# Patient Record
Sex: Male | Born: 1945
Health system: Southern US, Community
[De-identification: ages and names within clinical notes are randomized; demographics above are authoritative.]

## PROBLEM LIST (undated history)

## (undated) DIAGNOSIS — H269 Unspecified cataract: Secondary | ICD-10-CM

## (undated) DIAGNOSIS — M720 Palmar fascial fibromatosis [Dupuytren]: Secondary | ICD-10-CM

## (undated) DIAGNOSIS — I10 Essential (primary) hypertension: Secondary | ICD-10-CM

## (undated) DIAGNOSIS — K219 Gastro-esophageal reflux disease without esophagitis: Secondary | ICD-10-CM

## (undated) DIAGNOSIS — K449 Diaphragmatic hernia without obstruction or gangrene: Secondary | ICD-10-CM

## (undated) DIAGNOSIS — T7840XA Allergy, unspecified, initial encounter: Secondary | ICD-10-CM

## (undated) DIAGNOSIS — D751 Secondary polycythemia: Secondary | ICD-10-CM

## (undated) HISTORY — DX: Diaphragmatic hernia without obstruction or gangrene: K44.9

## (undated) HISTORY — PX: COLONOSCOPY: SHX174

## (undated) HISTORY — DX: Palmar fascial fibromatosis (dupuytren): M72.0

## (undated) HISTORY — DX: Allergy, unspecified, initial encounter: T78.40XA

## (undated) HISTORY — DX: Essential (primary) hypertension: I10

## (undated) HISTORY — DX: Gastro-esophageal reflux disease without esophagitis: K21.9

## (undated) HISTORY — PX: POLYPECTOMY: SHX149

## (undated) HISTORY — DX: Unspecified cataract: H26.9

## (undated) HISTORY — DX: Secondary polycythemia: D75.1

## (undated) HISTORY — PX: DUPUYTREN CONTRACTURE RELEASE: SHX1478

## (undated) HISTORY — PX: OTHER SURGICAL HISTORY: SHX169

---

## 1999-05-29 ENCOUNTER — Other Ambulatory Visit: Admission: RE | Admit: 1999-05-29 | Discharge: 1999-05-29 | Payer: Self-pay | Admitting: Gastroenterology

## 1999-05-29 ENCOUNTER — Encounter (INDEPENDENT_AMBULATORY_CARE_PROVIDER_SITE_OTHER): Payer: Self-pay

## 2003-01-31 ENCOUNTER — Ambulatory Visit (HOSPITAL_COMMUNITY): Admission: RE | Admit: 2003-01-31 | Discharge: 2003-01-31 | Payer: Self-pay | Admitting: Hematology & Oncology

## 2003-12-23 ENCOUNTER — Ambulatory Visit: Payer: Self-pay | Admitting: Hematology & Oncology

## 2003-12-28 ENCOUNTER — Ambulatory Visit: Payer: Self-pay | Admitting: Internal Medicine

## 2004-01-05 ENCOUNTER — Ambulatory Visit: Payer: Self-pay | Admitting: Internal Medicine

## 2004-03-08 ENCOUNTER — Ambulatory Visit: Payer: Self-pay | Admitting: Internal Medicine

## 2004-04-20 ENCOUNTER — Ambulatory Visit: Payer: Self-pay | Admitting: Hematology & Oncology

## 2004-05-08 ENCOUNTER — Ambulatory Visit: Payer: Self-pay | Admitting: Internal Medicine

## 2004-06-15 ENCOUNTER — Ambulatory Visit: Payer: Self-pay | Admitting: Hematology & Oncology

## 2004-07-09 ENCOUNTER — Ambulatory Visit: Payer: Self-pay | Admitting: Internal Medicine

## 2004-08-06 ENCOUNTER — Ambulatory Visit: Payer: Self-pay | Admitting: Gastroenterology

## 2004-08-13 ENCOUNTER — Ambulatory Visit: Payer: Self-pay | Admitting: Internal Medicine

## 2004-08-20 ENCOUNTER — Ambulatory Visit: Payer: Self-pay | Admitting: Gastroenterology

## 2004-08-20 ENCOUNTER — Encounter (INDEPENDENT_AMBULATORY_CARE_PROVIDER_SITE_OTHER): Payer: Self-pay | Admitting: Specialist

## 2004-08-20 ENCOUNTER — Encounter: Payer: Self-pay | Admitting: Internal Medicine

## 2005-01-18 ENCOUNTER — Ambulatory Visit: Payer: Self-pay | Admitting: Internal Medicine

## 2005-01-21 ENCOUNTER — Ambulatory Visit: Payer: Self-pay | Admitting: Hematology & Oncology

## 2005-01-25 ENCOUNTER — Ambulatory Visit: Payer: Self-pay | Admitting: Internal Medicine

## 2005-03-21 ENCOUNTER — Ambulatory Visit: Payer: Self-pay | Admitting: Internal Medicine

## 2005-03-29 ENCOUNTER — Ambulatory Visit: Payer: Self-pay | Admitting: Internal Medicine

## 2005-07-12 ENCOUNTER — Ambulatory Visit: Payer: Self-pay | Admitting: Hematology & Oncology

## 2005-07-22 LAB — CBC WITH DIFFERENTIAL/PLATELET
BASO%: 0.4 % (ref 0.0–2.0)
Eosinophils Absolute: 0.2 10*3/uL (ref 0.0–0.5)
MCHC: 34.5 g/dL (ref 32.0–35.9)
MONO#: 0.5 10*3/uL (ref 0.1–0.9)
NEUT#: 4.5 10*3/uL (ref 1.5–6.5)
RBC: 5.12 10*6/uL (ref 4.20–5.71)
RDW: 15.1 % — ABNORMAL HIGH (ref 11.2–14.6)
WBC: 6.3 10*3/uL (ref 4.0–10.0)
lymph#: 1 10*3/uL (ref 0.9–3.3)

## 2005-07-22 LAB — FERRITIN: Ferritin: 40 ng/mL (ref 22–322)

## 2005-08-05 LAB — CBC WITH DIFFERENTIAL/PLATELET
Basophils Absolute: 0 10*3/uL (ref 0.0–0.1)
EOS%: 4.5 % (ref 0.0–7.0)
Eosinophils Absolute: 0.3 10*3/uL (ref 0.0–0.5)
HCT: 44.6 % (ref 38.7–49.9)
HGB: 15.7 g/dL (ref 13.0–17.1)
MCH: 33.3 pg (ref 28.0–33.4)
MONO#: 0.7 10*3/uL (ref 0.1–0.9)
NEUT#: 4.5 10*3/uL (ref 1.5–6.5)
RDW: 14.6 % (ref 11.2–14.6)
WBC: 6.7 10*3/uL (ref 4.0–10.0)
lymph#: 1.1 10*3/uL (ref 0.9–3.3)

## 2005-08-05 LAB — FERRITIN: Ferritin: 35 ng/mL (ref 22–322)

## 2005-11-14 ENCOUNTER — Ambulatory Visit: Payer: Self-pay | Admitting: Hematology & Oncology

## 2005-11-18 LAB — CBC WITH DIFFERENTIAL/PLATELET
Basophils Absolute: 0.1 10*3/uL (ref 0.0–0.1)
Eosinophils Absolute: 0.5 10*3/uL (ref 0.0–0.5)
HCT: 49.3 % (ref 38.7–49.9)
HGB: 17.2 g/dL — ABNORMAL HIGH (ref 13.0–17.1)
MCH: 33.5 pg — ABNORMAL HIGH (ref 28.0–33.4)
MONO#: 0.8 10*3/uL (ref 0.1–0.9)
NEUT#: 4.3 10*3/uL (ref 1.5–6.5)
NEUT%: 61.6 % (ref 40.0–75.0)
RDW: 13.2 % (ref 11.2–14.6)
WBC: 6.9 10*3/uL (ref 4.0–10.0)
lymph#: 1.3 10*3/uL (ref 0.9–3.3)

## 2005-11-20 LAB — CBC WITH DIFFERENTIAL/PLATELET
EOS%: 6.9 % (ref 0.0–7.0)
Eosinophils Absolute: 0.5 10*3/uL (ref 0.0–0.5)
LYMPH%: 20.3 % (ref 14.0–48.0)
MCH: 33 pg (ref 28.0–33.4)
MCV: 91.8 fL (ref 81.6–98.0)
MONO%: 9.9 % (ref 0.0–13.0)
Platelets: 173 10*3/uL (ref 145–400)
RBC: 5.14 10*6/uL (ref 4.20–5.71)
RDW: 12.2 % (ref 11.2–14.6)

## 2005-11-27 LAB — CBC WITH DIFFERENTIAL/PLATELET
BASO%: 0.9 % (ref 0.0–2.0)
Eosinophils Absolute: 0.5 10*3/uL (ref 0.0–0.5)
LYMPH%: 21.3 % (ref 14.0–48.0)
MCHC: 35.2 g/dL (ref 32.0–35.9)
MCV: 94.3 fL (ref 81.6–98.0)
MONO%: 10.1 % (ref 0.0–13.0)
NEUT#: 4.1 10*3/uL (ref 1.5–6.5)
Platelets: 200 10*3/uL (ref 145–400)
RBC: 4.76 10*6/uL (ref 4.20–5.71)
RDW: 12.4 % (ref 11.2–14.6)
WBC: 6.8 10*3/uL (ref 4.0–10.0)

## 2006-01-15 ENCOUNTER — Ambulatory Visit: Payer: Self-pay | Admitting: Hematology & Oncology

## 2006-01-20 ENCOUNTER — Encounter: Payer: Self-pay | Admitting: Internal Medicine

## 2006-01-20 LAB — CBC WITH DIFFERENTIAL/PLATELET
BASO%: 1.1 % (ref 0.0–2.0)
EOS%: 6.3 % (ref 0.0–7.0)
MCH: 32.8 pg (ref 28.0–33.4)
MCHC: 35.9 g/dL (ref 32.0–35.9)
RDW: 11.5 % (ref 11.2–14.6)
lymph#: 1 10*3/uL (ref 0.9–3.3)

## 2006-01-27 LAB — CBC WITH DIFFERENTIAL/PLATELET
Basophils Absolute: 0 10*3/uL (ref 0.0–0.1)
EOS%: 5.3 % (ref 0.0–7.0)
Eosinophils Absolute: 0.3 10*3/uL (ref 0.0–0.5)
HGB: 15 g/dL (ref 13.0–17.1)
NEUT#: 4.3 10*3/uL (ref 1.5–6.5)
RDW: 12.7 % (ref 11.2–14.6)
lymph#: 0.9 10*3/uL (ref 0.9–3.3)

## 2006-02-03 ENCOUNTER — Ambulatory Visit: Payer: Self-pay | Admitting: Internal Medicine

## 2006-02-03 LAB — CONVERTED CEMR LAB
AST: 26 units/L (ref 0–37)
BUN: 5 mg/dL — ABNORMAL LOW (ref 6–23)
CO2: 28 meq/L (ref 19–32)
Calcium: 9.3 mg/dL (ref 8.4–10.5)
Chloride: 105 meq/L (ref 96–112)
Creatinine, Ser: 1 mg/dL (ref 0.4–1.5)
Eosinophils Relative: 5.7 % — ABNORMAL HIGH (ref 0.0–5.0)
HCT: 41.4 % (ref 39.0–52.0)
HDL: 44.5 mg/dL (ref >39.0)
Hemoglobin: 14.4 g/dL (ref 13.0–17.0)
Lymphocytes Relative: 18.6 % (ref 12.0–46.0)
MCHC: 34.7 g/dL (ref 30.0–36.0)
MCV: 94 fL (ref 78.0–100.0)
Monocytes Absolute: 0.7 10*3/uL (ref 0.2–0.7)
Monocytes Relative: 11.7 % — ABNORMAL HIGH (ref 3.0–11.0)
Neutro Abs: 3.7 10*3/uL (ref 1.4–7.7)
Neutrophils Relative %: 63.2 % (ref 43.0–77.0)
PSA: 0.63 ng/mL (ref 0.10–4.00)
RDW: 11.5 % (ref 11.5–14.6)
TSH: 0.87 microintl units/mL (ref 0.35–5.50)
Total CHOL/HDL Ratio: 4
Triglycerides: 95 mg/dL (ref 0–149)

## 2006-02-10 ENCOUNTER — Ambulatory Visit: Payer: Self-pay | Admitting: Internal Medicine

## 2006-03-12 ENCOUNTER — Ambulatory Visit: Payer: Self-pay | Admitting: Hematology & Oncology

## 2006-03-17 LAB — CBC WITH DIFFERENTIAL/PLATELET
Basophils Absolute: 0 10*3/uL (ref 0.0–0.1)
Eosinophils Absolute: 0.3 10*3/uL (ref 0.0–0.5)
HGB: 14.6 g/dL (ref 13.0–17.1)
MCV: 87.8 fL (ref 81.6–98.0)
MONO#: 0.5 10*3/uL (ref 0.1–0.9)
MONO%: 9 % (ref 0.0–13.0)
NEUT#: 3.8 10*3/uL (ref 1.5–6.5)
Platelets: 206 10*3/uL (ref 145–400)
RDW: 13.5 % (ref 11.2–14.6)

## 2006-05-15 ENCOUNTER — Ambulatory Visit: Payer: Self-pay | Admitting: Hematology & Oncology

## 2006-05-19 LAB — CBC WITH DIFFERENTIAL/PLATELET
BASO%: 0.5 % (ref 0.0–2.0)
Basophils Absolute: 0 10*3/uL (ref 0.0–0.1)
EOS%: 3.2 % (ref 0.0–7.0)
HGB: 15.1 g/dL (ref 13.0–17.1)
MCH: 29.8 pg (ref 28.0–33.4)
RBC: 5.06 10*6/uL (ref 4.20–5.71)
RDW: 16.8 % — ABNORMAL HIGH (ref 11.2–14.6)
lymph#: 1 10*3/uL (ref 0.9–3.3)

## 2006-05-19 LAB — FERRITIN: Ferritin: 14 ng/mL — ABNORMAL LOW (ref 22–322)

## 2006-08-20 ENCOUNTER — Ambulatory Visit: Payer: Self-pay | Admitting: Hematology & Oncology

## 2006-08-22 ENCOUNTER — Encounter: Payer: Self-pay | Admitting: Internal Medicine

## 2006-08-22 LAB — CBC WITH DIFFERENTIAL/PLATELET
BASO%: 0.4 % (ref 0.0–2.0)
Eosinophils Absolute: 0.3 10*3/uL (ref 0.0–0.5)
MCHC: 36.1 g/dL — ABNORMAL HIGH (ref 32.0–35.9)
MONO#: 0.8 10*3/uL (ref 0.1–0.9)
NEUT#: 3.9 10*3/uL (ref 1.5–6.5)
Platelets: 177 10*3/uL (ref 145–400)
RBC: 4.79 10*6/uL (ref 4.20–5.71)
RDW: 14.5 % (ref 11.2–14.6)
WBC: 6.1 10*3/uL (ref 4.0–10.0)
lymph#: 1.1 10*3/uL (ref 0.9–3.3)

## 2006-08-29 DIAGNOSIS — I10 Essential (primary) hypertension: Secondary | ICD-10-CM | POA: Insufficient documentation

## 2006-08-29 DIAGNOSIS — E039 Hypothyroidism, unspecified: Secondary | ICD-10-CM | POA: Insufficient documentation

## 2006-08-29 DIAGNOSIS — E785 Hyperlipidemia, unspecified: Secondary | ICD-10-CM | POA: Insufficient documentation

## 2007-01-28 ENCOUNTER — Ambulatory Visit: Payer: Self-pay | Admitting: Hematology & Oncology

## 2007-01-30 ENCOUNTER — Encounter: Payer: Self-pay | Admitting: Internal Medicine

## 2007-01-30 LAB — CBC WITH DIFFERENTIAL/PLATELET
Eosinophils Absolute: 0.3 10*3/uL (ref 0.0–0.5)
HCT: 48.9 % (ref 38.7–49.9)
LYMPH%: 19.9 % (ref 14.0–48.0)
MONO#: 0.7 10*3/uL (ref 0.1–0.9)
NEUT#: 3.9 10*3/uL (ref 1.5–6.5)
NEUT%: 62.9 % (ref 40.0–75.0)
Platelets: 187 10*3/uL (ref 145–400)
WBC: 6.3 10*3/uL (ref 4.0–10.0)
lymph#: 1.2 10*3/uL (ref 0.9–3.3)

## 2007-01-30 LAB — FERRITIN: Ferritin: 89 ng/mL (ref 22–322)

## 2007-02-03 LAB — CBC WITH DIFFERENTIAL/PLATELET
Basophils Absolute: 0.1 10*3/uL (ref 0.0–0.1)
EOS%: 3.2 % (ref 0.0–7.0)
Eosinophils Absolute: 0.2 10*3/uL (ref 0.0–0.5)
HCT: 44.3 % (ref 38.7–49.9)
HGB: 16 g/dL (ref 13.0–17.1)
MCH: 33 pg (ref 28.0–33.4)
MCV: 91.5 fL (ref 81.6–98.0)
NEUT#: 4.2 10*3/uL (ref 1.5–6.5)
NEUT%: 69.4 % (ref 40.0–75.0)
RDW: 10.9 % — ABNORMAL LOW (ref 11.2–14.6)
lymph#: 1.1 10*3/uL (ref 0.9–3.3)

## 2007-02-10 LAB — CBC WITH DIFFERENTIAL/PLATELET
BASO%: 1.2 % (ref 0.0–2.0)
Eosinophils Absolute: 0.3 10*3/uL (ref 0.0–0.5)
HCT: 41.4 % (ref 38.7–49.9)
LYMPH%: 17 % (ref 14.0–48.0)
MCHC: 36.6 g/dL — ABNORMAL HIGH (ref 32.0–35.9)
MONO#: 0.5 10*3/uL (ref 0.1–0.9)
NEUT#: 4.5 10*3/uL (ref 1.5–6.5)
Platelets: 223 10*3/uL (ref 145–400)
RBC: 4.49 10*6/uL (ref 4.20–5.71)
WBC: 6.4 10*3/uL (ref 4.0–10.0)
lymph#: 1.1 10*3/uL (ref 0.9–3.3)

## 2007-04-09 ENCOUNTER — Ambulatory Visit: Payer: Self-pay | Admitting: Internal Medicine

## 2007-04-09 LAB — CONVERTED CEMR LAB
Alkaline Phosphatase: 50 units/L (ref 39–117)
Bilirubin, Direct: 0.2 mg/dL (ref 0.0–0.3)
Blood in Urine, dipstick: NEGATIVE
Eosinophils Absolute: 0.4 10*3/uL (ref 0.0–0.6)
GFR calc Af Amer: 97 mL/min
GFR calc non Af Amer: 80 mL/min
Glucose, Urine, Semiquant: NEGATIVE
HCT: 48.5 % (ref 39.0–52.0)
HDL: 41.8 mg/dL (ref >39.0)
MCV: 94.2 fL (ref 78.0–100.0)
Monocytes Absolute: 0.7 10*3/uL (ref 0.2–0.7)
Nitrite: NEGATIVE
Platelets: 163 10*3/uL (ref 150–400)
Potassium: 4.3 meq/L (ref 3.5–5.1)
RDW: 12.4 % (ref 11.5–14.6)
Sodium: 137 meq/L (ref 135–145)
Specific Gravity, Urine: >=1.03
Total Bilirubin: 1.1 mg/dL (ref 0.3–1.2)
Total CHOL/HDL Ratio: 3.9
Triglycerides: 88 mg/dL (ref 0–149)
VLDL: 18 mg/dL (ref 0–40)
pH: 5.5

## 2007-04-16 ENCOUNTER — Ambulatory Visit: Payer: Self-pay | Admitting: Internal Medicine

## 2007-04-16 DIAGNOSIS — D45 Polycythemia vera: Secondary | ICD-10-CM | POA: Insufficient documentation

## 2007-04-16 DIAGNOSIS — M720 Palmar fascial fibromatosis [Dupuytren]: Secondary | ICD-10-CM | POA: Insufficient documentation

## 2007-04-16 LAB — CONVERTED CEMR LAB
Cholesterol, target level: 200 mg/dL
LDL Goal: 130 mg/dL

## 2007-04-22 ENCOUNTER — Encounter: Payer: Self-pay | Admitting: Internal Medicine

## 2007-04-23 ENCOUNTER — Ambulatory Visit: Payer: Self-pay | Admitting: Hematology & Oncology

## 2007-04-29 ENCOUNTER — Encounter: Payer: Self-pay | Admitting: Internal Medicine

## 2007-04-29 LAB — CBC WITH DIFFERENTIAL/PLATELET
BASO%: 0.2 % (ref 0.0–2.0)
Eosinophils Absolute: 0.3 10*3/uL (ref 0.0–0.5)
MCHC: 35.4 g/dL (ref 32.0–35.9)
MCV: 91.4 fL (ref 81.6–98.0)
MONO#: 0.6 10*3/uL (ref 0.1–0.9)
MONO%: 10.2 % (ref 0.0–13.0)
NEUT#: 3.9 10*3/uL (ref 1.5–6.5)
RBC: 5.04 10*6/uL (ref 4.20–5.71)
RDW: 13.6 % (ref 11.2–14.6)
WBC: 5.8 10*3/uL (ref 4.0–10.0)

## 2007-04-30 LAB — FERRITIN: Ferritin: 30 ng/mL (ref 22–322)

## 2007-05-06 LAB — CBC WITH DIFFERENTIAL/PLATELET
BASO%: 1.4 % (ref 0.0–2.0)
EOS%: 8 % — ABNORMAL HIGH (ref 0.0–7.0)
HCT: 43.2 % (ref 38.7–49.9)
MCH: 32.3 pg (ref 28.0–33.4)
MCHC: 35.6 g/dL (ref 32.0–35.9)
MONO#: 0.5 10*3/uL (ref 0.1–0.9)
NEUT%: 62.5 % (ref 40.0–75.0)
RDW: 14 % (ref 11.2–14.6)
WBC: 5.4 10*3/uL (ref 4.0–10.0)
lymph#: 1 10*3/uL (ref 0.9–3.3)

## 2007-06-29 ENCOUNTER — Ambulatory Visit: Payer: Self-pay | Admitting: Hematology & Oncology

## 2007-07-01 ENCOUNTER — Encounter: Payer: Self-pay | Admitting: Internal Medicine

## 2007-07-01 LAB — CBC WITH DIFFERENTIAL/PLATELET
Basophils Absolute: 0 10*3/uL (ref 0.0–0.1)
Eosinophils Absolute: 0.2 10*3/uL (ref 0.0–0.5)
HCT: 43.9 % (ref 38.7–49.9)
HGB: 15.3 g/dL (ref 13.0–17.1)
LYMPH%: 15.8 % (ref 14.0–48.0)
MCV: 92.1 fL (ref 81.6–98.0)
MONO#: 0.5 10*3/uL (ref 0.1–0.9)
NEUT#: 3.3 10*3/uL (ref 1.5–6.5)
Platelets: 157 10*3/uL (ref 145–400)
RBC: 4.76 10*6/uL (ref 4.20–5.71)
WBC: 4.8 10*3/uL (ref 4.0–10.0)

## 2007-07-23 ENCOUNTER — Encounter (INDEPENDENT_AMBULATORY_CARE_PROVIDER_SITE_OTHER): Payer: Self-pay | Admitting: Orthopedic Surgery

## 2007-07-23 ENCOUNTER — Ambulatory Visit (HOSPITAL_BASED_OUTPATIENT_CLINIC_OR_DEPARTMENT_OTHER): Admission: RE | Admit: 2007-07-23 | Discharge: 2007-07-23 | Payer: Self-pay | Admitting: Orthopedic Surgery

## 2007-09-07 ENCOUNTER — Encounter: Payer: Self-pay | Admitting: Internal Medicine

## 2007-09-23 ENCOUNTER — Encounter: Payer: Self-pay | Admitting: Internal Medicine

## 2007-09-23 ENCOUNTER — Ambulatory Visit: Payer: Self-pay | Admitting: Hematology & Oncology

## 2007-09-24 LAB — CBC WITH DIFFERENTIAL (CANCER CENTER ONLY)
BASO#: 0.1 10*3/uL (ref 0.0–0.2)
BASO%: 1.9 % (ref 0.0–2.0)
Eosinophils Absolute: 0.5 10*3/uL (ref 0.0–0.5)
HCT: 50.9 % — ABNORMAL HIGH (ref 38.7–49.9)
HGB: 17.2 g/dL — ABNORMAL HIGH (ref 13.0–17.1)
LYMPH#: 1.4 10*3/uL (ref 0.9–3.3)
MCV: 94 fL (ref 82–98)
MONO#: 0.7 10*3/uL (ref 0.1–0.9)
NEUT%: 56.9 % (ref 40.0–80.0)
RBC: 5.41 10*6/uL (ref 4.20–5.70)
WBC: 6.2 10*3/uL (ref 4.0–10.0)

## 2007-10-05 ENCOUNTER — Encounter: Payer: Self-pay | Admitting: Internal Medicine

## 2007-10-07 LAB — CBC WITH DIFFERENTIAL (CANCER CENTER ONLY)
BASO%: 1 % (ref 0.0–2.0)
Eosinophils Absolute: 0.1 10*3/uL (ref 0.0–0.5)
LYMPH#: 1 10*3/uL (ref 0.9–3.3)
MONO#: 0.4 10*3/uL (ref 0.1–0.9)
Platelets: 174 10*3/uL (ref 145–400)
RBC: 5.13 10*6/uL (ref 4.20–5.70)
RDW: 11.6 % (ref 10.5–14.6)
WBC: 6.3 10*3/uL (ref 4.0–10.0)

## 2007-10-21 ENCOUNTER — Encounter: Payer: Self-pay | Admitting: Internal Medicine

## 2007-12-02 ENCOUNTER — Ambulatory Visit: Payer: Self-pay | Admitting: Hematology & Oncology

## 2007-12-03 LAB — CBC WITH DIFFERENTIAL (CANCER CENTER ONLY)
BASO%: 1.4 % (ref 0.0–2.0)
Eosinophils Absolute: 0.5 10*3/uL (ref 0.0–0.5)
LYMPH%: 24.1 % (ref 14.0–48.0)
MCH: 31.5 pg (ref 28.0–33.4)
MCV: 94 fL (ref 82–98)
MONO#: 0.5 10*3/uL (ref 0.1–0.9)
MONO%: 8.8 % (ref 0.0–13.0)
NEUT#: 3.1 10*3/uL (ref 1.5–6.5)
Platelets: 145 10*3/uL (ref 145–400)
RBC: 5.09 10*6/uL (ref 4.20–5.70)
RDW: 10.9 % (ref 10.5–14.6)
WBC: 5.4 10*3/uL (ref 4.0–10.0)

## 2007-12-23 ENCOUNTER — Ambulatory Visit: Payer: Self-pay | Admitting: Internal Medicine

## 2007-12-23 LAB — CONVERTED CEMR LAB
ALT: 27 units/L (ref 0–53)
Albumin: 3.8 g/dL (ref 3.5–5.2)
Cholesterol: 144 mg/dL (ref 0–200)
HCT: 45.2 % (ref 39.0–52.0)
Hemoglobin: 15.6 g/dL (ref 13.0–17.0)
MCHC: 34.4 g/dL (ref 30.0–36.0)
Monocytes Absolute: 0.7 10*3/uL (ref 0.1–1.0)
Monocytes Relative: 12.5 % — ABNORMAL HIGH (ref 3.0–12.0)
Neutro Abs: 3.5 10*3/uL (ref 1.4–7.7)
RDW: 12.4 % (ref 11.5–14.6)
Total Bilirubin: 1 mg/dL (ref 0.3–1.2)
Total CHOL/HDL Ratio: 4.2
Triglycerides: 79 mg/dL (ref 0–149)

## 2008-01-05 ENCOUNTER — Ambulatory Visit: Payer: Self-pay | Admitting: Internal Medicine

## 2008-01-27 ENCOUNTER — Ambulatory Visit: Payer: Self-pay | Admitting: Hematology & Oncology

## 2008-01-28 ENCOUNTER — Encounter: Payer: Self-pay | Admitting: Internal Medicine

## 2008-01-28 LAB — CBC WITH DIFFERENTIAL (CANCER CENTER ONLY)
EOS%: 12.7 % — ABNORMAL HIGH (ref 0.0–7.0)
LYMPH#: 0.9 10*3/uL (ref 0.9–3.3)
MCH: 29.8 pg (ref 28.0–33.4)
MCHC: 33.4 g/dL (ref 32.0–35.9)
MONO%: 6.6 % (ref 0.0–13.0)
NEUT#: 3.2 10*3/uL (ref 1.5–6.5)
Platelets: 186 10*3/uL (ref 145–400)
RBC: 5.01 10*6/uL (ref 4.20–5.70)

## 2008-03-16 ENCOUNTER — Ambulatory Visit: Payer: Self-pay | Admitting: Hematology & Oncology

## 2008-03-17 LAB — CBC WITH DIFFERENTIAL (CANCER CENTER ONLY)
BASO#: 0 10*3/uL (ref 0.0–0.2)
BASO%: 0.5 % (ref 0.0–2.0)
EOS%: 9.9 % — ABNORMAL HIGH (ref 0.0–7.0)
HGB: 14.9 g/dL (ref 13.0–17.1)
MCH: 29.2 pg (ref 28.0–33.4)
MCHC: 32.6 g/dL (ref 32.0–35.9)
MONO%: 8.6 % (ref 0.0–13.0)
NEUT#: 2.9 10*3/uL (ref 1.5–6.5)
RDW: 13.2 % (ref 10.5–14.6)

## 2008-04-14 ENCOUNTER — Ambulatory Visit: Payer: Self-pay | Admitting: Internal Medicine

## 2008-04-14 LAB — CONVERTED CEMR LAB
AST: 23 units/L (ref 0–37)
Alkaline Phosphatase: 51 units/L (ref 39–117)
BUN: 7 mg/dL (ref 6–23)
Basophils Relative: 0.7 % (ref 0.0–3.0)
Bilirubin, Direct: 0 mg/dL (ref 0.0–0.3)
CO2: 31 meq/L (ref 19–32)
Chloride: 105 meq/L (ref 96–112)
Cholesterol: 173 mg/dL (ref 0–200)
Eosinophils Absolute: 0.4 10*3/uL (ref 0.0–0.7)
Glucose, Bld: 96 mg/dL (ref 70–99)
Glucose, Urine, Semiquant: NEGATIVE
HCT: 43.2 % (ref 39.0–52.0)
Hemoglobin: 14.8 g/dL (ref 13.0–17.0)
MCV: 88.6 fL (ref 78.0–100.0)
Platelets: 153 10*3/uL (ref 150.0–400.0)
Potassium: 4.6 meq/L (ref 3.5–5.1)
RDW: 14.9 % — ABNORMAL HIGH (ref 11.5–14.6)
Specific Gravity, Urine: 1.02
Total Protein: 7.1 g/dL (ref 6.0–8.3)
pH: 6.5

## 2008-04-22 ENCOUNTER — Ambulatory Visit: Payer: Self-pay | Admitting: Internal Medicine

## 2008-05-18 ENCOUNTER — Ambulatory Visit: Payer: Self-pay | Admitting: Hematology & Oncology

## 2008-05-19 ENCOUNTER — Encounter: Payer: Self-pay | Admitting: Internal Medicine

## 2008-05-19 LAB — CBC WITH DIFFERENTIAL (CANCER CENTER ONLY)
BASO#: 0 10*3/uL (ref 0.0–0.2)
BASO%: 0.6 % (ref 0.0–2.0)
EOS%: 8 % — ABNORMAL HIGH (ref 0.0–7.0)
HCT: 44.6 % (ref 38.7–49.9)
HGB: 15.1 g/dL (ref 13.0–17.1)
LYMPH#: 1 10*3/uL (ref 0.9–3.3)
LYMPH%: 21.9 % (ref 14.0–48.0)
MCH: 29.6 pg (ref 28.0–33.4)
MCHC: 33.9 g/dL (ref 32.0–35.9)
MCV: 87 fL (ref 82–98)
MONO%: 7.3 % (ref 0.0–13.0)
NEUT%: 62.2 % (ref 40.0–80.0)
RDW: 13.5 % (ref 10.5–14.6)

## 2008-09-21 ENCOUNTER — Ambulatory Visit: Payer: Self-pay | Admitting: Hematology & Oncology

## 2008-09-22 ENCOUNTER — Encounter: Payer: Self-pay | Admitting: Internal Medicine

## 2008-09-22 LAB — CBC WITH DIFFERENTIAL (CANCER CENTER ONLY)
BASO#: 0 10*3/uL (ref 0.0–0.2)
Eosinophils Absolute: 0.4 10*3/uL (ref 0.0–0.5)
HCT: 41 % (ref 38.7–49.9)
HGB: 13.9 g/dL (ref 13.0–17.1)
LYMPH#: 1.1 10*3/uL (ref 0.9–3.3)
LYMPH%: 27.1 % (ref 14.0–48.0)
MCV: 87 fL (ref 82–98)
MONO#: 0.4 10*3/uL (ref 0.1–0.9)
NEUT%: 51.6 % (ref 40.0–80.0)
RDW: 14.4 % (ref 10.5–14.6)
WBC: 4.1 10*3/uL (ref 4.0–10.0)

## 2008-09-22 LAB — FERRITIN: Ferritin: 10 ng/mL — ABNORMAL LOW (ref 22–322)

## 2008-10-14 ENCOUNTER — Ambulatory Visit: Payer: Self-pay | Admitting: Internal Medicine

## 2008-10-14 LAB — CONVERTED CEMR LAB
Basophils Relative: 0.8 % (ref 0.0–3.0)
Eosinophils Relative: 7.1 % — ABNORMAL HIGH (ref 0.0–5.0)
HCT: 41.2 % (ref 39.0–52.0)
Lymphs Abs: 1 10*3/uL (ref 0.7–4.0)
MCV: 90 fL (ref 78.0–100.0)
Monocytes Absolute: 0.6 10*3/uL (ref 0.1–1.0)
Platelets: 171 10*3/uL (ref 150.0–400.0)
WBC: 6 10*3/uL (ref 4.5–10.5)

## 2008-10-20 ENCOUNTER — Ambulatory Visit: Payer: Self-pay | Admitting: Internal Medicine

## 2009-01-24 ENCOUNTER — Ambulatory Visit: Payer: Self-pay | Admitting: Hematology & Oncology

## 2009-02-09 LAB — CBC WITH DIFFERENTIAL (CANCER CENTER ONLY)
BASO%: 0.6 % (ref 0.0–2.0)
EOS%: 7.6 % — ABNORMAL HIGH (ref 0.0–7.0)
LYMPH#: 1.4 10*3/uL (ref 0.9–3.3)
MCHC: 33.8 g/dL (ref 32.0–35.9)
MONO#: 0.4 10*3/uL (ref 0.1–0.9)
NEUT#: 3.1 10*3/uL (ref 1.5–6.5)
Platelets: 184 10*3/uL (ref 145–400)
RDW: 12.8 % (ref 10.5–14.6)
WBC: 5.4 10*3/uL (ref 4.0–10.0)

## 2009-02-15 LAB — FERRITIN: Ferritin: 16 ng/mL — ABNORMAL LOW (ref 22–322)

## 2009-05-18 ENCOUNTER — Ambulatory Visit: Payer: Self-pay | Admitting: Internal Medicine

## 2009-05-18 LAB — CONVERTED CEMR LAB
ALT: 25 units/L (ref 0–53)
AST: 24 units/L (ref 0–37)
Creatinine, Ser: 0.8 mg/dL (ref 0.4–1.5)
Eosinophils Relative: 6.6 % — ABNORMAL HIGH (ref 0.0–5.0)
Glucose, Urine, Semiquant: NEGATIVE
HCT: 39.7 % (ref 39.0–52.0)
Hemoglobin: 13.3 g/dL (ref 13.0–17.0)
LDL Cholesterol: 91 mg/dL (ref 0–99)
Lymphs Abs: 0.9 10*3/uL (ref 0.7–4.0)
Monocytes Relative: 12.1 % — ABNORMAL HIGH (ref 3.0–12.0)
Neutro Abs: 3.2 10*3/uL (ref 1.4–7.7)
Platelets: 194 10*3/uL (ref 150.0–400.0)
Potassium: 4.8 meq/L (ref 3.5–5.1)
Protein, U semiquant: NEGATIVE
Sodium: 141 meq/L (ref 135–145)
TSH: 0.83 microintl units/mL (ref 0.35–5.50)
Total Bilirubin: 0.7 mg/dL (ref 0.3–1.2)
Total CHOL/HDL Ratio: 3
Urobilinogen, UA: 0.2
VLDL: 13.6 mg/dL (ref 0.0–40.0)
WBC Urine, dipstick: NEGATIVE
WBC: 5.1 10*3/uL (ref 4.5–10.5)

## 2009-05-24 ENCOUNTER — Ambulatory Visit: Payer: Self-pay | Admitting: Hematology & Oncology

## 2009-05-25 ENCOUNTER — Ambulatory Visit: Payer: Self-pay | Admitting: Internal Medicine

## 2009-07-07 ENCOUNTER — Encounter (INDEPENDENT_AMBULATORY_CARE_PROVIDER_SITE_OTHER): Payer: Self-pay | Admitting: *Deleted

## 2009-08-14 ENCOUNTER — Encounter (INDEPENDENT_AMBULATORY_CARE_PROVIDER_SITE_OTHER): Payer: Self-pay | Admitting: *Deleted

## 2009-09-27 ENCOUNTER — Encounter (INDEPENDENT_AMBULATORY_CARE_PROVIDER_SITE_OTHER): Payer: Self-pay | Admitting: *Deleted

## 2009-09-28 ENCOUNTER — Ambulatory Visit: Payer: Self-pay | Admitting: Gastroenterology

## 2009-10-12 ENCOUNTER — Ambulatory Visit: Payer: Self-pay | Admitting: Gastroenterology

## 2009-10-12 LAB — HM COLONOSCOPY

## 2009-10-17 ENCOUNTER — Encounter: Payer: Self-pay | Admitting: Gastroenterology

## 2009-11-23 ENCOUNTER — Ambulatory Visit: Payer: Self-pay | Admitting: Internal Medicine

## 2009-11-23 LAB — CONVERTED CEMR LAB
BUN: 8 mg/dL (ref 6–23)
Basophils Absolute: 0 10*3/uL (ref 0.0–0.1)
Calcium: 10 mg/dL (ref 8.4–10.5)
Creatinine, Ser: 0.8 mg/dL (ref 0.4–1.5)
Eosinophils Absolute: 0.6 10*3/uL (ref 0.0–0.7)
GFR calc non Af Amer: 97.44 mL/min (ref >60)
Hemoglobin: 15 g/dL (ref 13.0–17.0)
Lymphocytes Relative: 18.5 % (ref 12.0–46.0)
Lymphs Abs: 1.1 10*3/uL (ref 0.7–4.0)
MCHC: 34.4 g/dL (ref 30.0–36.0)
MCV: 89.7 fL (ref 78.0–100.0)
Monocytes Absolute: 0.8 10*3/uL (ref 0.1–1.0)
Neutro Abs: 3.4 10*3/uL (ref 1.4–7.7)
Potassium: 4.8 meq/L (ref 3.5–5.1)
RDW: 15.2 % — ABNORMAL HIGH (ref 11.5–14.6)

## 2009-12-13 ENCOUNTER — Ambulatory Visit: Payer: Self-pay | Admitting: Internal Medicine

## 2009-12-13 DIAGNOSIS — B029 Zoster without complications: Secondary | ICD-10-CM | POA: Insufficient documentation

## 2010-02-13 NOTE — Assessment & Plan Note (Signed)
Summary: 6 month rov/njr   Vital Signs:  Patient profile:   65 year old male Height:      71 inches Weight:      182 pounds BMI:     25.48 Temp:     98.2 degrees F oral Pulse rate:   72 / minute Resp:     14 per minute BP sitting:   130 / 76  (left arm)  Vitals Entered By: Willy Eddy, LPN (November 23, 2009 11:41 AM) CC: 6 month roa, Hypertension Management Is Patient Diabetic? No   Primary Care Provider:  Stacie Glaze MD  CC:  6 month roa and Hypertension Management.  History of Present Illness: weight up retired planned exercize needs dicussed the 1000 step program   Hypertension History:      He denies headache, chest pain, palpitations, dyspnea with exertion, orthopnea, PND, peripheral edema, visual symptoms, neurologic problems, syncope, and side effects from treatment.        Positive major cardiovascular risk factors include male age 64 years old or older, hyperlipidemia, and hypertension.  Negative major cardiovascular risk factors include non-tobacco-user status.     Preventive Screening-Counseling & Management  Alcohol-Tobacco     Smoking Status: quit     Year Quit: 1988     Tobacco Counseling: not indicated; no tobacco use  Problems Prior to Update: 1)  Dupuytren's Contracture, Right  (ICD-728.6) 2)  Dupuytren's Contracture, Left  (ICD-728.6) 3)  Polycythemia Rubra Vera  (ICD-238.4) 4)  Physical Examination  (ICD-V70.0) 5)  Hypertension  (ICD-401.9) 6)  Hypothyroidism  (ICD-244.9) 7)  Hyperlipidemia  (ICD-272.4) 8)  Family History of Colon Ca 1st Degree Relative <60  (ICD-V16.0)  Medications Prior to Update: 1)  Adult Aspirin Low Strength 81 Mg  Tbdp (Aspirin) .... Once Daily 2)  Viagra 100 Mg  Tabs (Sildenafil Citrate) .... As Needed 3)  Verapamil Hcl Cr 180 Mg  Cp24 (Verapamil Hcl) .... Once Daily 4)  Benazepril Hcl 10 Mg  Tabs (Benazepril Hcl) .... Once Daily  Current Medications (verified): 1)  Adult Aspirin Low Strength 81 Mg  Tbdp  (Aspirin) .... Once Daily 2)  Viagra 100 Mg  Tabs (Sildenafil Citrate) .... As Needed 3)  Verapamil Hcl Cr 180 Mg  Cp24 (Verapamil Hcl) .... Once Daily 4)  Benazepril Hcl 10 Mg  Tabs (Benazepril Hcl) .... Once Daily  Allergies (verified): No Known Drug Allergies  Past History:  Family History: Last updated: 08/29/2006 Family History of Colon CA 1st degree relative <60 Family History of Stroke F 1st degree relative <60  Social History: Last updated: 08/29/2006 Occupation: Single Former Smoker  Risk Factors: Smoking Status: quit (11/23/2009)  Past medical, surgical, family and social histories (including risk factors) reviewed, and no changes noted (except as noted below).  Past Medical History: Reviewed history from 04/16/2007 and no changes required. Hiatal Hernia Hypertension polycyemia vera  Past Surgical History: R Knee Arthoscopic Colonoscopy Cataract extraction left eye  Family History: Reviewed history from 08/29/2006 and no changes required. Family History of Colon CA 1st degree relative <60 Family History of Stroke F 1st degree relative <60  Social History: Reviewed history from 08/29/2006 and no changes required. Occupation: Single Former Smoker  Review of Systems  The patient denies anorexia, fever, weight loss, weight gain, vision loss, decreased hearing, hoarseness, chest pain, syncope, dyspnea on exertion, peripheral edema, prolonged cough, headaches, hemoptysis, abdominal pain, melena, hematochezia, severe indigestion/heartburn, hematuria, incontinence, genital sores, muscle weakness, suspicious skin lesions, transient blindness, difficulty walking,  depression, unusual weight change, abnormal bleeding, enlarged lymph nodes, angioedema, and breast masses.         Flu Vaccine Consent Questions     Do you have a history of severe allergic reactions to this vaccine? no    Any prior history of allergic reactions to egg and/or gelatin? no    Do you have  a sensitivity to the preservative Thimersol? no    Do you have a past history of Guillan-Barre Syndrome? no    Do you currently have an acute febrile illness? no    Have you ever had a severe reaction to latex? no    Vaccine information given and explained to patient? yes    Are you currently pregnant? no    Lot Number:[id number]   Exp Date:07/14/2010   Site Given  Left Deltoid IM     Physical Exam  General:  Well-developed,well-nourished,in no acute distress; alert,appropriate and cooperative throughout examination Eyes:  vision grossly intact, pupils equal, and pupils round.   Ears:  R ear normal, L ear normal, and no external deformities.   Nose:  no external deformity and no nasal discharge.   Mouth:  no posterior lymphoid hypertrophy and no postnasal drip.   Neck:  No deformities, masses, or tenderness noted. Lungs:  normal respiratory effort, normal breath sounds, no dullness, and no fremitus.   Heart:  normal rate, regular rhythm, no gallop, and no rub.   Abdomen:  Bowel sounds positive,abdomen soft and non-tender without masses, organomegaly or hernias noted.   Impression & Recommendations:  Problem # 1:  POLYCYTHEMIA RUBRA VERA (ICD-238.4)  Problem # 2:  HYPERTENSION (ICD-401.9)  His updated medication list for this problem includes:    Verapamil Hcl Cr 180 Mg Cp24 (Verapamil hcl) ..... Once daily    Benazepril Hcl 10 Mg Tabs (Benazepril hcl) ..... Once daily  Orders: TLB-BMP (Basic Metabolic Panel-BMET) (80048-METABOL)  BP today: 130/76 Prior BP: 120/70 (05/25/2009)  10 Yr Risk Heart Disease: 9 % Prior 10 Yr Risk Heart Disease: 18 % (04/22/2008)  Labs Reviewed: K+: 4.8 (05/18/2009) Creat: : 0.8 (05/18/2009)   Chol: 157 (05/18/2009)   HDL: 52.20 (05/18/2009)   LDL: 91 (05/18/2009)   TG: 68.0 (05/18/2009)  Problem # 3:  HYPOTHYROIDISM (ICD-244.9)  Orders: TLB-TSH (Thyroid Stimulating Hormone) (84443-TSH)  Problem # 4:  HYPERLIPIDEMIA  (ICD-272.4)  Orders: Venipuncture ([address]) TLB-CBC Platelet - w/Differential (85025-CBCD)  Labs Reviewed: SGOT: 24 (05/18/2009)   SGPT: 25 (05/18/2009)  Lipid Goals: Chol Goal: 200 (04/16/2007)   HDL Goal: 40 (04/16/2007)   LDL Goal: 130 (04/16/2007)   TG Goal: 150 (04/16/2007)  10 Yr Risk Heart Disease: 9 % Prior 10 Yr Risk Heart Disease: 18 % (04/22/2008)   HDL:52.20 (05/18/2009), 44.70 (04/14/2008)  LDL:91 (05/18/2009), 112 (60/45/4098)  Chol:157 (05/18/2009), 173 (04/14/2008)  Trig:68.0 (05/18/2009), 80.0 (04/14/2008)  Complete Medication List: 1)  Adult Aspirin Low Strength 81 Mg Tbdp (Aspirin) .... Once daily 2)  Viagra 100 Mg Tabs (Sildenafil citrate) .... As needed 3)  Verapamil Hcl Cr 180 Mg Cp24 (Verapamil hcl) .... Once daily 4)  Benazepril Hcl 10 Mg Tabs (Benazepril hcl) .... Once daily  Other Orders: Flu Vaccine 24yrs + MEDICARE PATIENTS (J1914) Administration Flu vaccine - MCR (N8295)  Hypertension Assessment/Plan:      The patient's hypertensive risk group is category B: At least one risk factor (excluding diabetes) with no target organ damage.  His calculated 10 year risk of coronary heart disease is 9 %.  Today's blood pressure is  130/76.  His blood pressure goal is < 140/90.  Patient Instructions: 1)  It is important that you exercise regularly at least 20 minutes 5 times a week. If you develop chest pain, have severe difficulty breathing, or feel very tired , stop exercising immediately and seek medical attention. 2)  6  months medicare wellness exam fasting for 30 min   Orders Added: 1)  Flu Vaccine 36yrs + MEDICARE PATIENTS [Q2039] 2)  Administration Flu vaccine - MCR [G0008] 3)  Venipuncture [[address]] 4)  TLB-CBC Platelet - w/Differential [85025-CBCD] 5)  TLB-TSH (Thyroid Stimulating Hormone) [84443-TSH] 6)  TLB-BMP (Basic Metabolic Panel-BMET) [80048-METABOL] 7)  Est. Patient Level IV [60454]  Appended Document: Orders Update     Clinical Lists  Changes  Orders: Added new Service order of Specimen Handling (09811) - Signed

## 2010-02-13 NOTE — Assessment & Plan Note (Signed)
Summary: LEFT EYE SWOLLEN OK PER DOC/NJR   Vital Signs:  Patient profile:   65 year old male Height:      71 inches Weight:      182 pounds BMI:     25.48 Temp:     98.6 degrees F oral Pulse rate:   72 / minute Resp:     14 per minute BP sitting:   130 / 80  (left arm)  Vitals Entered By: Willy Eddy, LPN (December 13, 2009 1:11 PM) CC: c/o shingles looking lesions above left eye with pain in scalp, Hypertension Management Is Patient Diabetic? No   Primary Care Provider:  Stacie Glaze MD  CC:  c/o shingles looking lesions above left eye with pain in scalp and Hypertension Management.  History of Present Illness: rash on face and in eye on left pain in scalp and face rash appears to be shingles tearing in left red and redness with itching  Hypertension History:      He denies headache, chest pain, palpitations, dyspnea with exertion, orthopnea, PND, peripheral edema, visual symptoms, neurologic problems, syncope, and side effects from treatment.        Positive major cardiovascular risk factors include male age 50 years old or older, hyperlipidemia, and hypertension.  Negative major cardiovascular risk factors include non-tobacco-user status.     Preventive Screening-Counseling & Management  Alcohol-Tobacco     Smoking Status: quit     Year Quit: 1988     Tobacco Counseling: not indicated; no tobacco use  Problems Prior to Update: 1)  Dupuytren's Contracture, Right  (ICD-728.6) 2)  Dupuytren's Contracture, Left  (ICD-728.6) 3)  Polycythemia Rubra Vera  (ICD-238.4) 4)  Physical Examination  (ICD-V70.0) 5)  Hypertension  (ICD-401.9) 6)  Hypothyroidism  (ICD-244.9) 7)  Hyperlipidemia  (ICD-272.4) 8)  Family History of Colon Ca 1st Degree Relative <60  (ICD-V16.0)  Current Problems (verified): 1)  Dupuytren's Contracture, Right  (ICD-728.6) 2)  Dupuytren's Contracture, Left  (ICD-728.6) 3)  Polycythemia Rubra Vera  (ICD-238.4) 4)  Physical Examination   (ICD-V70.0) 5)  Hypertension  (ICD-401.9) 6)  Hypothyroidism  (ICD-244.9) 7)  Hyperlipidemia  (ICD-272.4) 8)  Family History of Colon Ca 1st Degree Relative <60  (ICD-V16.0)  Medications Prior to Update: 1)  Adult Aspirin Low Strength 81 Mg  Tbdp (Aspirin) .... Once Daily 2)  Viagra 100 Mg  Tabs (Sildenafil Citrate) .... As Needed 3)  Verapamil Hcl Cr 180 Mg  Cp24 (Verapamil Hcl) .... Once Daily 4)  Benazepril Hcl 10 Mg  Tabs (Benazepril Hcl) .... Once Daily  Current Medications (verified): 1)  Adult Aspirin Low Strength 81 Mg  Tbdp (Aspirin) .... Once Daily 2)  Viagra 100 Mg  Tabs (Sildenafil Citrate) .... As Needed 3)  Verapamil Hcl Cr 180 Mg  Cp24 (Verapamil Hcl) .... Once Daily 4)  Benazepril Hcl 10 Mg  Tabs (Benazepril Hcl) .... Once Daily  Allergies (verified): No Known Drug Allergies  Past History:  Family History: Last updated: 08/29/2006 Family History of Colon CA 1st degree relative <60 Family History of Stroke F 1st degree relative <60  Social History: Last updated: 08/29/2006 Occupation: Single Former Smoker  Risk Factors: Smoking Status: quit (12/13/2009)  Past medical, surgical, family and social histories (including risk factors) reviewed, and no changes noted (except as noted below).  Past Medical History: Reviewed history from 04/16/2007 and no changes required. Hiatal Hernia Hypertension polycyemia vera  Past Surgical History: Reviewed history from 11/23/2009 and no changes required. R Knee  Arthoscopic Colonoscopy Cataract extraction left eye  Family History: Reviewed history from 08/29/2006 and no changes required. Family History of Colon CA 1st degree relative <60 Family History of Stroke F 1st degree relative <60  Social History: Reviewed history from 08/29/2006 and no changes required. Occupation: Single Former Smoker  Review of Systems  The patient denies anorexia, fever, weight loss, weight gain, vision loss, decreased hearing,  hoarseness, chest pain, syncope, dyspnea on exertion, peripheral edema, prolonged cough, headaches, hemoptysis, abdominal pain, melena, hematochezia, severe indigestion/heartburn, hematuria, incontinence, genital sores, muscle weakness, suspicious skin lesions, transient blindness, difficulty walking, depression, unusual weight change, abnormal bleeding, enlarged lymph nodes, angioedema, breast masses, and testicular masses.         no vision changes  Physical Exam  General:  Well-developed,well-nourished,in no acute distress; alert,appropriate and cooperative throughout examination Eyes:  conjunctival injection.   Ears:  R ear normal and L ear normal.   Nose:  no external deformity and no nasal discharge.   Neck:  No deformities, masses, or tenderness noted. Lungs:  normal respiratory effort, normal breath sounds, no dullness, and no fremitus.   Heart:  normal rate, regular rhythm, no gallop, and no rub.   Abdomen:  Bowel sounds positive,abdomen soft and non-tender without masses, organomegaly or hernias noted. Msk:  calcified contractio=ures in right hand, surgical scar on left palm Skin:  vesicular rash.  on face   Impression & Recommendations:  Problem # 1:  HERPES ZOSTER (ICD-053.9) valacyclovire 1 gm three times a day for 10 days due to ocular involvment if visual changes occur whould refer to opthamology educationa material given I have spent greater that 30 min face to face evaluating this patient  Problem # 2:  HYPERTENSION (ICD-401.9)  His updated medication list for this problem includes:    Verapamil Hcl Cr 180 Mg Cp24 (Verapamil hcl) ..... Once daily    Benazepril Hcl 10 Mg Tabs (Benazepril hcl) ..... Once daily  BP today: 130/80 Prior BP: 130/76 (11/23/2009)  Prior 10 Yr Risk Heart Disease: 9 % (11/23/2009)  Labs Reviewed: K+: 4.8 (11/23/2009) Creat: : 0.8 (11/23/2009)   Chol: 157 (05/18/2009)   HDL: 52.20 (05/18/2009)   LDL: 91 (05/18/2009)   TG: 68.0  (05/18/2009)  Complete Medication List: 1)  Adult Aspirin Low Strength 81 Mg Tbdp (Aspirin) .... Once daily 2)  Viagra 100 Mg Tabs (Sildenafil citrate) .... As needed 3)  Verapamil Hcl Cr 180 Mg Cp24 (Verapamil hcl) .... Once daily 4)  Benazepril Hcl 10 Mg Tabs (Benazepril hcl) .... Once daily 5)  Valacyclovir Hcl 1 Gm Tabs (Valacyclovir hcl) .... One by mouth three times a day for 10 days  Hypertension Assessment/Plan:      The patient's hypertensive risk group is category B: At least one risk factor (excluding diabetes) with no target organ damage.  His calculated 10 year risk of coronary heart disease is 9 %.  Today's blood pressure is 130/80.  His blood pressure goal is < 140/90. Prescriptions: VALACYCLOVIR HCL 1 GM TABS (VALACYCLOVIR HCL) one by mouth three times a day for 10 days  #30 x 0   Entered and Authorized by:   Stacie Glaze MD   Signed by:   Stacie Glaze MD on 12/13/2009   Method used:   [facility]ically to        [facility]  [address] [id number]       [address]       [facility]  [place], [place]  [address]       Ph: [phone] or [phone]       Fax: [phone]   RxID:   [id number]    Orders Added: 1)  Est. Patient Level IV [27253]

## 2010-02-13 NOTE — Procedures (Signed)
Summary: Colonoscopy  Patient: Noah King Note: All result statuses are Final unless otherwise noted.  Tests: (1) Colonoscopy (COL)   COL Colonoscopy           DONE     [facility]     [address]     [place], [place]  [address]           COLONOSCOPY PROCEDURE REPORT     PATIENT:  Noah King, Noah King  MR#:  [id number]     BIRTHDATE:  [birth year], 63 yrs. old  GENDER:  male     ENDOSCOPIST:  Judie Petit T. Russella Dar, MD, [facility]           PROCEDURE DATE:  10/12/2009     PROCEDURE:  Colonoscopy with snare polypectomy     ASA CLASS:  Class II     INDICATIONS:  1) surveillance and high-risk screening  2) history     of adenomatous colon polyps, 08/2004 3) family history of colon     cancer: father with colon cancer.     MEDICATIONS:   Fentanyl 50 mcg IV, Versed 8 mg IV     DESCRIPTION OF PROCEDURE:   After the risks benefits and     alternatives of the procedure were thoroughly explained, informed     consent was obtained.  Digital rectal exam was performed and     revealed no abnormalities.   The LB [id number] endoscope     was introduced through the anus and advanced to the cecum, which     was identified by both the appendix and ileocecal valve, without     limitations.  The quality of the prep was excellent, using     MoviPrep.  The instrument was then slowly withdrawn as the colon     was fully examined.     <<PROCEDUREIMAGES>>     FINDINGS:  A sessile polyp was found in the ascending colon. It     was 5 mm in size. Polyp was snared without cautery. Retrieval was     successful.  A sessile polyp was found in the descending colon. It     was 5 mm in size. Polyp was snared without cautery. Retrieval was     successful.  A sessile polyp was found in the sigmoid colon. It     was 5 mm in size. Polyp was snared without cautery. Retrieval was     successful. Mild diverticulosis was found in the sigmoid colon.     This was otherwise a normal examination of the colon.  Retroflexed     views in the rectum revealed no abnormalities. The time to cecum =     6.5  minutes. The scope was then withdrawn (time =  10.33  min)     from the patient and the procedure completed.     COMPLICATIONS:  None           ENDOSCOPIC IMPRESSION:     1) 5 mm sessile polyp in the ascending colon     2) 5 mm sessile polyp in the descending colon     3) 5 mm sessile polyp in the sigmoid colon     4) Mild diverticulosis in the sigmoid colon     RECOMMENDATIONS:     1) Await pathology results     2) High fiber diet with liberal fluid intake.     3) Repeat Colonoscopy in 5 years pending pathology review.  Venita Lick. Russella Dar, MD, [facility]           CC: Stacie Glaze, MD           n.     Rosalie DoctorVenita Lick. Stark at 10/12/2009 11:08 AM           Garnet Koyanagi, [id number]  Note: An exclamation mark (!) indicates a result that was not dispersed into the flowsheet. Document Creation Date: 10/12/2009 3:33 PM _______________________________________________________________________  (1) Order result status: Final Collection or observation date-time: 10/12/2009 11:02 Requested date-time:  Receipt date-time:  Reported date-time:  Referring Physician:   Ordering Physician: Claudette Head [phone]) Specimen Source:  Source: Launa Grill Order Number: [id number] Lab site:   Appended Document: Colonoscopy     Procedures Next Due Date:    Colonoscopy: 09/2014

## 2010-02-13 NOTE — Miscellaneous (Signed)
Summary: recall col....em  Clinical Lists Changes  Medications: Added new medication of MOVIPREP 100 GM  SOLR (PEG-KCL-NACL-NASULF-NA ASC-C) As directed - Signed Rx of MOVIPREP 100 GM  SOLR (PEG-KCL-NACL-NASULF-NA ASC-C) As directed;  #1 x 0;  Signed;  Entered by: Clide Cliff RN;  Authorized by: Meryl Dare MD [facility];  Method used: [facility]  [address] [id number] [address], [place], [place]  [address], Ph: [phone] or [phone], Fax: [phone] Observations: Added new observation of ALLERGY REV: Done (09/28/2009 10:35)    Prescriptions: MOVIPREP 100 GM  SOLR (PEG-KCL-NACL-NASULF-NA ASC-C) As directed  #1 x 0   Entered by:   Clide Cliff RN   Authorized by:   Meryl Dare MD [facility]   Signed by:   Clide Cliff RN on 09/28/2009   Method used:   [facility]  [address] [id number]       [address]       [place], [place]  [address]       Ph: [phone] or [phone]       Fax: [phone]   RxID:   [id number]

## 2010-02-13 NOTE — Assessment & Plan Note (Signed)
Summary: CPX/MM/rsh bmp//lh   Vital Signs:  Patient profile:   65 year old male Height:      71 inches Weight:      178 pounds BMI:     24.92 Temp:     98.2 degrees F oral Pulse rate:   72 / minute Resp:     14 per minute BP sitting:   120 / 70  (left arm)  Vitals Entered By: Willy Eddy, LPN (May 25, 2009 11:01 AM) CC: cpx   CC:  cpx.  Preventive Screening-Counseling & Management  Alcohol-Tobacco     Smoking Status: quit  Current Problems (verified): 1)  Dupuytren's Contracture, Right  (ICD-728.6) 2)  Dupuytren's Contracture, Left  (ICD-728.6) 3)  Polycythemia Rubra Vera  (ICD-238.4) 4)  Physical Examination  (ICD-V70.0) 5)  Hypertension  (ICD-401.9) 6)  Hypothyroidism  (ICD-244.9) 7)  Hyperlipidemia  (ICD-272.4) 8)  Family History of Colon Ca 1st Degree Relative <60  (ICD-V16.0)  Current Medications (verified): 1)  Adult Aspirin Low Strength 81 Mg  Tbdp (Aspirin) .... Once Daily 2)  Viagra 100 Mg  Tabs (Sildenafil Citrate) .... As Needed 3)  Verapamil Hcl Cr 180 Mg  Cp24 (Verapamil Hcl) .... Once Daily 4)  Benazepril Hcl 10 Mg  Tabs (Benazepril Hcl) .... Once Daily  Allergies (verified): No Known Drug Allergies  Past History:  Past Medical History: Last updated: 04/16/2007 Hiatal Hernia Hypertension polycyemia vera  Past Surgical History: Last updated: 08/29/2006 R Knee Arthoscopic Colonoscopy  Family History: Last updated: 08/29/2006 Family History of Colon CA 1st degree relative <60 Family History of Stroke F 1st degree relative <60  Social History: Last updated: 08/29/2006 Occupation: Single Former Smoker  Risk Factors: Smoking Status: quit (05/25/2009)     Family History: Reviewed history from 08/29/2006 and no changes required. Family History of Colon CA 1st degree relative <60 Family History of Stroke F 1st degree relative <60  Social History: Reviewed history from 08/29/2006 and no changes  required. Occupation: Single Former Smoker  Review of Systems  The patient denies anorexia, fever, weight loss, weight gain, vision loss, decreased hearing, hoarseness, chest pain, syncope, dyspnea on exertion, peripheral edema, prolonged cough, headaches, hemoptysis, abdominal pain, melena, hematochezia, severe indigestion/heartburn, hematuria, incontinence, genital sores, muscle weakness, suspicious skin lesions, transient blindness, difficulty walking, depression, unusual weight change, abnormal bleeding, enlarged lymph nodes, angioedema, and breast masses.    Physical Exam  General:  Well-developed,well-nourished,in no acute distress; alert,appropriate and cooperative throughout examination Eyes:  vision grossly intact, pupils equal, and pupils round.   Ears:  R ear normal, L ear normal, and no external deformities.   Nose:  no external deformity and no nasal discharge.   Mouth:  no posterior lymphoid hypertrophy and no postnasal drip.   Neck:  No deformities, masses, or tenderness noted. Lungs:  normal respiratory effort, normal breath sounds, no dullness, and no fremitus.   Heart:  normal rate, regular rhythm, no gallop, and no rub.   Abdomen:  Bowel sounds positive,abdomen soft and non-tender without masses, organomegaly or hernias noted. Pulses:  R and L carotid,radial,femoral,dorsalis pedis and posterior tibial pulses are full and equal bilaterally Extremities:  No clubbing, cyanosis, edema, or deformity noted with normal full range of motion of all joints.   Neurologic:  No cranial nerve deficits noted. Station and gait are normal. Plantar reflexes are down-going bilaterally. DTRs are symmetrical throughout. Sensory, motor and coordinative functions appear intact. Psych:  Oriented X3 and good eye contact.     Impression &  Recommendations:  Problem # 1:  PHYSICAL EXAMINATION (ICD-V70.0) Assessment Unchanged The pt was asked about all immunizations, health maint. services that  are appropriate to their age and was given guidance on diet exercize  and weight management  Orders: EKG w/ Interpretation (93000)  Colonoscopy: repeat in 2011 (08/20/2004) Td Booster: Historical (07/14/2000)   Flu Vax: Fluvax 3+ (10/20/2008)   Chol: 157 (05/18/2009)   HDL: 52.20 (05/18/2009)   LDL: 91 (05/18/2009)   TG: 68.0 (05/18/2009) TSH: 0.83 (05/18/2009)   PSA: 0.82 (05/18/2009)  Discussed using sunscreen, use of alcohol, drug use, self testicular exam, routine dental care, routine eye care, routine physical exam, seat belts, multiple vitamins, osteoporosis prevention, adequate calcium intake in diet, and recommendations for immunizations.  Discussed exercise and checking cholesterol.  Discussed gun safety, safe sex, and contraception. Also recommend checking PSA.  Complete Medication List: 1)  Adult Aspirin Low Strength 81 Mg Tbdp (Aspirin) .... Once daily 2)  Viagra 100 Mg Tabs (Sildenafil citrate) .... As needed 3)  Verapamil Hcl Cr 180 Mg Cp24 (Verapamil hcl) .... Once daily 4)  Benazepril Hcl 10 Mg Tabs (Benazepril hcl) .... Once daily  Patient Instructions: 1)  Please schedule a follow-up appointment in 6 months. Prescriptions: BENAZEPRIL HCL 10 MG  TABS (BENAZEPRIL HCL) once daily  #90 x 3   Entered and Authorized by:   Stacie Glaze MD   Signed by:   Stacie Glaze MD on 05/25/2009   Method used:   Faxed to ...       [facility] (mail-order)       [address] [place], [place]  [address]       Ph: [phone]       Fax: [phone]   RxID:   [id number] VERAPAMIL HCL CR 180 MG  CP24 (VERAPAMIL HCL) once daily  #90 x 3   Entered and Authorized by:   Stacie Glaze MD   Signed by:   Stacie Glaze MD on 05/25/2009   Method used:   Faxed to ...       [facility] (mail-order)       [address] [place], [place]  [address]       Ph: [phone]       Fax: [phone]   RxID:   [id number] BENAZEPRIL HCL 10 MG  TABS (BENAZEPRIL HCL) once daily  #90 x  3   Entered by:   Willy Eddy, LPN   Authorized by:   Stacie Glaze MD   Signed by:   Willy Eddy, LPN on 01/22/3233   Method used:   Print then Give to Patient   RxID:   [id number] VERAPAMIL HCL CR 180 MG  CP24 (VERAPAMIL HCL) once daily  #90 x 3   Entered by:   Willy Eddy, LPN   Authorized by:   Stacie Glaze MD   Signed by:   Willy Eddy, LPN on 62/83/1517   Method used:   Print then Give to Patient   RxID:   [id number] VIAGRA 100 MG  TABS (SILDENAFIL CITRATE) as needed  #18 x 3   Entered by:   Willy Eddy, LPN   Authorized by:   Stacie Glaze MD   Signed by:   Willy Eddy, LPN on 46/27/0350   Method used:   Print then Give to Patient   RxID:   [id number]

## 2010-02-13 NOTE — Letter (Signed)
Summary: Previsit letter  [facility]  [address] [place], [place] [address]   Phone: [phone]  Fax: [phone]       08/14/2009 MRN: [id number]  [facility] [address] [place], [place]  [address]  Dear Noah King,  Welcome to the Gastroenterology Division at [facility].    You are scheduled to see a nurse for your pre-procedure visit on 09-28-2009 at 11:00am on the 3rd floor at [facility], [address]  We ask that you try to arrive at our office 15 minutes prior to your appointment time to allow for check-in.  Your nurse visit will consist of discussing your medical and surgical history, your immediate family medical history, and your medications.    Please bring a complete list of all your medications or, if you prefer, bring the medication bottles and we will list them.  We will need to be aware of both prescribed and over the counter drugs.  We will need to know exact dosage information as well.  If you are on blood thinners (Coumadin, Plavix, Aggrenox, Ticlid, etc.) please call our office today/prior to your appointment, as we need to consult with your physician about holding your medication.   Please be prepared to read and sign documents such as consent forms, a financial agreement, and acknowledgement forms.  If necessary, and with your consent, a friend or relative is welcome to sit-in on the nurse visit with you.  Please bring your insurance card so that we may make a copy of it.  If your insurance requires a referral to see a specialist, please bring your referral form from your primary care physician.  No co-pay is required for this nurse visit.     If you cannot keep your appointment, please call [phone] to cancel or reschedule prior to your appointment date.  This allows [place] the opportunity to schedule an appointment for another patient in need of care.    Thank you for choosing [facility] for your  medical needs.  We appreciate the opportunity to care for you.  Please visit [place] at our website  to learn more about our practice.                     Sincerely.                                                                                                                   The Gastroenterology Division

## 2010-02-13 NOTE — Letter (Signed)
Summary: [facility] Instructions  [facility]  [address] [place], [place] [address]   Phone: [phone]  Fax: [phone]       Noah King    [birth year]    MRN: [id number]        Procedure Day Dorna Bloom:  Lenor Coffin  10/12/09     Arrival Time:  9:30AM      Procedure Time:  10:30AM     Location of Procedure:                    _X _  [facility] (4th Floor)                        PREPARATION FOR COLONOSCOPY WITH MOVIPREP   Starting 5 days prior to your procedure 10/07/09 do not eat nuts, seeds, popcorn, corn, beans, peas,  salads, or any raw vegetables.  Do not take any fiber supplements (e.g. Metamucil, Citrucel, and Benefiber).  THE DAY BEFORE YOUR PROCEDURE         DATE: 10/11/09   DAY: WEDNESDAY  1.  Drink clear liquids the entire day-NO SOLID FOOD  2.  Do not drink anything colored red or purple.  Avoid juices with pulp.  No orange juice.  3.  Drink at least 64 oz. (8 glasses) of fluid/clear liquids during the day to prevent dehydration and help the prep work efficiently.  CLEAR LIQUIDS INCLUDE: Water Jello Ice Popsicles Tea (sugar ok, no milk/cream) Powdered fruit flavored drinks Coffee (sugar ok, no milk/cream) Gatorade Juice: apple, white grape, white cranberry  Lemonade Clear bullion, consomm, broth Carbonated beverages (any kind) Strained chicken noodle soup Hard Candy                             4.  In the morning, mix first dose of MoviPrep solution:    Empty 1 Pouch A and 1 Pouch B into the disposable container    Add lukewarm drinking water to the top line of the container. Mix to dissolve    Refrigerate (mixed solution should be used within 24 hrs)  5.  Begin drinking the prep at 5:00 p.m. The MoviPrep container is divided by 4 marks.   Every 15 minutes drink the solution down to the next mark (approximately 8 oz) until the full liter is complete.   6.  Follow completed prep with 16 oz of clear liquid of your  choice (Nothing red or purple).  Continue to drink clear liquids until bedtime.  7.  Before going to bed, mix second dose of MoviPrep solution:    Empty 1 Pouch A and 1 Pouch B into the disposable container    Add lukewarm drinking water to the top line of the container. Mix to dissolve    Refrigerate  THE DAY OF YOUR PROCEDURE      DATE: 10/12/09  DAY: THURSDAY  Beginning at 5:30AM (5 hours before procedure):         1. Every 15 minutes, drink the solution down to the next mark (approx 8 oz) until the full liter is complete.  2. Follow completed prep with 16 oz. of clear liquid of your choice.    3. You may drink clear liquids until 8:30AM (2 HOURS BEFORE PROCEDURE).   MEDICATION INSTRUCTIONS  Unless otherwise instructed, you should take regular prescription medications with a small sip of water   as early as possible  the morning of your procedure.        OTHER INSTRUCTIONS  You will need a responsible adult at least 66 years of age to accompany you and drive you home.   This person must remain in the waiting room during your procedure.  Wear loose fitting clothing that is easily removed.  Leave jewelry and other valuables at home.  However, you may wish to bring a book to read or  an iPod/MP3 player to listen to music as you wait for your procedure to start.  Remove all body piercing jewelry and leave at home.  Total time from sign-in until discharge is approximately 2-3 hours.  You should go home directly after your procedure and rest.  You can resume normal activities the  day after your procedure.  The day of your procedure you should not:   Drive   Make legal decisions   Operate machinery   Drink alcohol   Return to work  You will receive specific instructions about eating, activities and medications before you leave.    The above instructions have been reviewed and explained to me by   Clide Cliff, RN______________________    I fully  understand and can verbalize these instructions _____________________________ Date _________

## 2010-02-13 NOTE — Letter (Signed)
Summary: Patient Notice-Hyperplastic Polyps  [facility]  [address] [place], [place] [address]   Phone: [phone]  Fax: [phone]        October 17, 2009 MRN: [id number]    Noah King [address] [place], [place]  [address]    Dear [name],  I am pleased to inform you that the colon polyp(s) removed during your recent colonoscopy was (were) found to be hyperplastic. These types of polyps are NOT pre-cancerous.  It is my recommendation that you have a repeat colonoscopy examination in 5 years.  Should you develop new or worsening symptoms of abdominal pain, bowel habit changes or bleeding from the rectum or bowels, please schedule an evaluation with either your primary care physician or with me.  Continue treatment plan as outlined the day of your exam.  Please call us if you are having persistent problems or have questions about your condition that have not been fully answered at this time.  Sincerely,  Meryl Dare MD [facility]  This letter has been electronically signed by your physician.  Appended Document: Patient Notice-Hyperplastic Polyps letter mailed

## 2010-02-13 NOTE — Letter (Signed)
Summary: Colonoscopy Letter  [facility]  [address] [place], [place] [address]   Phone: [phone]  Fax: [phone]      July 07, 2009 MRN: [id number]   BARRET ESQUIVEL [address] [place], [place]  [address]   Dear Mr. HOCEVAR,   According to your medical record, it is time for you to schedule a Colonoscopy. The American Cancer Society recommends this procedure as a method to detect early colon cancer. Patients with a family history of colon cancer, or a personal history of colon polyps or inflammatory bowel disease are at increased risk.  This letter has beeen generated based on the recommendations made at the time of your procedure. If you feel that in your particular situation this may no longer apply, please contact our office.  Please call our office at [phone] to schedule this appointment or to update your records at your earliest convenience.  Thank you for cooperating with [place] to provide you with the very best care possible.   Sincerely,  Judie Petit T. Russella Dar, M.D.  [facility]astroenterology Division [phone]

## 2010-05-22 ENCOUNTER — Encounter: Payer: Self-pay | Admitting: Internal Medicine

## 2010-05-25 ENCOUNTER — Ambulatory Visit (INDEPENDENT_AMBULATORY_CARE_PROVIDER_SITE_OTHER): Payer: Medicare Other | Admitting: Internal Medicine

## 2010-05-25 ENCOUNTER — Encounter: Payer: Self-pay | Admitting: Internal Medicine

## 2010-05-25 VITALS — BP 124/80 | HR 72 | Temp 98.2°F | Resp 16 | Ht 71.0 in | Wt 180.0 lb

## 2010-05-25 DIAGNOSIS — E785 Hyperlipidemia, unspecified: Secondary | ICD-10-CM

## 2010-05-25 DIAGNOSIS — Z0000 Encounter for general adult medical examination without abnormal findings: Secondary | ICD-10-CM

## 2010-05-25 DIAGNOSIS — N138 Other obstructive and reflux uropathy: Secondary | ICD-10-CM

## 2010-05-25 DIAGNOSIS — D45 Polycythemia vera: Secondary | ICD-10-CM

## 2010-05-25 DIAGNOSIS — I10 Essential (primary) hypertension: Secondary | ICD-10-CM

## 2010-05-25 DIAGNOSIS — E039 Hypothyroidism, unspecified: Secondary | ICD-10-CM

## 2010-05-25 DIAGNOSIS — N401 Enlarged prostate with lower urinary tract symptoms: Secondary | ICD-10-CM

## 2010-05-25 LAB — CBC WITH DIFFERENTIAL/PLATELET
Basophils Absolute: 0 10*3/uL (ref 0.0–0.1)
Eosinophils Absolute: 0.8 10*3/uL — ABNORMAL HIGH (ref 0.0–0.7)
Eosinophils Relative: 12.8 % — ABNORMAL HIGH (ref 0.0–5.0)
HCT: 40.1 % (ref 39.0–52.0)
Lymphs Abs: 1.1 10*3/uL (ref 0.7–4.0)
MCV: 90 fl (ref 78.0–100.0)
Monocytes Absolute: 0.6 10*3/uL (ref 0.1–1.0)
Neutrophils Relative %: 57.7 % (ref 43.0–77.0)
Platelets: 174 10*3/uL (ref 150.0–400.0)
RDW: 15.4 % — ABNORMAL HIGH (ref 11.5–14.6)
WBC: 5.9 10*3/uL (ref 4.5–10.5)

## 2010-05-25 LAB — LIPID PANEL
HDL: 46.7 mg/dL (ref >39.00)
Triglycerides: 82 mg/dL (ref 0.0–149.0)
VLDL: 16.4 mg/dL (ref 0.0–40.0)

## 2010-05-25 LAB — BASIC METABOLIC PANEL
Calcium: 9.3 mg/dL (ref 8.4–10.5)
Creatinine, Ser: 0.9 mg/dL (ref 0.4–1.5)
GFR: 88.71 mL/min (ref >60.00)
Glucose, Bld: 82 mg/dL (ref 70–99)
Sodium: 141 mEq/L (ref 135–145)

## 2010-05-25 LAB — HEPATIC FUNCTION PANEL
Albumin: 4 g/dL (ref 3.5–5.2)
Alkaline Phosphatase: 44 U/L (ref 39–117)
Bilirubin, Direct: 0.1 mg/dL (ref 0.0–0.3)
Total Protein: 6.8 g/dL (ref 6.0–8.3)

## 2010-05-25 LAB — TSH: TSH: 0.87 u[IU]/mL (ref 0.35–5.50)

## 2010-05-25 LAB — POCT URINALYSIS DIPSTICK
Blood, UA: NEGATIVE
Nitrite, UA: NEGATIVE
Protein, UA: NEGATIVE
Spec Grav, UA: 1.025
Urobilinogen, UA: 1
pH, UA: 6

## 2010-05-25 LAB — PSA: PSA: 1.01 ng/mL (ref 0.10–4.00)

## 2010-05-25 NOTE — Progress Notes (Signed)
Subjective:    Noah King is a 65 y.o. male who presents for Medicare Annual/Subsequent preventive examination.   Preventive Screening-Counseling & Management  Tobacco History  Smoking status  . Former Smoker  . Quit date: 01/14/1985  Smokeless tobacco  . Not on file    Problems Prior to Visit Patient has a history of polycythemia CBC will be monitored today he has no symptoms of this disease at this point including any recent DVTs or blood clots no abnormal shortness of breath.  He is a history of hypothyroidism and a TSH will be drawn today he denies any symptoms of hypothyroidism at this point.  Has a history of hyperlipidemia and has been treated with a statin.  He is tolerating the medication well without any side effects.  He has hypertension his blood pressures been well-controlled he denies any chest pain shortness breath PND orthopnea Current Problems (verified) Patient Active Problem List  Diagnoses  . HERPES ZOSTER  . POLYCYTHEMIA RUBRA VERA  . HYPOTHYROIDISM  . HYPERLIPIDEMIA  . HYPERTENSION  . Contracture of Palmar Fascia    Medications Prior to Visit Current Outpatient Prescriptions on File Prior to Visit  Medication Sig Dispense Refill  . aspirin 81 MG tablet Take 81 mg by mouth daily.        . benazepril (LOTENSIN) 10 MG tablet Take 10 mg by mouth daily.        . sildenafil (VIAGRA) 100 MG tablet Take 100 mg by mouth daily as needed.        . verapamil (VERELAN PM) 180 MG 24 hr capsule Take 180 mg by mouth daily.          Current Medications (verified) Current Outpatient Prescriptions  Medication Sig Dispense Refill  . aspirin 81 MG tablet Take 81 mg by mouth daily.        . benazepril (LOTENSIN) 10 MG tablet Take 10 mg by mouth daily.        . sildenafil (VIAGRA) 100 MG tablet Take 100 mg by mouth daily as needed.        . verapamil (VERELAN PM) 180 MG 24 hr capsule Take 180 mg by mouth daily.           Allergies (verified) Review of  patient's allergies indicates not on file.   PAST HISTORY  Family History Family History  Problem Relation Age of Onset  . Colon cancer    . Stroke      Social History History  Substance Use Topics  . Smoking status: Former Smoker    Quit date: 01/14/1985  . Smokeless tobacco: Not on file  . Alcohol Use: No    Are there smokers in your home (other than you)?  No  Risk Factors Current exercise habits: Home exercise routine includes stretching and walking 1 hrs per days.  Dietary issues discussed: hydration  Cardiac risk factors: advanced age (older than 75 for men, 67 for women), dyslipidemia, hypertension and male gender.  Depression Screen (Note: if answer to either of the following is "Yes", a more complete depression screening is indicated)   Q1: Over the past two weeks, have you felt down, depressed or hopeless? No  Q2: Over the past two weeks, have you felt little interest or pleasure in doing things? No  Have you lost interest or pleasure in daily life? No  Do you often feel hopeless? No  Do you cry easily over simple problems? No  Activities of Daily Living In your present state  of health, do you have any difficulty performing the following activities?:  Driving? No Managing money?  no Feeding yourself? No Getting from bed to chair? No Climbing a flight of stairs? No Preparing food and eating?: No Bathing or showering? No Getting dressed: No Getting to the toilet? No Using the toilet:No Moving around from place to place: No In the past year have you fallen or had a near fall?:No   Are you sexually active?  Yes  Do you have more than one partner?  No  Hearing Difficulties: No Do you often ask people to speak up or repeat themselves? No Do you experience ringing or noises in your ears? No Do you have difficulty understanding soft or whispered voices? No   Do you feel that you have a problem with memory? No  Do you often misplace items? No  Do you feel  safe at home?  No  Cognitive Testing  Alert? Yes  Normal Appearance?Yes  Oriented to person? Yes  Place? Yes   Time? Yes  Recall of three objects?  Yes  Can perform simple calculations? Yes  Displays appropriate judgment?Yes  Can read the correct time from a watch face?Yes   Advanced Directives have been discussed with the patient? Yes   List the Names of Other Physician/Practitioners you currently use: 1.  none  Indicate any recent Medical Services you may have received from other than [facility] providers in the past year (date may be approximate).  Immunization History  Administered Date(s) Administered  . Influenza Whole 10/20/2008, 11/23/2009  . Td 07/14/2000    Screening Tests Health Maintenance  Topic Date Due  . Zostavax  10/20/2004  . Pneumococcal Polysaccharide Vaccine Age 21 And Over  10/20/2009  . Tetanus/tdap  07/15/2010  . Influenza Vaccine  10/15/2010  . Colonoscopy  10/13/2019    All answers were reviewed with the patient and necessary referrals were made:  Carrie Mew   05/25/2010   History reviewed: allergies, current medications, past family history, past medical history, past social history, past surgical history and problem list  Review of Systems A comprehensive review of systems was negative.  Past Medical History  Diagnosis Date  . Hiatal hernia   . Polycythemia   . Hypertension    Past Surgical History  Procedure Date  . Rt knee arthroscopic   . Colonoscopy   . Cataract extraction left eye     reports that he quit smoking about 25 years ago. He does not have any smokeless tobacco history on file. He reports that he does not drink alcohol or use illicit drugs. family history includes Colon cancer in an unspecified family member and Stroke in his mother. No Known Allergies    Objective:     Vision by Snellen chart: right eye:20/20, left eye:20/20 Blood pressure 124/80, pulse 72, temperature 98.2 F (36.8 C), temperature source Oral,  resp. rate 16, height 5\' 11"  (1.803 m), weight 180 lb (81.647 kg). Body mass index is 25.10 kg/(m^2).  BP 124/80  Pulse 72  Temp(Src) 98.2 F (36.8 C) (Oral)  Resp 16  Ht 5\' 11"  (1.803 m)  Wt 180 lb (81.647 kg)  BMI 25.10 kg/m2  General Appearance:    Alert, cooperative, no distress, appears stated age  Head:    Normocephalic, without obvious abnormality, atraumatic  Eyes:    PERRL, conjunctiva/corneas clear, EOM's intact, fundi    benign, both eyes       Ears:    Normal TM's and external ear canals,  both ears  Nose:   Nares normal, septum midline, mucosa normal, no drainage    or sinus tenderness  Throat:   Lips, mucosa, and tongue normal; teeth and gums normal  Neck:   Supple, symmetrical, trachea midline, no adenopathy;       thyroid:  No enlargement/tenderness/nodules; no carotid   bruit or JVD  Back:     Symmetric, no curvature, ROM normal, no CVA tenderness  Lungs:     Clear to auscultation bilaterally, respirations unlabored  Chest wall:    No tenderness or deformity  Heart:    Regular rate and rhythm, S1 and S2 normal, no murmur, rub   or gallop  Abdomen:     Soft, non-tender, bowel sounds active all four quadrants,    no masses, no organomegaly  Genitalia:    Normal male without lesion, discharge or tenderness  Rectal:    Normal tone, normal prostate, no masses or tenderness;   guaiac negative stool  Extremities:   Extremities normal, atraumatic, no cyanosis or edema  Pulses:   2+ and symmetric all extremities  Skin:   Skin color, texture, turgor normal, no rashes or lesions  Lymph nodes:   Cervical, supraclavicular, and axillary nodes normal  Neurologic:   CNII-XII intact. Normal strength, sensation and reflexes      throughout       Assessment:      Patient presents for yearly preventative medicine examination.   all immunizations and health maintenance protocols were reviewed with the patient and they are up to date with these protocols.   screening  laboratory values were reviewed with the patient including screening of hyperlipidemia PSA renal function and hepatic function.   There medications past medical history social history problem list and allergies were reviewed in detail.   Goals were established with regard to weight loss exercise diet in compliance with medications      Plan:     reviewed his hypertensive medications his cholesterol medications discussed shingles last year and rhythm I should receive the shingles vaccination yet During the course of the visit the patient was educated and counseled about appropriate screening and preventive services including:    Prostate cancer screening  Diet review for nutrition referral? Yes ____  Not Indicated ___x_   Patient Instructions (the written plan) was given to the patient.  Medicare Attestation I have personally reviewed: The patient's medical and social history Their use of alcohol, tobacco or illicit drugs Their current medications and supplements The patient's functional ability including ADLs,fall risks, home safety risks, cognitive, and hearing and visual impairment Diet and physical activities Evidence for depression or mood disorders  The patient's weight, height, BMI, and visual acuity have been recorded in the chart.  I have made referrals, counseling, and provided education to the patient based on review of the above and I have provided the patient with a written personalized care plan for preventive services.   The patient's blood pressure is well-controlled we'll check a B-.  We'll monitor her lipid panel for his hyperlipidemia and call him if any change in medication is necessary.  We refilled all his medications.  We'll discuss with him the Zostavax since he had shingles last year is not indicated at this time.  Continues to take his blood pressure medication and cholesterol medication on a regular basis he denies any acute problems  Carrie Mew   05/25/2010

## 2010-05-29 NOTE — Op Note (Signed)
NAME:  Noah King, Noah King            ACCOUNT NO.:  [id number]   MEDICAL RECORD NO.:  [id number]          PATIENT TYPE:  AMB   LOCATION:  [facility]                          FACILITY:  [facility]   PHYSICIAN:  Katy Fitch. Sypher, M.D. DATE OF BIRTH:  03-30-1945   DATE OF PROCEDURE:  07/23/2007  DATE OF DISCHARGE:                               OPERATIVE REPORT   PREOPERATIVE DIAGNOSES:  1. Complex Dupuytren contracture involving left hand with severe      involvement of left index finger causing a 70-degree flexion      contracture of the index proximal interphalangeal joint with both      radial and ulnar, spiral bands, and extensive nodular disease over      the proximal phalangeal segment and first dorsal interosseous      region of the index finger.  2. Nodular palmar fibromatosis of long finger overlying proximal      interphalangeal segment.  3. Extensive Dupuytren contracture and palm involving pre-tendinous      fibers to the long ring and small fingers with clinical contracture      of the metacarpal phalangeal joints of the long ring and small      fingers.   POSTOPERATIVE DIAGNOSES:  1. Complex Dupuytren contracture involving left hand with severe      involvement of left index finger causing a 70-degree flexion      contracture of the index proximal interphalangeal joint with both      radial and ulnar, spiral bands, and extensive nodular disease over      the proximal phalangeal segment and first dorsal interosseous      region of the index finger.  2. Nodular palmar fibromatosis of long finger overlying proximal      interphalangeal segment.  3. Extensive Dupuytren contracture and palm involving pre-tendinous      fibers to the long ring and small fingers with clinical contracture      of the metacarpal phalangeal joints of the long ring and small      fingers.   OPERATION:  1. Complex resection of Dupuytren contracture from left index finger      including dissection of first  dorsal interosseous muscle, radial      ulnar neurovascular bundles with release of flexor sheath, and      proximal interphalangeal flexion contracture, followed by Z-plasty      of longitudinal incision at level of distal interphalangeal and      proximal interphalangeal flexion creases.  2. Resection of Dupuytren contracture from left long finger over      middle and proximal phalangeal segments.  3. Extensive removal of Dupuytren contracture in palm with removal of      pre-tendinous fibers and nodular palmar fibromatosis long ring and      small fingers with relief of flexion contractures of metacarpal      phalangeal joints of long ring and small fingers.   OPERATING SURGEON:  Katy Fitch. Sypher, MD   ASSISTANT:  Marveen Reeks Dasnoit, PA   ANESTHESIA:  General by LMA supplemented by a left axillary block.   SUPERVISING  ANESTHESIOLOGIST:  Bedelia Person, MD   INDICATIONS:  Peed is a 65 year old gentleman referred for  evaluation and management of a complex Dupuytren contracture involving  the left hand.   He has a daughter who is an occupational therapist, who was quite  familiar with the diagnosis and management of Dupuytren palmar  fibromatosis.   He was referred for an upper extremity orthopedic consult and noted to  have severe Dupuytren contracture.  There was a positive family history  of Celtic ancestry and Dupuytren contracture.   His presentation was atypical in that he had very severe involvement of  his index finger with nodules in the region of the first dorsal  interosseous muscle and severe nodule involvement of the radial and  ulnar aspects of the proximal phalangeal segment of the index finger  with a 70-degree flexion contracture of the index finger PIP joint and  severe nodular disease in the palm.   We pointed out to Mr. Slagter, that while we cannot cure Dupuytren  contracture.  We can help the flexion contracture of the fingers and  improved hand  function by resection of the palmar fibromatosis.   During his preoperative, informed consent, we outlined the various  treatment alternatives, so this predicament including observation,  needle aponeurotomy, injection of collagenase, and palmar fasciectomy.  Given his severe involvement and disabling flexion contracture.  He  decided to proceed with palmar fasciectomy.   During our office consult and during our preoperative discussion in the  holding area of the surgical center, we reviewed the possibility could  develop a late onset regional pain syndrome.  After an extensive  dissection of this nature, after informed consent, he was brought to the  operating at this time.   Preoperatively, he was interviewed by Dr. Gypsy Balsam, who placed a left  axillary block for perioperative comfort.  He was advised that he will  need to elevate his hand for several days postoperatively.   As his daughter is an occupational therapist, she should be very helpful  in facilitating his proper aftercare.  He does understand, he will be  splinted for approximately 8-10 weeks following surgery at night, and  will need to participate in the hand therapy program.  We will look  forward to the assistance of his daughter in martialing his hand therapy  program.   PROCEDURE:  BRISTOL SOY was brought to the operating room and  placed supine position on the table.  Following anesthesia consult by  Dr. Gypsy Balsam in the holding area.  A left axillary block was placed without  complication.   He was brought to room 8 of the [facility], placed supine  position on the table and under Dr. Burnett Corrente direct supervision general  anesthesia by LMA technique was induced.  The left arm was prepped with  Betadine soap solution, sterilely draped.  A pneumatic-tourniquet was  brought proximal left brachium, 1 g of Ancef was administered as an IV  prophylactic antibiotic.   Following exsanguination, the left arm  with Esmarch bandage arterial  tourniquet was inflated to 240 mmHg.   The procedure commenced with planning of incisions in the palm.   Given the very complex involvement of both the radial and ulnar aspects  of the index finger, we elected to perform a midline longitudinal  incision with Z-plasty at the flexion creases.   A very arduous dissection was performed along the radial and ulnar  neurovascular bundles removing extensive nodular disease on the first  dorsal interosseous muscle at the level of metacarpal neck and head and  extensive involvement of the lateral fascial sheets of the finger.  Both  radial and ulnar, for the index finger as well as spiral bands distally  and a central cord at the level the PIP joint.  The dissection of the  index finger required 1-1/2 hours of tourniquet time.  Ultimately, we  were able to relieve the flexion contracture of the MP and PIP joints  and were able to preserve the neurovascular structures.  Given the  extensive degree of dissection.  After completion of the resection of  the pathologic fascia, the tourniquet released to assure that we had  adequate capillary refill to the skin flaps and the pulp of the index  finger.   We had refilled to the pulp within 2 seconds and satisfactory capillary  refill in the skin margins despite extensive undermining.   The flexion creases were reestablished at the DIP and PIP joints by 45  degrees angle Z-plasties that were turned and inset with corner sutures  of 5-0 nylon.  The longitudinal aspects of the incision were inset with  mattress sutures of 5-0 nylon.  We did perform a dissection to level of  the proximal metaphysis of the index metacarpal, removing the pre-  tendinous fibers and natatory ligaments at the first web.  We  subsequently performed a Brunner zigzag incision over the long finger,  exposing the middle and proximal phalangeal segments of the long finger  and removed extensive  nodular disease and pre-tendinous fiber disease.  After completion of the first tourniquet, we had a 15-minute wait.  While the wounds were irrigated and repaired without tourniquet with  proper hemostasis.  We then proceeded to perform an open palm and  Brunner zigzag incision dissection of the pre-tendinous fibers of the  long ring and small fingers in the palm, relieving the metacarpal  phalangeal joint flexion contracture of the long ring and small fingers  under separate tourniquet.  The separate tourniquet was just short of 1  hour with complete removal of the pre-tendinous fibers to the small ring  and long fingers as well as nodular disease extending to the proximal  phalangeal segments of the ring and small fingers.  This allowed  complete relief of the of flexion contractures.   Given the extensive dissection of the index finger, I elected not to  remove the nodule that was in the web space between the index and long  fingers.   Given that the dominant artery to the fingers, typically the ulnar  proper digital artery of the index finger.  I was anxious to perform  extensive dissection in the region of the common digital vessel on the  outside chance of vasospasm would occur, we could have some compromise  of the skin flap to the index finger.   After completion of the dissection, the skin flaps were examined with  tourniquet down.  Hemostasis achieved and the wounds were repaired with  corner sutures of 5-0 nylon and mattress sutures of 5-0 nylon.  Excellent capillary fill was noted in all fingers with normal turgor.  A  portion of the transverse incision of the palm was left open for open  palm technique to prevent hematoma formation.  The wounds were dressed  with Silvadene, Adaptic, sterile gauze and a voluminous hand dressing  with a volar plaster splint maintaining the wrist in neutral position  and the fingers in near full extension.  There were no apparent  complications.   Aftercare, Mr.Mohabir was provided appropriate analgesics and  antibiotics in the form of Keflex 500 mg 1 p.o. q.8 h. x4 days as a  prophylactic antibiotic.   During my postoperative conference with his daughter, I encouraged him  to elevate his hand for at least 3 days above the heart and will see him  back for followup in 6 days for dressing change and initiation of a  simple exercise program.   Both Mr. Halley and his daughter anticipate propagations of a  forearm based night extension splint.      Katy Fitch Sypher, M.D.  Electronically Signed     RVS/MEDQ  D:  07/24/2007  T:  07/24/2007  Job:  [id number]

## 2010-06-29 ENCOUNTER — Other Ambulatory Visit: Payer: Self-pay | Admitting: *Deleted

## 2010-06-29 MED ORDER — BENAZEPRIL HCL 10 MG PO TABS: 10.0000 mg | ORAL_TABLET | Freq: Every day | ORAL | Status: DC

## 2010-06-29 MED ORDER — VERAPAMIL HCL ER 180 MG PO CP24: 180.0000 mg | ORAL_CAPSULE | Freq: Every day | ORAL | Status: DC

## 2010-10-11 LAB — HEPATIC FUNCTION PANEL
ALT: 27
Alkaline Phosphatase: 47
Indirect Bilirubin: 1 — ABNORMAL HIGH
Total Bilirubin: 1.3 — ABNORMAL HIGH
Total Protein: 6.9

## 2010-10-11 LAB — BASIC METABOLIC PANEL
BUN: 8
CO2: 27
Chloride: 105
Potassium: 4.2

## 2010-11-27 ENCOUNTER — Encounter: Payer: Self-pay | Admitting: Internal Medicine

## 2010-11-27 ENCOUNTER — Ambulatory Visit (INDEPENDENT_AMBULATORY_CARE_PROVIDER_SITE_OTHER): Payer: Medicare Other | Admitting: Internal Medicine

## 2010-11-27 VITALS — BP 130/80 | HR 72 | Temp 98.2°F | Resp 16 | Ht 71.0 in | Wt 182.0 lb

## 2010-11-27 DIAGNOSIS — D751 Secondary polycythemia: Secondary | ICD-10-CM

## 2010-11-27 DIAGNOSIS — I10 Essential (primary) hypertension: Secondary | ICD-10-CM

## 2010-11-27 DIAGNOSIS — E785 Hyperlipidemia, unspecified: Secondary | ICD-10-CM

## 2010-11-27 DIAGNOSIS — Z23 Encounter for immunization: Secondary | ICD-10-CM

## 2010-11-27 NOTE — Patient Instructions (Signed)
Patient was instructed to continue all medications as prescribed. To stop at the checkout desk and schedule a followup appointment  

## 2010-11-27 NOTE — Progress Notes (Signed)
  Subjective:    Patient ID: Noah King, male    DOB: 05/29/1945, 65 y.o.   MRN: [id number]  HPI Patient presents for six-month followup of hypothyroidism hyperlipidemia and hypertension with a history of polycythemia rubra vera.  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  His weight is stable at 182 pounds he denies any chest pain shortness of breath PND orthopnea.  He also has modified his alcohol intake and   Review of Systems  Constitutional: Negative for fever and fatigue.  HENT: Negative for hearing loss, congestion, neck pain and postnasal drip.   Eyes: Negative for discharge, redness and visual disturbance.  Respiratory: Negative for cough, shortness of breath and wheezing.   Cardiovascular: Negative for leg swelling.  Gastrointestinal: Negative for abdominal pain, constipation and abdominal distention.  Genitourinary: Negative for urgency and frequency.  Musculoskeletal: Negative for joint swelling and arthralgias.  Skin: Negative for color change and rash.  Neurological: Negative for weakness and light-headedness.  Hematological: Negative for adenopathy.  Psychiatric/Behavioral: Negative for behavioral problems.   Past Medical History  Diagnosis Date  . Hiatal hernia   . Polycythemia   . Hypertension    Past Surgical History  Procedure Date  . Rt knee arthroscopic   . Colonoscopy   . Cataract extraction left eye     reports that he quit smoking about 25 years ago. He does not have any smokeless tobacco history on file. He reports that he does not drink alcohol or use illicit drugs. family history includes Colon cancer in an unspecified family member and Stroke in his mother. No Known Allergies     Objective:   Physical Exam  Nursing note and vitals reviewed. Constitutional: He appears well-developed and well-nourished.  HENT:  Head: Normocephalic and atraumatic.  Eyes: Conjunctivae  are normal. Pupils are equal, round, and reactive to light.  Neck: Normal range of motion. Neck supple.  Cardiovascular: Normal rate and regular rhythm.   Pulmonary/Chest: Effort normal and breath sounds normal.  Abdominal: Soft. Bowel sounds are normal.          Assessment & Plan:  Patient is to standpoint of his hypertension his blood pressures well-controlled as compliant with his medications.  Patient states he is also compliant with diet having decreased alcohol and is following a low-cholesterol diet on medications for cholesterol.  His polycythemia appears to be stable for monitoring he'll complete physical in 6 months time

## 2011-03-14 ENCOUNTER — Other Ambulatory Visit: Payer: Self-pay | Admitting: Internal Medicine

## 2011-05-28 ENCOUNTER — Encounter: Payer: Medicare Other | Admitting: Internal Medicine

## 2011-08-26 ENCOUNTER — Ambulatory Visit (INDEPENDENT_AMBULATORY_CARE_PROVIDER_SITE_OTHER): Payer: Medicare Other | Admitting: Internal Medicine

## 2011-08-26 ENCOUNTER — Telehealth: Payer: Self-pay | Admitting: *Deleted

## 2011-08-26 ENCOUNTER — Encounter: Payer: Self-pay | Admitting: Internal Medicine

## 2011-08-26 VITALS — BP 130/80 | HR 72 | Temp 98.2°F | Resp 16 | Ht 71.0 in | Wt 180.0 lb

## 2011-08-26 DIAGNOSIS — I739 Peripheral vascular disease, unspecified: Secondary | ICD-10-CM

## 2011-08-26 DIAGNOSIS — R972 Elevated prostate specific antigen [PSA]: Secondary | ICD-10-CM

## 2011-08-26 DIAGNOSIS — E785 Hyperlipidemia, unspecified: Secondary | ICD-10-CM

## 2011-08-26 DIAGNOSIS — Z23 Encounter for immunization: Secondary | ICD-10-CM

## 2011-08-26 DIAGNOSIS — D45 Polycythemia vera: Secondary | ICD-10-CM

## 2011-08-26 DIAGNOSIS — Z0000 Encounter for general adult medical examination without abnormal findings: Secondary | ICD-10-CM

## 2011-08-26 DIAGNOSIS — T887XXA Unspecified adverse effect of drug or medicament, initial encounter: Secondary | ICD-10-CM

## 2011-08-26 LAB — BASIC METABOLIC PANEL
CO2: 28 mEq/L (ref 19–32)
Calcium: 9.7 mg/dL (ref 8.4–10.5)
Chloride: 102 mEq/L (ref 96–112)
Potassium: 4.8 mEq/L (ref 3.5–5.1)
Sodium: 140 mEq/L (ref 135–145)

## 2011-08-26 LAB — CBC WITH DIFFERENTIAL/PLATELET
Basophils Relative: 0.3 % (ref 0.0–3.0)
Eosinophils Relative: 5.1 % — ABNORMAL HIGH (ref 0.0–5.0)
HCT: 42.6 % (ref 39.0–52.0)
Hemoglobin: 13.9 g/dL (ref 13.0–17.0)
Lymphs Abs: 1 10*3/uL (ref 0.7–4.0)
Monocytes Relative: 11.5 % (ref 3.0–12.0)
Platelets: 179 10*3/uL (ref 150.0–400.0)
RBC: 5.25 Mil/uL (ref 4.22–5.81)
WBC: 5.9 10*3/uL (ref 4.5–10.5)

## 2011-08-26 LAB — HEPATIC FUNCTION PANEL
ALT: 27 U/L (ref 0–53)
Albumin: 4.4 g/dL (ref 3.5–5.2)
Alkaline Phosphatase: 46 U/L (ref 39–117)
Bilirubin, Direct: 0.2 mg/dL (ref 0.0–0.3)
Total Protein: 7.5 g/dL (ref 6.0–8.3)

## 2011-08-26 LAB — LIPID PANEL: Total CHOL/HDL Ratio: 3

## 2011-08-26 LAB — PSA: PSA: 1.43 ng/mL (ref 0.10–4.00)

## 2011-08-26 MED ORDER — SILDENAFIL CITRATE 100 MG PO TABS: 100.0000 mg | ORAL_TABLET | Freq: Every day | ORAL | Status: DC | PRN

## 2011-08-26 MED ORDER — VERAPAMIL HCL ER 180 MG PO CP24: 180.0000 mg | ORAL_CAPSULE | Freq: Every day | ORAL | Status: DC

## 2011-08-26 MED ORDER — BENAZEPRIL HCL 10 MG PO TABS: 10.0000 mg | ORAL_TABLET | Freq: Every day | ORAL | Status: DC

## 2011-08-26 NOTE — Progress Notes (Signed)
Subjective:    Patient ID: Noah King, male    DOB: October 10, 1945, 66 y.o.   MRN: [id number]  HPI Patient is a 16 show male presents for Medicare wellness examination with a chief complaint of a history of polycythemia rubra vera hypothyroidism hyperlipidemia and hypertension.  His blood pressure is well controlled with current medications we will measure a lipid and liver today as well as a TSH we will get a CBC differential he has marked cold lower extremities with discoloration of his feet this may be a part of the polycythemia but may also represent PAD   Review of Systems  Constitutional: Negative for fever and fatigue.  HENT: Negative for hearing loss, congestion, neck pain and postnasal drip.   Eyes: Negative for discharge, redness and visual disturbance.  Respiratory: Negative for cough, shortness of breath and wheezing.   Cardiovascular: Negative for leg swelling.  Gastrointestinal: Negative for abdominal pain, constipation and abdominal distention.  Genitourinary: Negative for urgency and frequency.  Musculoskeletal: Negative for joint swelling and arthralgias.  Skin: Negative for color change and rash.  Neurological: Negative for weakness and light-headedness.  Hematological: Negative for adenopathy.  Psychiatric/Behavioral: Negative for behavioral problems.   Past Medical History  Diagnosis Date  . Hiatal hernia   . Polycythemia   . Hypertension     History   Social History  . Marital Status: Single    Spouse Name: N/A    Number of Children: N/A  . Years of Education: N/A   Occupational History  . Not on file.   Social History Main Topics  . Smoking status: Former Smoker    Quit date: 01/14/1985  . Smokeless tobacco: Not on file  . Alcohol Use: No  . Drug Use: No  . Sexually Active: Yes   Other Topics Concern  . Not on file   Social History Narrative  . No narrative on file    Past Surgical History  Procedure Date  . Rt knee arthroscopic     . Colonoscopy   . Cataract extraction left eye     Family History  Problem Relation Age of Onset  . Colon cancer    . Stroke Mother     No Known Allergies  Current Outpatient Prescriptions on File Prior to Visit  Medication Sig Dispense Refill  . aspirin 81 MG tablet Take 81 mg by mouth daily.        . benazepril (LOTENSIN) 10 MG tablet Take 1 tablet (10 mg total) by mouth daily.  90 tablet  3  . sildenafil (VIAGRA) 100 MG tablet Take 100 mg by mouth daily as needed.        . verapamil (VERELAN PM) 180 MG 24 hr capsule TAKE 1 CAPSULE DAILY  90 capsule  3    BP 130/80  Pulse 72  Temp 98.2 F (36.8 C)  Resp 16  Ht 5\' 11"  (1.803 m)  Wt 180 lb (81.647 kg)  BMI 25.10 kg/m2       Objective:   Physical Exam  Nursing note and vitals reviewed. Constitutional: He is oriented to person, place, and time. He appears well-developed and well-nourished.  HENT:  Head: Normocephalic and atraumatic.  Eyes: Conjunctivae are normal. Pupils are equal, round, and reactive to light.  Neck: Normal range of motion. Neck supple.  Cardiovascular: Normal rate and regular rhythm.   Pulmonary/Chest: Effort normal and breath sounds normal.  Abdominal: Soft. Bowel sounds are normal.  Musculoskeletal: Normal range of motion. He exhibits edema.  Neurological: He is alert and oriented to person, place, and time.  Skin:       Cool discolored lower extremities          Assessment & Plan:  Patient has cool discolored lower extremities that may represent peripheral artery disease a screening arterial Dopplers lower extremities has been ordered.  His blood pressure stable his current medications his history of polycythemia will be measured with a CBC differential and a TSH and a lipid profile will also be drawn today Subjective:    Noah King is a 66 y.o. male who presents for Medicare Annual/Subsequent preventive examination.   Preventive Screening-Counseling &  Management  Tobacco History  Smoking status  . Former Smoker  . Quit date: 01/14/1985  Smokeless tobacco  . Not on file    Problems Prior to Visit 1.   Current Problems (verified) Patient Active Problem List  Diagnosis  . HERPES ZOSTER  . POLYCYTHEMIA RUBRA VERA  . HYPOTHYROIDISM  . HYPERLIPIDEMIA  . HYPERTENSION  . Contracture of Palmar Fascia    Medications Prior to Visit Current Outpatient Prescriptions on File Prior to Visit  Medication Sig Dispense Refill  . aspirin 81 MG tablet Take 81 mg by mouth daily.        . benazepril (LOTENSIN) 10 MG tablet Take 1 tablet (10 mg total) by mouth daily.  90 tablet  3  . sildenafil (VIAGRA) 100 MG tablet Take 100 mg by mouth daily as needed.        . verapamil (VERELAN PM) 180 MG 24 hr capsule TAKE 1 CAPSULE DAILY  90 capsule  3    Current Medications (verified) Current Outpatient Prescriptions  Medication Sig Dispense Refill  . aspirin 81 MG tablet Take 81 mg by mouth daily.        . benazepril (LOTENSIN) 10 MG tablet Take 1 tablet (10 mg total) by mouth daily.  90 tablet  3  . sildenafil (VIAGRA) 100 MG tablet Take 100 mg by mouth daily as needed.        . verapamil (VERELAN PM) 180 MG 24 hr capsule TAKE 1 CAPSULE DAILY  90 capsule  3     Allergies (verified) Review of patient's allergies indicates no known allergies.   PAST HISTORY  Family History Family History  Problem Relation Age of Onset  . Colon cancer    . Stroke Mother     Social History History  Substance Use Topics  . Smoking status: Former Smoker    Quit date: 01/14/1985  . Smokeless tobacco: Not on file  . Alcohol Use: No    Are there smokers in your home (other than you)?  No  Risk Factors Current exercise habits: Gym/ health club routine includes cardio and light weights.  Dietary issues discussed: none   Cardiac risk factors: dyslipidemia, family history of premature cardiovascular disease, hypertension and male gender.  Depression  Screen (Note: if answer to either of the following is "Yes", a more complete depression screening is indicated)   Q1: Over the past two weeks, have you felt down, depressed or hopeless? No  Q2: Over the past two weeks, have you felt little interest or pleasure in doing things? No  Have you lost interest or pleasure in daily life? No  Do you often feel hopeless? No  Do you cry easily over simple problems? No  Activities of Daily Living In your present state of health, do you have any difficulty performing the following activities?:  Driving?  No Managing money?  No Feeding yourself? No Getting from bed to chair? No Climbing a flight of stairs? No Preparing food and eating?: No Bathing or showering? No Getting dressed: No Getting to the toilet? No Using the toilet:No Moving around from place to place: No In the past year have you fallen or had a near fall?:No   Are you sexually active?  Yes  Do you have more than one partner?  No  Hearing Difficulties: No Do you often ask people to speak up or repeat themselves? No Do you experience ringing or noises in your ears? No Do you have difficulty understanding soft or whispered voices? No   Do you feel that you have a problem with memory? No  Do you often misplace items? No  Do you feel safe at home?  No  Cognitive Testing  Alert? Yes  Normal Appearance?Yes  Oriented to person? Yes  Place? Yes   Time? Yes  Recall of three objects?  Yes  Can perform simple calculations? Yes  Displays appropriate judgment?Yes  Can read the correct time from a watch face?Yes   Advanced Directives have been discussed with the patient? Yes   List the Names of Other Physician/Practitioners you currently use: 1.    Indicate any recent Medical Services you may have received from other than [facility] providers in the past year (date may be approximate).  Immunization History  Administered Date(s) Administered  . Influenza Split 11/27/2010  . Influenza  Whole 10/20/2008, 11/23/2009  . Td 07/14/2000  . Tdap 08/26/2011    Screening Tests Health Maintenance  Topic Date Due  . Zostavax  10/20/2004  . Pneumococcal Polysaccharide Vaccine Age 60 And Over  10/20/2009  . Tetanus/tdap  07/15/2010  . Influenza Vaccine  10/15/2011  . Colonoscopy  10/13/2019    All answers were reviewed with the patient and necessary referrals were made:  Carrie Mew, MD   08/26/2011   History reviewed: allergies, current medications, past family history, past medical history, past social history, past surgical history and problem list  Review of Systems A comprehensive review of systems was negative.    Objective:     Vision by Snellen chart: right eye:20/20, left eye:20/20 Blood pressure 130/80, pulse 72, temperature 98.2 F (36.8 C), resp. rate 16, height 5\' 11"  (1.803 m), weight 180 lb (81.647 kg). Body mass index is 25.10 kg/(m^2).  No exam performed today, exam done in problem focused exam.     Assessment:      Patient presents for yearly preventative medicine examination.   all immunizations and health maintenance protocols were reviewed with the patient and they are up to date with these protocols.   screening laboratory values were reviewed with the patient including screening of hyperlipidemia PSA renal function and hepatic function.   There medications past medical history social history problem list and allergies were reviewed in detail.   Goals were established with regard to weight loss exercise diet in compliance with medications      Plan:     During the course of the visit the patient was educated and counseled about appropriate screening and preventive services including:    Influenza vaccine  Td vaccine  Prostate cancer screening  Smoking cessation counseling  Diet review for nutrition referral? Yes ____  Not Indicated x____   Patient Instructions (the written plan) was given to the patient.  Medicare  Attestation I have personally reviewed: The patient's medical and social history Their use of alcohol, tobacco or  illicit drugs Their current medications and supplements The patient's functional ability including ADLs,fall risks, home safety risks, cognitive, and hearing and visual impairment Diet and physical activities Evidence for depression or mood disorders  The patient's weight, height, BMI, and visual acuity have been recorded in the chart.  I have made referrals, counseling, and provided education to the patient based on review of the above and I have provided the patient with a written personalized care plan for preventive services.     Carrie Mew, MD   08/26/2011

## 2011-08-26 NOTE — Patient Instructions (Addendum)
The patient is instructed to continue all medications as prescribed. Schedule followup with check out clerk upon leaving the clinic  

## 2011-08-26 NOTE — Telephone Encounter (Signed)
Opened in error

## 2011-09-02 ENCOUNTER — Encounter (INDEPENDENT_AMBULATORY_CARE_PROVIDER_SITE_OTHER): Payer: Medicare Other

## 2011-09-02 DIAGNOSIS — R0989 Other specified symptoms and signs involving the circulatory and respiratory systems: Secondary | ICD-10-CM

## 2011-09-02 DIAGNOSIS — I739 Peripheral vascular disease, unspecified: Secondary | ICD-10-CM

## 2012-02-26 ENCOUNTER — Encounter: Payer: Self-pay | Admitting: Internal Medicine

## 2012-02-26 ENCOUNTER — Ambulatory Visit (INDEPENDENT_AMBULATORY_CARE_PROVIDER_SITE_OTHER): Payer: Medicare Other | Admitting: Internal Medicine

## 2012-02-26 VITALS — BP 130/77 | HR 72 | Temp 98.3°F | Resp 16 | Ht 71.0 in | Wt 182.0 lb

## 2012-02-26 DIAGNOSIS — R972 Elevated prostate specific antigen [PSA]: Secondary | ICD-10-CM

## 2012-02-26 DIAGNOSIS — I10 Essential (primary) hypertension: Secondary | ICD-10-CM

## 2012-02-26 LAB — BASIC METABOLIC PANEL
CO2: 27 mEq/L (ref 19–32)
Calcium: 9.7 mg/dL (ref 8.4–10.5)
Creatinine, Ser: 0.9 mg/dL (ref 0.4–1.5)
GFR: 92.93 mL/min (ref >60.00)
Glucose, Bld: 84 mg/dL (ref 70–99)
Sodium: 137 mEq/L (ref 135–145)

## 2012-02-26 NOTE — Progress Notes (Signed)
Subjective:    Patient ID: Noah King, male    DOB: October 06, 1945, 67 y.o.   MRN: [id number]  HPI  Follow up for HTN and elevated PSA monitor   Review of Systems  Constitutional: Negative for fever and fatigue.  HENT: Negative for hearing loss, congestion, neck pain and postnasal drip.   Eyes: Negative for discharge, redness and visual disturbance.  Respiratory: Negative for cough, shortness of breath and wheezing.   Cardiovascular: Negative for leg swelling.  Gastrointestinal: Negative for abdominal pain, constipation and abdominal distention.  Genitourinary: Negative for urgency and frequency.  Musculoskeletal: Negative for joint swelling and arthralgias.  Skin: Negative for color change and rash.  Neurological: Negative for weakness and light-headedness.  Hematological: Negative for adenopathy.  Psychiatric/Behavioral: Negative for behavioral problems.   Past Medical History  Diagnosis Date  . Hiatal hernia   . Polycythemia   . Hypertension     History   Social History  . Marital Status: Single    Spouse Name: N/A    Number of Children: N/A  . Years of Education: N/A   Occupational History  . Not on file.   Social History Main Topics  . Smoking status: Former Smoker    Quit date: 01/14/1985  . Smokeless tobacco: Not on file  . Alcohol Use: No  . Drug Use: No  . Sexually Active: Yes   Other Topics Concern  . Not on file   Social History Narrative  . No narrative on file    Past Surgical History  Procedure Laterality Date  . Rt knee arthroscopic    . Colonoscopy    . Cataract extraction left eye      Family History  Problem Relation Age of Onset  . Colon cancer    . Stroke Mother     No Known Allergies  Current Outpatient Prescriptions on File Prior to Visit  Medication Sig Dispense Refill  . aspirin 81 MG tablet Take 81 mg by mouth daily.        . benazepril (LOTENSIN) 10 MG tablet Take 1 tablet (10 mg total) by mouth daily.  90 tablet   3  . sildenafil (VIAGRA) 100 MG tablet Take 1 tablet (100 mg total) by mouth daily as needed.  10 tablet  3  . verapamil (VERELAN PM) 180 MG 24 hr capsule Take 1 capsule (180 mg total) by mouth daily.  90 capsule  3   No current facility-administered medications on file prior to visit.    BP 130/77  Pulse 72  Temp(Src) 98.3 F (36.8 C)  Resp 16  Ht 5\' 11"  (1.803 m)  Wt 182 lb (82.555 kg)  BMI 25.4 kg/m2       Objective:   Physical Exam  Constitutional: He appears well-developed and well-nourished.  HENT:  Head: Normocephalic and atraumatic.  Eyes: Conjunctivae are normal. Pupils are equal, round, and reactive to light.  Neck: Normal range of motion. Neck supple.  Cardiovascular: Normal rate and regular rhythm.   Pulmonary/Chest: Effort normal and breath sounds normal.  Abdominal: Soft. Bowel sounds are normal.          Assessment & Plan:  Patient presents for six-month monitoring of blood pressure second blood pressure reading was 130/77 his pulse is 72 he is tolerating the medications well he is on 2 different drugs for blood pressure medication including a calcium channel blocker and 8 ACE inhibitor he has no side effects from his medications.  We will monitor renal function today  with a creatinine BUN and potassium level on ACE drug.   We will check psa for velocity change as the last reading was up over 30%

## 2012-07-20 ENCOUNTER — Other Ambulatory Visit: Payer: Self-pay | Admitting: Internal Medicine

## 2012-08-19 ENCOUNTER — Other Ambulatory Visit: Payer: Self-pay

## 2012-08-26 ENCOUNTER — Encounter: Payer: Medicare Other | Admitting: Internal Medicine

## 2012-09-01 NOTE — Progress Notes (Signed)
Subjective:    Patient ID: Noah King, male    DOB: 07-17-1945, 67 y.o.   MRN: [id number]  HPI Patient is a 67 year old male who presents for his annual Medicare exam and also followup for chronic problems including hyperlipidemia hypertension and hypothyroidism.  He is generally doing well with an acute complaint of chronic clearing of the throat and a chronic hacking cough that may be related to either allergies which have flared recently with the use of the medication known as an ACE inhibitor   Review of Systems  Constitutional: Negative for fever and fatigue.  HENT: Negative for hearing loss, congestion, neck pain and postnasal drip.   Eyes: Negative for discharge, redness and visual disturbance.  Respiratory: Positive for cough. Negative for shortness of breath and wheezing.        Possible ace reaction  Cardiovascular: Negative for leg swelling.  Gastrointestinal: Negative for abdominal pain, constipation and abdominal distention.  Genitourinary: Negative for urgency and frequency.  Musculoskeletal: Negative for joint swelling and arthralgias.  Skin: Negative for color change and rash.  Neurological: Negative for weakness and light-headedness.  Hematological: Negative for adenopathy.  Psychiatric/Behavioral: Negative for behavioral problems.       Past Medical History  Diagnosis Date  . Hiatal hernia   . Polycythemia   . Hypertension     History   Social History  . Marital Status: Single    Spouse Name: N/A    Number of Children: N/A  . Years of Education: N/A   Occupational History  . Not on file.   Social History Main Topics  . Smoking status: Former Smoker    Quit date: 01/14/1985  . Smokeless tobacco: Not on file  . Alcohol Use: No  . Drug Use: No  . Sexual Activity: Yes   Other Topics Concern  . Not on file   Social History Narrative  . No narrative on file    Past Surgical History  Procedure Laterality Date  . Rt knee arthroscopic     . Colonoscopy    . Cataract extraction left eye      Family History  Problem Relation Age of Onset  . Colon cancer    . Stroke Mother     No Known Allergies  Current Outpatient Prescriptions on File Prior to Visit  Medication Sig Dispense Refill  . aspirin 81 MG tablet Take 81 mg by mouth daily.        . benazepril (LOTENSIN) 10 MG tablet TAKE 1 TABLET DAILY  90 tablet  3  . sildenafil (VIAGRA) 100 MG tablet Take 1 tablet (100 mg total) by mouth daily as needed.  10 tablet  3  . verapamil (VERELAN PM) 180 MG 24 hr capsule Take 1 capsule (180 mg total) by mouth daily.  90 capsule  3   No current facility-administered medications on file prior to visit.    BP 122/78  Pulse 72  Temp(Src) 98.6 F (37 C)  Resp 16  Ht 5\' 11"  (1.803 m)  Wt 182 lb (82.555 kg)  BMI 25.4 kg/m2    Objective:   Physical Exam  Nursing note and vitals reviewed. Constitutional: He is oriented to person, place, and time. He appears well-developed and well-nourished.  HENT:  Head: Normocephalic and atraumatic.  Purple turbinates  Eyes: Conjunctivae are normal. Pupils are equal, round, and reactive to light.  Neck: Normal range of motion. Neck supple.  Cardiovascular: Normal rate and regular rhythm.   Pulmonary/Chest: Effort normal and breath  sounds normal.  Abdominal: Soft. Bowel sounds are normal.  Genitourinary: Rectum normal and prostate normal.  Musculoskeletal: Normal range of motion.  Neurological: He is alert and oriented to person, place, and time.  Skin: Skin is warm and dry.  Psychiatric: He has a normal mood and affect.          Assessment & Plan:  Patient will have a lipid panel for monitoring of his hyperlipidemia and a liver panel to make sure that there is no drug side effect.  He will have a the neck to look for renal function and potassium on an ACE inhibitor he will have a TSH for his hypothyroidism he will have a liver panel and a CBC for potential drug side effects and  a urinalysis to make sure the kidney function is appropriate. Nocturia x one each night monitor psa    Subjective:    Noah King is a 67 y.o. male who presents for Medicare Annual/Subsequent preventive examination.   Preventive Screening-Counseling & Management  Tobacco History  Smoking status  . Former Smoker  . Quit date: 01/14/1985  Smokeless tobacco  . Not on file    Problems Prior to Visit 1.   Current Problems (verified) Patient Active Problem List   Diagnosis Date Noted  . HERPES ZOSTER 12/13/2009  . POLYCYTHEMIA RUBRA VERA 04/16/2007  . Contracture of Palmar Fascia 04/16/2007  . HYPOTHYROIDISM 08/29/2006  . HYPERLIPIDEMIA 08/29/2006  . HYPERTENSION 08/29/2006    Medications Prior to Visit Current Outpatient Prescriptions on File Prior to Visit  Medication Sig Dispense Refill  . aspirin 81 MG tablet Take 81 mg by mouth daily.        . benazepril (LOTENSIN) 10 MG tablet TAKE 1 TABLET DAILY  90 tablet  3  . sildenafil (VIAGRA) 100 MG tablet Take 1 tablet (100 mg total) by mouth daily as needed.  10 tablet  3  . verapamil (VERELAN PM) 180 MG 24 hr capsule Take 1 capsule (180 mg total) by mouth daily.  90 capsule  3   No current facility-administered medications on file prior to visit.    Current Medications (verified) Current Outpatient Prescriptions  Medication Sig Dispense Refill  . aspirin 81 MG tablet Take 81 mg by mouth daily.        . benazepril (LOTENSIN) 10 MG tablet TAKE 1 TABLET DAILY  90 tablet  3  . sildenafil (VIAGRA) 100 MG tablet Take 1 tablet (100 mg total) by mouth daily as needed.  10 tablet  3  . verapamil (VERELAN PM) 180 MG 24 hr capsule Take 1 capsule (180 mg total) by mouth daily.  90 capsule  3   No current facility-administered medications for this visit.     Allergies (verified) Review of patient's allergies indicates no known allergies.   PAST HISTORY  Family History Family History  Problem Relation Age of Onset  .  Colon cancer    . Stroke Mother     Social History History  Substance Use Topics  . Smoking status: Former Smoker    Quit date: 01/14/1985  . Smokeless tobacco: Not on file  . Alcohol Use: No    Are there smokers in your home (other than you)?  No  Risk Factors Current exercise habits: Home exercise routine includes treadmill.  Dietary issues discussed: none   Cardiac risk factors: advanced age (older than 46 for men, 87 for women) and dyslipidemia.  Depression Screen (Note: if answer to either of the following is "  Yes", a more complete depression screening is indicated)   Q1: Over the past two weeks, have you felt down, depressed or hopeless? No  Q2: Over the past two weeks, have you felt little interest or pleasure in doing things? No  Have you lost interest or pleasure in daily life? No  Do you often feel hopeless? No  Do you cry easily over simple problems? No  Activities of Daily Living In your present state of health, do you have any difficulty performing the following activities?:  Driving? No Managing money?  No Feeding yourself? No Getting from bed to chair? No Climbing a flight of stairs? No Preparing food and eating?: No Bathing or showering? No Getting dressed: No Getting to the toilet? No Using the toilet:No Moving around from place to place: No In the past year have you fallen or had a near fall?:No   Are you sexually active?  Yes  Do you have more than one partner?  No  Hearing Difficulties: No Do you often ask people to speak up or repeat themselves? No Do you experience ringing or noises in your ears? No Do you have difficulty understanding soft or whispered voices? No   Do you feel that you have a problem with memory? Yes  Do you often misplace items? No  Do you feel safe at home?  Yes  Cognitive Testing  Alert? Yes  Normal Appearance?Yes  Oriented to person? Yes  Place? Yes   Time? Yes  Recall of three objects?  Yes  Can perform simple  calculations? Yes  Displays appropriate judgment?Yes  Can read the correct time from a watch face?Yes   Advanced Directives have been discussed with the patient? Yes   List the Names of Other Physician/Practitioners you currently use: 1.    Indicate any recent Medical Services you may have received from other than [facility] providers in the past year (date may be approximate).  Immunization History  Administered Date(s) Administered  . Influenza Split 11/27/2010, 12/19/2011  . Influenza Whole 10/20/2008, 11/23/2009  . Td 07/14/2000  . Tdap 08/26/2011    Screening Tests Health Maintenance  Topic Date Due  . Zostavax  10/20/2004  . Pneumococcal Polysaccharide Vaccine Age 101 And Over  10/20/2009  . Influenza Vaccine  09/14/2012  . Colonoscopy  10/13/2019  . Tetanus/tdap  08/25/2021    All answers were reviewed with the patient and necessary referrals were made:  Carrie Mew, MD   09/02/2012   History reviewed: allergies, current medications, past family history, past medical history, past social history, past surgical history and problem list  Review of Systems Pertinent items are noted in HPI.    Objective:     Vision by Snellen chart: right eye:20/20, left eye:20/20 Blood pressure 122/78, pulse 72, temperature 98.6 F (37 C), resp. rate 16, height 5\' 11"  (1.803 m), weight 182 lb (82.555 kg). Body mass index is 25.4 kg/(m^2). Exam per problem focused note     Assessment:      Patient presents for yearly preventative medicine examination.   all immunizations and health maintenance protocols were reviewed with the patient and they are up to date with these protocols.   screening laboratory values were reviewed with the patient including screening of hyperlipidemia PSA renal function and hepatic function.   There medications past medical history social history problem list and allergies were reviewed in detail.   Goals were established with regard to weight  loss exercise diet in compliance with medications  Plan:     During the course of the visit the patient was educated and counseled about appropriate screening and preventive services including:    Influenza vaccine  Prostate cancer screening  Diet review for nutrition referral? Yes ____  Not Indicated __x__   Patient Instructions (the written plan) was given to the patient.  Medicare Attestation I have personally reviewed: The patient's medical and social history Their use of alcohol, tobacco or illicit drugs Their current medications and supplements The patient's functional ability including ADLs,fall risks, home safety risks, cognitive, and hearing and visual impairment Diet and physical activities Evidence for depression or mood disorders  The patient's weight, height, BMI, and visual acuity have been recorded in the chart.  I have made referrals, counseling, and provided education to the patient based on review of the above and I have provided the patient with a written personalized care plan for preventive services.     Carrie Mew, MD   09/02/2012

## 2012-09-01 NOTE — Assessment & Plan Note (Signed)
Stable HTN on two drugs with history of stable renal function bmet drawn today to confirm

## 2012-09-02 ENCOUNTER — Ambulatory Visit (INDEPENDENT_AMBULATORY_CARE_PROVIDER_SITE_OTHER): Payer: Medicare Other | Admitting: Internal Medicine

## 2012-09-02 ENCOUNTER — Encounter: Payer: Self-pay | Admitting: Internal Medicine

## 2012-09-02 VITALS — BP 122/78 | HR 72 | Temp 98.6°F | Resp 16 | Ht 71.0 in | Wt 182.0 lb

## 2012-09-02 DIAGNOSIS — Z0000 Encounter for general adult medical examination without abnormal findings: Secondary | ICD-10-CM

## 2012-09-02 DIAGNOSIS — I10 Essential (primary) hypertension: Secondary | ICD-10-CM

## 2012-09-02 DIAGNOSIS — E039 Hypothyroidism, unspecified: Secondary | ICD-10-CM

## 2012-09-02 DIAGNOSIS — T887XXA Unspecified adverse effect of drug or medicament, initial encounter: Secondary | ICD-10-CM

## 2012-09-02 DIAGNOSIS — N401 Enlarged prostate with lower urinary tract symptoms: Secondary | ICD-10-CM

## 2012-09-02 DIAGNOSIS — N138 Other obstructive and reflux uropathy: Secondary | ICD-10-CM

## 2012-09-02 DIAGNOSIS — E785 Hyperlipidemia, unspecified: Secondary | ICD-10-CM

## 2012-09-02 LAB — BASIC METABOLIC PANEL
BUN: 8 mg/dL (ref 6–23)
Calcium: 9.3 mg/dL (ref 8.4–10.5)
GFR: 91.57 mL/min (ref >60.00)
Glucose, Bld: 90 mg/dL (ref 70–99)
Potassium: 4.2 mEq/L (ref 3.5–5.1)
Sodium: 137 mEq/L (ref 135–145)

## 2012-09-02 LAB — CBC WITH DIFFERENTIAL/PLATELET
Basophils Absolute: 0.1 10*3/uL (ref 0.0–0.1)
Basophils Relative: 0.9 % (ref 0.0–3.0)
Eosinophils Absolute: 0.4 10*3/uL (ref 0.0–0.7)
Lymphocytes Relative: 19.4 % (ref 12.0–46.0)
MCHC: 33.2 g/dL (ref 30.0–36.0)
MCV: 83.3 fl (ref 78.0–100.0)
Monocytes Absolute: 0.7 10*3/uL (ref 0.1–1.0)
Neutrophils Relative %: 60.4 % (ref 43.0–77.0)
Platelets: 194 10*3/uL (ref 150.0–400.0)
RDW: 17.5 % — ABNORMAL HIGH (ref 11.5–14.6)

## 2012-09-02 LAB — POCT URINALYSIS DIPSTICK
Glucose, UA: NEGATIVE
Leukocytes, UA: NEGATIVE
Protein, UA: NEGATIVE

## 2012-09-02 LAB — HEPATIC FUNCTION PANEL
AST: 20 U/L (ref 0–37)
Albumin: 4 g/dL (ref 3.5–5.2)
Total Bilirubin: 0.8 mg/dL (ref 0.3–1.2)

## 2012-09-02 LAB — PSA: PSA: 1.8 ng/mL (ref 0.10–4.00)

## 2012-09-02 LAB — TSH: TSH: 1.6 u[IU]/mL (ref 0.35–5.50)

## 2012-09-02 LAB — LIPID PANEL
Cholesterol: 159 mg/dL (ref 0–200)
HDL: 46.3 mg/dL (ref >39.00)
LDL Cholesterol: 99 mg/dL (ref 0–99)
Triglycerides: 70 mg/dL (ref 0.0–149.0)
VLDL: 14 mg/dL (ref 0.0–40.0)

## 2012-09-02 NOTE — Patient Instructions (Signed)
Patient will take either Claritin or Zyrtec 1 pill at bedtime for the next 2 weeks to see if this both extinguishes to throat clearing and of the cough if the cough persists we will need to change his ACE inhibitor to another drug.  Monitor symptoms for the next 2 weeks on the anti-histamine if the cough does not resolve notify our office via my chart and we will change her blood pressure medication

## 2012-10-09 ENCOUNTER — Other Ambulatory Visit: Payer: Self-pay | Admitting: Internal Medicine

## 2013-03-05 ENCOUNTER — Ambulatory Visit (INDEPENDENT_AMBULATORY_CARE_PROVIDER_SITE_OTHER): Payer: Medicare HMO | Admitting: Internal Medicine

## 2013-03-05 ENCOUNTER — Encounter: Payer: Self-pay | Admitting: Internal Medicine

## 2013-03-05 VITALS — BP 122/76 | HR 68 | Temp 98.4°F | Resp 17 | Ht 72.0 in | Wt 180.0 lb

## 2013-03-05 DIAGNOSIS — Z23 Encounter for immunization: Secondary | ICD-10-CM

## 2013-03-05 DIAGNOSIS — I10 Essential (primary) hypertension: Secondary | ICD-10-CM

## 2013-03-05 DIAGNOSIS — D229 Melanocytic nevi, unspecified: Secondary | ICD-10-CM

## 2013-03-05 DIAGNOSIS — D239 Other benign neoplasm of skin, unspecified: Secondary | ICD-10-CM

## 2013-03-05 DIAGNOSIS — T887XXA Unspecified adverse effect of drug or medicament, initial encounter: Secondary | ICD-10-CM

## 2013-03-05 LAB — BASIC METABOLIC PANEL
BUN: 11 mg/dL (ref 6–23)
CHLORIDE: 104 meq/L (ref 96–112)
CO2: 27 mEq/L (ref 19–32)
CREATININE: 0.8 mg/dL (ref 0.4–1.5)
Calcium: 9.7 mg/dL (ref 8.4–10.5)
GFR: 97.82 mL/min (ref >60.00)
GLUCOSE: 93 mg/dL (ref 70–99)
Potassium: 4.8 mEq/L (ref 3.5–5.1)
Sodium: 138 mEq/L (ref 135–145)

## 2013-03-05 MED ORDER — SILDENAFIL CITRATE 20 MG PO TABS: 20.0000 mg | ORAL_TABLET | Freq: Every day | ORAL | Status: DC | PRN

## 2013-03-05 MED ORDER — VERAPAMIL HCL ER 180 MG PO CP24: 180.0000 mg | ORAL_CAPSULE | Freq: Every day | ORAL | Status: DC

## 2013-03-05 MED ORDER — BENAZEPRIL HCL 10 MG PO TABS: 10.0000 mg | ORAL_TABLET | Freq: Every day | ORAL | Status: DC

## 2013-03-05 NOTE — Addendum Note (Signed)
Addended by: Allyne Gee on: 03/05/2013 01:14 PM   Modules accepted: Orders

## 2013-03-05 NOTE — Patient Instructions (Signed)
The patient is instructed to continue all medications as prescribed. Schedule followup with check out clerk upon leaving the clinic  

## 2013-03-05 NOTE — Progress Notes (Signed)
Subjective:    Patient ID: Noah King, male    DOB: 12-20-1945, 68 y.o.   MRN: [id number]  HPI Patient is a 68 year old male who presents for his six-month followup of hypertension.  His blood pressure is stable his current medications his weight is stable he is up-to-date with all health maintenance issues He has had shingles but he is due the pneumonia shot  Review of Systems  Constitutional: Negative for fever and fatigue.  HENT: Negative for congestion, hearing loss and postnasal drip.   Eyes: Negative for discharge, redness and visual disturbance.  Respiratory: Negative for cough, shortness of breath and wheezing.   Cardiovascular: Negative for leg swelling.  Gastrointestinal: Negative for abdominal pain, constipation and abdominal distention.  Genitourinary: Negative for urgency and frequency.  Musculoskeletal: Negative for arthralgias, joint swelling and neck pain.  Skin: Negative for color change and rash.  Neurological: Negative for weakness and light-headedness.  Hematological: Negative for adenopathy.  Psychiatric/Behavioral: Negative for behavioral problems.   Past Medical History  Diagnosis Date  . Hiatal hernia   . Polycythemia   . Hypertension     History   Social History  . Marital Status: Single    Spouse Name: N/A    Number of Children: N/A  . Years of Education: N/A   Occupational History  . Not on file.   Social History Main Topics  . Smoking status: Former Smoker    Quit date: 01/14/1985  . Smokeless tobacco: Not on file  . Alcohol Use: No  . Drug Use: No  . Sexual Activity: Yes   Other Topics Concern  . Not on file   Social History Narrative  . No narrative on file    Past Surgical History  Procedure Laterality Date  . Rt knee arthroscopic    . Colonoscopy    . Cataract extraction left eye      Family History  Problem Relation Age of Onset  . Colon cancer    . Stroke Mother     No Known Allergies  Current  Outpatient Prescriptions on File Prior to Visit  Medication Sig Dispense Refill  . aspirin 81 MG tablet Take 81 mg by mouth daily.        . benazepril (LOTENSIN) 10 MG tablet TAKE 1 TABLET DAILY  90 tablet  3  . sildenafil (VIAGRA) 100 MG tablet Take 1 tablet (100 mg total) by mouth daily as needed.  10 tablet  3  . verapamil (VERELAN PM) 180 MG 24 hr capsule TAKE 1 CAPSULE DAILY  90 capsule  3   No current facility-administered medications on file prior to visit.           Objective:   Physical Exam  Constitutional: He is oriented to person, place, and time. He appears well-nourished.  HENT:  Head: Normocephalic and atraumatic.  Eyes: Conjunctivae are normal.  Cardiovascular: Normal rate and regular rhythm.   Murmur heard. Pulmonary/Chest: Effort normal and breath sounds normal.  Abdominal: Soft. Bowel sounds are normal.  Neurological: He is alert and oriented to person, place, and time.  Skin: Skin is warm and dry.          Assessment & Plan:  Blood pressure stable on current medications medications are sent into new mail order pharmacy 90 day supplies were given.  Due to cost we will change his Viagra to generic revatio 2 tablets as needed when necessary.  A basic metabolic panel will be obtained today and a pneumonia shot  will be given to you return in 6 months for his wellness examination

## 2013-03-08 ENCOUNTER — Telehealth: Payer: Self-pay | Admitting: Internal Medicine

## 2013-03-08 NOTE — Telephone Encounter (Signed)
Relevant patient education assigned to patient using Emmi. ° °

## 2013-03-10 ENCOUNTER — Other Ambulatory Visit: Payer: Self-pay | Admitting: *Deleted

## 2013-10-01 ENCOUNTER — Encounter: Payer: Medicare HMO | Admitting: Internal Medicine

## 2013-10-01 ENCOUNTER — Ambulatory Visit: Payer: Medicare HMO | Admitting: Family

## 2013-10-26 ENCOUNTER — Ambulatory Visit (INDEPENDENT_AMBULATORY_CARE_PROVIDER_SITE_OTHER): Payer: Medicare HMO | Admitting: Family

## 2013-10-26 ENCOUNTER — Encounter: Payer: Self-pay | Admitting: Family

## 2013-10-26 VITALS — BP 112/60 | HR 59 | Ht 70.25 in | Wt 181.8 lb

## 2013-10-26 DIAGNOSIS — Z0000 Encounter for general adult medical examination without abnormal findings: Secondary | ICD-10-CM

## 2013-10-26 DIAGNOSIS — I10 Essential (primary) hypertension: Secondary | ICD-10-CM

## 2013-10-26 DIAGNOSIS — Z125 Encounter for screening for malignant neoplasm of prostate: Secondary | ICD-10-CM

## 2013-10-26 DIAGNOSIS — R2681 Unsteadiness on feet: Secondary | ICD-10-CM

## 2013-10-26 LAB — CBC WITH DIFFERENTIAL/PLATELET
BASOS ABS: 0 10*3/uL (ref 0.0–0.1)
Basophils Relative: 0.7 % (ref 0.0–3.0)
EOS ABS: 0.4 10*3/uL (ref 0.0–0.7)
EOS PCT: 6.1 % — AB (ref 0.0–5.0)
HCT: 37.9 % — ABNORMAL LOW (ref 39.0–52.0)
Hemoglobin: 12.1 g/dL — ABNORMAL LOW (ref 13.0–17.0)
LYMPHS ABS: 1 10*3/uL (ref 0.7–4.0)
Lymphocytes Relative: 17.8 % (ref 12.0–46.0)
MCHC: 32 g/dL (ref 30.0–36.0)
MCV: 81.3 fl (ref 78.0–100.0)
MONO ABS: 0.7 10*3/uL (ref 0.1–1.0)
Monocytes Relative: 12.8 % — ABNORMAL HIGH (ref 3.0–12.0)
NEUTROS PCT: 62.6 % (ref 43.0–77.0)
Neutro Abs: 3.6 10*3/uL (ref 1.4–7.7)
PLATELETS: 206 10*3/uL (ref 150.0–400.0)
RBC: 4.66 Mil/uL (ref 4.22–5.81)
RDW: 19.4 % — AB (ref 11.5–15.5)
WBC: 5.8 10*3/uL (ref 4.0–10.5)

## 2013-10-26 LAB — HEPATIC FUNCTION PANEL
ALBUMIN: 3.8 g/dL (ref 3.5–5.2)
ALT: 26 U/L (ref 0–53)
AST: 21 U/L (ref 0–37)
Alkaline Phosphatase: 45 U/L (ref 39–117)
Bilirubin, Direct: 0.2 mg/dL (ref 0.0–0.3)
TOTAL PROTEIN: 7.4 g/dL (ref 6.0–8.3)
Total Bilirubin: 1 mg/dL (ref 0.2–1.2)

## 2013-10-26 LAB — BASIC METABOLIC PANEL
BUN: 10 mg/dL (ref 6–23)
CO2: 26 mEq/L (ref 19–32)
CREATININE: 0.9 mg/dL (ref 0.4–1.5)
Calcium: 9.5 mg/dL (ref 8.4–10.5)
Chloride: 105 mEq/L (ref 96–112)
GFR: 91.26 mL/min (ref >60.00)
Glucose, Bld: 90 mg/dL (ref 70–99)
Potassium: 4.6 mEq/L (ref 3.5–5.1)
Sodium: 138 mEq/L (ref 135–145)

## 2013-10-26 LAB — PSA, MEDICARE: PSA: 1.36 ng/mL (ref 0.10–4.00)

## 2013-10-26 LAB — LIPID PANEL
CHOL/HDL RATIO: 4
CHOLESTEROL: 172 mg/dL (ref 0–200)
HDL: 43.4 mg/dL (ref >39.00)
LDL Cholesterol: 113 mg/dL — ABNORMAL HIGH (ref 0–99)
NonHDL: 128.6
TRIGLYCERIDES: 77 mg/dL (ref 0.0–149.0)
VLDL: 15.4 mg/dL (ref 0.0–40.0)

## 2013-10-26 NOTE — Progress Notes (Signed)
Pre visit review using our clinic review tool, if applicable. No additional management support is needed unless otherwise documented below in the visit note. 

## 2013-10-26 NOTE — Progress Notes (Signed)
Subjective:    Patient ID: Noah King, male    DOB: Sep 14, 1945, 68 y.o.   MRN: [id number]  HPI  68 year old Patient presents for yearly preventative medicine examination. Medicare questionnaire was completed  All immunizations and health maintenance protocols were reviewed with the patient and needed orders were placed.  Appropriate screening laboratory values were ordered for the patient including screening of hyperlipidemia, renal function and hepatic function. If indicated by BPH, a PSA was ordered.  Medication reconciliation,  past medical history, social history, problem list and allergies were reviewed in detail with the patient  Goals were established with regard to weight loss, exercise, and  diet in compliance with medications  End of life planning was discussed.   Has concerns of gait instability although at 4-5 months. Reports is unable to lift 1 foot without feeling off balance. This has gotten progressively worse. Instability typically happen 3-4 times a day  Review of Systems  Constitutional: Negative.   Eyes: Negative.   Respiratory: Negative.   Cardiovascular: Negative.   Gastrointestinal: Negative.   Endocrine: Negative.   Genitourinary: Negative.   Musculoskeletal: Negative.   Skin: Negative.   Allergic/Immunologic: Negative.   Neurological: Negative.  Negative for dizziness, facial asymmetry, speech difficulty and headaches.       Gait unstable x 5 months.   Hematological: Negative.   Psychiatric/Behavioral: Negative.    Past Medical History  Diagnosis Date  . Hiatal hernia   . Polycythemia   . Hypertension     History   Social History  . Marital Status: Single    Spouse Name: N/A    Number of Children: N/A  . Years of Education: N/A   Occupational History  . Not on file.   Social History Main Topics  . Smoking status: Former Smoker    Quit date: 01/14/1985  . Smokeless tobacco: Not on file  . Alcohol Use: No  . Drug Use: No    . Sexual Activity: Yes   Other Topics Concern  . Not on file   Social History Narrative  . No narrative on file    Past Surgical History  Procedure Laterality Date  . Rt knee arthroscopic    . Colonoscopy    . Cataract extraction left eye      Family History  Problem Relation Age of Onset  . Colon cancer    . Stroke Mother     No Known Allergies  Current Outpatient Prescriptions on File Prior to Visit  Medication Sig Dispense Refill  . aspirin 81 MG tablet Take 81 mg by mouth daily.        . benazepril (LOTENSIN) 10 MG tablet Take 1 tablet (10 mg total) by mouth daily.  90 tablet  3  . sildenafil (REVATIO) 20 MG tablet Take 1 tablet (20 mg total) by mouth daily as needed.  30 tablet  4  . verapamil (VERELAN PM) 180 MG 24 hr capsule Take 1 capsule (180 mg total) by mouth at bedtime.  90 capsule  3   No current facility-administered medications on file prior to visit.    BP 112/60  Pulse 59  Ht 5' 10.25" (1.784 m)  Wt 181 lb 12.8 oz (82.464 kg)  BMI 25.91 kg/m2chart    Objective:   Physical Exam  Constitutional: He is oriented to person, place, and time. He appears well-developed and well-nourished.  HENT:  Head: Normocephalic and atraumatic.  Right Ear: External ear normal.  Left Ear: External ear  normal.  Nose: Nose normal.  Mouth/Throat: Oropharynx is clear and moist.  Eyes: Conjunctivae and EOM are normal. Pupils are equal, round, and reactive to light.  Neck: Normal range of motion. Neck supple. No thyromegaly present.  Cardiovascular: Normal rate, regular rhythm, normal heart sounds and intact distal pulses.   Pulmonary/Chest: Effort normal and breath sounds normal.  Abdominal: Soft. Bowel sounds are normal.  Genitourinary: Prostate normal and penis normal. Guaiac negative stool. No penile tenderness.  Musculoskeletal: Normal range of motion. He exhibits no edema and no tenderness.  Neurological: He is alert and oriented to person, place, and time. He has  normal reflexes. He displays normal reflexes. No cranial nerve deficit. Coordination normal.  Skin: Skin is warm and dry.  Psychiatric: He has a normal mood and affect.          Assessment & Plan:  T[place]on was seen today for establish care.  Diagnoses and associated orders for this visit:  Medicare annual wellness visit, subsequent - Ambulatory referral to Neurology - CBC with Differential - PSA, Medicare - Hepatic Function Panel - Basic Metabolic Panel - Lipid Panel - EKG 12-Lead  Essential hypertension - Ambulatory referral to Neurology - CBC with Differential - PSA, Medicare - Hepatic Function Panel - Basic Metabolic Panel - Lipid Panel - EKG 12-Lead  Unstable gait - Ambulatory referral to Neurology - CBC with Differential - PSA, Medicare - Hepatic Function Panel - Basic Metabolic Panel - Lipid Panel - EKG 12-Lead   Call the office with any questions or concerns. Recheck as scheduled and in 6 months. Encouraged a healthy diet and exercise.

## 2013-10-26 NOTE — Patient Instructions (Addendum)

## 2013-12-14 ENCOUNTER — Encounter: Payer: Self-pay | Admitting: Neurology

## 2013-12-14 ENCOUNTER — Ambulatory Visit (INDEPENDENT_AMBULATORY_CARE_PROVIDER_SITE_OTHER): Payer: Medicare HMO | Admitting: Neurology

## 2013-12-14 VITALS — BP 138/60 | HR 80 | Temp 97.5°F | Resp 16 | Ht 71.0 in | Wt 186.6 lb

## 2013-12-14 DIAGNOSIS — R2681 Unsteadiness on feet: Secondary | ICD-10-CM

## 2013-12-14 NOTE — Progress Notes (Signed)
NEUROLOGY CONSULTATION NOTE  Noah King MRN: [id number] DOB: 1945/11/07  Referring provider: Roxy Cedar, FNP Primary care provider: Roxy Cedar, FNP  Reason for consult:  Unstable gait.  HISTORY OF PRESENT ILLNESS: Noah King is a 68 year old right-handed man with hypertension and polycythemia who presents for unstable gait.  Records and labs reviewed.  Symptoms have gradually progressed off and on over the past 2 years.  Sometimes, he feels that he staggers while walking.  He has not had any falls, however.  He feels that he is unbalanced.  For example, he needs to hold onto the wall when washing his hair in the shower.  He feels unsteady now when he is fishing in [employer].  He is now unable to put on his shoes while standing on one foot at a time.  He does have episodes of positional vertigo, but this is completely unrelated.  He denies neck, back or leg pain.  He denies numbness in the feet.  10/26/13 LABS:  WBC 5.8, HGB 12.1, HCT 37.9, PLT 206, Na 138, K 4.6, Cl 105, CO2 26, glucose 90, BUN 10, Cr 0.9, TB 1, ALP 45, AST 21, ALT 26, TP 7.4, Chol 172, TG 77, HDL 43.40, LDL 113.  PAST MEDICAL HISTORY: Past Medical History  Diagnosis Date  . Hiatal hernia   . Polycythemia   . Hypertension     PAST SURGICAL HISTORY: Past Surgical History  Procedure Laterality Date  . Rt knee arthroscopic    . Colonoscopy    . Cataract extraction left eye      MEDICATIONS: Current Outpatient Prescriptions on File Prior to Visit  Medication Sig Dispense Refill  . aspirin 81 MG tablet Take 81 mg by mouth daily.      . benazepril (LOTENSIN) 10 MG tablet Take 1 tablet (10 mg total) by mouth daily. 90 tablet 3  . verapamil (VERELAN PM) 180 MG 24 hr capsule Take 1 capsule (180 mg total) by mouth at bedtime. 90 capsule 3  . sildenafil (REVATIO) 20 MG tablet Take 1 tablet (20 mg total) by mouth daily as needed. (Patient not taking: Reported on 12/14/2013) 30 tablet 4   No  current facility-administered medications on file prior to visit.    ALLERGIES: No Known Allergies  FAMILY HISTORY: Family History  Problem Relation Age of Onset  . Colon cancer Father   . Stroke Mother   . Cancer Sister     breast     SOCIAL HISTORY: History   Social History  . Marital Status: Single    Spouse Name: N/A    Number of Children: N/A  . Years of Education: N/A   Occupational History  . Not on file.   Social History Main Topics  . Smoking status: Former Smoker    Quit date: 01/14/1985  . Smokeless tobacco: Not on file  . Alcohol Use: Yes     Comment: beer daily   . Drug Use: No  . Sexual Activity:    Partners: Female   Other Topics Concern  . Not on file   Social History Narrative    REVIEW OF SYSTEMS: Constitutional: No fevers, chills, or sweats, no generalized fatigue, change in appetite Eyes: No visual changes, double vision, eye pain Ear, nose and throat: No hearing loss, ear pain, nasal congestion, sore throat Cardiovascular: No chest pain, palpitations Respiratory:  No shortness of breath at rest or with exertion, wheezes GastrointestinaI: No nausea, vomiting, diarrhea, abdominal pain, fecal incontinence Genitourinary:  No dysuria, urinary retention or frequency Musculoskeletal:  No neck pain, back pain Integumentary: No rash, pruritus, skin lesions Neurological: as above Psychiatric: No depression, insomnia, anxiety Endocrine: No palpitations, fatigue, diaphoresis, mood swings, change in appetite, change in weight, increased thirst Hematologic/Lymphatic:  No anemia, purpura, petechiae. Allergic/Immunologic: no itchy/runny eyes, nasal congestion, recent allergic reactions, rashes  PHYSICAL EXAM: Filed Vitals:   12/14/13 0912  BP: 138/60  Pulse: 80  Temp: 97.5 F (36.4 C)  Resp: 16   General: No acute distress Head:  Normocephalic/atraumatic Eyes:  fundi unremarkable, without vessel changes, exudates, hemorrhages or  papilledema. Neck: supple, no paraspinal tenderness, full range of motion Back: No paraspinal tenderness Heart: regular rate and rhythm Lungs: Clear to auscultation bilaterally. Vascular: No carotid bruits. Neurological Exam: Mental status: alert and oriented to person, place, and time, recent and remote memory intact, fund of knowledge intact, attention and concentration intact, speech fluent and not dysarthric, language intact. Cranial nerves: CN I: not tested CN II: pupils equal, round and reactive to light, visual fields intact, fundi unremarkable, without vessel changes, exudates, hemorrhages or papilledema. CN III, IV, VI:  full range of motion, no nystagmus, no ptosis CN V: facial sensation intact CN VII: upper and lower face symmetric CN VIII: decreased hearing on the right. CN IX, X: gag intact, uvula midline CN XI: sternocleidomastoid and trapezius muscles intact CN XII: tongue midline Bulk & Tone: normal, no fasciculations. Motor:  5/5 throughout Sensation:  Reduced pinprick sensation in the feet up to the ankles.  Reduced vibration sensation up to below the knees. Deep Tendon Reflexes:  3+ in the patellars, otherwise 1+ in the upper extremities and absent in the ankles.  Toes equivocal Finger to nose testing:  Postural and kinetic tremor bilaterally but no dysmetria Heel to shin:  No dysmetria Gait:  Cautious wide-based gait.  Cannot tandem walk. Romberg positive.  IMPRESSION: Gait instability.  I suspect an underlying peripheral neuropathy.  He does not exhibit any clear upper motor neuron signs, however since he has brisk reflexes in the patellars, a myelopathy cannot be completely excluded.  PLAN: 1.  We will start with MRI of the cervical spine without contrast to look for any evidence of cervical stenosis. 2.  If MRI unremarkable, then will proceed with neuropathy workup (blood work like B12, ceruloplasmin, copper, TSH, SPEP/IFE, as well as NCV-EMG)  Thank you for  allowing me to take part in the care of this patient.  Metta Clines, DO  CC:  Burgess Amor, FNP

## 2013-12-14 NOTE — Patient Instructions (Addendum)
I suspect the balance problems are related to the nerves in the feet.  However, since you have brisk reflexes in the knees, I want to check MRI of the cervical spine first to rule out pinching of the spinal cord. [facility] 12/30/13 2:45 PM  If that is unremarkable, then we will check for neuropathy, and will then get a nerve conduction study test and blood work done.

## 2013-12-30 ENCOUNTER — Ambulatory Visit (HOSPITAL_COMMUNITY)
Admission: RE | Admit: 2013-12-30 | Discharge: 2013-12-30 | Disposition: A | Discharge status: Home / Self Care | Payer: Medicare HMO | Source: Ambulatory Visit | Attending: Neurology | Admitting: Neurology

## 2013-12-30 DIAGNOSIS — M2578 Osteophyte, vertebrae: Secondary | ICD-10-CM | POA: Diagnosis not present

## 2013-12-30 DIAGNOSIS — R269 Unspecified abnormalities of gait and mobility: Secondary | ICD-10-CM | POA: Insufficient documentation

## 2013-12-30 DIAGNOSIS — Z8673 Personal history of transient ischemic attack (TIA), and cerebral infarction without residual deficits: Secondary | ICD-10-CM | POA: Insufficient documentation

## 2013-12-30 DIAGNOSIS — R2681 Unsteadiness on feet: Secondary | ICD-10-CM

## 2013-12-30 DIAGNOSIS — M4802 Spinal stenosis, cervical region: Secondary | ICD-10-CM | POA: Diagnosis not present

## 2014-01-10 ENCOUNTER — Other Ambulatory Visit: Payer: Self-pay | Admitting: *Deleted

## 2014-01-10 ENCOUNTER — Telehealth: Payer: Self-pay | Admitting: *Deleted

## 2014-01-10 DIAGNOSIS — G629 Polyneuropathy, unspecified: Secondary | ICD-10-CM

## 2014-01-10 NOTE — Telephone Encounter (Signed)
-----   Message from Dudley Major, DO sent at 12/31/2013  6:38 AM EST ----- MRI of cervical spine reveals no specific cause of gait instability.  Therefore, I would like to proceed with neuropathy workup (blood work like B12, ceruloplasmin, copper, TSH, SPEP/IFE, as well as NCV-EMG) ----- Message -----    From: Rad Results In Interface    Sent: 12/30/2013   6:22 PM      To: Dudley Major, DO

## 2014-01-10 NOTE — Telephone Encounter (Signed)
Patient is aware left  message to come pick up lab slips and schedule EMG

## 2014-01-14 LAB — VITAMIN B12: Vitamin B-12: 414 pg/mL (ref 211–911)

## 2014-01-14 LAB — TSH: TSH: 0.827 u[IU]/mL (ref 0.350–4.500)

## 2014-01-16 LAB — COPPER, SERUM: Copper: 115 ug/dL (ref 70–175)

## 2014-01-17 LAB — CERULOPLASMIN: Ceruloplasmin: 31 mg/dL (ref 18–36)

## 2014-01-18 ENCOUNTER — Telehealth: Payer: Self-pay | Admitting: *Deleted

## 2014-01-18 LAB — SPEP & IFE WITH QIG
ALPHA-2-GLOBULIN: 9.3 % (ref 7.1–11.8)
Albumin ELP: 57.7 % (ref 55.8–66.1)
Alpha-1-Globulin: 5 % — ABNORMAL HIGH (ref 2.9–4.9)
Beta 2: 4.8 % (ref 3.2–6.5)
Beta Globulin: 7.7 % — ABNORMAL HIGH (ref 4.7–7.2)
GAMMA GLOBULIN: 15.5 % (ref 11.1–18.8)
IGA: 330 mg/dL (ref 68–379)
IGG (IMMUNOGLOBIN G), SERUM: 1100 mg/dL (ref 650–1600)
IgM, Serum: 68 mg/dL (ref 41–251)
Total Protein, Serum Electrophoresis: 7.2 g/dL (ref 6.0–8.3)

## 2014-01-18 NOTE — Telephone Encounter (Signed)
All labs are normal. 

## 2014-01-18 NOTE — Telephone Encounter (Signed)
-----   Message from Dudley Major, DO sent at 01/17/2014  6:43 AM EST ----- Copper level, thyroid and vitamin B12 levels look okay ----- Message -----    From: Lab in [facility] Interface    Sent: 01/14/2014   1:25 AM      To: Dudley Major, DO

## 2014-01-19 ENCOUNTER — Telehealth: Payer: Self-pay | Admitting: *Deleted

## 2014-01-19 NOTE — Telephone Encounter (Signed)
aware of normal labs

## 2014-04-27 ENCOUNTER — Ambulatory Visit: Payer: Medicare HMO | Admitting: Family

## 2014-05-02 ENCOUNTER — Telehealth: Payer: Self-pay | Admitting: Family

## 2014-05-04 ENCOUNTER — Telehealth: Payer: Self-pay | Admitting: Family

## 2014-05-04 ENCOUNTER — Encounter: Payer: Self-pay | Admitting: Adult Health

## 2014-05-04 ENCOUNTER — Ambulatory Visit (INDEPENDENT_AMBULATORY_CARE_PROVIDER_SITE_OTHER): Payer: Medicare HMO | Admitting: Adult Health

## 2014-05-04 VITALS — BP 132/84 | Temp 98.2°F | Wt 183.5 lb

## 2014-05-04 DIAGNOSIS — I10 Essential (primary) hypertension: Secondary | ICD-10-CM | POA: Diagnosis not present

## 2014-05-04 DIAGNOSIS — R269 Unspecified abnormalities of gait and mobility: Secondary | ICD-10-CM | POA: Diagnosis not present

## 2014-05-04 DIAGNOSIS — Z23 Encounter for immunization: Secondary | ICD-10-CM

## 2014-05-04 MED ORDER — BENAZEPRIL HCL 10 MG PO TABS: 10.0000 mg | ORAL_TABLET | Freq: Every day | ORAL | Status: DC

## 2014-05-04 MED ORDER — VERAPAMIL HCL ER 180 MG PO CP24: 180.0000 mg | ORAL_CAPSULE | Freq: Every day | ORAL | Status: DC

## 2014-05-04 MED ORDER — SILDENAFIL CITRATE 20 MG PO TABS: 20.0000 mg | ORAL_TABLET | Freq: Every day | ORAL | Status: DC | PRN

## 2014-05-04 NOTE — Progress Notes (Addendum)
Subjective:    Patient ID: Noah King, male    DOB: 1945-07-23, 69 y.o.   MRN: [id number]  HPI  Patient presents to the office today for six month follow up. He currently has no complaints. He does need his medications refilled. He informed me that he saw a neurologist and had blood work and an MRI done for his unsteady gait. All of this exams were negative. He continues to have unsteady gate and that he sometimes gets dizzy in the morning when he gets out of bed. Denies any other symptoms, he has not fallen.   His unsteady gait has been going on for two years.    Review of Systems  Constitutional: Negative.   HENT: Negative.   Musculoskeletal: Positive for gait problem. Negative for myalgias, back pain, joint swelling, arthralgias, neck pain and neck stiffness.  All other systems reviewed and are negative.  Past Medical History  Diagnosis Date  . Hiatal hernia   . Polycythemia   . Hypertension     History   Social History  . Marital Status: Single    Spouse Name: N/A  . Number of Children: N/A  . Years of Education: N/A   Occupational History  . Not on file.   Social History Main Topics  . Smoking status: Former Smoker    Quit date: 01/14/1985  . Smokeless tobacco: Not on file  . Alcohol Use: Yes     Comment: beer daily   . Drug Use: No  . Sexual Activity:    Partners: Female   Other Topics Concern  . Not on file   Social History Narrative    Past Surgical History  Procedure Laterality Date  . Rt knee arthroscopic    . Colonoscopy    . Cataract extraction left eye      Family History  Problem Relation Age of Onset  . Colon cancer Father   . Stroke Mother   . Cancer Sister     breast     No Known Allergies  Current Outpatient Prescriptions on File Prior to Visit  Medication Sig Dispense Refill  . aspirin 81 MG tablet Take 81 mg by mouth daily.       No current facility-administered medications on file prior to visit.    BP 132/84  mmHg  Temp(Src) 98.2 F (36.8 C) (Oral)  Wt 183 lb 8 oz (83.235 kg)       Objective:   Physical Exam  Constitutional: He is oriented to person, place, and time. He appears well-developed and well-nourished. No distress.  HENT:  Head: Normocephalic and atraumatic.  Mouth/Throat: No oropharyngeal exudate.  Eyes: Right eye exhibits no discharge. Left eye exhibits no discharge.  Neck: Normal range of motion.  Cardiovascular: Normal rate, regular rhythm, normal heart sounds and intact distal pulses.   Pulmonary/Chest: Effort normal and breath sounds normal. No respiratory distress. He has no wheezes. He has no rales. He exhibits no tenderness.  Neurological: He is alert and oriented to person, place, and time. No cranial nerve deficit. Coordination normal.  Walks with limp, is able to climb from chair to exam table without difficulty  Skin: Skin is warm and dry.  Psychiatric: He has a normal mood and affect. His behavior is normal. Judgment and thought content normal.  Nursing note and vitals reviewed.      Assessment & Plan:  1. Need for Streptococcus pneumoniae vaccination - Pneumococcal conjugate vaccine 13-valent IM  2. Essential hypertension  - benazepril (  LOTENSIN) 10 MG tablet; Take 1 tablet (10 mg total) by mouth daily.  Dispense: 90 tablet; Refill: 3 - verapamil (VERELAN PM) 180 MG 24 hr capsule; Take 1 capsule (180 mg total) by mouth at bedtime.  Dispense: 90 capsule; Refill: 3 - Advised to take blood pressure first thing in the morning before getting out of bed. Write the results in a log and inform me if his blood pressure is lower than 350 systolic.  - Follow up in three months to establish care, sooner if needed.   3. Unsteady gait - Referral to PT - Follow up if gate becomes more unsteady or he falls.

## 2014-05-04 NOTE — Telephone Encounter (Signed)
Rx sent for verapamil (VERELAN PM) 180 MG 24 hr capsule  Pharm states the verapamil (VERELAN PM) 24 hr capsule  Only comes in 100, 200 and 300 mg.  What do you suggest to switch to?

## 2014-05-04 NOTE — Patient Instructions (Signed)
All your refills have been sent to the pharmacy. I also placed a referral to PT, I would advise to go and see what they can do for you. Follow up with me in three months to establish care.

## 2014-05-04 NOTE — Telephone Encounter (Signed)
Pls advise.  

## 2014-05-04 NOTE — Progress Notes (Signed)
Pre visit review using our clinic review tool, if applicable. No additional management support is needed unless otherwise documented below in the visit note. 

## 2014-05-05 ENCOUNTER — Other Ambulatory Visit: Payer: Self-pay | Admitting: Adult Health

## 2014-05-05 DIAGNOSIS — I10 Essential (primary) hypertension: Secondary | ICD-10-CM

## 2014-05-05 MED ORDER — VERAPAMIL HCL ER 180 MG PO TBCR: 180.0000 mg | EXTENDED_RELEASE_TABLET | Freq: Every day | ORAL | Status: DC

## 2014-05-05 NOTE — Telephone Encounter (Signed)
Sent in new script for the same medication. It looks like the name changed. He should be able to get it now.

## 2014-05-06 NOTE — Telephone Encounter (Signed)
error 

## 2014-05-06 NOTE — Telephone Encounter (Signed)
Noted. Called and left a message for pt to call office if further assistance is needed.

## 2014-05-24 ENCOUNTER — Ambulatory Visit: Payer: Medicare HMO | Attending: Adult Health

## 2014-05-24 DIAGNOSIS — R262 Difficulty in walking, not elsewhere classified: Secondary | ICD-10-CM | POA: Diagnosis present

## 2014-05-24 DIAGNOSIS — R269 Unspecified abnormalities of gait and mobility: Secondary | ICD-10-CM

## 2014-05-24 DIAGNOSIS — R29898 Other symptoms and signs involving the musculoskeletal system: Secondary | ICD-10-CM

## 2014-05-24 NOTE — Therapy (Signed)
[facility] [address], [place], [place], [address] Phone: [phone]   Fax:  [phone]  Physical Therapy Evaluation  Patient Details  Name: Noah King MRN: [id number] Date of Birth: 1945-10-10 Referring Provider:  [name], NP  Encounter Date: 05/24/2014      PT End of Session - 05/24/14 1108    Visit Number 1   Number of Visits 10  Medicare   Date for PT Re-Evaluation 07/19/14   PT Start Time 1021   PT Stop Time 1100   PT Time Calculation (min) 39 min   Activity Tolerance Patient tolerated treatment well   Behavior During Therapy [facility] for tasks assessed/performed      Past Medical History  Diagnosis Date  . Hiatal hernia   . Polycythemia   . Hypertension     Past Surgical History  Procedure Laterality Date  . Rt knee arthroscopic    . Colonoscopy    . Cataract extraction left eye      There were no vitals filed for this visit.  Visit Diagnosis:  Abnormality of gait - Plan: PT plan of care cert/re-cert  Weakness of left leg - Plan: PT plan of care cert/re-cert  Difficulty walking - Plan: PT plan of care cert/re-cert      Subjective Assessment - 05/24/14 1024    Subjective Pt presents to PT with gait abnormality and balance deficits.  Pt reports that balance has been worsening over time.     Limitations Walking   How long can you walk comfortably? limited walking due to balance    Diagnostic tests MRI and labs at neuro MD: negative.     Patient Stated Goals improve balance and gait to reduce falls.     Currently in Pain? No/denies            [facility] PT Assessment - 05/24/14 0001    Assessment   Medical Diagnosis Abnormality of gait  (R26.9)   Onset Date 05/23/12   Next MD Visit 3 months   Prior Therapy none   Precautions   Precautions Fall   Restrictions   Weight Bearing Restrictions No   Balance Screen   Has the patient fallen in the past 6 months No   Has the  patient had a decrease in activity level because of a fear of falling?  Yes   Is the patient reluctant to leave their home because of a fear of falling?  No   Home Environment   Living Enviornment Private residence   [place] to enter   Entrance Stairs-Number of Steps 1   [place] One level   Prior Function   Level of Independence Independent with basic ADLs   Vocation Retired   Leisure gardening, walking (fearful due to balance)   Cognition   Overall Cognitive Status Within Functional Limits for tasks assessed   Observation/Other Assessments   Focus on Therapeutic Outcomes (FOTO)  44% limitation   Posture/Postural Control   Posture/Postural Control Postural limitations   Postural Limitations Forward head   ROM / Strength   AROM / PROM / Strength Strength   Strength   Overall Strength Deficits   Overall Strength Comments Rt LE strength 4+/5 to 5/5 throughout, Lt LE: hip flexion 4/5, and DF 4/5.  All other Lt LE 4+/5 throughout.     Ambulation/Gait   Ambulation/Gait Yes   Ambulation/Gait Assistance 6: Modified independent (Device/Increase time)   Ambulation Distance (  Feet) 100 Feet   Gait Pattern Step-through pattern;Wide base of support;Decreased step length - right;Decreased step length - left;Decreased dorsiflexion - right;Decreased dorsiflexion - left   Ambulation Surface Level   Balance   Balance Assessed Yes   Standardized Balance Assessment   Standardized Balance Assessment Berg Balance Test   Berg Balance Test   Sit to Stand Able to stand without using hands and stabilize independently   Standing Unsupported Able to stand safely 2 minutes   Sitting with Back Unsupported but Feet Supported on Floor or Stool Able to sit safely and securely 2 minutes   Stand to Sit Controls descent by using hands   Transfers Able to transfer safely, definite need of hands   Standing Unsupported with Eyes Closed Able to stand 10 seconds with  supervision   Standing Ubsupported with Feet Together Able to place feet together independently and stand for 1 minute with supervision   From Standing, Reach Forward with Outstretched Arm Can reach forward >12 cm safely (5")   From Standing Position, Pick up Object from [place] to pick up shoe safely and easily   From Standing Position, Turn to Look Behind Over each Shoulder Looks behind from both sides and weight shifts well   Turn 360 Degrees Able to turn 360 degrees safely but slowly   Standing Unsupported, Alternately Place Feet on Step/Stool Able to complete 4 steps without aid or supervision   Standing Unsupported, One Foot in [employer] balance while stepping or standing   Standing on One Leg Tries to lift leg/unable to hold 3 seconds but remains standing independently   Total Score 40                           PT Education - 05/24/14 1059    Education provided Yes   Education Details HEP: standing hip marching, hip abduction, extension   Person(s) Educated Patient   Methods Explanation;Demonstration;Handout   Comprehension Verbalized understanding;Returned demonstration          PT Short Term Goals - 05/24/14 1112    PT SHORT TERM GOAL #1   Title be independent in initial HEP   Time 8   Period Weeks   Status New   PT SHORT TERM GOAL #2   Title improve Berg balance score to > or = to 45/56 to reduce falls risk   Time 4   Period Weeks   Status New           PT Long Term Goals - 05/24/14 1113    PT LONG TERM GOAL #1   Title be independent in advanced HEP   Time 8   Period Weeks   Status New   PT LONG TERM GOAL #2   Title reduce FOTO to < or = to 35% limitation   Time 8   Period Weeks   Status New   PT LONG TERM GOAL #3   Title improve Berg balance score to > or = to 49/56 to reduce falls risk   Time 8   Period Weeks   Status New   PT LONG TERM GOAL #4   Title return to regular walking for exercise without limitation   Time 8    Period Weeks   Status New   PT LONG TERM GOAL #5   Title report a 50% improved confidence with gait in the community   Time 8   Period Weeks   Status New  Plan - May 31, 2014 1109    Clinical Impression Statement Pt is a 69 y.o. male who presents to PT with gait abnormality and balance deficits that began ~2 years ago without cause.  Pt demonstrates wide base of support and unsteady gait with change of direction on level surface. Pt will benefit from PT for balance training, gait and strength progression.     Pt will benefit from skilled therapeutic intervention in order to improve on the following deficits Abnormal gait;Difficulty walking;Decreased activity tolerance;Decreased strength   Rehab Potential Good   PT Frequency 2x / week   PT Duration 8 weeks   PT Treatment/Interventions ADLs/Self Care Home Management;Therapeutic activities;Patient/family education;Therapeutic exercise;Passive range of motion;Gait training;Balance training;Manual techniques;Stair training;Neuromuscular re-education   PT Next Visit Plan Balance training, gait on level surface, LE strength   Consulted and Agree with Plan of Care Patient          G-Codes - May 31, 2014 1019    Functional Assessment Tool Used FOTO: 44% limitation   Functional Limitation Mobility: Walking and moving around   Mobility: Walking and Moving Around Current Status ([id number]) At least 40 percent but less than 60 percent impaired, limited or restricted   Mobility: Walking and Moving Around Goal Status [phone]) At least 20 percent but less than 40 percent impaired, limited or restricted       Problem List Patient Active Problem List   Diagnosis Date Noted  . HERPES ZOSTER 12/13/2009  . POLYCYTHEMIA RUBRA VERA 04/16/2007  . Contracture of palmar fascia 04/16/2007  . HYPOTHYROIDISM 08/29/2006  . HYPERLIPIDEMIA 08/29/2006  . HYPERTENSION 08/29/2006    Azia Toutant, PT 31-May-2014, 11:21 AM  [facility] [address], [place], [place], [address] Phone: [phone]   Fax:  [phone]

## 2014-05-24 NOTE — Patient Instructions (Signed)
Knee High   Holding stable object, raise knee to hip level, then lower knee. Repeat with other knee. Complete _2x_10_ repetitions. Do __2__ sessions per day.  ABDUCTION: Standing (Active)   Stand, feet flat. Lift right leg out to side. Use _0__ lbs. Complete _2x_10_ repetitions. Perform __2_ sessions per day.   EXTENSION: Standing (Active)  Stand, both feet flat. Draw right leg behind body as far as possible. Use 0___ lbs. Complete 2x10 repetitions. Perform __2_ sessions per day.  Copyright  VHI. All rights reserved.

## 2014-05-30 ENCOUNTER — Encounter: Payer: Self-pay | Admitting: Physical Therapy

## 2014-05-30 ENCOUNTER — Ambulatory Visit: Payer: Medicare HMO | Admitting: Physical Therapy

## 2014-05-30 DIAGNOSIS — R29898 Other symptoms and signs involving the musculoskeletal system: Secondary | ICD-10-CM

## 2014-05-30 DIAGNOSIS — R269 Unspecified abnormalities of gait and mobility: Secondary | ICD-10-CM | POA: Diagnosis not present

## 2014-05-30 DIAGNOSIS — R262 Difficulty in walking, not elsewhere classified: Secondary | ICD-10-CM

## 2014-05-30 NOTE — Therapy (Signed)
[facility] [address], [place], [place], [address] Phone: [phone]   Fax:  [phone]  Physical Therapy Treatment  Patient Details  Name: Noah King MRN: [id number] Date of Birth: 17-Apr-1945 Referring Provider:  Kennyth Arnold, FNP  Encounter Date: 05/30/2014      PT End of Session - 05/30/14 1435    Visit Number 2   Number of Visits 10   Date for PT Re-Evaluation 07/19/14   PT Start Time 1400   PT Stop Time 1440   PT Time Calculation (min) 40 min   Activity Tolerance Patient tolerated treatment well   Behavior During Therapy [facility] for tasks assessed/performed      Past Medical History  Diagnosis Date  . Hiatal hernia   . Polycythemia   . Hypertension     Past Surgical History  Procedure Laterality Date  . Rt knee arthroscopic    . Colonoscopy    . Cataract extraction left eye      There were no vitals filed for this visit.  Visit Diagnosis:  Weakness of left leg  Abnormality of gait  Difficulty walking      Subjective Assessment - 05/30/14 1404    Subjective No complaints today.   Currently in Pain? No/denies                         [facility] Adult PT Treatment/Exercise - 05/30/14 0001    High Level Balance   High Level Balance Activities Side stepping;Tandem walking   Knee/Hip Exercises: Aerobic   Stationary Bike L1 x 6 min   Knee/Hip Exercises: Standing   Heel Raises 10 reps   Forward Step Up Both;1 set;10 reps;Hand Hold: 1   Rebounder 3 way weight shifting 1 min each   Other Standing Knee Exercises Standing 3 way kicks 10 x each                PT Education - 05/30/14 1417    Education provided No   Education Details HEP: heel raises, tandem walking and stance, gastroc stretch at wall   Person(s) Educated Patient   Methods Explanation;Demonstration;Tactile cues;Verbal cues;Handout   Comprehension Verbalized understanding;Returned demonstration           PT Short Term Goals - 05/30/14 1431    PT SHORT TERM GOAL #1   Title be independent in initial HEP   Time 8   Period Weeks   Status Achieved   PT SHORT TERM GOAL #2   Title improve Berg balance score to > or = to 45/56 to reduce falls risk   Time 4   Period Weeks   Status On-going           PT Long Term Goals - 05/24/14 1113    PT LONG TERM GOAL #1   Title be independent in advanced HEP   Time 8   Period Weeks   Status New   PT LONG TERM GOAL #2   Title reduce FOTO to < or = to 35% limitation   Time 8   Period Weeks   Status New   PT LONG TERM GOAL #3   Title improve Berg balance score to > or = to 49/56 to reduce falls risk   Time 8   Period Weeks   Status New   PT LONG TERM GOAL #4   Title return to regular walking for exercise without limitation   Time 8  Period Weeks   Status New   PT LONG TERM GOAL #5   Title report a 50% improved confidence with gait in the community   Time 8   Period Weeks   Status New               Plan - 05/30/14 1435    Clinical Impression Statement Doing well with HEP, added to HEP this week.    Pt will benefit from skilled therapeutic intervention in order to improve on the following deficits Abnormal gait;Difficulty walking;Decreased activity tolerance;Decreased strength   Rehab Potential Good   PT Frequency 2x / week   PT Treatment/Interventions ADLs/Self Care Home Management;Therapeutic activities;Patient/family education;Therapeutic exercise;Passive range of motion;Gait training;Balance training;Manual techniques;Stair training;Neuromuscular re-education   PT Next Visit Plan Balance training, gait on level surface, LE strength   Consulted and Agree with Plan of Care Patient        Problem List Patient Active Problem List   Diagnosis Date Noted  . HERPES ZOSTER 12/13/2009  . POLYCYTHEMIA RUBRA VERA 04/16/2007  . Contracture of palmar fascia 04/16/2007  . HYPOTHYROIDISM 08/29/2006  . HYPERLIPIDEMIA  08/29/2006  . HYPERTENSION 08/29/2006    Benita Boonstra 05/30/2014, 2:40 PM  [facility] [address], [place], [place], [address] Phone: [phone]   Fax:  [phone]

## 2014-05-30 NOTE — Patient Instructions (Signed)
Tandem Stance   Right foot in front of left, heel touching toe both feet "straight ahead". Stand on Foot Triangle of Support with both feet. Balance in this position _15-30__ seconds. Do with left foot in front of right. Do 3x each side, holding onto counter for assistance. Then walk in this position up and down the counter top like you are walking on a balance  Beam. Heel Raise: Bilateral (Standing)   Rise on balls of feet. Repeat _10-15___ times per set. Do __2__ sets per session. Do __1-2__ sessions per day. Holding onto counter as needed. Achilles / Gastroc, Standing   Stand, right foot behind, heel on floor and turned slightly out, leg straight, forward leg bent. Move hips forward. Hold _15__ seconds. Repeat _3__ times per session. Do _1-3__ sessions per day. Make sure you do left side as main stretch.   Copyright  VHI. All rights reserved.    http://orth.exer.us/38   Copyright  VHI. All rights reserved.    Copyright  VHI. All rights reserved.

## 2014-06-02 ENCOUNTER — Ambulatory Visit: Payer: Medicare HMO

## 2014-06-02 DIAGNOSIS — R262 Difficulty in walking, not elsewhere classified: Secondary | ICD-10-CM

## 2014-06-02 DIAGNOSIS — R29898 Other symptoms and signs involving the musculoskeletal system: Secondary | ICD-10-CM

## 2014-06-02 DIAGNOSIS — R269 Unspecified abnormalities of gait and mobility: Secondary | ICD-10-CM

## 2014-06-02 NOTE — Therapy (Deleted)
[facility] [address] [place], [place], [address] Phone: [phone]   Fax:  [phone]  Physical Therapy Treatment  Patient Details  Name: Noah King MRN: [id number] Date of Birth: [birth year] Referring Provider:  Kennyth Arnold, FNP  Encounter Date: 06/02/2014      PT End of Session - 06/02/14 1147    Visit Number 3   Number of Visits 10   Date for PT Re-Evaluation 07/19/14   PT Start Time 1059   PT Stop Time 1144   PT Time Calculation (min) 45 min   Activity Tolerance Patient tolerated treatment well   Behavior During Therapy [facility] for tasks assessed/performed      Past Medical History  Diagnosis Date  . Hiatal hernia   . Polycythemia   . Hypertension     Past Surgical History  Procedure Laterality Date  . Rt knee arthroscopic    . Colonoscopy    . Cataract extraction left eye      There were no vitals filed for this visit.  Visit Diagnosis:  Abnormality of gait  Weakness of left leg  Difficulty walking      Subjective Assessment - 06/02/14 1105    Subjective Pt reports no changes since last time. He has been doing exercises at home; states that calf stretch and tandem walking hvae been difficult.    Patient Stated Goals improve balance and gait to reduce falls.     Currently in Pain? No/denies                         [facility] Adult PT Treatment/Exercise - 06/02/14 0001    High Level Balance   High Level Balance Activities Side stepping  yellow theraband, 3x6 at countertop; soreness after last set   Knee/Hip Exercises: Stretches   Active Hamstring Stretch 3 reps;20 seconds   Soleus Stretch 2 reps;30 seconds   Knee/Hip Exercises: Aerobic   Stationary Bike L1 x 6 min   Knee/Hip Exercises: Standing   Heel Raises 10 reps   Forward Step Up Both;10 reps;Step Height: 6";2 sets;Hand Hold: 2   Rebounder 3 way weight shifting 1 min each   Other Standing Knee Exercises  SLS at trampoline; 2x20 seconds each leg                  PT Short Term Goals - 05/30/14 1431    PT SHORT TERM GOAL #1   Title be independent in initial HEP   Time 8   Period Weeks   Status Achieved   PT SHORT TERM GOAL #2   Title improve Berg balance score to > or = to 45/56 to reduce falls risk   Time 4   Period Weeks   Status On-going           PT Long Term Goals - 05/24/14 1113    PT LONG TERM GOAL #1   Title be independent in advanced HEP   Time 8   Period Weeks   Status New   PT LONG TERM GOAL #2   Title reduce FOTO to < or = to 35% limitation   Time 8   Period Weeks   Status New   PT LONG TERM GOAL #3   Title improve Berg balance score to > or = to 49/56 to reduce falls risk   Time 8   Period Weeks   Status New   PT LONG TERM GOAL #4  Title return to regular walking for exercise without limitation   Time 8   Period Weeks   Status New   PT LONG TERM GOAL #5   Title report a 50% improved confidence with gait in the community   Time 8   Period Weeks   Status New               Plan - 06/02/14 1148    Clinical Impression Statement Pt has been performing new HEP at home; reported some difficulty with tandem stance and calf stretch. Reviewed proper positioning for calf stretch. Increased number of LE strengthening exercises; pt able to complete exercises but requird modification/rest due to hip soreness. Pt would benefit from     Pt will benefit from skilled therapeutic intervention in order to improve on the following deficits Abnormal gait;Difficulty walking;Decreased activity tolerance;Decreased strength   Rehab Potential Good   PT Frequency 2x / week   PT Duration 8 weeks   PT Treatment/Interventions ADLs/Self Care Home Management;Therapeutic activities;Patient/family education;Therapeutic exercise;Passive range of motion;Gait training;Balance training;Manual techniques;Stair training;Neuromuscular re-education   PT Next Visit Plan  Balance training, gait on level surface, LE strength   Consulted and Agree with Plan of Care Patient        Problem List Patient Active Problem List   Diagnosis Date Noted  . HERPES ZOSTER 12/13/2009  . POLYCYTHEMIA RUBRA VERA 04/16/2007  . Contracture of palmar fascia 04/16/2007  . HYPOTHYROIDISM 08/29/2006  . HYPERLIPIDEMIA 08/29/2006  . HYPERTENSION 08/29/2006    Reginal Lutes, SPT 06/02/2014 11:54 AM  [facility] [address], [place], [place], [address] Phone: [phone]   Fax:  [phone]

## 2014-06-02 NOTE — Therapy (Signed)
[facility] [address], [place], [place], [address] Phone: [phone]   Fax:  [phone]  Physical Therapy Treatment  Patient Details  Name: Noah King MRN: [id number] Date of Birth: Sep 21, 1945 Referring Provider:  Kennyth Arnold, FNP  Encounter Date: 06/02/2014      PT End of Session - 06/02/14 1147    Visit Number 3   Number of Visits 10   Date for PT Re-Evaluation 07/19/14   PT Start Time 1059   PT Stop Time 1144   PT Time Calculation (min) 45 min   Activity Tolerance Patient tolerated treatment well   Behavior During Therapy [facility] for tasks assessed/performed      Past Medical History  Diagnosis Date  . Hiatal hernia   . Polycythemia   . Hypertension     Past Surgical History  Procedure Laterality Date  . Rt knee arthroscopic    . Colonoscopy    . Cataract extraction left eye      There were no vitals filed for this visit.  Visit Diagnosis:  Abnormality of gait  Weakness of left leg  Difficulty walking      Subjective Assessment - 06/02/14 1105    Subjective Pt reports no changes since last time. He has been doing exercises at home; states that calf stretch and tandem walking hvae been difficult.    Patient Stated Goals improve balance and gait to reduce falls.     Currently in Pain? No/denies                         [facility] Adult PT Treatment/Exercise - 06/02/14 0001    High Level Balance   High Level Balance Activities Side stepping  yellow theraband, 3x6 at countertop; left hip sore after   Knee/Hip Exercises: Stretches   Active Hamstring Stretch 3 reps;20 seconds   Soleus Stretch 2 reps;30 seconds   Knee/Hip Exercises: Aerobic   Stationary Bike L1 x 6 min   Knee/Hip Exercises: Standing   Heel Raises 10 reps   Forward Step Up Both;10 reps;Step Height: 6";2 sets;Hand Hold: 2   Rebounder 3 way weight shifting 1 min each   Other Standing Knee Exercises SLS  at trampoline; 2x20 seconds each leg                  PT Short Term Goals - 05/30/14 1431    PT SHORT TERM GOAL #1   Title be independent in initial HEP   Time 8   Period Weeks   Status Achieved   PT SHORT TERM GOAL #2   Title improve Berg balance score to > or = to 45/56 to reduce falls risk   Time 4   Period Weeks   Status On-going           PT Long Term Goals - 05/24/14 1113    PT LONG TERM GOAL #1   Title be independent in advanced HEP   Time 8   Period Weeks   Status New   PT LONG TERM GOAL #2   Title reduce FOTO to < or = to 35% limitation   Time 8   Period Weeks   Status New   PT LONG TERM GOAL #3   Title improve Berg balance score to > or = to 49/56 to reduce falls risk   Time 8   Period Weeks   Status New   PT LONG TERM GOAL #4  Title return to regular walking for exercise without limitation   Time 8   Period Weeks   Status New   PT LONG TERM GOAL #5   Title report a 50% improved confidence with gait in the community   Time 8   Period Weeks   Status New               Plan - 06/02/14 1148    Clinical Impression Statement Pt has continued with advanced HEP at home. Reviewed proper positioning for calf stretch for HEP. Increased number of LE strengthening exercises; pt able to complete exercises but requird modification/rest due to hip soreness. Pt would benefit from skilled PT for balance, strength and flexiblity training.     Pt will benefit from skilled therapeutic intervention in order to improve on the following deficits Abnormal gait;Difficulty walking;Decreased activity tolerance;Decreased strength   Rehab Potential Good   PT Frequency 2x / week   PT Duration 8 weeks   PT Treatment/Interventions ADLs/Self Care Home Management;Therapeutic activities;Patient/family education;Therapeutic exercise;Passive range of motion;Gait training;Balance training;Manual techniques;Stair training;Neuromuscular re-education   PT Next Visit Plan  Balance training, gait on level surface, LE strength   Consulted and Agree with Plan of Care Patient        Problem List Patient Active Problem List   Diagnosis Date Noted  . HERPES ZOSTER 12/13/2009  . POLYCYTHEMIA RUBRA VERA 04/16/2007  . Contracture of palmar fascia 04/16/2007  . HYPOTHYROIDISM 08/29/2006  . HYPERLIPIDEMIA 08/29/2006  . HYPERTENSION 08/29/2006    Reginal Lutes, SPT 06/02/2014 11:58 AM  [facility] [address], [place], [place], [address] Phone: [phone]   Fax:  [phone]

## 2014-06-06 ENCOUNTER — Ambulatory Visit: Payer: Medicare HMO | Admitting: Physical Therapy

## 2014-06-06 ENCOUNTER — Encounter: Payer: Self-pay | Admitting: Physical Therapy

## 2014-06-06 DIAGNOSIS — R269 Unspecified abnormalities of gait and mobility: Secondary | ICD-10-CM

## 2014-06-06 DIAGNOSIS — R29898 Other symptoms and signs involving the musculoskeletal system: Secondary | ICD-10-CM

## 2014-06-06 DIAGNOSIS — R262 Difficulty in walking, not elsewhere classified: Secondary | ICD-10-CM

## 2014-06-06 NOTE — Therapy (Signed)
[facility] [address], [place], [place], [address] Phone: [phone]   Fax:  [phone]  Physical Therapy Treatment  Patient Details  Name: Noah King MRN: [id number] Date of Birth: Nov 24, 1945 Referring Provider:  Kennyth Arnold, FNP  Encounter Date: 06/06/2014      PT End of Session - 06/06/14 1425    Visit Number 4   Number of Visits 10   Date for PT Re-Evaluation 07/19/14   PT Start Time 1400   PT Stop Time 1445   PT Time Calculation (min) 45 min   Activity Tolerance Patient tolerated treatment well   Behavior During Therapy [facility] for tasks assessed/performed      Past Medical History  Diagnosis Date  . Hiatal hernia   . Polycythemia   . Hypertension     Past Surgical History  Procedure Laterality Date  . Rt knee arthroscopic    . Colonoscopy    . Cataract extraction left eye      There were no vitals filed for this visit.  Visit Diagnosis:  Abnormality of gait  Weakness of left leg  Difficulty walking      Subjective Assessment - 06/06/14 1405    Subjective Calves are sore from doing a lot of walking this weekend, otherwise really trying to think about my walking.     Currently in Pain? No/denies   Multiple Pain Sites No                         [facility] Adult PT Treatment/Exercise - 06/06/14 0001    High Level Balance   High Level Balance Activities Side stepping  yellow band 4 lengths, then monsterwalk with band 4x    Knee/Hip Exercises: Stretches   Active Hamstring Stretch 3 reps;20 seconds   Knee/Hip Exercises: Aerobic   Stationary Bike L1 x 7  min   Knee/Hip Exercises: Standing   Heel Raises 2 sets;10 reps   Forward Step Up Both;2 sets;20 reps;Hand Hold: 1;Step Height: 6"   Rebounder 3 way weight shifting 1 min each   Other Standing Knee Exercises SLS at trampoline; 2x20 seconds each leg                  PT Short Term Goals - 06/06/14 1407     PT SHORT TERM GOAL #1   Title be independent in initial HEP   Time 8   Period Weeks   Status Achieved   PT SHORT TERM GOAL #2   Title improve Berg balance score to > or = to 45/56 to reduce falls risk   Time 4   Period Weeks   Status On-going  Plan to do next visit.            PT Long Term Goals - 05/24/14 1113    PT LONG TERM GOAL #1   Title be independent in advanced HEP   Time 8   Period Weeks   Status New   PT LONG TERM GOAL #2   Title reduce FOTO to < or = to 35% limitation   Time 8   Period Weeks   Status New   PT LONG TERM GOAL #3   Title improve Berg balance score to > or = to 49/56 to reduce falls risk   Time 8   Period Weeks   Status New   PT LONG TERM GOAL #4   Title return to regular walking for exercise  without limitation   Time 8   Period Weeks   Status New   PT LONG TERM GOAL #5   Title report a 50% improved confidence with gait in the community   Time 8   Period [place] - 06/06/14 1425    Clinical Impression Statement Pt reports his confidence is already better regarding his balance/unsteadiness. He is compliant with HEP and performed a higher volume of work today  in clinic.    Pt will benefit from skilled therapeutic intervention in order to improve on the following deficits Abnormal gait;Difficulty walking;Decreased activity tolerance;Decreased strength   Rehab Potential Good   PT Frequency 2x / week   PT Duration 8 weeks   PT Treatment/Interventions ADLs/Self Care Home Management;Therapeutic activities;Patient/family education;Therapeutic exercise;Passive range of motion;Gait training;Balance training;Manual techniques;Stair training;Neuromuscular re-education   PT Next Visit Plan BERG, continue with balance training   Consulted and Agree with Plan of Care Patient        Problem List Patient Active Problem List   Diagnosis Date Noted  . HERPES ZOSTER 12/13/2009  . POLYCYTHEMIA RUBRA VERA  04/16/2007  . Contracture of palmar fascia 04/16/2007  . HYPOTHYROIDISM 08/29/2006  . HYPERLIPIDEMIA 08/29/2006  . HYPERTENSION 08/29/2006    Judeen Geralds, PTA 06/06/2014, 2:35 PM  [facility] [address] [place], [place], [address] Phone: [phone]   Fax:  [phone]

## 2014-06-09 ENCOUNTER — Encounter: Payer: Medicare HMO | Admitting: Physical Therapy

## 2014-06-16 ENCOUNTER — Ambulatory Visit: Payer: Medicare HMO | Attending: Adult Health | Admitting: Physical Therapy

## 2014-06-16 DIAGNOSIS — R269 Unspecified abnormalities of gait and mobility: Secondary | ICD-10-CM | POA: Diagnosis not present

## 2014-06-16 DIAGNOSIS — R262 Difficulty in walking, not elsewhere classified: Secondary | ICD-10-CM | POA: Diagnosis present

## 2014-06-16 DIAGNOSIS — R29898 Other symptoms and signs involving the musculoskeletal system: Secondary | ICD-10-CM | POA: Insufficient documentation

## 2014-06-16 NOTE — Therapy (Signed)
[facility] [address] [place], [place], [address] Phone: [phone]   Fax:  [phone]  Physical Therapy Treatment  Patient Details  Name: Noah King MRN: [id number] Date of Birth: 04-21-1945 Referring Provider:  Kennyth Arnold, FNP  Encounter Date: 06/16/2014      PT End of Session - 06/16/14 1403    Visit Number 5   Number of Visits 10   Date for PT Re-Evaluation 07/19/14   PT Start Time 5102   PT Stop Time 1443   PT Time Calculation (min) 41 min   Activity Tolerance Patient tolerated treatment well      Past Medical History  Diagnosis Date  . Hiatal hernia   . Polycythemia   . Hypertension     Past Surgical History  Procedure Laterality Date  . Rt knee arthroscopic    . Colonoscopy    . Cataract extraction left eye      There were no vitals filed for this visit.  Visit Diagnosis:  Abnormality of gait  Weakness of left leg  Difficulty walking      Subjective Assessment - 06/16/14 1403    Subjective Walking about one mile per day. Uses a walking stick. Increased confidence in walking by 30-35%.    How long can you walk comfortably? one mile   Currently in Pain? No/denies            [facility] PT Assessment - 06/16/14 0001    Berg Balance Test   Sit to Stand Able to stand without using hands and stabilize independently   Standing Unsupported Able to stand safely 2 minutes   Sitting with Back Unsupported but Feet Supported on Floor or Stool Able to sit safely and securely 2 minutes   Stand to Sit Sits safely with minimal use of hands   Transfers Able to transfer safely, minor use of hands   Standing Unsupported with Eyes Closed Able to stand 3 seconds   Standing Ubsupported with Feet Together Able to place feet together independently and stand 1 minute safely   From Standing, Reach Forward with Outstretched Arm Can reach confidently >25 cm (10")   From Standing Position, Pick up  Object from Floor Able to pick up shoe safely and easily   From Standing Position, Turn to Look Behind Over each Shoulder Looks behind from both sides and weight shifts well   Turn 360 Degrees Able to turn 360 degrees safely in 4 seconds or less   Standing Unsupported, Alternately Place Feet on Step/Stool Able to stand independently and safely and complete 8 steps in 20 seconds   Standing Unsupported, One Foot in Front Able to take small step independently and hold 30 seconds   Standing on One Leg Tries to lift leg/unable to hold 3 seconds but remains standing independently   Total Score 49                     [facility] Adult PT Treatment/Exercise - 06/16/14 0001    High Level Balance   High Level Balance Activities --  eyes closed unsupported 23 seconds longest   High Level Balance Comments semi-tandem stance no UE support longest 15 sec left in back ; 25 Rt in back.   Knee/Hip Exercises: Stretches   Sports administrator 2 reps;60 seconds  with strap   Knee/Hip Exercises: Standing   Functional Squat 2 sets;10 reps   SLS Bil 3 x 30 sec one hand suport  PT Education - 06/16/14 1615    Education provided Yes   Education Details HEP: SLS, eyes closed bil stance, semi tandem stance, quad stretch   Person(s) Educated Patient   Methods Explanation;Demonstration   Comprehension Verbalized understanding;Returned demonstration          PT Short Term Goals - 06/16/14 1619    PT SHORT TERM GOAL #1   Title be independent in initial HEP   Time 8   Period Weeks   Status Achieved   PT SHORT TERM GOAL #2   Title improve Berg balance score to > or = to 45/56 to reduce falls risk  49/56   Time 4   Period Weeks   Status Achieved           PT Long Term Goals - 06/16/14 1620    PT LONG TERM GOAL #3   Title improve Berg balance score to > or = to 52/56 to reduce falls risk  49/56   Time 8   Period Weeks   Status Revised   PT LONG TERM GOAL #4   Title  return to regular walking for exercise without limitation   Time 8   Period Weeks   Status On-going   PT LONG TERM GOAL #5   Title report a 50% improved confidence with gait in the community  30-35%    Time 8   Period Weeks   Status On-going               Plan - 06/16/14 1616    Clinical Impression Statement Patiient is progressing well with balance. His BERG has improved to 49 and he is able to tolerate walking one mile per day. He continues to have significant balance deficits making him a fall risk.    PT Next Visit Plan continue higher level balance; add core exercises        Problem List Patient Active Problem List   Diagnosis Date Noted  . HERPES ZOSTER 12/13/2009  . POLYCYTHEMIA RUBRA VERA 04/16/2007  . Contracture of palmar fascia 04/16/2007  . HYPOTHYROIDISM 08/29/2006  . HYPERLIPIDEMIA 08/29/2006  . HYPERTENSION 08/29/2006    Madelyn Flavors PT  06/16/2014, 4:23 PM  [facility] [address] [place], [place], [address] Phone: [phone]   Fax:  [phone]

## 2014-06-16 NOTE — Patient Instructions (Signed)
Balance: One-Leg   Stand on left leg on Foot Triangle of Support. Use hand supports as needed. Let go with left hand, then right hand. Support as little as possible but as much as needed. Stand _30__ seconds. Repeat, standing on right leg. Repeat multiple times.  Copyright  VHI. All rights reserved.   Semi Tandem Standing   Heel of one foot against arch of other: Just stand as long as you can.   Later:  1.Look right ___ times. 2. Look left ___ times. 3. Look up ___ times. 4. With right arm, reach right, left, forward and back.  Repeat _2-3___ times each side. Do _1-2___ sessions per day.  [email or web address] Stand with your back to a corner. Close eyes with feet hip width apart. Do multiple repetitions. Then move feet closer together to make it harder.  Quadriceps (Prone)   On stomach with sheet around ankles, knees together, hips down, pull heels toward bottom. Keep hips flat. Hold __60__ seconds. Repeat __2__ times. Do ___1-2_ sessions per day. CAUTION: Stretch should be gentle, steady and slow.  Copyright  VHI. All rights reserved.   Madelyn Flavors, PT 06/16/2014 2:42 PM [facility] [address], [place], [place] [address] Phone # [phone] Fax [phone]

## 2014-06-22 ENCOUNTER — Ambulatory Visit: Payer: Medicare HMO | Admitting: Physical Therapy

## 2014-06-30 ENCOUNTER — Ambulatory Visit: Payer: Medicare HMO | Admitting: Physical Therapy

## 2014-06-30 ENCOUNTER — Encounter: Payer: Self-pay | Admitting: Physical Therapy

## 2014-06-30 DIAGNOSIS — R269 Unspecified abnormalities of gait and mobility: Secondary | ICD-10-CM

## 2014-06-30 DIAGNOSIS — R29898 Other symptoms and signs involving the musculoskeletal system: Secondary | ICD-10-CM

## 2014-06-30 DIAGNOSIS — R262 Difficulty in walking, not elsewhere classified: Secondary | ICD-10-CM

## 2014-06-30 NOTE — Therapy (Signed)
[facility] [address] [place], [place], [address] Phone: [phone]   Fax:  [phone]  Physical Therapy Treatment  Patient Details  Name: Noah King MRN: [id number] Date of Birth: [birth year] Referring Provider:  Kennyth Arnold, FNP  Encounter Date: 06/30/2014      PT End of Session - 06/30/14 1502    Visit Number 6   Number of Visits 10   Date for PT Re-Evaluation 07/19/14   PT Start Time 9211   PT Stop Time 1529   PT Time Calculation (min) 44 min   Activity Tolerance Patient tolerated treatment well   Behavior During Therapy [facility] for tasks assessed/performed      Past Medical History  Diagnosis Date  . Hiatal hernia   . Polycythemia   . Hypertension     Past Surgical History  Procedure Laterality Date  . Rt knee arthroscopic    . Colonoscopy    . Cataract extraction left eye      There were no vitals filed for this visit.  Visit Diagnosis:  Abnormality of gait  Weakness of left leg  Difficulty walking      Subjective Assessment - 06/30/14 1454    Subjective Pt reports noticed weakness in bil legs, what also makes walking challenging   Limitations Walking   Currently in Pain? No/denies   Multiple Pain Sites No                         [facility] Adult PT Treatment/Exercise - 06/30/14 0001    High Level Balance   High Level Balance Activities --  Tandem stand on blue pads 2 x 20 sec needs finger support    High Level Balance Comments tandem stance 2x 3 with 20 sec hold with 2 finger support   Knee/Hip Exercises: Aerobic   Stationary Bike L1 x 6  min   Elliptical incline/resistance L 5 x 82min   Knee/Hip Exercises: Standing   Forward Step Up Both;2 sets;Hand Hold: 1;Step Height: 6"  challenged due to baance   SLS Bil 3 x 30 sec one hand suport   Rebounder 3 way weight shifting 1 min each  with 1 Ue support for balance                  PT Short Term  Goals - 06/16/14 1619    PT SHORT TERM GOAL #1   Title be independent in initial HEP   Time 8   Period Weeks   Status Achieved   PT SHORT TERM GOAL #2   Title improve Berg balance score to > or = to 45/56 to reduce falls risk  49/56   Time 4   Period Weeks   Status Achieved           PT Long Term Goals - 06/30/14 1507    PT LONG TERM GOAL #1   Title be independent in advanced HEP   Time 8   Period Weeks   Status On-going   PT LONG TERM GOAL #2   Title reduce FOTO to < or = to 35% limitation   Time 8   Period Weeks   Status On-going   PT LONG TERM GOAL #3   Title improve Berg balance score to > or = to 52/56 to reduce falls risk   Time 8   Period Weeks   Status On-going   PT LONG TERM GOAL #4  Title return to regular walking for exercise without limitation   Time 8   Period Weeks   Status On-going   PT LONG TERM GOAL #5   Title report a 50% improved confidence with gait in the community   Time 8   Period Weeks   Status On-going               Plan - 06/30/14 1503    Clinical Impression Statement Pt still presents with gait abnormality and limited balance.   Pt will benefit from skilled therapeutic intervention in order to improve on the following deficits Abnormal gait;Difficulty walking;Decreased activity tolerance;Decreased strength   Rehab Potential Good   PT Frequency 2x / week   PT Duration 8 weeks   PT Treatment/Interventions ADLs/Self Care Home Management;Therapeutic activities;Patient/family education;Therapeutic exercise;Passive range of motion;Gait training;Balance training;Manual techniques;Stair training;Neuromuscular re-education   PT Next Visit Plan continue higher level balance; add core exercises   Consulted and Agree with Plan of Care Patient        Problem List Patient Active Problem List   Diagnosis Date Noted  . HERPES ZOSTER 12/13/2009  . POLYCYTHEMIA RUBRA VERA 04/16/2007  . Contracture of palmar fascia 04/16/2007  .  HYPOTHYROIDISM 08/29/2006  . HYPERLIPIDEMIA 08/29/2006  . HYPERTENSION 08/29/2006    NAUMANN-HOUEGNIFIO,Jakara Blatter PTA 06/30/2014, 3:23 PM  [facility] [address], [place], [place], [address] Phone: [phone]   Fax:  [phone]

## 2014-07-06 ENCOUNTER — Encounter: Payer: Self-pay | Admitting: Physical Therapy

## 2014-07-06 ENCOUNTER — Ambulatory Visit: Payer: Medicare HMO | Admitting: Physical Therapy

## 2014-07-06 DIAGNOSIS — R269 Unspecified abnormalities of gait and mobility: Secondary | ICD-10-CM

## 2014-07-06 DIAGNOSIS — R29898 Other symptoms and signs involving the musculoskeletal system: Secondary | ICD-10-CM

## 2014-07-06 DIAGNOSIS — R262 Difficulty in walking, not elsewhere classified: Secondary | ICD-10-CM

## 2014-07-06 NOTE — Therapy (Signed)
[facility] [address] [place], [place], [address] Phone: [phone]   Fax:  [phone]  Physical Therapy Treatment  Patient Details  Name: Noah King MRN: [id number] Date of Birth: 08/03/1945 Referring Provider:  Kennyth Arnold, FNP  Encounter Date: 07/06/2014      PT End of Session - 07/06/14 1254    Visit Number 7   Number of Visits 10   Date for PT Re-Evaluation 07/19/14   PT Start Time 1229   PT Stop Time 1307   PT Time Calculation (min) 38 min   Activity Tolerance Patient tolerated treatment well   Behavior During Therapy [facility] for tasks assessed/performed      Past Medical History  Diagnosis Date  . Hiatal hernia   . Polycythemia   . Hypertension     Past Surgical History  Procedure Laterality Date  . Rt knee arthroscopic    . Colonoscopy    . Cataract extraction left eye      There were no vitals filed for this visit.  Visit Diagnosis:  Weakness of left leg  Abnormality of gait  Difficulty walking      Subjective Assessment - 07/06/14 1231    Subjective I joined a gym yesterday and will be doing Silver Sneakers.    Currently in Pain? No/denies   Multiple Pain Sites No                         [facility] Adult PT Treatment/Exercise - 07/06/14 0001    High Level Balance   High Level Balance Comments Tandem stance on blue ovals 2x 30 bil    Knee/Hip Exercises: Aerobic   Stationary Bike L2 x 10 min   Elliptical L6 R5 x 7 min   Knee/Hip Exercises: Standing   Forward Step Up Both;2 sets;Hand Hold: 1;Step Height: 6"  challenged due to baance   SLS On mini tramp with some handasst. 2x 30 sec bil   Rebounder 3 way weight shifting 1 min each  with 1 Ue support for balance                  PT Short Term Goals - 06/16/14 1619    PT SHORT TERM GOAL #1   Title be independent in initial HEP   Time 8   Period Weeks   Status Achieved   PT SHORT TERM GOAL  #2   Title improve Berg balance score to > or = to 45/56 to reduce falls risk  49/56   Time 4   Period Weeks   Status Achieved           PT Long Term Goals - 07/06/14 1233    PT LONG TERM GOAL #1   Title be independent in advanced HEP   Time 8   Period Weeks   Status On-going  Joined gym yesterday   PT LONG TERM GOAL #2   Title reduce FOTO to < or = to 35% limitation   Time 8   Period Weeks   Status On-going  Will do on the 10th visit   PT LONG TERM GOAL #3   Title improve Berg balance score to > or = to 52/56 to reduce falls risk   Time 8   Period Weeks   Status On-going  Will do on the 10th visit   PT LONG TERM GOAL #4   Title return to regular walking for exercise without limitation  Time 8   Period Weeks   Status Achieved               Plan - 07/06/14 1254    Clinical Impression Statement Pt very unsteady while performing his step ups. Pt's upper extremity even shaking during that exercise. PLan to do BERG with FOTO on visit 10. He joined a gym and plans to begin doing the aerobic machines that he does here in PT.   Pt will benefit from skilled therapeutic intervention in order to improve on the following deficits Abnormal gait;Difficulty walking;Decreased activity tolerance;Decreased strength   Rehab Potential Good   PT Frequency 2x / week   PT Duration 8 weeks   PT Treatment/Interventions ADLs/Self Care Home Management;Therapeutic activities;Patient/family education;Therapeutic exercise;Passive range of motion;Gait training;Balance training;Manual techniques;Stair training;Neuromuscular re-education   PT Next Visit Plan Balance exercises and leg strength   Consulted and Agree with Plan of Care Patient        Problem List Patient Active Problem List   Diagnosis Date Noted  . HERPES ZOSTER 12/13/2009  . POLYCYTHEMIA RUBRA VERA 04/16/2007  . Contracture of palmar fascia 04/16/2007  . HYPOTHYROIDISM 08/29/2006  . HYPERLIPIDEMIA 08/29/2006  .  HYPERTENSION 08/29/2006    Kiley Torrence, PTA 07/06/2014, 1:06 PM  [facility] [address], [place], [place], [address] Phone: [phone]   Fax:  [phone]   Pt may need ERO if he is scheduled beyond 7/5 due to little to no availability.

## 2014-07-13 ENCOUNTER — Ambulatory Visit: Payer: Medicare HMO | Admitting: Physical Therapy

## 2014-07-13 ENCOUNTER — Encounter: Payer: Self-pay | Admitting: Physical Therapy

## 2014-07-13 DIAGNOSIS — R269 Unspecified abnormalities of gait and mobility: Secondary | ICD-10-CM

## 2014-07-13 DIAGNOSIS — R29898 Other symptoms and signs involving the musculoskeletal system: Secondary | ICD-10-CM

## 2014-07-13 DIAGNOSIS — R262 Difficulty in walking, not elsewhere classified: Secondary | ICD-10-CM

## 2014-07-13 NOTE — Therapy (Signed)
[facility] [address], [place], [place], [address] Phone: [phone]   Fax:  [phone]  Physical Therapy Treatment  Patient Details  Name: Noah King MRN: [id number] Date of Birth: Feb 28, 1945 Referring Provider:  Kennyth Arnold, FNP  Encounter Date: 07/13/2014      PT End of Session - 07/13/14 0854    Visit Number 8   Number of Visits 10   Date for PT Re-Evaluation 07/19/14   PT Start Time 0844   PT Stop Time 0930   PT Time Calculation (min) 46 min   Activity Tolerance Patient tolerated treatment well   Behavior During Therapy [facility] for tasks assessed/performed      Past Medical History  Diagnosis Date  . Hiatal hernia   . Polycythemia   . Hypertension     Past Surgical History  Procedure Laterality Date  . Rt knee arthroscopic    . Colonoscopy    . Cataract extraction left eye      There were no vitals filed for this visit.  Visit Diagnosis:  Weakness of left leg  Abnormality of gait  Difficulty walking      Subjective Assessment - 07/13/14 0850    Subjective Pt has been twice to the silver sneakers   Limitations Walking   Multiple Pain Sites No                         [facility] Adult PT Treatment/Exercise - 07/13/14 0001    High Level Balance   High Level Balance Activities Side stepping  and monsterwalk with yellow t-band 2 length each   High Level Balance Comments Tandem stance on blue ovals 2x 30 bil   with 1UE fingertip support for balance   Knee/Hip Exercises: Aerobic   Stationary Bike L2 x 10 min   Elliptical L5 R5 x 13min   Knee/Hip Exercises: Standing   Forward Step Up Both;Hand Hold: 1;Step Height: 6";3 sets;10 reps   SLS On mini tramp with some handasst. 2x 30 sec bil                  PT Short Term Goals - 06/16/14 1619    PT SHORT TERM GOAL #1   Title be independent in initial HEP   Time 8   Period Weeks   Status Achieved   PT  SHORT TERM GOAL #2   Title improve Berg balance score to > or = to 45/56 to reduce falls risk  49/56   Time 4   Period Weeks   Status Achieved           PT Long Term Goals - 07/13/14 0901    PT LONG TERM GOAL #1   Title be independent in advanced HEP   Time 8   Period Weeks   Status On-going   PT LONG TERM GOAL #2   Title reduce FOTO to < or = to 35% limitation   Time 8   Period Weeks   Status On-going   PT LONG TERM GOAL #3   Title improve Berg balance score to > or = to 52/56 to reduce falls risk   Time 8   Period Weeks   Status On-going   PT LONG TERM GOAL #4   Title return to regular walking for exercise without limitation   Time 8   Period Weeks   Status Achieved   PT LONG TERM GOAL #5   Title  report a 50% improved confidence with gait in the community  reports 60% improvement, noticed with his walking int the neighbourghood   Time 8   Period Weeks   Status Achieved               Plan - 07/13/14 0459    Clinical Impression Statement Pt unsteady with ambulation and with performing step ups. Perform BERG and Foto at 10th visit. Contin ue to improve LE strength and proprioception   Pt will benefit from skilled therapeutic intervention in order to improve on the following deficits Abnormal gait;Difficulty walking;Decreased activity tolerance;Decreased strength   Rehab Potential Good   PT Frequency 2x / week   PT Duration 8 weeks   PT Treatment/Interventions ADLs/Self Care Home Management;Therapeutic activities;Patient/family education;Therapeutic exercise;Passive range of motion;Gait training;Balance training;Manual techniques;Stair training;Neuromuscular re-education   PT Next Visit Plan continue balance exercises and leg strength   Consulted and Agree with Plan of Care Patient        Problem List Patient Active Problem List   Diagnosis Date Noted  . HERPES ZOSTER 12/13/2009  . POLYCYTHEMIA RUBRA VERA 04/16/2007  . Contracture of palmar fascia  04/16/2007  . HYPOTHYROIDISM 08/29/2006  . HYPERLIPIDEMIA 08/29/2006  . HYPERTENSION 08/29/2006    NAUMANN-HOUEGNIFIO,Aiven Kampe PTA 07/13/2014, 9:28 AM  [facility] [address], [place], [place], [address] Phone: [phone]   Fax:  [phone]

## 2014-07-20 ENCOUNTER — Ambulatory Visit: Payer: Medicare HMO | Attending: Adult Health

## 2014-07-20 DIAGNOSIS — R29898 Other symptoms and signs involving the musculoskeletal system: Secondary | ICD-10-CM | POA: Diagnosis not present

## 2014-07-20 DIAGNOSIS — R269 Unspecified abnormalities of gait and mobility: Secondary | ICD-10-CM | POA: Diagnosis present

## 2014-07-20 NOTE — Therapy (Signed)
[facility] [address], [place], [place], [address] Phone: [phone]   Fax:  [phone]  Physical Therapy Treatment  Patient Details  Name: Noah King MRN: [id number] Date of Birth: January 08, 1946 Referring Provider:  [name], NP  Encounter Date: 07/20/2014      PT End of Session - 07/20/14 1523    Visit Number 9   PT Start Time 6962   PT Stop Time 1528   PT Time Calculation (min) 42 min   Activity Tolerance Patient tolerated treatment well   Behavior During Therapy [facility] for tasks assessed/performed      Past Medical History  Diagnosis Date  . Hiatal hernia   . Polycythemia   . Hypertension     Past Surgical History  Procedure Laterality Date  . Rt knee arthroscopic    . Colonoscopy    . Cataract extraction left eye      There were no vitals filed for this visit.  Visit Diagnosis:  Weakness of left leg  Abnormality of gait      Subjective Assessment - 07/20/14 1452    Subjective Ready for D/C.  Pt has good HEP in place and is going to [employer] now.     Currently in Pain? No/denies            [facility] PT Assessment - 07/20/14 0001    Assessment   Medical Diagnosis Abnormality of gait  (R26.9)   Onset Date/Surgical Date 05/23/12   Prior Function   Level of Independence Independent   Observation/Other Assessments   Focus on Therapeutic Outcomes (FOTO)  40% limitation   Berg Balance Test   Sit to Stand Able to stand without using hands and stabilize independently   Standing Unsupported Able to stand safely 2 minutes   Sitting with Back Unsupported but Feet Supported on Floor or Stool Able to sit safely and securely 2 minutes   Stand to Sit Sits safely with minimal use of hands   Transfers Able to transfer safely, minor use of hands   Standing Unsupported with Eyes Closed Able to stand 10 seconds with supervision   Standing Ubsupported with Feet Together Able to place feet  together independently and stand 1 minute safely   From Standing, Reach Forward with Outstretched Arm Can reach forward >12 cm safely (5")   From Standing Position, Pick up Object from Floor Able to pick up shoe safely and easily   From Standing Position, Turn to Look Behind Over each Shoulder Looks behind from both sides and weight shifts well   Turn 360 Degrees Able to turn 360 degrees safely but slowly   Standing Unsupported, Alternately Place Feet on Step/Stool Able to stand independently and safely and complete 8 steps in 20 seconds   Standing Unsupported, One Foot in Front Able to take small step independently and hold 30 seconds   Standing on One Leg Able to lift leg independently and hold equal to or more than 3 seconds   Total Score 48                     [facility] Adult PT Treatment/Exercise - 07/20/14 0001    Standardized Balance Assessment   Standardized Balance Assessment Berg Balance Test   Knee/Hip Exercises: Aerobic   Stationary Bike L2 x 10 min   Elliptical L5 R5 x 98mn   Knee/Hip Exercises: Machines for Strengthening   Total Gym Leg Press Seat 7, 70# bil 2x15,  Knee/Hip Exercises: Standing   Rebounder 3 way weight shifting 1 min each  with 1 UE support as needed for balance                  PT Short Term Goals - 06/16/14 1619    PT SHORT TERM GOAL #1   Title be independent in initial HEP   Time 8   Period Weeks   Status Achieved   PT SHORT TERM GOAL #2   Title improve Berg balance score to > or = to 45/56 to reduce falls risk  49/56   Time 4   Period Weeks   Status Achieved           PT Long Term Goals - 07/26/14 1453    PT LONG TERM GOAL #1   Title be independent in advanced HEP   Status Achieved  independent in gym and home exercises   PT LONG TERM GOAL #2   Title reduce FOTO to < or = to 35% limitation   Status Partially Met  40% limitation   PT LONG TERM GOAL #3   Title improve Berg balance score to > or = to 52/56 to  reduce falls risk   Status Partially Met  48/56 today   PT LONG TERM GOAL #4   Title return to regular walking for exercise without limitation   Status Achieved   PT LONG TERM GOAL #5   Title report a 50% improved confidence with gait in the community   Status Achieved  70% overall improvement reported               Plan - 2014/07/26 1514    Clinical Impression Statement Pt with 70% improved confidence with gait.  Pt with 48/56 on Berg Balance test today indicating moderate falls risk.  Pt with continued instability but this is much improved from evaluation.  Pt ambulates with normal base of support.  Pt will continue with HEP and gym exercises for continued gains.   Pt will benefit from skilled therapeutic intervention in order to improve on the following deficits Abnormal gait;Difficulty walking;Decreased activity tolerance;Decreased strength   PT Next Visit Plan D/C PT   Consulted and Agree with Plan of Care Patient          G-Codes - 2014/07/26 1451    Functional Assessment Tool Used FOTO: 40% limitation   Functional Limitation Mobility: Walking and moving around   Mobility: Walking and Moving Around Goal Status [phone]) At least 20 percent but less than 40 percent impaired, limited or restricted   Mobility: Walking and Moving Around Discharge Status [phone]) At least 20 percent but less than 40 percent impaired, limited or restricted      Problem List Patient Active Problem List   Diagnosis Date Noted  . HERPES ZOSTER 12/13/2009  . POLYCYTHEMIA RUBRA VERA 04/16/2007  . Contracture of palmar fascia 04/16/2007  . HYPOTHYROIDISM 08/29/2006  . HYPERLIPIDEMIA 08/29/2006  . HYPERTENSION 08/29/2006   PHYSICAL THERAPY DISCHARGE SUMMARY  Visits from Start of Care: 9  Current functional level related to goals / functional outcomes: See above for current goal status.     Remaining deficits: See above clinical impression statement. Pt with HEP in place to address remaining  balance deficits.     Education / Equipment: HEP, gym exercises, fall prevention  Plan: Patient agrees to discharge.  Patient goals were partially met. Patient is being discharged due to being pleased with the current functional level.  ?????  TAKACS,KELLY, PT 07/20/2014, 3:31 PM  [facility] [address] [place], [place], [address] Phone: [phone]   Fax:  [phone]

## 2014-08-01 ENCOUNTER — Encounter: Payer: Self-pay | Admitting: Adult Health

## 2014-08-01 ENCOUNTER — Ambulatory Visit (INDEPENDENT_AMBULATORY_CARE_PROVIDER_SITE_OTHER): Payer: Medicare HMO | Admitting: Adult Health

## 2014-08-01 VITALS — BP 132/68 | Temp 98.1°F | Ht 71.0 in | Wt 188.4 lb

## 2014-08-01 DIAGNOSIS — Z7189 Other specified counseling: Secondary | ICD-10-CM

## 2014-08-01 DIAGNOSIS — Z7689 Persons encountering health services in other specified circumstances: Secondary | ICD-10-CM

## 2014-08-01 DIAGNOSIS — I10 Essential (primary) hypertension: Secondary | ICD-10-CM

## 2014-08-01 NOTE — Progress Notes (Signed)
Pre visit review using our clinic review tool, if applicable. No additional management support is needed unless otherwise documented below in the visit note. 

## 2014-08-01 NOTE — Patient Instructions (Signed)
It was great seeing you again!   Try and get to the gym to start doing some elliptical work and continue to eat healthy.   Follow up with me in October for your next physical.   If you need anything in the meantime, please let me know.

## 2014-08-01 NOTE — Progress Notes (Deleted)
   Subjective:    Patient ID: Noah King, male    DOB: 03-May-1945, 69 y.o.   MRN: [id number]  HPI    Review of Systems     Objective:   Physical Exam        Assessment & Plan:

## 2014-08-01 NOTE — Progress Notes (Signed)
HPI:  Noah King is here to establish care. He is a very pleaseant former smoker with  has a past medical history of Hiatal hernia; Polycythemia; Hypertension; and Dupuytren's contracture.  Last PCP and physical:10/2013 with NP Justin Mend  Has the following chronic problems that require follow up and concerns today:  Unsteady Gait - He was seen by PT and feels as though they were very helpful. He no longer feels unsteady and is starting to exercise more. He had an MRI done and was seen by Neurology for this, which had a negative exam.   HTN - Has kept a log of blood pressures over the last month. His blood pressures range from 161 - 096 systolic and 04-54 diastolic in the morning. Denies any dizziness, lightheadedness, headaches or blurred vision.   ROS negative for unless reported above: fevers, chills,feeling poorly, unintentional weight loss, hearing or vision loss, chest pain, palpitations, leg claudication, struggling to breath,Not feeling congested in the chest, no orthopenia, no cough,no wheezing, normal appetite, no soft tissue swelling, no hemoptysis, melena, hematochezia, hematuria, falls, loc, si, or thoughts of self harm.  Immunizations:UTD Diet: Eats fairly healthy Exercise: He walks each day.  Colonoscopy: Will have one this year- every 5 due to polyps   Past Medical History  Diagnosis Date  . Hiatal hernia   . Polycythemia   . Hypertension   . Dupuytren's contracture     Past Surgical History  Procedure Laterality Date  . Rt knee arthroscopic    . Colonoscopy    . Cataract extraction left eye      Family History  Problem Relation Age of Onset  . Colon cancer Father   . Stroke Mother   . Cancer Sister     breast     History   Social History  . Marital Status: Single    Spouse Name: N/A  . Number of Children: N/A  . Years of Education: N/A   Social History Main Topics  . Smoking status: Former Smoker    Quit date: 01/14/1985  . Smokeless  tobacco: Not on file  . Alcohol Use: Yes     Comment: beer daily   . Drug Use: No  . Sexual Activity:    Partners: Female   Other Topics Concern  . None   Social History Narrative   Retired- He worked at [employer]   Divorced   One daughter who lives in [place] [address]     Current outpatient prescriptions:  .  aspirin 81 MG tablet, Take 81 mg by mouth daily.  , Disp: , Rfl:  .  benazepril (LOTENSIN) 10 MG tablet, Take 1 tablet (10 mg total) by mouth daily., Disp: 90 tablet, Rfl: 3 .  Multiple Vitamin (MULTIVITAMIN) tablet, Take 1 tablet by mouth daily., Disp: , Rfl:  .  sildenafil (REVATIO) 20 MG tablet, Take 1 tablet (20 mg total) by mouth daily as needed., Disp: 30 tablet, Rfl: 4 .  verapamil (CALAN-SR) 180 MG CR tablet, Take 1 tablet (180 mg total) by mouth at bedtime., Disp: 90 tablet, Rfl: 3  EXAM:  Filed Vitals:   08/01/14 1356  BP: 132/68  Temp: 98.1 F (36.7 C)    Body mass index is 26.29 kg/(m^2).  GENERAL: vitals reviewed and listed above, alert, oriented, appears well hydrated and in no acute distress  HEENT: atraumatic, conjunttiva clear, no obvious abnormalities on inspection of external nose and ears. TM's visualized. No cerumen impaction  NECK: Neck is soft and supple without masses,  no adenopathy or thyromegaly, trachea midline, no JVD. Normal range of motion.   LUNGS: clear to auscultation bilaterally, no wheezes, rales or rhonchi, good air movement  CV: Regular rate and rhythm, normal S1/S2, no audible murmurs, gallops, or rubs. No carotid bruit and no peripheral edema.   MS: moves all extremities without noticeable abnormality. No edema noted.  Abd: soft/nontender/nondistended/normal bowel sounds   Skin: warm and dry, no rash   Extremities: No clubbing, cyanosis, or edema. Capillary refill is WNL. Pulses intact bilaterally in upper and lower extremities.   Neuro: CN II-XII intact, sensation and reflexes normal throughout, 5/5 muscle strength in bilateral  upper and lower extremities. Normal finger to nose. Normal rapid alternating movements.   PSYCH: pleasant and cooperative, no obvious depression or anxiety  ASSESSMENT AND PLAN:  1. Encounter to establish care - Follow up in October for CPE - Follow up sooner if needed - Continue to eat healthy - Start exercising, this will help with gait and losing weight which will help with keeping your blood pressure low.   2. Essential hypertension - Controlled on current medication regimen- no change - Continue to monitor blood pressure at home.     Discussed the following assessment and plan:  No diagnosis found. -We reviewed the PMH, PSH, FH, SH, Meds and Allergies. -We provided refills for any medications we will prescribe as needed. -We addressed current concerns per orders and patient instructions. -We have asked for records for pertinent exams, studies, vaccines and notes from previous providers. -We have advised patient to follow up per instructions below.   -Patient advised to return or notify a provider immediately if symptoms worsen or persist or new concerns arise.  There are no Patient Instructions on file for this visit.   [employer]

## 2014-08-19 ENCOUNTER — Encounter: Payer: Self-pay | Admitting: Gastroenterology

## 2014-11-09 ENCOUNTER — Ambulatory Visit (INDEPENDENT_AMBULATORY_CARE_PROVIDER_SITE_OTHER): Payer: Medicare HMO | Admitting: Adult Health

## 2014-11-09 ENCOUNTER — Encounter: Payer: Self-pay | Admitting: Adult Health

## 2014-11-09 VITALS — BP 128/70 | Temp 97.7°F | Ht 70.0 in | Wt 183.5 lb

## 2014-11-09 DIAGNOSIS — Z23 Encounter for immunization: Secondary | ICD-10-CM

## 2014-11-09 DIAGNOSIS — I10 Essential (primary) hypertension: Secondary | ICD-10-CM

## 2014-11-09 DIAGNOSIS — Z125 Encounter for screening for malignant neoplasm of prostate: Secondary | ICD-10-CM | POA: Diagnosis not present

## 2014-11-09 DIAGNOSIS — Z0000 Encounter for general adult medical examination without abnormal findings: Secondary | ICD-10-CM

## 2014-11-09 LAB — POCT URINALYSIS DIPSTICK
BILIRUBIN UA: NEGATIVE
Blood, UA: NEGATIVE
Glucose, UA: NEGATIVE
KETONES UA: NEGATIVE
LEUKOCYTES UA: NEGATIVE
Nitrite, UA: NEGATIVE
PH UA: 5.5
SPEC GRAV UA: 1.025
Urobilinogen, UA: 0.2

## 2014-11-09 LAB — BASIC METABOLIC PANEL
BUN: 12 mg/dL (ref 6–23)
CO2: 29 mEq/L (ref 19–32)
Calcium: 9.8 mg/dL (ref 8.4–10.5)
Chloride: 104 mEq/L (ref 96–112)
Creatinine, Ser: 0.84 mg/dL (ref 0.40–1.50)
GFR: 96 mL/min (ref >60.00)
Glucose, Bld: 97 mg/dL (ref 70–99)
POTASSIUM: 5.1 meq/L (ref 3.5–5.1)
Sodium: 141 mEq/L (ref 135–145)

## 2014-11-09 LAB — CBC WITH DIFFERENTIAL/PLATELET
Basophils Absolute: 0 10*3/uL (ref 0.0–0.1)
Basophils Relative: 0.7 % (ref 0.0–3.0)
EOS ABS: 0.4 10*3/uL (ref 0.0–0.7)
Eosinophils Relative: 7.2 % — ABNORMAL HIGH (ref 0.0–5.0)
HEMATOCRIT: 42.2 % (ref 39.0–52.0)
HEMOGLOBIN: 13.6 g/dL (ref 13.0–17.0)
LYMPHS PCT: 17.5 % (ref 12.0–46.0)
Lymphs Abs: 0.9 10*3/uL (ref 0.7–4.0)
MCHC: 32.3 g/dL (ref 30.0–36.0)
MCV: 78.8 fl (ref 78.0–100.0)
Monocytes Absolute: 0.7 10*3/uL (ref 0.1–1.0)
Monocytes Relative: 13.2 % — ABNORMAL HIGH (ref 3.0–12.0)
Neutro Abs: 3.2 10*3/uL (ref 1.4–7.7)
Neutrophils Relative %: 61.4 % (ref 43.0–77.0)
Platelets: 176 10*3/uL (ref 150.0–400.0)
RBC: 5.35 Mil/uL (ref 4.22–5.81)
RDW: 18.8 % — ABNORMAL HIGH (ref 11.5–15.5)
WBC: 5.3 10*3/uL (ref 4.0–10.5)

## 2014-11-09 LAB — TSH: TSH: 0.82 u[IU]/mL (ref 0.35–4.50)

## 2014-11-09 LAB — HEPATIC FUNCTION PANEL
ALK PHOS: 45 U/L (ref 39–117)
ALT: 38 U/L (ref 0–53)
AST: 31 U/L (ref 0–37)
Albumin: 4.2 g/dL (ref 3.5–5.2)
BILIRUBIN DIRECT: 0.2 mg/dL (ref 0.0–0.3)
BILIRUBIN TOTAL: 0.8 mg/dL (ref 0.2–1.2)
Total Protein: 7.1 g/dL (ref 6.0–8.3)

## 2014-11-09 LAB — LIPID PANEL
Cholesterol: 176 mg/dL (ref 0–200)
HDL: 57.1 mg/dL (ref >39.00)
LDL CALC: 105 mg/dL — AB (ref 0–99)
NONHDL: 118.57
Total CHOL/HDL Ratio: 3
Triglycerides: 69 mg/dL (ref 0.0–149.0)
VLDL: 13.8 mg/dL (ref 0.0–40.0)

## 2014-11-09 LAB — PSA: PSA: 1.49 ng/mL (ref 0.10–4.00)

## 2014-11-09 NOTE — Progress Notes (Signed)
Subjective:  Patient presents for yearly preventative medicine examination. Medicare questionnaire was completed  All immunizations and health maintenance protocols were reviewed with the patient and needed orders were placed.  Appropriate screening laboratory values were ordered for the patient including screening of hyperlipidemia, renal function and hepatic function. If indicated by BPH, a PSA was ordered.  Medication reconciliation,  past medical history, social history, problem list and allergies were reviewed in detail with the patient  Goals were established with regard to weight loss, exercise, and  diet in compliance with medications  End of life planning was discussed and he was given information on living wills and health care power of attorney.   He denies any interval history.     Preventive Screening-Counseling & Management  Smoking Status: Former Smoker [employer] Smoking status: No smokers in home  Risk Factors Regular exercise: Goes to the gym 4 days a week and works on leg exercises.  Diet: Eats heathy Fall Risk: None   Cardiac risk factors:  advanced age (older than 32 for men, 82 for women)  UT:[id number] No diabetes.  Family History: Stroke- Mother  Depression Screen None. PHQ2 0   Activities of Daily Living Independent ADLs and IADLs   Hearing Difficulties: patient declines  Cognitive Testing No reported trouble.   Normal 3 word recall  List the Names of Other Physician/Practitioners you currently use: 1. Optho Dr. Idolina Primer  Immunization History  Administered Date(s) Administered  . Influenza Split 11/27/2010, 12/19/2011  . Influenza Whole 10/20/2008, 11/23/2009  . Influenza, High Dose Seasonal PF 11/09/2014  . Influenza-Unspecified 10/30/2012, 10/26/2013  . Pneumococcal Conjugate-13 05/04/2014  . Pneumococcal Polysaccharide-23 03/05/2013  . Td 07/14/2000  . Tdap 08/26/2011   Required Immunizations needed today  :Flu  Screening tests- up to date Health Maintenance Due  Topic Date Due  . ZOSTAVAX  10/20/2004  . INFLUENZA VACCINE  08/15/2014    ROS- No pertinent positives discovered in course of AWV  The following were reviewed and entered/updated in epic: Past Medical History  Diagnosis Date  . Hiatal hernia   . Polycythemia   . Hypertension   . Dupuytren's contracture    Patient Active Problem List   Diagnosis Date Noted  . HERPES ZOSTER 12/13/2009  . POLYCYTHEMIA RUBRA VERA 04/16/2007  . Contracture of palmar fascia 04/16/2007  . HYPOTHYROIDISM 08/29/2006  . HYPERLIPIDEMIA 08/29/2006  . Essential hypertension 08/29/2006   Past Surgical History  Procedure Laterality Date  . Rt knee arthroscopic    . Colonoscopy    . Cataract extraction left eye      Family History  Problem Relation Age of Onset  . Colon cancer Father   . Stroke Mother   . Cancer Sister     breast     Medications- reviewed and updated Current Outpatient Prescriptions  Medication Sig Dispense Refill  . aspirin 81 MG tablet Take 81 mg by mouth daily.      . benazepril (LOTENSIN) 10 MG tablet Take 1 tablet (10 mg total) by mouth daily. 90 tablet 3  . Multiple Vitamin (MULTIVITAMIN) tablet Take 1 tablet by mouth daily.    . sildenafil (REVATIO) 20 MG tablet Take 1 tablet (20 mg total) by mouth daily as needed. 30 tablet 4  . verapamil (CALAN-SR) 180 MG CR tablet Take 1 tablet (180 mg total) by mouth at bedtime. 90 tablet 3   No current facility-administered medications for this visit.    Allergies-reviewed and updated No Known Allergies  Social History  Social History  . Marital Status: Single    Spouse Name: N/A  . Number of Children: N/A  . Years of Education: N/A   Social History Main Topics  . Smoking status: Former Smoker    Quit date: 01/14/1985  . Smokeless tobacco: None  . Alcohol Use: Yes     Comment: beer daily   . Drug Use: No  . Sexual Activity:    Partners: Female   Other  Topics Concern  . None   Social History Narrative   Retired- He worked at [employer]   Divorced   One daughter who lives in [place]    Objective: BP 128/70 mmHg  Temp(Src) 97.7 F (36.5 C) (Oral)  Ht 5\' 10"  (1.778 m)  Wt 183 lb 8 oz (83.235 kg)  BMI 26.33 kg/m2   GENERAL: vitals reviewed and listed above, alert, oriented, appears well hydrated and in no acute distress  HEENT: atraumatic, conjunttiva clear, no obvious abnormalities on inspection of external nose and ears. TM's visualized. No cerumen impaction  NECK: Neck is soft and supple without masses, no adenopathy or thyromegaly, trachea midline, no JVD. Normal range of motion.   LUNGS: clear to auscultation bilaterally, no wheezes, rales or rhonchi, good air movement  CV: Regular rate and rhythm, normal S1/S2, no audible murmurs, gallops, or rubs. No carotid bruit and no peripheral edema.   MS: moves all extremities without noticeable abnormality. No edema noted.  Abd: soft/nontender/nondistended/normal bowel sounds   Skin: warm and dry, no rash. Scattered cherry angiomas  Extremities: No clubbing, cyanosis, or edema. Capillary refill is WNL. Pulses intact bilaterally in upper and lower extremities.   Neuro: CN II-XII intact, sensation and reflexes normal throughout, 5/5 muscle strength in bilateral upper and lower extremities. Normal finger to nose. Normal rapid alternating movements.   PSYCH: pleasant and cooperative, no obvious depression or anxiety   Assessment/Plan:  1. Routine general medical examination at a health care facility - Benign exam  - Basic metabolic panel - CBC with Differential/Platelet - Hepatic function panel - Lipid panel - POCT urinalysis dipstick - TSH - PSA - Follow up in one year for next MWE - Follow up sooner if needed - Continue to work on lower body exercises to help with strength  2. Essential hypertension - Basic metabolic panel - CBC with Differential/Platelet - EKG 12-Lead-  Sinus Bradycardia, Rate 56 - Hepatic function panel - Lipid panel - POCT urinalysis dipstick - No change in medications  - High dose flu given    Return precautions advised.    These are the goals we discussed: Goals    . Decrease the likelihood of falling    . Exercise 150 minutes per week (moderate activity)       This is a list of the screening recommended for you and due dates:  Health Maintenance  Topic Date Due  . Shingles Vaccine  10/20/2004  . Flu Shot  08/15/2014  . Colon Cancer Screening  10/13/2019  . Tetanus Vaccine  08/25/2021  .  Hepatitis C: One time screening is recommended by Center for Disease Control  (CDC) for  adults born from [birth year] through 1966.   Completed  . Pneumonia vaccines  Completed

## 2014-11-09 NOTE — Addendum Note (Signed)
Addended by: Apolinar Junes on: 11/09/2014 11:06 AM   Modules accepted: Level of Service

## 2014-11-09 NOTE — Patient Instructions (Addendum)
  Noah King , Thank you for taking time to come for your Medicare Wellness Visit. I appreciate your ongoing commitment to your health goals. Please review the following plan we discussed and let me know if I can assist you in the future.   These are the goals we discussed: Goals    . Decrease the likelihood of falling    . Exercise 150 minutes per week (moderate activity)       This is a list of the screening recommended for you and due dates:  Health Maintenance  Topic Date Due  . Shingles Vaccine  10/20/2004  . Flu Shot  08/15/2014  . Colon Cancer Screening  10/13/2019  . Tetanus Vaccine  08/25/2021  .  Hepatitis C: One time screening is recommended by Center for Disease Control  (CDC) for  adults born from [birth year] through 1966.   Completed  . Pneumonia vaccines  Completed

## 2014-11-09 NOTE — Progress Notes (Signed)
Pre visit review using our clinic review tool, if applicable. No additional management support is needed unless otherwise documented below in the visit note. 

## 2014-11-10 LAB — HEPATITIS C ANTIBODY: HCV AB: NEGATIVE

## 2014-11-22 ENCOUNTER — Encounter: Payer: Self-pay | Admitting: Gastroenterology

## 2015-01-05 ENCOUNTER — Ambulatory Visit (AMBULATORY_SURGERY_CENTER): Payer: Self-pay | Admitting: *Deleted

## 2015-01-05 VITALS — Ht 71.0 in | Wt 183.0 lb

## 2015-01-05 DIAGNOSIS — Z860100 Personal history of colon polyps, unspecified: Secondary | ICD-10-CM

## 2015-01-05 MED ORDER — NA SULFATE-K SULFATE-MG SULF 17.5-3.13-1.6 GM/177ML PO SOLN: 1.0000 | Freq: Once | ORAL | Status: DC

## 2015-01-05 NOTE — Progress Notes (Signed)
No egg or soy allergy. No anesthesia problems.  No home O2.  No diet meds.  No emmi, pt refused.

## 2015-01-19 ENCOUNTER — Encounter: Payer: Self-pay | Admitting: Gastroenterology

## 2015-01-19 ENCOUNTER — Ambulatory Visit (AMBULATORY_SURGERY_CENTER): Payer: Medicare HMO | Admitting: Gastroenterology

## 2015-01-19 VITALS — BP 132/71 | HR 63 | Temp 97.2°F | Resp 13 | Ht 71.0 in | Wt 183.0 lb

## 2015-01-19 DIAGNOSIS — Z800 Family history of malignant neoplasm of digestive organs: Secondary | ICD-10-CM | POA: Diagnosis not present

## 2015-01-19 DIAGNOSIS — D122 Benign neoplasm of ascending colon: Secondary | ICD-10-CM

## 2015-01-19 DIAGNOSIS — Z1211 Encounter for screening for malignant neoplasm of colon: Secondary | ICD-10-CM | POA: Diagnosis not present

## 2015-01-19 MED ORDER — SODIUM CHLORIDE 0.9 % IV SOLN: 500.0000 mL | INTRAVENOUS | Status: DC

## 2015-01-19 NOTE — Op Note (Signed)
[facility]  [address] [place], [address]   COLONOSCOPY PROCEDURE REPORT  PATIENT: Noah King, Noah King  MR#: [id number] BIRTHDATE: 1945/02/12 , 69  yrs. old GENDER: male ENDOSCOPIST: Ladene Artist, MD, [facility] PROCEDURE DATE:  01/19/2015 PROCEDURE:   Colonoscopy, screening and Colonoscopy with snare polypectomy First Screening Colonoscopy - Avg.  risk and is 50 yrs.  old or older - No.  Prior Negative Screening - Now for repeat screening. Less than 10 yrs Prior Negative Screening - Now for repeat screening.  Above average risk  History of Adenoma - Now for follow-up colonoscopy & has been > or = to 3 yrs.  N/A  Polyps removed today? Yes ASA CLASS:   Class II INDICATIONS:Screening for colonic neoplasia and FH Colon or Rectal Adenocarcinoma. MEDICATIONS: Monitored anesthesia care and Propofol 250 mg IV DESCRIPTION OF PROCEDURE:   After the risks benefits and alternatives of the procedure were thoroughly explained, informed consent was obtained.  The digital rectal exam revealed no abnormalities of the rectum.   The LB [id number]  endoscope was introduced through the anus and advanced to the cecum, which was identified by both the appendix and ileocecal valve. No adverse events experienced.   The quality of the prep was excellent. (Suprep was used)  The instrument was then slowly withdrawn as the colon was fully examined. Estimated blood loss is zero unless otherwise noted in this procedure report.    COLON FINDINGS: Three sessile polyps measuring 6-7 mm in size were found in the ascending colon.  Polypectomies were performed with a cold snare.  The resection was complete, the polyp tissue was completely retrieved and sent to histology.   There was mild diverticulosis noted in the sigmoid colon and transverse colon. The examination was otherwise normal.  Retroflexed views revealed internal Grade I hemorrhoids. The time to cecum = 3.2 Withdrawal time =  10.4  The scope was withdrawn and the procedure completed. COMPLICATIONS: There were no immediate complications.  ENDOSCOPIC IMPRESSION: 1.   Three sessile polyps in the ascending colon; polypectomies performed with a cold snare 2.   Mild diverticulosis in the sigmoid colon and transverse colon 3.   Grade l internal  RECOMMENDATIONS: 1.  Await pathology results 2.  High fiber diet with liberal fluid intake. 3.  Repeat Colonoscopy in 5 years.  eSigned:  Ladene Artist, MD, [facility] 01/19/2015 10:23 AM  [C

## 2015-01-19 NOTE — Patient Instructions (Signed)
HANDOUTS GIVEN INCLUDE;POLYPS, DIVERTICULOSIS, HIGH FIBER AND HEMORRHOIDS.   YOU HAD AN ENDOSCOPIC PROCEDURE TODAY AT [facility]:   Refer to the procedure report that was given to you for any specific questions about what was found during the examination.  If the procedure report does not answer your questions, please call your gastroenterologist to clarify.  If you requested that your care partner not be given the details of your procedure findings, then the procedure report has been included in a sealed envelope for you to review at your convenience later.  YOU SHOULD EXPECT: Some feelings of bloating in the abdomen. Passage of more gas than usual.  Walking can help get rid of the air that was put into your GI tract during the procedure and reduce the bloating. If you had a lower endoscopy (such as a colonoscopy or flexible sigmoidoscopy) you may notice spotting of blood in your stool or on the toilet paper. If you underwent a bowel prep for your procedure, you may not have a normal bowel movement for a few days.  Please Note:  You might notice some irritation and congestion in your nose or some drainage.  This is from the oxygen used during your procedure.  There is no need for concern and it should clear up in a day or so.  SYMPTOMS TO REPORT IMMEDIATELY:   Following lower endoscopy (colonoscopy or flexible sigmoidoscopy):  Excessive amounts of blood in the stool  Significant tenderness or worsening of abdominal pains  Swelling of the abdomen that is new, acute  Fever of 100F or higher   For urgent or emergent issues, a gastroenterologist can be reached at any hour by calling [phone].   DIET: Your first meal following the procedure should be a small meal and then it is ok to progress to your normal diet. Heavy or fried foods are harder to digest and may make you feel nauseous or bloated.  Likewise, meals heavy in dairy and vegetables can increase bloating.  Drink  plenty of fluids but you should avoid alcoholic beverages for 24 hours.  ACTIVITY:  You should plan to take it easy for the rest of today and you should NOT DRIVE or use heavy machinery until tomorrow (because of the sedation medicines used during the test).    FOLLOW UP: Our staff will call the number listed on your records the next business day following your procedure to check on you and address any questions or concerns that you may have regarding the information given to you following your procedure. If we do not reach you, we will leave a message.  However, if you are feeling well and you are not experiencing any problems, there is no need to return our call.  We will assume that you have returned to your regular daily activities without incident.  If any biopsies were taken you will be contacted by phone or by letter within the next 1-3 weeks.  Please call us at [phone] if you have not heard about the biopsies in 3 weeks.    SIGNATURES/CONFIDENTIALITY: You and/or your care partner have signed paperwork which will be entered into your electronic medical record.  These signatures attest to the fact that that the information above on your After Visit Summary has been reviewed and is understood.  Full responsibility of the confidentiality of this discharge information lies with you and/or your care-partner.

## 2015-01-19 NOTE — Progress Notes (Signed)
A/ox3 pleased with MAC, report to Karen RN 

## 2015-01-19 NOTE — Progress Notes (Signed)
Called to room to assist during endoscopic procedure.  Patient ID and intended procedure confirmed with present staff. Received instructions for my participation in the procedure from the performing physician.  

## 2015-01-20 ENCOUNTER — Telehealth: Payer: Self-pay

## 2015-01-20 NOTE — Telephone Encounter (Signed)
  Follow up Call-  Call back number 01/19/2015  Post procedure Call Back phone  # [phone]  Permission to leave phone message Yes     Patient questions:  Do you have a fever, pain , or abdominal swelling? No. Pain Score  0 *  Have you tolerated food without any problems? Yes.    Have you been able to return to your normal activities? Yes.    Do you have any questions about your discharge instructions: Diet   No. Medications  No. Follow up visit  No.  Do you have questions or concerns about your Care? No.  Actions: * If pain score is 4 or above: No action needed, pain <4.

## 2015-01-24 ENCOUNTER — Encounter: Payer: Self-pay | Admitting: Gastroenterology

## 2015-02-20 DIAGNOSIS — R69 Illness, unspecified: Secondary | ICD-10-CM | POA: Diagnosis not present

## 2015-04-03 DIAGNOSIS — H353131 Nonexudative age-related macular degeneration, bilateral, early dry stage: Secondary | ICD-10-CM | POA: Diagnosis not present

## 2015-04-18 ENCOUNTER — Other Ambulatory Visit: Payer: Self-pay | Admitting: Adult Health

## 2015-05-18 DIAGNOSIS — Z0000 Encounter for general adult medical examination without abnormal findings: Secondary | ICD-10-CM | POA: Diagnosis not present

## 2015-05-18 DIAGNOSIS — I10 Essential (primary) hypertension: Secondary | ICD-10-CM | POA: Diagnosis not present

## 2015-07-21 ENCOUNTER — Other Ambulatory Visit: Payer: Self-pay | Admitting: Adult Health

## 2015-07-21 NOTE — Telephone Encounter (Signed)
Ok to refill 

## 2015-08-22 DIAGNOSIS — R69 Illness, unspecified: Secondary | ICD-10-CM | POA: Diagnosis not present

## 2015-09-20 DIAGNOSIS — R69 Illness, unspecified: Secondary | ICD-10-CM | POA: Diagnosis not present

## 2015-11-09 ENCOUNTER — Encounter: Payer: Self-pay | Admitting: Adult Health

## 2015-11-09 ENCOUNTER — Ambulatory Visit (INDEPENDENT_AMBULATORY_CARE_PROVIDER_SITE_OTHER): Payer: Medicare HMO | Admitting: Adult Health

## 2015-11-09 VITALS — BP 112/74 | Temp 98.6°F | Ht 71.0 in | Wt 181.4 lb

## 2015-11-09 DIAGNOSIS — I10 Essential (primary) hypertension: Secondary | ICD-10-CM | POA: Diagnosis not present

## 2015-11-09 DIAGNOSIS — R351 Nocturia: Secondary | ICD-10-CM

## 2015-11-09 DIAGNOSIS — E785 Hyperlipidemia, unspecified: Secondary | ICD-10-CM

## 2015-11-09 DIAGNOSIS — Z0000 Encounter for general adult medical examination without abnormal findings: Secondary | ICD-10-CM

## 2015-11-09 DIAGNOSIS — Z23 Encounter for immunization: Secondary | ICD-10-CM | POA: Diagnosis not present

## 2015-11-09 DIAGNOSIS — E039 Hypothyroidism, unspecified: Secondary | ICD-10-CM | POA: Diagnosis not present

## 2015-11-09 LAB — POC URINALSYSI DIPSTICK (AUTOMATED)
Blood, UA: NEGATIVE
Glucose, UA: NEGATIVE
Ketones, UA: NEGATIVE
Leukocytes, UA: NEGATIVE
Nitrite, UA: NEGATIVE
Spec Grav, UA: 1.025
Urobilinogen, UA: 1
pH, UA: 5.5

## 2015-11-09 LAB — CBC WITH DIFFERENTIAL/PLATELET
Basophils Absolute: 0 10*3/uL (ref 0.0–0.1)
Basophils Relative: 0.7 % (ref 0.0–3.0)
EOS PCT: 7.3 % — AB (ref 0.0–5.0)
Eosinophils Absolute: 0.5 10*3/uL (ref 0.0–0.7)
HEMATOCRIT: 40.5 % (ref 39.0–52.0)
HEMOGLOBIN: 13.1 g/dL (ref 13.0–17.0)
LYMPHS ABS: 1.1 10*3/uL (ref 0.7–4.0)
LYMPHS PCT: 16.6 % (ref 12.0–46.0)
MCHC: 32.5 g/dL (ref 30.0–36.0)
MCV: 77.8 fl — AB (ref 78.0–100.0)
MONOS PCT: 13 % — AB (ref 3.0–12.0)
Monocytes Absolute: 0.8 10*3/uL (ref 0.1–1.0)
NEUTROS PCT: 62.4 % (ref 43.0–77.0)
Neutro Abs: 4 10*3/uL (ref 1.4–7.7)
Platelets: 198 10*3/uL (ref 150.0–400.0)
RBC: 5.21 Mil/uL (ref 4.22–5.81)
RDW: 17.1 % — ABNORMAL HIGH (ref 11.5–15.5)
WBC: 6.4 10*3/uL (ref 4.0–10.5)

## 2015-11-09 LAB — HEPATIC FUNCTION PANEL
ALBUMIN: 4.3 g/dL (ref 3.5–5.2)
ALK PHOS: 49 U/L (ref 39–117)
ALT: 30 U/L (ref 0–53)
AST: 25 U/L (ref 0–37)
Bilirubin, Direct: 0.2 mg/dL (ref 0.0–0.3)
TOTAL PROTEIN: 7.1 g/dL (ref 6.0–8.3)
Total Bilirubin: 1.1 mg/dL (ref 0.2–1.2)

## 2015-11-09 LAB — LIPID PANEL
CHOL/HDL RATIO: 4
Cholesterol: 165 mg/dL (ref 0–200)
HDL: 44.1 mg/dL (ref >39.00)
LDL Cholesterol: 100 mg/dL — ABNORMAL HIGH (ref 0–99)
NonHDL: 120.54
TRIGLYCERIDES: 103 mg/dL (ref 0.0–149.0)
VLDL: 20.6 mg/dL (ref 0.0–40.0)

## 2015-11-09 LAB — BASIC METABOLIC PANEL
BUN: 9 mg/dL (ref 6–23)
CO2: 30 mEq/L (ref 19–32)
Calcium: 9.8 mg/dL (ref 8.4–10.5)
Chloride: 104 mEq/L (ref 96–112)
Creatinine, Ser: 0.93 mg/dL (ref 0.40–1.50)
GFR: 85.12 mL/min (ref >60.00)
GLUCOSE: 106 mg/dL — AB (ref 70–99)
POTASSIUM: 4.5 meq/L (ref 3.5–5.1)
SODIUM: 141 meq/L (ref 135–145)

## 2015-11-09 LAB — PSA: PSA: 2.13 ng/mL (ref 0.10–4.00)

## 2015-11-09 NOTE — Progress Notes (Signed)
Subjective:    Patient ID: Noah King, male    DOB: [birth year], 70 y.o.   MRN: [id number]  HPI  Patient presents for yearly preventative medicine examination. He is a pleasant 70 year old male who  has a past medical history of Cataract; Dupuytren's contracture; GERD (gastroesophageal reflux disease); Hiatal hernia; Hypertension; and Polycythemia.  All immunizations and health maintenance protocols were reviewed with the patient and needed orders were placed. He has had his flu shot.   Appropriate screening laboratory values were ordered for the patient including screening of hyperlipidemia, renal function and hepatic function. If indicated by BPH, a PSA was ordered.  Medication reconciliation,  past medical history, social history, problem list and allergies were reviewed in detail with the patient  Goals were established with regard to weight loss, exercise, and  diet in compliance with medications. He is walking nightly and is staying active. He eats healthy.   End of life planning was discussed. He has a POA, living will and advanced directive   He denies any interval history.   His last colonoscopy was in 2017. He sees his dentist and eye doctor.   States " I am feeling really good".  He does find that his stream has decreased slightly and that he is having to wake up one time a night to urinate. This is not an issue for him.   Review of Systems  Constitutional: Negative.   HENT: Negative.   Eyes: Negative.   Respiratory: Negative.   Cardiovascular: Negative.   Gastrointestinal: Negative.   Endocrine: Negative.   Genitourinary: Negative.   Musculoskeletal: Negative.   Skin: Negative.   Allergic/Immunologic: Negative.   Neurological: Negative.   Psychiatric/Behavioral: Negative.    Past Medical History:  Diagnosis Date  . Cataract    lft extraction  . Dupuytren's contracture   . GERD (gastroesophageal reflux disease)   . Hiatal hernia   . Hypertension   .  Polycythemia     Social History   Social History  . Marital status: Single    Spouse name: N/A  . Number of children: N/A  . Years of education: N/A   Occupational History  . Not on file.   Social History Main Topics  . Smoking status: Former Smoker    Quit date: 01/14/1985  . Smokeless tobacco: Never Used  . Alcohol use 0.0 oz/week     Comment: beer daily   . Drug use: No  . Sexual activity: Yes    Partners: Female   Other Topics Concern  . Not on file   Social History Narrative   Retired- He worked at [employer]   Divorced   One daughter who lives in [place]    Past Surgical History:  Procedure Laterality Date  . cataract extraction left eye    . COLONOSCOPY    . DUPUYTREN CONTRACTURE RELEASE Left   . rt knee arthroscopic      Family History  Problem Relation Age of Onset  . Colon cancer Father   . Stroke Mother   . Cancer Sister     breast     No Known Allergies  Current Outpatient Prescriptions on File Prior to Visit  Medication Sig Dispense Refill  . aspirin 81 MG tablet Take 81 mg by mouth daily.      . benazepril (LOTENSIN) 10 MG tablet TAKE ONE TABLET (10 MG) BY MOUTH DAILY. 90 tablet 3  . Multiple Vitamin (MULTIVITAMIN) tablet Take 1 tablet by mouth daily.    Marland Kitchen  sildenafil (REVATIO) 20 MG tablet TAKE ONE TABLET (20 MG) BY MOUTH DAILY AS NEEDED. 30 tablet 3  . verapamil (CALAN-SR) 180 MG CR tablet TAKE ONE TABLET (180MG ) BY MOUTH AT BEDTIME. 90 tablet 3   No current facility-administered medications on file prior to visit.     BP 112/74   Temp 98.6 F (37 C) (Oral)   Ht 5\' 11"  (1.803 m)   Wt 181 lb 6.4 oz (82.3 kg)   BMI 25.30 kg/m       Objective:   Physical Exam  Constitutional: He is oriented to person, place, and time. He appears well-developed and well-nourished. No distress.  HENT:  Head: Normocephalic and atraumatic.  Right Ear: External ear normal.  Left Ear: External ear normal.  Nose: Nose normal.  Mouth/Throat: Oropharynx is  clear and moist. No oropharyngeal exudate.  Eyes: Conjunctivae and EOM are normal. Pupils are equal, round, and reactive to light. Right eye exhibits no discharge. Left eye exhibits no discharge. No scleral icterus.  Neck: Normal range of motion. Neck supple. Carotid bruit is not present. No tracheal deviation present. No thyromegaly present.  Cardiovascular: Normal rate, regular rhythm, normal heart sounds and intact distal pulses.  Exam reveals no gallop and no friction rub.   No murmur heard. Pulmonary/Chest: Effort normal and breath sounds normal. No stridor. No respiratory distress. He has no wheezes. He has no rales. He exhibits no tenderness.  Abdominal: Soft. Bowel sounds are normal. He exhibits no distension and no mass. There is no tenderness. There is no rebound and no guarding.  Genitourinary: Rectum normal. Prostate is enlarged. Prostate is not tender.  Musculoskeletal: Normal range of motion. He exhibits no edema, tenderness or deformity.  Lymphadenopathy:    He has no cervical adenopathy.  Neurological: He is alert and oriented to person, place, and time. He displays normal reflexes. No cranial nerve deficit. He exhibits normal muscle tone. Coordination normal.  Skin: Skin is warm and dry. No rash noted. He is not diaphoretic. No erythema. No pallor.  Psychiatric: He has a normal mood and affect. His behavior is normal. Judgment and thought content normal.  Nursing note and vitals reviewed.      Assessment & Plan:  1. Routine general medical examination at a health care facility - Benign exam  - Basic metabolic panel - CBC with Differential/Platelet - Hepatic function panel - Lipid panel - PSA - POCT Urinalysis Dipstick (Automated) - EKG 12-Lead- Sinus Brady, Rate 55. Consistent with previous EKG's - Continue to exercise and eat healthy  - Follow up in one year  2. Essential hypertension - Controlled on current medication - Basic metabolic panel - CBC with  Differential/Platelet - Hepatic function panel - Lipid panel - PSA - POCT Urinalysis Dipstick (Automated) - EKG 12-Lead  3. Hypothyroidism, unspecified type  - Basic metabolic panel - CBC with Differential/Platelet - Hepatic function panel - Lipid panel - PSA - POCT Urinalysis Dipstick (Automated) - EKG 12-Lead  4. Hyperlipidemia, unspecified hyperlipidemia type  - Basic metabolic panel - CBC with Differential/Platelet - Hepatic function panel - Lipid panel - PSA - POCT Urinalysis Dipstick (Automated) - EKG 12-Lead  6. Nocturia - DUE to BPH. He does not want to go on any medication at this time.  - Advised Saw Chidester.  - Follow up as needed  [name], NP

## 2015-11-09 NOTE — Patient Instructions (Signed)
It was great seeing you today!  Your exam was great  I will follow up with you regarding your blood work.   You can try Saw [place]metto for the prostate issues  Follow up with me in one month or sooner if needed  Health Maintenance, Male A healthy lifestyle and preventative care can promote health and wellness.  Maintain regular health, dental, and eye exams.  Eat a healthy diet. Foods like vegetables, fruits, whole grains, low-fat dairy products, and lean protein foods contain the nutrients you need and are low in calories. Decrease your intake of foods high in solid fats, added sugars, and salt. Get information about a proper diet from your health care provider, if necessary.  Regular physical exercise is one of the most important things you can do for your health. Most adults should get at least 150 minutes of moderate-intensity exercise (any activity that increases your heart rate and causes you to sweat) each week. In addition, most adults need muscle-strengthening exercises on 2 or more days a week.   Maintain a healthy weight. The body mass index (BMI) is a screening tool to identify possible weight problems. It provides an estimate of body fat based on height and weight. Your health care provider can find your BMI and can help you achieve or maintain a healthy weight. For males 20 years and older:  A BMI below 18.5 is considered underweight.  A BMI of 18.5 to 24.9 is normal.  A BMI of 25 to 29.9 is considered overweight.  A BMI of 30 and above is considered obese.  Maintain normal blood lipids and cholesterol by exercising and minimizing your intake of saturated fat. Eat a balanced diet with plenty of fruits and vegetables. Blood tests for lipids and cholesterol should begin at age 4 and be repeated every 5 years. If your lipid or cholesterol levels are high, you are over age 28, or you are at high risk for heart disease, you may need your cholesterol levels checked more  frequently.Ongoing high lipid and cholesterol levels should be treated with medicines if diet and exercise are not working.  If you smoke, find out from your health care provider how to quit. If you do not use tobacco, do not start.  Lung cancer screening is recommended for adults aged 14-80 years who are at high risk for developing lung cancer because of a history of smoking. A yearly low-dose CT scan of the lungs is recommended for people who have at least a 30-pack-year history of smoking and are current smokers or have quit within the past 15 years. A pack year of smoking is smoking an average of 1 pack of cigarettes a day for 1 year (for example, a 30-pack-year history of smoking could mean smoking 1 pack a day for 30 years or 2 packs a day for 15 years). Yearly screening should continue until the smoker has stopped smoking for at least 15 years. Yearly screening should be stopped for people who develop a health problem that would prevent them from having lung cancer treatment.  If you choose to drink alcohol, do not have more than 2 drinks per day. One drink is considered to be 12 oz (360 mL) of beer, 5 oz (150 mL) of wine, or 1.5 oz (45 mL) of liquor.  Avoid the use of street drugs. Do not share needles with anyone. Ask for help if you need support or instructions about stopping the use of drugs.  High blood pressure causes heart  disease and increases the risk of stroke. High blood pressure is more likely to develop in:  People who have blood pressure in the end of the normal range (100-139/85-89 mm Hg).  People who are overweight or obese.  People who are African American.  If you are 69-14 years of age, have your blood pressure checked every 3-5 years. If you are 63 years of age or older, have your blood pressure checked every year. You should have your blood pressure measured twice--once when you are at a hospital or clinic, and once when you are not at a hospital or clinic. Record the  average of the two measurements. To check your blood pressure when you are not at a hospital or clinic, you can use:  An automated blood pressure machine at a pharmacy.  A home blood pressure monitor.  If you are 62-68 years old, ask your health care provider if you should take aspirin to prevent heart disease.  Diabetes screening involves taking a blood sample to check your fasting blood sugar level. This should be done once every 3 years after age 51 if you are at a normal weight and without risk factors for diabetes. Testing should be considered at a younger age or be carried out more frequently if you are overweight and have at least 1 risk factor for diabetes.  Colorectal cancer can be detected and often prevented. Most routine colorectal cancer screening begins at the age of 73 and continues through age 11. However, your health care provider may recommend screening at an earlier age if you have risk factors for colon cancer. On a yearly basis, your health care provider may provide home test kits to check for hidden blood in the stool. A small camera at the end of a tube may be used to directly examine the colon (sigmoidoscopy or colonoscopy) to detect the earliest forms of colorectal cancer. Talk to your health care provider about this at age 58 when routine screening begins. A direct exam of the colon should be repeated every 5-10 years through age 77, unless early forms of precancerous polyps or small growths are found.  People who are at an increased risk for hepatitis B should be screened for this virus. You are considered at high risk for hepatitis B if:  You were born in a country where hepatitis B occurs often. Talk with your health care provider about which countries are considered high risk.  Your parents were born in a high-risk country and you have not received a shot to protect against hepatitis B (hepatitis B vaccine).  You have HIV or AIDS.  You use needles to inject street  drugs.  You live with, or have sex with, someone who has hepatitis B.  You are a man who has sex with other men (MSM).  You get hemodialysis treatment.  You take certain medicines for conditions like cancer, organ transplantation, and autoimmune conditions.  Hepatitis C blood testing is recommended for all people born from [birth year] through 1966 and any individual with known risk factors for hepatitis C.  Healthy men should no longer receive prostate-specific antigen (PSA) blood tests as part of routine cancer screening. Talk to your health care provider about prostate cancer screening.  Testicular cancer screening is not recommended for adolescents or adult males who have no symptoms. Screening includes self-exam, a health care provider exam, and other screening tests. Consult with your health care provider about any symptoms you have or any concerns you have about testicular  cancer.  Practice safe sex. Use condoms and avoid high-risk sexual practices to reduce the spread of sexually transmitted infections (STIs).  You should be screened for STIs, including gonorrhea and chlamydia if:  You are sexually active and are younger than 24 years.  You are older than 24 years, and your health care provider tells you that you are at risk for this type of infection.  Your sexual activity has changed since you were last screened, and you are at an increased risk for chlamydia or gonorrhea. Ask your health care provider if you are at risk.  If you are at risk of being infected with HIV, it is recommended that you take a prescription medicine daily to prevent HIV infection. This is called pre-exposure prophylaxis (PrEP). You are considered at risk if:  You are a man who has sex with other men (MSM).  You are a heterosexual man who is sexually active with multiple partners.  You take drugs by injection.  You are sexually active with a partner who has HIV.  Talk with your health care provider about  whether you are at high risk of being infected with HIV. If you choose to begin PrEP, you should first be tested for HIV. You should then be tested every 3 months for as long as you are taking PrEP.  Use sunscreen. Apply sunscreen liberally and repeatedly throughout the day. You should seek shade when your shadow is shorter than you. Protect yourself by wearing long sleeves, pants, a wide-brimmed hat, and sunglasses year round whenever you are outdoors.  Tell your health care provider of new moles or changes in moles, especially if there is a change in shape or color. Also, tell your health care provider if a mole is larger than the size of a pencil eraser.  A one-time screening for abdominal aortic aneurysm (AAA) and surgical repair of large AAAs by ultrasound is recommended for men aged 41-75 years who are current or former smokers.  Stay current with your vaccines (immunizations).   This information is not intended to replace advice given to you by your health care provider. Make sure you discuss any questions you have with your health care provider.   Document Released: 06/29/2007 Document Revised: 01/21/2014 Document Reviewed: 05/28/2010 Elsevier Interactive Patient Education [employer].

## 2015-11-10 ENCOUNTER — Encounter: Payer: Medicare HMO | Admitting: Adult Health

## 2016-02-05 DIAGNOSIS — Z79899 Other long term (current) drug therapy: Secondary | ICD-10-CM | POA: Diagnosis not present

## 2016-02-05 DIAGNOSIS — M159 Polyosteoarthritis, unspecified: Secondary | ICD-10-CM | POA: Diagnosis not present

## 2016-02-05 DIAGNOSIS — G629 Polyneuropathy, unspecified: Secondary | ICD-10-CM | POA: Diagnosis not present

## 2016-02-05 DIAGNOSIS — I10 Essential (primary) hypertension: Secondary | ICD-10-CM | POA: Diagnosis not present

## 2016-02-05 DIAGNOSIS — E663 Overweight: Secondary | ICD-10-CM | POA: Diagnosis not present

## 2016-02-05 DIAGNOSIS — J301 Allergic rhinitis due to pollen: Secondary | ICD-10-CM | POA: Diagnosis not present

## 2016-02-05 DIAGNOSIS — K08409 Partial loss of teeth, unspecified cause, unspecified class: Secondary | ICD-10-CM | POA: Diagnosis not present

## 2016-02-05 DIAGNOSIS — R42 Dizziness and giddiness: Secondary | ICD-10-CM | POA: Diagnosis not present

## 2016-02-05 DIAGNOSIS — Z972 Presence of dental prosthetic device (complete) (partial): Secondary | ICD-10-CM | POA: Diagnosis not present

## 2016-02-05 DIAGNOSIS — L905 Scar conditions and fibrosis of skin: Secondary | ICD-10-CM | POA: Diagnosis not present

## 2016-02-05 DIAGNOSIS — H259 Unspecified age-related cataract: Secondary | ICD-10-CM | POA: Diagnosis not present

## 2016-02-05 DIAGNOSIS — Z0000 Encounter for general adult medical examination without abnormal findings: Secondary | ICD-10-CM | POA: Diagnosis not present

## 2016-02-05 DIAGNOSIS — Z862 Personal history of diseases of the blood and blood-forming organs and certain disorders involving the immune mechanism: Secondary | ICD-10-CM | POA: Diagnosis not present

## 2016-02-05 DIAGNOSIS — Z87891 Personal history of nicotine dependence: Secondary | ICD-10-CM | POA: Diagnosis not present

## 2016-02-27 DIAGNOSIS — R69 Illness, unspecified: Secondary | ICD-10-CM | POA: Diagnosis not present

## 2016-03-28 DIAGNOSIS — R69 Illness, unspecified: Secondary | ICD-10-CM | POA: Diagnosis not present

## 2016-04-04 DIAGNOSIS — H353131 Nonexudative age-related macular degeneration, bilateral, early dry stage: Secondary | ICD-10-CM | POA: Diagnosis not present

## 2016-04-04 DIAGNOSIS — H2511 Age-related nuclear cataract, right eye: Secondary | ICD-10-CM | POA: Diagnosis not present

## 2016-04-04 DIAGNOSIS — H3589 Other specified retinal disorders: Secondary | ICD-10-CM | POA: Diagnosis not present

## 2016-04-04 DIAGNOSIS — H25011 Cortical age-related cataract, right eye: Secondary | ICD-10-CM | POA: Diagnosis not present

## 2016-05-02 ENCOUNTER — Other Ambulatory Visit: Payer: Self-pay | Admitting: Adult Health

## 2016-06-19 IMAGING — MR MR CERVICAL SPINE W/O CM
4 of 5 series · 19 of 48 positions shown · non-contrast
Comparison: None.

CLINICAL DATA: Gait instability.

EXAM:
MRI CERVICAL SPINE WITHOUT CONTRAST
TECHNIQUE: Multiplanar, multisequence MR imaging of the cervical spine was
performed. No intravenous contrast was administered.

[Series 200: T2 · sagittal · 3.0mm · 0.47mm/px · 8 of 13 slices shown (1 of 2)]
[im 1/13]
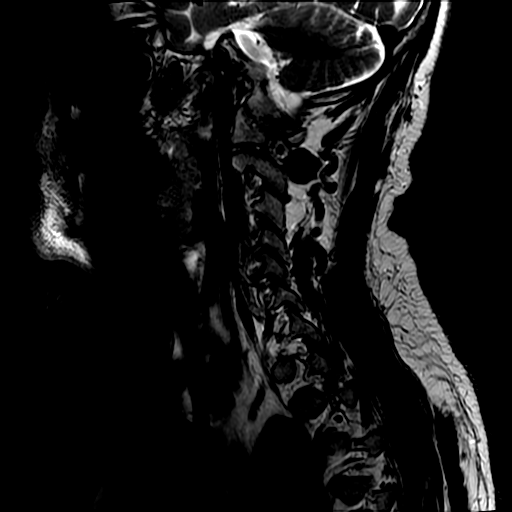
[im 2/13]
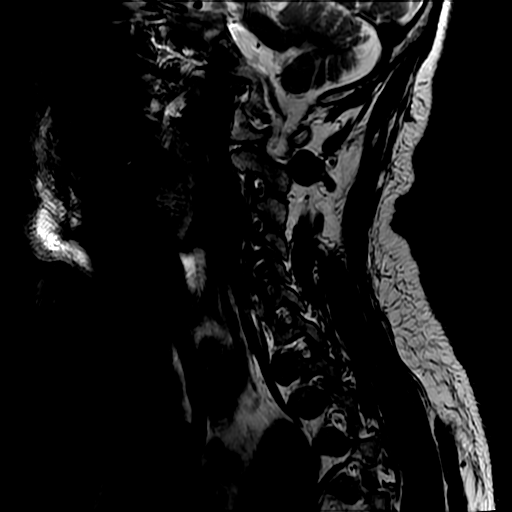
[im 4/13]
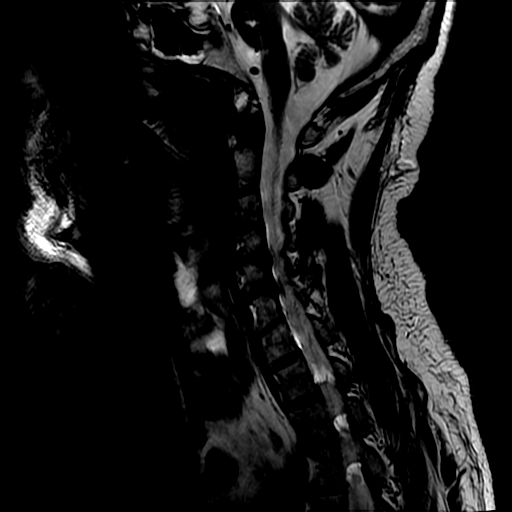
[im 6/13]
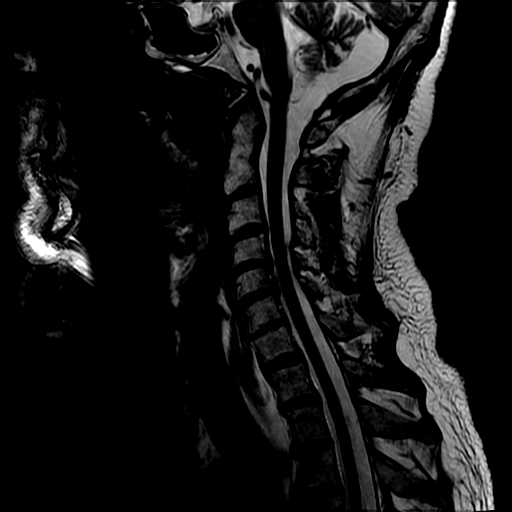
[im 7/13]
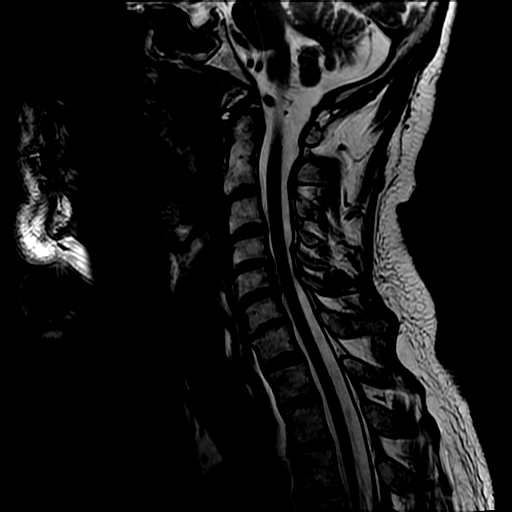
[im 9/13]
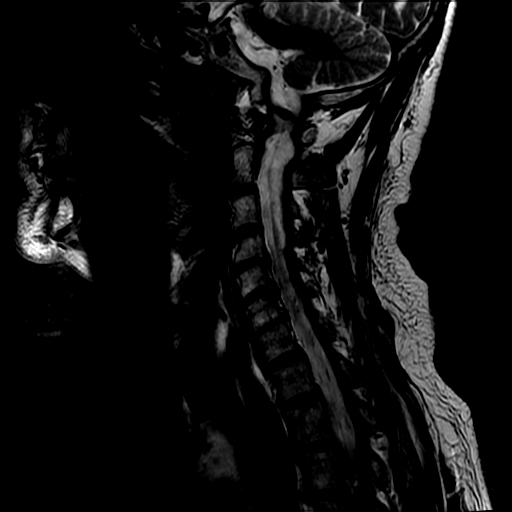
[im 11/13]
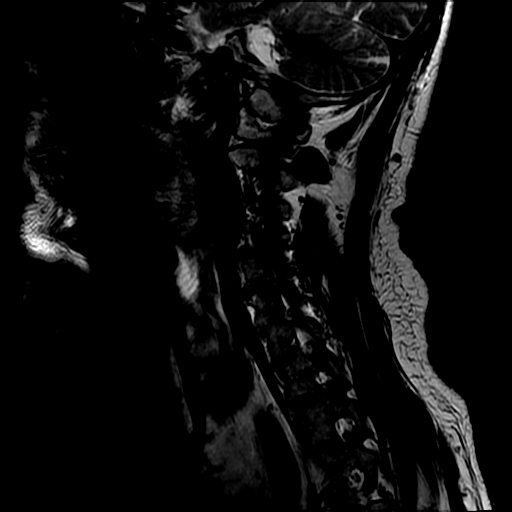
[im 13/13]
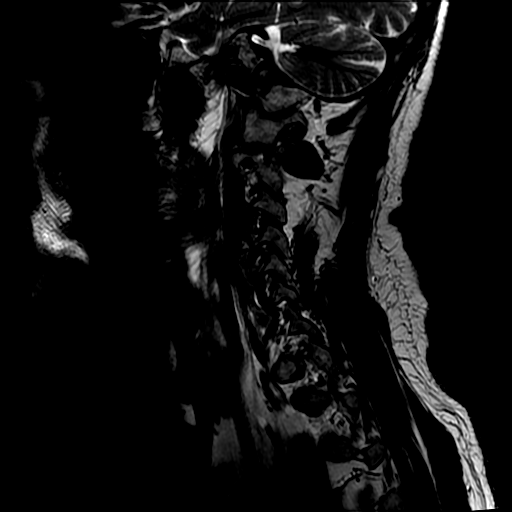

[Series 300: FLAIR · sagittal · 3.0mm · 0.47mm/px · 3 of 13 slices shown]
[im 2/13]
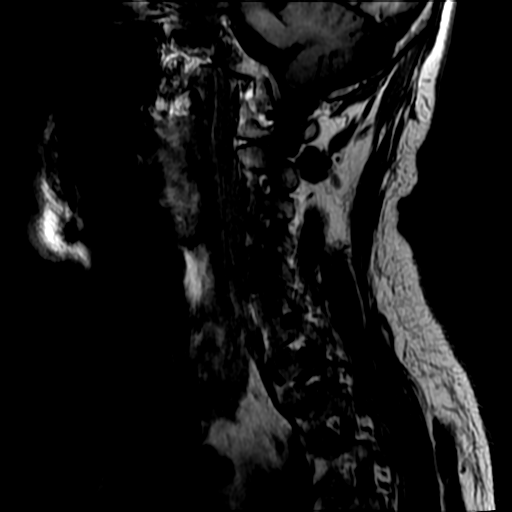
[im 7/13]
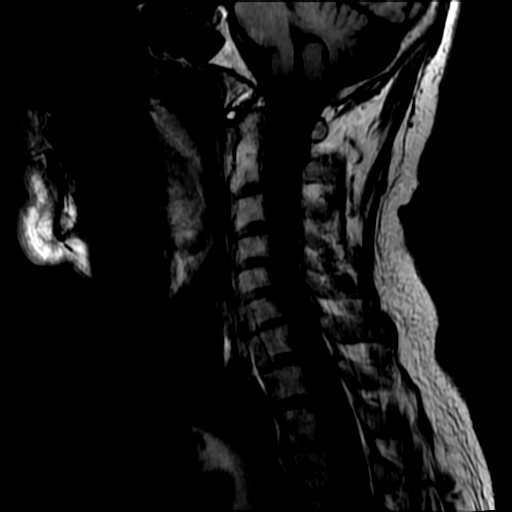
[im 11/13]
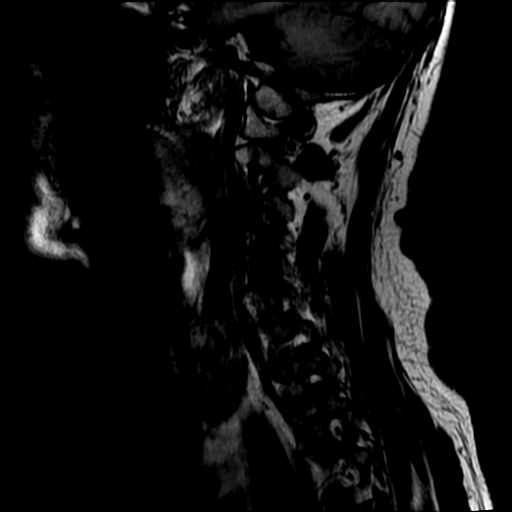

[Series 400: STIR · sagittal · 3.0mm · 0.47mm/px · 3 of 13 slices shown]
[im 3/13]
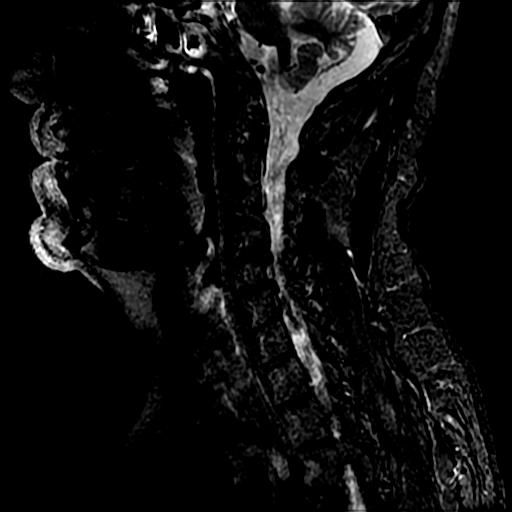
[im 7/13]
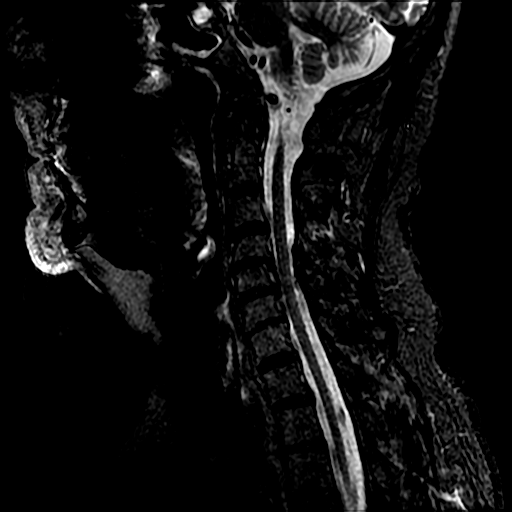
[im 11/13]
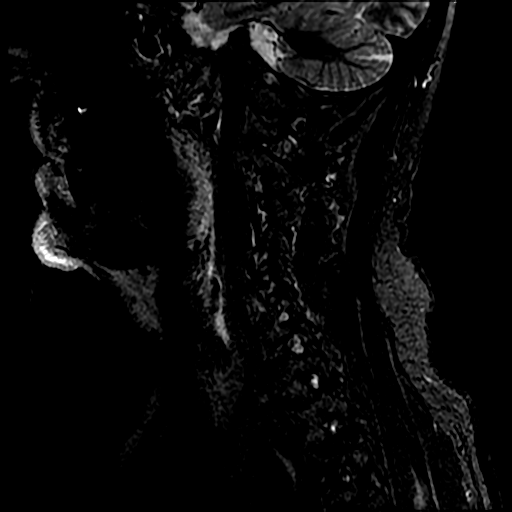

[Series 600: T2 · axial · 3.0mm · 0.35mm/px · z∈[-110,-81]mm · 5 of 13 slices shown (2 of 2)]
[im 1/13]
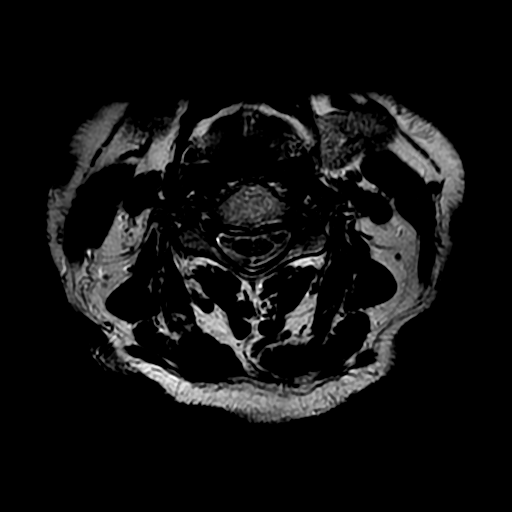
[im 3/13]
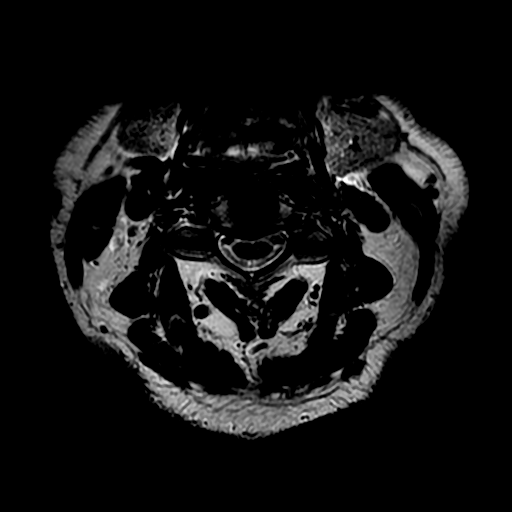
[im 5/13]
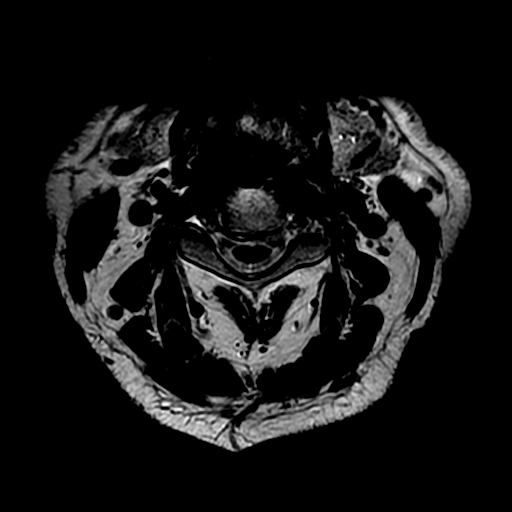
[im 7/13]
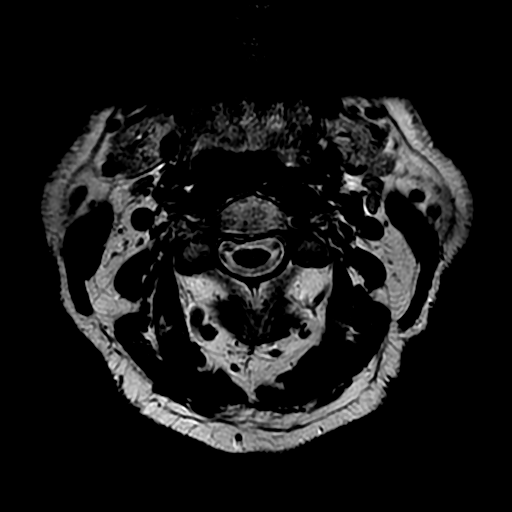
[im 11/13]
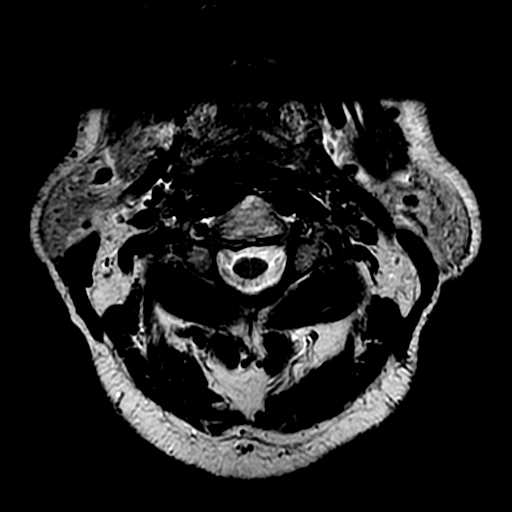

[19 of 48 positions shown; findings below may reference images not displayed]

FINDINGS: Normal signal is present in the cervical and upper thoracic spinal
cord to the lowest imaged level, T3-4. Marrow signal and vertebral
body heights are normal. There is slight retrolisthesis at C4-5.
Alignment is otherwise anatomic. The craniocervical junction is
within normal limits. A remote lacunar infarct is noted within the
central pons. The visualized intracranial contents are otherwise
normal.

C2-3:  Negative.

C3-4: A rightward disc osteophyte complex is present. Uncovertebral
spurring is asymmetric on the right. Mild central and right
foraminal narrowing is evident.

C4-5: A broad-based disc osteophyte complex effaces the ventral CSF
and slightly distorts the ventral surface of the cord. Uncovertebral
spurring results in moderate foraminal narrowing bilaterally.

C5-6: A broad-based disc osteophyte complex is present. There is
effacement of ventral CSF. Moderate foraminal narrowing is evident
bilaterally.

C6-7: A leftward disc osteophyte complex is present. Mild left
central and foraminal narrowing is present.

C7-T1:  Negative.
IMPRESSION: 1. Mild central and right foraminal narrowing at C3-4.
2. Moderate central and bilateral foraminal narrowing at C4-5.
3. Mild to moderate central and moderate foraminal stenosis
bilaterally at C5-6.
4. Mild left central and foraminal narrowing at C6-7.
5. Slight degenerative retrolisthesis at C4-5

## 2016-08-01 ENCOUNTER — Other Ambulatory Visit: Payer: Self-pay | Admitting: Adult Health

## 2016-08-01 NOTE — Telephone Encounter (Signed)
Ok to refill 

## 2016-08-29 DIAGNOSIS — R69 Illness, unspecified: Secondary | ICD-10-CM | POA: Diagnosis not present

## 2016-10-04 ENCOUNTER — Encounter: Payer: Self-pay | Admitting: Adult Health

## 2016-10-30 ENCOUNTER — Other Ambulatory Visit: Payer: Self-pay | Admitting: Adult Health

## 2016-10-31 NOTE — Telephone Encounter (Signed)
Sent to the pharmacy by e-scribe for 90 days. Pt has appt on 11/12/16 for cpx.

## 2016-11-12 ENCOUNTER — Ambulatory Visit (INDEPENDENT_AMBULATORY_CARE_PROVIDER_SITE_OTHER): Payer: Medicare HMO | Admitting: Adult Health

## 2016-11-12 ENCOUNTER — Encounter: Payer: Self-pay | Admitting: Adult Health

## 2016-11-12 VITALS — BP 140/70 | Temp 98.1°F | Ht 70.5 in | Wt 180.0 lb

## 2016-11-12 DIAGNOSIS — Z0000 Encounter for general adult medical examination without abnormal findings: Secondary | ICD-10-CM

## 2016-11-12 DIAGNOSIS — Z23 Encounter for immunization: Secondary | ICD-10-CM

## 2016-11-12 DIAGNOSIS — I10 Essential (primary) hypertension: Secondary | ICD-10-CM

## 2016-11-12 DIAGNOSIS — E785 Hyperlipidemia, unspecified: Secondary | ICD-10-CM

## 2016-11-12 DIAGNOSIS — Z125 Encounter for screening for malignant neoplasm of prostate: Secondary | ICD-10-CM

## 2016-11-12 LAB — BASIC METABOLIC PANEL
BUN: 11 mg/dL (ref 6–23)
CHLORIDE: 103 meq/L (ref 96–112)
CO2: 29 meq/L (ref 19–32)
CREATININE: 0.79 mg/dL (ref 0.40–1.50)
Calcium: 9.5 mg/dL (ref 8.4–10.5)
GFR: 102.46 mL/min (ref >60.00)
Glucose, Bld: 96 mg/dL (ref 70–99)
Potassium: 4.2 mEq/L (ref 3.5–5.1)
Sodium: 141 mEq/L (ref 135–145)

## 2016-11-12 LAB — HEPATIC FUNCTION PANEL
ALT: 26 U/L (ref 0–53)
AST: 22 U/L (ref 0–37)
Albumin: 4 g/dL (ref 3.5–5.2)
Alkaline Phosphatase: 41 U/L (ref 39–117)
BILIRUBIN DIRECT: 0.2 mg/dL (ref 0.0–0.3)
BILIRUBIN TOTAL: 0.8 mg/dL (ref 0.2–1.2)
TOTAL PROTEIN: 6.6 g/dL (ref 6.0–8.3)

## 2016-11-12 LAB — LIPID PANEL
CHOL/HDL RATIO: 4
CHOLESTEROL: 178 mg/dL (ref 0–200)
HDL: 48.4 mg/dL (ref >39.00)
LDL Cholesterol: 114 mg/dL — ABNORMAL HIGH (ref 0–99)
NonHDL: 129.14
TRIGLYCERIDES: 77 mg/dL (ref 0.0–149.0)
VLDL: 15.4 mg/dL (ref 0.0–40.0)

## 2016-11-12 LAB — CBC WITH DIFFERENTIAL/PLATELET
BASOS PCT: 1.1 % (ref 0.0–3.0)
Basophils Absolute: 0.1 10*3/uL (ref 0.0–0.1)
EOS ABS: 0.5 10*3/uL (ref 0.0–0.7)
EOS PCT: 9.8 % — AB (ref 0.0–5.0)
HEMATOCRIT: 43.4 % (ref 39.0–52.0)
HEMOGLOBIN: 13.8 g/dL (ref 13.0–17.0)
Lymphocytes Relative: 17.6 % (ref 12.0–46.0)
Lymphs Abs: 0.9 10*3/uL (ref 0.7–4.0)
MCHC: 31.7 g/dL (ref 30.0–36.0)
MCV: 79.2 fl (ref 78.0–100.0)
MONO ABS: 0.7 10*3/uL (ref 0.1–1.0)
Monocytes Relative: 13.7 % — ABNORMAL HIGH (ref 3.0–12.0)
NEUTROS ABS: 3.1 10*3/uL (ref 1.4–7.7)
Neutrophils Relative %: 57.8 % (ref 43.0–77.0)
PLATELETS: 180 10*3/uL (ref 150.0–400.0)
RBC: 5.49 Mil/uL (ref 4.22–5.81)
RDW: 21.3 % — AB (ref 11.5–15.5)
WBC: 5.3 10*3/uL (ref 4.0–10.5)

## 2016-11-12 LAB — PSA: PSA: 2.01 ng/mL (ref 0.10–4.00)

## 2016-11-12 NOTE — Patient Instructions (Signed)
It was great seeing you today!  Your exam was great! Keep up the good work   I will follow up with you regarding your blood work   Please let me know if you need anything   Health Maintenance, Male A healthy lifestyle and preventative care can promote health and wellness.  Maintain regular health, dental, and eye exams.  Eat a healthy diet. Foods like vegetables, fruits, whole grains, low-fat dairy products, and lean protein foods contain the nutrients you need and are low in calories. Decrease your intake of foods high in solid fats, added sugars, and salt. Get information about a proper diet from your health care provider, if necessary.  Regular physical exercise is one of the most important things you can do for your health. Most adults should get at least 150 minutes of moderate-intensity exercise (any activity that increases your heart rate and causes you to sweat) each week. In addition, most adults need muscle-strengthening exercises on 2 or more days a week.   Maintain a healthy weight. The body mass index (BMI) is a screening tool to identify possible weight problems. It provides an estimate of body fat based on height and weight. Your health care provider can find your BMI and can help you achieve or maintain a healthy weight. For males 20 years and older:  A BMI below 18.5 is considered underweight.  A BMI of 18.5 to 24.9 is normal.  A BMI of 25 to 29.9 is considered overweight.  A BMI of 30 and above is considered obese.  Maintain normal blood lipids and cholesterol by exercising and minimizing your intake of saturated fat. Eat a balanced diet with plenty of fruits and vegetables. Blood tests for lipids and cholesterol should begin at age 75 and be repeated every 5 years. If your lipid or cholesterol levels are high, you are over age 67, or you are at high risk for heart disease, you may need your cholesterol levels checked more frequently.Ongoing high lipid and cholesterol  levels should be treated with medicines if diet and exercise are not working.  If you smoke, find out from your health care provider how to quit. If you do not use tobacco, do not start.  Lung cancer screening is recommended for adults aged 38-80 years who are at high risk for developing lung cancer because of a history of smoking. A yearly low-dose CT scan of the lungs is recommended for people who have at least a 30-pack-year history of smoking and are current smokers or have quit within the past 15 years. A pack year of smoking is smoking an average of 1 pack of cigarettes a day for 1 year (for example, a 30-pack-year history of smoking could mean smoking 1 pack a day for 30 years or 2 packs a day for 15 years). Yearly screening should continue until the smoker has stopped smoking for at least 15 years. Yearly screening should be stopped for people who develop a health problem that would prevent them from having lung cancer treatment.  If you choose to drink alcohol, do not have more than 2 drinks per day. One drink is considered to be 12 oz (360 mL) of beer, 5 oz (150 mL) of wine, or 1.5 oz (45 mL) of liquor.  Avoid the use of street drugs. Do not share needles with anyone. Ask for help if you need support or instructions about stopping the use of drugs.  High blood pressure causes heart disease and increases the risk of  stroke. High blood pressure is more likely to develop in:  People who have blood pressure in the end of the normal range (100-139/85-89 mm Hg).  People who are overweight or obese.  People who are African American.  If you are 52-34 years of age, have your blood pressure checked every 3-5 years. If you are 46 years of age or older, have your blood pressure checked every year. You should have your blood pressure measured twice--once when you are at a hospital or clinic, and once when you are not at a hospital or clinic. Record the average of the two measurements. To check your  blood pressure when you are not at a hospital or clinic, you can use:  An automated blood pressure machine at a pharmacy.  A home blood pressure monitor.  If you are 28-47 years old, ask your health care provider if you should take aspirin to prevent heart disease.  Diabetes screening involves taking a blood sample to check your fasting blood sugar level. This should be done once every 3 years after age 54 if you are at a normal weight and without risk factors for diabetes. Testing should be considered at a younger age or be carried out more frequently if you are overweight and have at least 1 risk factor for diabetes.  Colorectal cancer can be detected and often prevented. Most routine colorectal cancer screening begins at the age of 50 and continues through age 85. However, your health care provider may recommend screening at an earlier age if you have risk factors for colon cancer. On a yearly basis, your health care provider may provide home test kits to check for hidden blood in the stool. A small camera at the end of a tube may be used to directly examine the colon (sigmoidoscopy or colonoscopy) to detect the earliest forms of colorectal cancer. Talk to your health care provider about this at age 9 when routine screening begins. A direct exam of the colon should be repeated every 5-10 years through age 38, unless early forms of precancerous polyps or small growths are found.  People who are at an increased risk for hepatitis B should be screened for this virus. You are considered at high risk for hepatitis B if:  You were born in a country where hepatitis B occurs often. Talk with your health care provider about which countries are considered high risk.  Your parents were born in a high-risk country and you have not received a shot to protect against hepatitis B (hepatitis B vaccine).  You have HIV or AIDS.  You use needles to inject street drugs.  You live with, or have sex with,  someone who has hepatitis B.  You are a man who has sex with other men (MSM).  You get hemodialysis treatment.  You take certain medicines for conditions like cancer, organ transplantation, and autoimmune conditions.  Hepatitis C blood testing is recommended for all people born from [birth year] through 1966 and any individual with known risk factors for hepatitis C.  Healthy men should no longer receive prostate-specific antigen (PSA) blood tests as part of routine cancer screening. Talk to your health care provider about prostate cancer screening.  Testicular cancer screening is not recommended for adolescents or adult males who have no symptoms. Screening includes self-exam, a health care provider exam, and other screening tests. Consult with your health care provider about any symptoms you have or any concerns you have about testicular cancer.  Practice safe sex. Use  condoms and avoid high-risk sexual practices to reduce the spread of sexually transmitted infections (STIs).  You should be screened for STIs, including gonorrhea and chlamydia if:  You are sexually active and are younger than 24 years.  You are older than 24 years, and your health care provider tells you that you are at risk for this type of infection.  Your sexual activity has changed since you were last screened, and you are at an increased risk for chlamydia or gonorrhea. Ask your health care provider if you are at risk.  If you are at risk of being infected with HIV, it is recommended that you take a prescription medicine daily to prevent HIV infection. This is called pre-exposure prophylaxis (PrEP). You are considered at risk if:  You are a man who has sex with other men (MSM).  You are a heterosexual man who is sexually active with multiple partners.  You take drugs by injection.  You are sexually active with a partner who has HIV.  Talk with your health care provider about whether you are at high risk of being  infected with HIV. If you choose to begin PrEP, you should first be tested for HIV. You should then be tested every 3 months for as long as you are taking PrEP.  Use sunscreen. Apply sunscreen liberally and repeatedly throughout the day. You should seek shade when your shadow is shorter than you. Protect yourself by wearing long sleeves, pants, a wide-brimmed hat, and sunglasses year round whenever you are outdoors.  Tell your health care provider of new moles or changes in moles, especially if there is a change in shape or color. Also, tell your health care provider if a mole is larger than the size of a pencil eraser.  A one-time screening for abdominal aortic aneurysm (AAA) and surgical repair of large AAAs by ultrasound is recommended for men aged 73-75 years who are current or former smokers.  Stay current with your vaccines (immunizations).   This information is not intended to replace advice given to you by your health care provider. Make sure you discuss any questions you have with your health care provider.   Document Released: 06/29/2007 Document Revised: 01/21/2014 Document Reviewed: 05/28/2010 Elsevier Interactive Patient Education [employer].

## 2016-11-12 NOTE — Progress Notes (Signed)
Subjective:    Patient ID: Noah King, male    DOB: 13-Aug-1945, 71 y.o.   MRN: [id number]  HPI  Patient presents for yearly preventative medicine examination. He is a pleasant 71 year old male who  has a past medical history of Cataract; Dupuytren's contracture; GERD (gastroesophageal reflux disease); Hiatal hernia; Hypertension; and Polycythemia.  He takes Lotensin 10 mg and Verapamil 180 mg for hypertension  His hyperlipidemia is diet controlled.   All immunizations and health maintenance protocols were reviewed with the patient and needed orders were placed.  Appropriate screening laboratory values were ordered for the patient including screening of hyperlipidemia, renal function and hepatic function. If indicated by BPH, a PSA was ordered.  Medication reconciliation,  past medical history, social history, problem list and allergies were reviewed in detail with the patient  Goals were established with regard to weight loss, exercise, and  diet in compliance with medications. He walks nightly and is eating a heart healthy diet   End of life planning was discussed. He has an advanced directive and living will.   He is up to date on colonoscopy. He does not routinely get dental or vision checks done.   He denies any interval history and does not have any acute complaints today    Review of Systems  Constitutional: Negative.   HENT: Negative.   Eyes: Negative.   Respiratory: Negative.   Cardiovascular: Negative.   Gastrointestinal: Negative.   Endocrine: Negative.   Genitourinary: Negative.   Musculoskeletal: Negative.   Skin: Negative.   Allergic/Immunologic: Negative.   Neurological: Negative.   Hematological: Negative.   Psychiatric/Behavioral: Negative.   All other systems reviewed and are negative.  Past Medical History:  Diagnosis Date  . Cataract    lft extraction  . Dupuytren's contracture   . GERD (gastroesophageal reflux disease)   . Hiatal hernia    . Hypertension   . Polycythemia     Social History   Social History  . Marital status: Single    Spouse name: N/A  . Number of children: N/A  . Years of education: N/A   Occupational History  . Not on file.   Social History Main Topics  . Smoking status: Former Smoker    Quit date: 01/14/1985  . Smokeless tobacco: Never Used  . Alcohol use 0.0 oz/week     Comment: beer daily   . Drug use: No  . Sexual activity: Yes    Partners: Female   Other Topics Concern  . Not on file   Social History Narrative   Retired- He worked at [employer]   Divorced   One daughter who lives in [place]    Past Surgical History:  Procedure Laterality Date  . cataract extraction left eye    . COLONOSCOPY    . DUPUYTREN CONTRACTURE RELEASE Left   . rt knee arthroscopic      Family History  Problem Relation Age of Onset  . Stroke Mother   . Colon cancer Father   . Cancer Sister        breast     No Known Allergies  Current Outpatient Prescriptions on File Prior to Visit  Medication Sig Dispense Refill  . aspirin 81 MG tablet Take 81 mg by mouth daily.      . benazepril (LOTENSIN) 10 MG tablet TAKE 1 TABLET BY MOUTH DAILY 90 tablet 0  . Multiple Vitamin (MULTIVITAMIN) tablet Take 1 tablet by mouth daily.    . sildenafil (REVATIO)  20 MG tablet TAKE ONE TABLET (20 MG) BY MOUTH DAILY AS NEEDED. 30 tablet 2  . verapamil (CALAN-SR) 180 MG CR tablet TAKE 1 TABLET BY MOUTH AT BEDTIME 90 tablet 0   No current facility-administered medications on file prior to visit.     BP 140/70   Temp 98.1 F (36.7 C) (Oral)   Ht 5' 10.5" (1.791 m)   Wt 180 lb (81.6 kg)   BMI 25.46 kg/m       Objective:   Physical Exam  Constitutional: He is oriented to person, place, and time. He appears well-developed and well-nourished. No distress.  HENT:  Head: Normocephalic and atraumatic.  Right Ear: External ear normal.  Left Ear: External ear normal.  Nose: Nose normal.  Mouth/Throat: Oropharynx is  clear and moist. No oropharyngeal exudate.  Eyes: Pupils are equal, round, and reactive to light. Conjunctivae and EOM are normal. Right eye exhibits no discharge. Left eye exhibits no discharge. No scleral icterus.  Neck: Neck supple. No JVD present. No tracheal deviation present. No thyromegaly present.  Cardiovascular: Normal rate, regular rhythm, normal heart sounds and intact distal pulses.  Exam reveals no gallop and no friction rub.   No murmur heard. Pulmonary/Chest: Effort normal and breath sounds normal. No stridor. No respiratory distress. He has no wheezes. He has no rales. He exhibits no tenderness.  Abdominal: Soft. Bowel sounds are normal. He exhibits no distension and no mass. There is no tenderness. There is no rebound and no guarding.  Genitourinary: Prostate is enlarged. Prostate is not tender.  Musculoskeletal: Normal range of motion. He exhibits no edema, tenderness or deformity.  Lymphadenopathy:    He has no cervical adenopathy.  Neurological: He is alert and oriented to person, place, and time. He has normal reflexes. He displays normal reflexes. No cranial nerve deficit. He exhibits normal muscle tone. Coordination normal.  Skin: Skin is warm and dry. No rash noted. He is not diaphoretic. No erythema. No pallor.  Scattered cherry angiomas on torso   Psychiatric: He has a normal mood and affect. His behavior is normal. Judgment and thought content normal.  Nursing note and vitals reviewed.     Assessment & Plan:  1. Routine general medical examination at a health care facility - Follow up in one year or sooner if needed - Continue to eat healthy and exercise  - Basic metabolic panel - CBC with Differential/Platelet - Hepatic function panel - Lipid panel - PSA  2. Hyperlipidemia, unspecified hyperlipidemia type - consider statin  - Basic metabolic panel - CBC with Differential/Platelet - Hepatic function panel - Lipid panel - PSA  3. Essential  hypertension - Near goal. No change at this time  - Basic metabolic panel - CBC with Differential/Platelet - Hepatic function panel - Lipid panel - PSA  4. Need for influenza vaccination  - Flu vaccine HIGH DOSE PF (Fluzone High dose)  [name], NP

## 2017-01-31 ENCOUNTER — Other Ambulatory Visit: Payer: Self-pay | Admitting: Adult Health

## 2017-02-11 DIAGNOSIS — H269 Unspecified cataract: Secondary | ICD-10-CM | POA: Diagnosis not present

## 2017-02-11 DIAGNOSIS — N529 Male erectile dysfunction, unspecified: Secondary | ICD-10-CM | POA: Diagnosis not present

## 2017-02-11 DIAGNOSIS — Z87891 Personal history of nicotine dependence: Secondary | ICD-10-CM | POA: Diagnosis not present

## 2017-02-11 DIAGNOSIS — Z7982 Long term (current) use of aspirin: Secondary | ICD-10-CM | POA: Diagnosis not present

## 2017-02-11 DIAGNOSIS — Z8249 Family history of ischemic heart disease and other diseases of the circulatory system: Secondary | ICD-10-CM | POA: Diagnosis not present

## 2017-02-11 DIAGNOSIS — I10 Essential (primary) hypertension: Secondary | ICD-10-CM | POA: Diagnosis not present

## 2017-02-11 DIAGNOSIS — Z809 Family history of malignant neoplasm, unspecified: Secondary | ICD-10-CM | POA: Diagnosis not present

## 2017-02-11 DIAGNOSIS — Z803 Family history of malignant neoplasm of breast: Secondary | ICD-10-CM | POA: Diagnosis not present

## 2017-02-12 ENCOUNTER — Ambulatory Visit (INDEPENDENT_AMBULATORY_CARE_PROVIDER_SITE_OTHER): Payer: Medicare HMO | Admitting: Adult Health

## 2017-02-12 ENCOUNTER — Encounter: Payer: Self-pay | Admitting: Adult Health

## 2017-02-12 VITALS — BP 112/68 | HR 58 | Temp 98.1°F | Ht 70.5 in | Wt 182.2 lb

## 2017-02-12 DIAGNOSIS — I10 Essential (primary) hypertension: Secondary | ICD-10-CM

## 2017-02-12 NOTE — Progress Notes (Signed)
Subjective:    Patient ID: Noah King, male    DOB: 05-31-1945, 72 y.o.   MRN: [id number]  HPI   72 year old male who  has a past medical history of Cataract, Dupuytren's contracture, GERD (gastroesophageal reflux disease), Hiatal hernia, Hypertension, and Polycythemia.  He presents to the office today for blood pressure check. He reports that [employer] sent a home health MD out to his house for a Medicare Wellness visit and the physician got BP readings of 180/94 on multiple tries.   He does not monitor his BP at home but plans to start   Denies any symptoms of hypertension   Review of Systems See HPI   Past Medical History:  Diagnosis Date  . Cataract    lft extraction  . Dupuytren's contracture   . GERD (gastroesophageal reflux disease)   . Hiatal hernia   . Hypertension   . Polycythemia     Social History   Socioeconomic History  . Marital status: Single    Spouse name: Not on file  . Number of children: Not on file  . Years of education: Not on file  . Highest education level: Not on file  Social Needs  . Financial resource strain: Not on file  . Food insecurity - worry: Not on file  . Food insecurity - inability: Not on file  . Transportation needs - medical: Not on file  . Transportation needs - non-medical: Not on file  Occupational History  . Not on file  Tobacco Use  . Smoking status: Former Smoker    Last attempt to quit: 01/14/1985    Years since quitting: 32.1  . Smokeless tobacco: Never Used  Substance and Sexual Activity  . Alcohol use: Yes    Alcohol/week: 0.0 oz    Comment: beer daily   . Drug use: No  . Sexual activity: Yes    Partners: Female  Other Topics Concern  . Not on file  Social History Narrative   Retired- He worked at [employer]   Divorced   One daughter who lives in [place]    Past Surgical History:  Procedure Laterality Date  . cataract extraction left eye    . COLONOSCOPY    . DUPUYTREN CONTRACTURE RELEASE Left   . rt  knee arthroscopic      Family History  Problem Relation Age of Onset  . Stroke Mother   . Colon cancer Father   . Cancer Sister        breast     No Known Allergies  Current Outpatient Medications on File Prior to Visit  Medication Sig Dispense Refill  . aspirin 81 MG tablet Take 81 mg by mouth daily.      . benazepril (LOTENSIN) 10 MG tablet TAKE 1 TABLET BY MOUTH DAILY 90 tablet 1  . Multiple Vitamin (MULTIVITAMIN) tablet Take 1 tablet by mouth daily.    . sildenafil (REVATIO) 20 MG tablet TAKE ONE TABLET (20 MG) BY MOUTH DAILY AS NEEDED. 30 tablet 2  . verapamil (CALAN-SR) 180 MG CR tablet TAKE 1 TABLET BY MOUTH AT BEDTIME 90 tablet 1   No current facility-administered medications on file prior to visit.     BP 112/68 (BP Location: Right Arm, Patient Position: Sitting, Cuff Size: Large)   Pulse (!) 58   Temp 98.1 F (36.7 C) (Oral)   Ht 5' 10.5" (1.791 m)   Wt 182 lb 3.2 oz (82.6 kg)   SpO2 100%   BMI 25.77  kg/m       Objective:   Physical Exam  Constitutional: He is oriented to person, place, and time. He appears well-developed and well-nourished. No distress.  Cardiovascular: Normal rate, regular rhythm, normal heart sounds and intact distal pulses. Exam reveals no gallop and no friction rub.  No murmur heard. Pulmonary/Chest: Effort normal and breath sounds normal. No respiratory distress. He has no wheezes. He has no rales. He exhibits no tenderness.  Neurological: He is alert and oriented to person, place, and time.  Skin: Skin is warm and dry. No rash noted. He is not diaphoretic. No erythema. No pallor.  Psychiatric: He has a normal mood and affect. His behavior is normal. Judgment and thought content normal.  Vitals reviewed.     Assessment & Plan:  1. Essential hypertension - Has been well controlled in the past on his current regimen. He takes his medication as prescribed  - Today in the office BP 112/68 bilaterally.  - Will have him monitor at home  and report back readings   [name], NP

## 2017-03-05 DIAGNOSIS — R69 Illness, unspecified: Secondary | ICD-10-CM | POA: Diagnosis not present

## 2017-05-04 ENCOUNTER — Other Ambulatory Visit: Payer: Self-pay | Admitting: Adult Health

## 2017-05-06 NOTE — Telephone Encounter (Signed)
Sent to the pharmacy by e-scribe. 

## 2017-05-09 ENCOUNTER — Telehealth: Payer: Self-pay | Admitting: *Deleted

## 2017-05-09 NOTE — Telephone Encounter (Signed)
Prior auth for Sildenafil 100mg  sent to [id number].

## 2017-05-12 NOTE — Telephone Encounter (Signed)
Fax received from [facility] stating the request was denied and sent to [facility] asst.

## 2017-05-13 NOTE — Telephone Encounter (Signed)
Left a message for a return call.

## 2017-05-13 NOTE — Telephone Encounter (Signed)
Pt returning call. Please call cell [phone]. Home phone is not working properly.

## 2017-05-13 NOTE — Telephone Encounter (Signed)
Spoke to the pt and informed him that sildenafil has been denied through [facility].  Advised pt to call to see if any medication will be covered.  Pt to call back if needed.  No further action required.

## 2017-07-29 ENCOUNTER — Other Ambulatory Visit: Payer: Self-pay | Admitting: Adult Health

## 2017-07-29 NOTE — Telephone Encounter (Signed)
Sent to the pharmacy by e-scribe.  Pt has cpx scheduled on 11/13/17.

## 2017-07-30 ENCOUNTER — Other Ambulatory Visit: Payer: Self-pay | Admitting: Adult Health

## 2017-07-31 ENCOUNTER — Other Ambulatory Visit: Payer: Self-pay | Admitting: Adult Health

## 2017-07-31 NOTE — Telephone Encounter (Signed)
Sent to the pharmacy by e-scribe.  Pt is scheduled for cpx on 11/13/17.

## 2017-07-31 NOTE — Telephone Encounter (Signed)
Filled earlier this morning.  Duplicate request.

## 2017-09-02 DIAGNOSIS — R69 Illness, unspecified: Secondary | ICD-10-CM | POA: Diagnosis not present

## 2017-10-03 ENCOUNTER — Other Ambulatory Visit: Payer: Self-pay | Admitting: Adult Health

## 2017-11-13 ENCOUNTER — Ambulatory Visit (INDEPENDENT_AMBULATORY_CARE_PROVIDER_SITE_OTHER): Payer: Medicare HMO | Admitting: Adult Health

## 2017-11-13 ENCOUNTER — Encounter: Payer: Self-pay | Admitting: Adult Health

## 2017-11-13 VITALS — BP 120/68 | HR 64 | Temp 98.1°F | Ht 70.5 in | Wt 178.7 lb

## 2017-11-13 DIAGNOSIS — Z1331 Encounter for screening for depression: Secondary | ICD-10-CM

## 2017-11-13 DIAGNOSIS — Z0000 Encounter for general adult medical examination without abnormal findings: Secondary | ICD-10-CM

## 2017-11-13 DIAGNOSIS — Z125 Encounter for screening for malignant neoplasm of prostate: Secondary | ICD-10-CM | POA: Diagnosis not present

## 2017-11-13 DIAGNOSIS — E785 Hyperlipidemia, unspecified: Secondary | ICD-10-CM | POA: Diagnosis not present

## 2017-11-13 DIAGNOSIS — I10 Essential (primary) hypertension: Secondary | ICD-10-CM | POA: Diagnosis not present

## 2017-11-13 DIAGNOSIS — Z23 Encounter for immunization: Secondary | ICD-10-CM | POA: Diagnosis not present

## 2017-11-13 LAB — CBC WITH DIFFERENTIAL/PLATELET
BASOS PCT: 0.9 % (ref 0.0–3.0)
Basophils Absolute: 0.1 10*3/uL (ref 0.0–0.1)
Eosinophils Absolute: 0.5 10*3/uL (ref 0.0–0.7)
Eosinophils Relative: 7.9 % — ABNORMAL HIGH (ref 0.0–5.0)
HEMATOCRIT: 42.9 % (ref 39.0–52.0)
Hemoglobin: 14.1 g/dL (ref 13.0–17.0)
LYMPHS PCT: 17.4 % (ref 12.0–46.0)
Lymphs Abs: 1 10*3/uL (ref 0.7–4.0)
MCHC: 33 g/dL (ref 30.0–36.0)
MCV: 81.1 fl (ref 78.0–100.0)
MONOS PCT: 11.4 % (ref 3.0–12.0)
Monocytes Absolute: 0.7 10*3/uL (ref 0.1–1.0)
NEUTROS ABS: 3.6 10*3/uL (ref 1.4–7.7)
Neutrophils Relative %: 62.4 % (ref 43.0–77.0)
PLATELETS: 188 10*3/uL (ref 150.0–400.0)
RBC: 5.29 Mil/uL (ref 4.22–5.81)
RDW: 16.4 % — AB (ref 11.5–15.5)
WBC: 5.9 10*3/uL (ref 4.0–10.5)

## 2017-11-13 LAB — HEPATIC FUNCTION PANEL
ALBUMIN: 4.3 g/dL (ref 3.5–5.2)
ALT: 23 U/L (ref 0–53)
AST: 19 U/L (ref 0–37)
Alkaline Phosphatase: 42 U/L (ref 39–117)
Bilirubin, Direct: 0.2 mg/dL (ref 0.0–0.3)
TOTAL PROTEIN: 6.9 g/dL (ref 6.0–8.3)
Total Bilirubin: 1.1 mg/dL (ref 0.2–1.2)

## 2017-11-13 LAB — LIPID PANEL
CHOLESTEROL: 156 mg/dL (ref 0–200)
HDL: 39.1 mg/dL (ref >39.00)
LDL Cholesterol: 96 mg/dL (ref 0–99)
NonHDL: 117.36
TRIGLYCERIDES: 106 mg/dL (ref 0.0–149.0)
Total CHOL/HDL Ratio: 4
VLDL: 21.2 mg/dL (ref 0.0–40.0)

## 2017-11-13 LAB — BASIC METABOLIC PANEL
BUN: 8 mg/dL (ref 6–23)
CHLORIDE: 103 meq/L (ref 96–112)
CO2: 31 meq/L (ref 19–32)
CREATININE: 0.85 mg/dL (ref 0.40–1.50)
Calcium: 9.4 mg/dL (ref 8.4–10.5)
GFR: 93.89 mL/min (ref >60.00)
GLUCOSE: 94 mg/dL (ref 70–99)
Potassium: 4.5 mEq/L (ref 3.5–5.1)
SODIUM: 140 meq/L (ref 135–145)

## 2017-11-13 LAB — TSH: TSH: 1.46 u[IU]/mL (ref 0.35–4.50)

## 2017-11-13 LAB — PSA: PSA: 2.04 ng/mL (ref 0.10–4.00)

## 2017-11-13 MED ORDER — VERAPAMIL HCL ER 180 MG PO TBCR: 180.0000 mg | EXTENDED_RELEASE_TABLET | Freq: Every day | ORAL | 3 refills | Status: DC

## 2017-11-13 NOTE — Progress Notes (Signed)
Subjective:    Patient ID: Noah King, male    DOB: December 03, 1945, 72 y.o.   MRN: [id number]  HPI  Patient presents for yearly preventative medicine examination. He is a pleasant 72 year old male who  has a past medical history of Cataract, Dupuytren's contracture, GERD (gastroesophageal reflux disease), Hiatal hernia, Hypertension, and Polycythemia.  Essential Hypertension - controlled with Lotensin and Verapamil. He denies dizziness, lightheadedness, headaches, blurred vision, chest pain, or shortness of breath. He reports a chronic dry cough for " a long time". He has been using Mucinex but this only works for a few hours.  BP Readings from Last 3 Encounters:  11/13/17 120/68  02/12/17 112/68  11/12/16 140/70   Hyperlipidemia - diet controlled in the past.  Lab Results  Component Value Date   CHOL 178 11/12/2016   HDL 48.40 11/12/2016   LDLCALC 114 (H) 11/12/2016   TRIG 77.0 11/12/2016   CHOLHDL 4 11/12/2016   All immunizations and health maintenance protocols were reviewed with the patient and needed orders were placed. Needs seasonal flu   Appropriate screening laboratory values were ordered for the patient including screening of hyperlipidemia, renal function and hepatic function. If indicated by BPH, a PSA was ordered.  Medication reconciliation,  past medical history, social history, problem list and allergies were reviewed in detail with the patient  Goals were established with regard to weight loss, exercise, and  diet in compliance with medications  End of life planning was discussed.  He is up to date on routine colonoscopy, dental and vision exams.   Review of Systems  Constitutional: Negative.   HENT: Negative.   Eyes: Negative.   Respiratory: Negative.   Cardiovascular: Negative.   Gastrointestinal: Negative.   Endocrine: Negative.   Genitourinary: Negative.   Musculoskeletal: Negative.   Skin: Negative.   Allergic/Immunologic: Negative.     Neurological: Negative.   Hematological: Negative.   Psychiatric/Behavioral: Negative.   All other systems reviewed and are negative.  Past Medical History:  Diagnosis Date  . Cataract    lft extraction  . Dupuytren's contracture   . GERD (gastroesophageal reflux disease)   . Hiatal hernia   . Hypertension   . Polycythemia     Social History   Socioeconomic History  . Marital status: Single    Spouse name: Not on file  . Number of children: Not on file  . Years of education: Not on file  . Highest education level: Not on file  Occupational History  . Not on file  Social Needs  . Financial resource strain: Not on file  . Food insecurity:    Worry: Not on file    Inability: Not on file  . Transportation needs:    Medical: Not on file    Non-medical: Not on file  Tobacco Use  . Smoking status: Former Smoker    Last attempt to quit: 01/14/1985    Years since quitting: 32.8  . Smokeless tobacco: Never Used  Substance and Sexual Activity  . Alcohol use: Yes    Alcohol/week: 0.0 standard drinks    Comment: beer daily   . Drug use: No  . Sexual activity: Yes    Partners: Female  Lifestyle  . Physical activity:    Days per week: Not on file    Minutes per session: Not on file  . Stress: Not on file  Relationships  . Social connections:    Talks on phone: Not on file    Gets  together: Not on file    Attends religious service: Not on file    Active member of club or organization: Not on file    Attends meetings of clubs or organizations: Not on file    Relationship status: Not on file  . Intimate partner violence:    Fear of current or ex partner: Not on file    Emotionally abused: Not on file    Physically abused: Not on file    Forced sexual activity: Not on file  Other Topics Concern  . Not on file  Social History Narrative   Retired- He worked at [employer]   Divorced   One daughter who lives in [place]    Past Surgical History:  Procedure Laterality Date   . cataract extraction left eye    . COLONOSCOPY    . DUPUYTREN CONTRACTURE RELEASE Left   . rt knee arthroscopic      Family History  Problem Relation Age of Onset  . Stroke Mother   . Colon cancer Father   . Cancer Sister        breast     No Known Allergies  Current Outpatient Medications on File Prior to Visit  Medication Sig Dispense Refill  . aspirin 81 MG tablet Take 81 mg by mouth daily.      . benazepril (LOTENSIN) 10 MG tablet TAKE 1 TABLET BY MOUTH DAILY 90 tablet 1  . Multiple Vitamin (MULTIVITAMIN) tablet Take 1 tablet by mouth daily.    . sildenafil (REVATIO) 20 MG tablet TAKE ONE TABLET BY MOUTH DAILY AS NEEDED 30 tablet 0  . verapamil (CALAN-SR) 180 MG CR tablet TAKE ONE TABLET BY MOUTH AT BEDTIME 90 tablet 1   No current facility-administered medications on file prior to visit.     BP 120/68 (BP Location: Left Arm, Patient Position: Sitting, Cuff Size: Normal)   Pulse 64   Temp 98.1 F (36.7 C) (Oral)   Ht 5' 10.5" (1.791 m)   Wt 178 lb 11.2 oz (81.1 kg)   BMI 25.28 kg/m       Objective:   Physical Exam  Constitutional: He is oriented to person, place, and time. He appears well-developed and well-nourished. No distress.  HENT:  Head: Normocephalic and atraumatic.  Right Ear: External ear normal.  Left Ear: External ear normal.  Nose: Nose normal.  Mouth/Throat: Oropharynx is clear and moist. No oropharyngeal exudate.  Eyes: Pupils are equal, round, and reactive to light. Conjunctivae and EOM are normal. Right eye exhibits no discharge. Left eye exhibits no discharge. No scleral icterus.  Neck: Normal range of motion. Neck supple. No JVD present. No tracheal deviation present. No thyromegaly present.  Cardiovascular: Normal rate, regular rhythm, normal heart sounds and intact distal pulses. Exam reveals no gallop and no friction rub.  No murmur heard. Pulmonary/Chest: Effort normal and breath sounds normal. No stridor. No respiratory distress. He  has no wheezes. He has no rales. He exhibits no tenderness.  Abdominal: Soft. Bowel sounds are normal. He exhibits no distension and no mass. There is no tenderness. There is no rebound and no guarding. No hernia.  Genitourinary:  Genitourinary Comments: Will do PSA   Musculoskeletal: Normal range of motion. He exhibits no edema, tenderness or deformity.  Lymphadenopathy:    He has no cervical adenopathy.  Neurological: He is alert and oriented to person, place, and time. He displays normal reflexes. No cranial nerve deficit or sensory deficit. He exhibits normal muscle tone. Coordination normal.  Skin: Skin is warm and dry. Capillary refill takes less than 2 seconds. No rash noted. He is not diaphoretic. No erythema. No pallor.  Cherry angiomas on torso. Vitiligo on bilateral hands    Psychiatric: He has a normal mood and affect. His behavior is normal. Judgment and thought content normal.  Nursing note and vitals reviewed.     Assessment & Plan:  1. Routine general medical examination at a health care facility - Benign exam  - Follow up in one year or sooner if needed - Basic metabolic panel - CBC with Differential/Platelet - Hepatic function panel - Lipid panel - TSH  2. Essential hypertension - Will have him stop Lotensin to see if this is causative factor of cough. Monitor BP at home and follow up in 2 weeks or sooner if needed - Return precautions given  - Basic metabolic panel - CBC with Differential/Platelet - Hepatic function panel - Lipid panel - TSH - verapamil (CALAN-SR) 180 MG CR tablet; Take 1 tablet (180 mg total) by mouth at bedtime.  Dispense: 90 tablet; Refill: 3  3. Hyperlipidemia, unspecified hyperlipidemia type - Consider statin  - Basic metabolic panel - CBC with Differential/Platelet - Hepatic function panel - Lipid panel - TSH  4. Prostate cancer screening  - PSA  5. Screening for depression - negative   6. Need for influenza vaccination  -  Flu vaccine HIGH DOSE PF (Fluzone High dose)  [name], NP

## 2017-11-13 NOTE — Patient Instructions (Signed)
It was great seeing you today   I will follow up with you regarding your blood work   I am going to have you stop Lotensin and see if this resolves your dry cough. Please monitor BP at home and follow up with me in 2 weeks

## 2017-11-19 ENCOUNTER — Encounter: Payer: Self-pay | Admitting: Adult Health

## 2017-11-27 ENCOUNTER — Encounter: Payer: Self-pay | Admitting: Adult Health

## 2017-11-27 ENCOUNTER — Ambulatory Visit (INDEPENDENT_AMBULATORY_CARE_PROVIDER_SITE_OTHER): Payer: Medicare HMO | Admitting: Adult Health

## 2017-11-27 VITALS — BP 150/72 | Temp 98.0°F | Wt 181.0 lb

## 2017-11-27 DIAGNOSIS — I10 Essential (primary) hypertension: Secondary | ICD-10-CM | POA: Diagnosis not present

## 2017-11-27 MED ORDER — VERAPAMIL HCL ER 240 MG PO TBCR: 240.0000 mg | EXTENDED_RELEASE_TABLET | Freq: Every day | ORAL | 0 refills | Status: DC

## 2017-11-27 NOTE — Progress Notes (Signed)
Subjective:    Patient ID: PASCAL STIGGERS, male    DOB: 09/08/1945, 72 y.o.   MRN: [id number]  HPI  72 year old male who presents to the office today for 2-week follow-up regarding hypertension.  During his last visit complaining of a chronic dry cough.  It was thought that this could be due to Lotensin and he was advised to stop this medication continued on his current dose of verapamil. He proceeded to send me his blood pressure readings for the week after he stopped taking Lotensin his blood pressures were consistently in the 150s over 70s in the 80s.  He reported that his cough had improved.  I then advised him to increase Verapimil to 180 mg twice daily and follow-up in the office.  Today in the office he reports that since increasing to to BID, his BP has not varied much, he still has BO readings consistently in the 140-150's at home.   He denies headaches, blurred vision, or dizziness.   His dry cough is about 85% better.   BP Readings from Last 3 Encounters:  11/27/17 (!) 150/72  11/13/17 120/68  02/12/17 112/68     Review of Systems See HPI   Past Medical History:  Diagnosis Date  . Cataract    lft extraction  . Dupuytren's contracture   . GERD (gastroesophageal reflux disease)   . Hiatal hernia   . Hypertension   . Polycythemia     Social History   Socioeconomic History  . Marital status: Single    Spouse name: Not on file  . Number of children: Not on file  . Years of education: Not on file  . Highest education level: Not on file  Occupational History  . Not on file  Social Needs  . Financial resource strain: Not on file  . Food insecurity:    Worry: Not on file    Inability: Not on file  . Transportation needs:    Medical: Not on file    Non-medical: Not on file  Tobacco Use  . Smoking status: Former Smoker    Last attempt to quit: 01/14/1985    Years since quitting: 32.8  . Smokeless tobacco: Never Used  Substance and Sexual Activity  .  Alcohol use: Yes    Alcohol/week: 0.0 standard drinks    Comment: beer daily   . Drug use: No  . Sexual activity: Yes    Partners: Female  Lifestyle  . Physical activity:    Days per week: Not on file    Minutes per session: Not on file  . Stress: Not on file  Relationships  . Social connections:    Talks on phone: Not on file    Gets together: Not on file    Attends religious service: Not on file    Active member of club or organization: Not on file    Attends meetings of clubs or organizations: Not on file    Relationship status: Not on file  . Intimate partner violence:    Fear of current or ex partner: Not on file    Emotionally abused: Not on file    Physically abused: Not on file    Forced sexual activity: Not on file  Other Topics Concern  . Not on file  Social History Narrative   Retired- He worked at [employer]   Divorced   One daughter who lives in [place]    Past Surgical History:  Procedure Laterality Date  . cataract extraction left  eye    . COLONOSCOPY    . DUPUYTREN CONTRACTURE RELEASE Left   . rt knee arthroscopic      Family History  Problem Relation Age of Onset  . Stroke Mother   . Colon cancer Father   . Cancer Sister        breast     Allergies  Allergen Reactions  . Lotensin [Benazepril Hcl] Cough    Current Outpatient Medications on File Prior to Visit  Medication Sig Dispense Refill  . aspirin 81 MG tablet Take 81 mg by mouth daily.      . Multiple Vitamin (MULTIVITAMIN) tablet Take 1 tablet by mouth daily.    . sildenafil (REVATIO) 20 MG tablet TAKE ONE TABLET BY MOUTH DAILY AS NEEDED 30 tablet 0  . verapamil (CALAN-SR) 180 MG CR tablet Take 1 tablet (180 mg total) by mouth at bedtime. 90 tablet 3   No current facility-administered medications on file prior to visit.     BP (!) 150/72   Temp 98 F (36.7 C)   Wt 181 lb (82.1 kg)   BMI 25.60 kg/m       Objective:   Physical Exam  Constitutional: He is oriented to person,  place, and time. He appears well-developed and well-nourished. No distress.  Eyes: Right eye exhibits no discharge. Left eye exhibits no discharge. No scleral icterus.  Cardiovascular: Normal rate, regular rhythm, normal heart sounds and intact distal pulses.  Pulmonary/Chest: Effort normal and breath sounds normal.  Neurological: He is alert and oriented to person, place, and time.  Skin: Skin is warm and dry. Capillary refill takes less than 2 seconds. He is not diaphoretic.  Psychiatric: He has a normal mood and affect. His behavior is normal. Judgment and thought content normal.  Nursing note and vitals reviewed.     Assessment & Plan:  1. Essential hypertension - Not at goal and his cough has almost resolved. Will increase Verapamil to 240 mg in the morning and 180 mg in the evening.  - He will send me his BP results via mychart in two weeks  - verapamil (CALAN-SR) 240 MG CR tablet; Take 1 tablet (240 mg total) by mouth daily.  Dispense: 90 tablet; Refill: 0   [name], NP

## 2017-12-06 ENCOUNTER — Encounter: Payer: Self-pay | Admitting: Adult Health

## 2017-12-08 ENCOUNTER — Encounter: Payer: Self-pay | Admitting: Adult Health

## 2018-01-26 ENCOUNTER — Other Ambulatory Visit: Payer: Self-pay | Admitting: Adult Health

## 2018-01-27 ENCOUNTER — Encounter: Payer: Self-pay | Admitting: Adult Health

## 2018-01-27 NOTE — Telephone Encounter (Signed)
Per [name], d/c verapamil 240 mg and gill benazepril.  Sent to the pharmacy by e-scribe.

## 2018-02-26 ENCOUNTER — Telehealth: Payer: Self-pay | Admitting: Adult Health

## 2018-02-26 NOTE — Telephone Encounter (Signed)
Copied from [place] [id number]. Topic: General - Other >> Feb 26, 2018 10:09 AM Lennox Solders wrote: Reason for CRM: kelly pharmacist is calling to clarify whether the pt should be on verapamil 240 mg or 180 mg. Kristopher Oppenheim [phone]

## 2018-02-26 NOTE — Telephone Encounter (Signed)
Noah King, spoke to the pharmacist and informed her that per last OV note, pt should be on 240 and 180.  Claiborne [place]ings asked that I double check with you as both medications are extended release.  Please advise.  Thanks!!

## 2018-02-27 ENCOUNTER — Other Ambulatory Visit: Payer: Self-pay | Admitting: Family Medicine

## 2018-02-27 DIAGNOSIS — I10 Essential (primary) hypertension: Secondary | ICD-10-CM

## 2018-02-27 MED ORDER — VERAPAMIL HCL ER 240 MG PO TBCR: EXTENDED_RELEASE_TABLET | ORAL | 2 refills | Status: DC

## 2018-02-27 MED ORDER — VERAPAMIL HCL ER 180 MG PO TBCR: EXTENDED_RELEASE_TABLET | ORAL | 2 refills | Status: DC

## 2018-02-27 NOTE — Telephone Encounter (Signed)
He can take them both in the morning

## 2018-02-27 NOTE — Telephone Encounter (Signed)
Prescriptions sent to the pharmacy along with new directions.

## 2018-03-05 DIAGNOSIS — R69 Illness, unspecified: Secondary | ICD-10-CM | POA: Diagnosis not present

## 2018-03-25 DIAGNOSIS — H353131 Nonexudative age-related macular degeneration, bilateral, early dry stage: Secondary | ICD-10-CM | POA: Diagnosis not present

## 2018-03-30 DIAGNOSIS — R69 Illness, unspecified: Secondary | ICD-10-CM | POA: Diagnosis not present

## 2018-09-07 DIAGNOSIS — R69 Illness, unspecified: Secondary | ICD-10-CM | POA: Diagnosis not present

## 2018-09-10 DIAGNOSIS — R69 Illness, unspecified: Secondary | ICD-10-CM | POA: Diagnosis not present

## 2018-10-23 ENCOUNTER — Other Ambulatory Visit: Payer: Self-pay | Admitting: Adult Health

## 2018-10-23 NOTE — Telephone Encounter (Signed)
Sent to the pharmacy by e-scribe.  Pt has upcoming cpx. 

## 2018-10-30 DIAGNOSIS — K219 Gastro-esophageal reflux disease without esophagitis: Secondary | ICD-10-CM | POA: Diagnosis not present

## 2018-10-30 DIAGNOSIS — N529 Male erectile dysfunction, unspecified: Secondary | ICD-10-CM | POA: Diagnosis not present

## 2018-10-30 DIAGNOSIS — M1990 Unspecified osteoarthritis, unspecified site: Secondary | ICD-10-CM | POA: Diagnosis not present

## 2018-10-30 DIAGNOSIS — G8929 Other chronic pain: Secondary | ICD-10-CM | POA: Diagnosis not present

## 2018-10-30 DIAGNOSIS — Z7982 Long term (current) use of aspirin: Secondary | ICD-10-CM | POA: Diagnosis not present

## 2018-10-30 DIAGNOSIS — G629 Polyneuropathy, unspecified: Secondary | ICD-10-CM | POA: Diagnosis not present

## 2018-10-30 DIAGNOSIS — Z008 Encounter for other general examination: Secondary | ICD-10-CM | POA: Diagnosis not present

## 2018-10-30 DIAGNOSIS — R269 Unspecified abnormalities of gait and mobility: Secondary | ICD-10-CM | POA: Diagnosis not present

## 2018-10-30 DIAGNOSIS — H269 Unspecified cataract: Secondary | ICD-10-CM | POA: Diagnosis not present

## 2018-10-30 DIAGNOSIS — I10 Essential (primary) hypertension: Secondary | ICD-10-CM | POA: Diagnosis not present

## 2018-10-30 DIAGNOSIS — Z7722 Contact with and (suspected) exposure to environmental tobacco smoke (acute) (chronic): Secondary | ICD-10-CM | POA: Diagnosis not present

## 2018-11-13 ENCOUNTER — Telehealth: Payer: Self-pay | Admitting: Adult Health

## 2018-11-13 ENCOUNTER — Telehealth: Payer: Self-pay

## 2018-11-13 NOTE — Telephone Encounter (Signed)
He can have the 7 am slot on 11/18. That is the only time I can work him in. If he can't make that he will have to wait until they can get him scheduled which is probably after the first of the year

## 2018-11-13 NOTE — Telephone Encounter (Signed)
Copied from [place] [id number]. Topic: Appointment Scheduling - Scheduling Inquiry for Clinic >> Nov 13, 2018 11:48 AM Scherrie Gerlach wrote: Reason for CRM:  pt states he needs cpe for medicare purposes this year. He says medicare gets upset if he does not have 1 a yr. Would like to know if cory will work him in for an early am appt anytime after 11/18 and before 12/24/18.  Pt had cpe on 11/10, but must be resch

## 2018-11-13 NOTE — Telephone Encounter (Signed)
LMVM to reschedule appointment. [name] will be out of the office November 5th, 6th, 10th and 11th.  His next available physical slot is not till February

## 2018-11-13 NOTE — Telephone Encounter (Signed)
Left a message for a return call.

## 2018-11-13 NOTE — Telephone Encounter (Signed)
Pt called in and was scheduled for 11/18, 7am.

## 2018-11-24 ENCOUNTER — Encounter: Payer: Medicare HMO | Admitting: Adult Health

## 2018-12-02 ENCOUNTER — Encounter: Payer: Self-pay | Admitting: Adult Health

## 2018-12-02 ENCOUNTER — Ambulatory Visit (INDEPENDENT_AMBULATORY_CARE_PROVIDER_SITE_OTHER): Payer: Medicare HMO | Admitting: Adult Health

## 2018-12-02 ENCOUNTER — Other Ambulatory Visit: Payer: Self-pay

## 2018-12-02 VITALS — BP 156/82 | Temp 97.2°F | Ht 70.25 in | Wt 179.0 lb

## 2018-12-02 DIAGNOSIS — N529 Male erectile dysfunction, unspecified: Secondary | ICD-10-CM

## 2018-12-02 DIAGNOSIS — Z23 Encounter for immunization: Secondary | ICD-10-CM

## 2018-12-02 DIAGNOSIS — Z125 Encounter for screening for malignant neoplasm of prostate: Secondary | ICD-10-CM

## 2018-12-02 DIAGNOSIS — I10 Essential (primary) hypertension: Secondary | ICD-10-CM

## 2018-12-02 DIAGNOSIS — Z0000 Encounter for general adult medical examination without abnormal findings: Secondary | ICD-10-CM | POA: Diagnosis not present

## 2018-12-02 LAB — CBC WITH DIFFERENTIAL/PLATELET
Basophils Absolute: 0.1 10*3/uL (ref 0.0–0.1)
Basophils Relative: 1 % (ref 0.0–3.0)
Eosinophils Absolute: 0.4 10*3/uL (ref 0.0–0.7)
Eosinophils Relative: 6.6 % — ABNORMAL HIGH (ref 0.0–5.0)
HCT: 41.7 % (ref 39.0–52.0)
Hemoglobin: 13.2 g/dL (ref 13.0–17.0)
Lymphocytes Relative: 18.9 % (ref 12.0–46.0)
Lymphs Abs: 1.2 10*3/uL (ref 0.7–4.0)
MCHC: 31.8 g/dL (ref 30.0–36.0)
MCV: 79.4 fl (ref 78.0–100.0)
Monocytes Absolute: 0.9 10*3/uL (ref 0.1–1.0)
Monocytes Relative: 14.1 % — ABNORMAL HIGH (ref 3.0–12.0)
Neutro Abs: 3.6 10*3/uL (ref 1.4–7.7)
Neutrophils Relative %: 59.4 % (ref 43.0–77.0)
Platelets: 194 10*3/uL (ref 150.0–400.0)
RBC: 5.25 Mil/uL (ref 4.22–5.81)
RDW: 17.2 % — ABNORMAL HIGH (ref 11.5–15.5)
WBC: 6.1 10*3/uL (ref 4.0–10.5)

## 2018-12-02 LAB — LIPID PANEL
Cholesterol: 161 mg/dL (ref 0–200)
HDL: 51.7 mg/dL (ref >39.00)
LDL Cholesterol: 91 mg/dL (ref 0–99)
NonHDL: 108.99
Total CHOL/HDL Ratio: 3
Triglycerides: 88 mg/dL (ref 0.0–149.0)
VLDL: 17.6 mg/dL (ref 0.0–40.0)

## 2018-12-02 LAB — COMPREHENSIVE METABOLIC PANEL
ALT: 35 U/L (ref 0–53)
AST: 31 U/L (ref 0–37)
Albumin: 4.4 g/dL (ref 3.5–5.2)
Alkaline Phosphatase: 45 U/L (ref 39–117)
BUN: 9 mg/dL (ref 6–23)
CO2: 28 mEq/L (ref 19–32)
Calcium: 9.5 mg/dL (ref 8.4–10.5)
Chloride: 103 mEq/L (ref 96–112)
Creatinine, Ser: 0.81 mg/dL (ref 0.40–1.50)
GFR: 93.12 mL/min (ref >60.00)
Glucose, Bld: 112 mg/dL — ABNORMAL HIGH (ref 70–99)
Potassium: 4.1 mEq/L (ref 3.5–5.1)
Sodium: 140 mEq/L (ref 135–145)
Total Bilirubin: 0.8 mg/dL (ref 0.2–1.2)
Total Protein: 7.1 g/dL (ref 6.0–8.3)

## 2018-12-02 LAB — TSH: TSH: 1.75 u[IU]/mL (ref 0.35–4.50)

## 2018-12-02 LAB — PSA: PSA: 1.95 ng/mL (ref 0.10–4.00)

## 2018-12-02 MED ORDER — VERAPAMIL HCL ER 180 MG PO TBCR: EXTENDED_RELEASE_TABLET | ORAL | 3 refills | Status: DC

## 2018-12-02 MED ORDER — SILDENAFIL CITRATE 20 MG PO TABS: 20.0000 mg | ORAL_TABLET | Freq: Every day | ORAL | 3 refills | Status: DC | PRN

## 2018-12-02 NOTE — Patient Instructions (Signed)
It was great seeing you this morning   We will follow up with you regarding your blood work   Continue to stay active and do the exercises that PT gave you.   Please let me know if you need anything

## 2018-12-02 NOTE — Progress Notes (Signed)
Subjective:    Patient ID: Noah King, male    DOB: [birth year], 73 y.o.   MRN: [id number]  HPI Patient presents for yearly preventative medicine examination. He is a pleasant 73 year old male who  has a past medical history of Cataract, Dupuytren's contracture, GERD (gastroesophageal reflux disease), Hiatal hernia, Hypertension, and Polycythemia.  Essential Hypertension -currently prescribed Lotensin 10 mg daily and verapamil 180  mg daily.  He denieslightheadedness, chest pain, shortness of breath or syncopal episodes.  He does endorse some intermittent episodic dizziness at times when he changes positions too quickly.   He checks his BP once a week at home and reports readings of 125-130/80.  BP Readings from Last 3 Encounters:  12/02/18 (!) 156/82  11/27/17 (!) 150/72  11/13/17 120/68   ED - uses viagra as needed  H/o hyperlipidemia - now diet controlled.  Lab Results  Component Value Date   CHOL 156 11/13/2017   HDL 39.10 11/13/2017   LDLCALC 96 11/13/2017   TRIG 106.0 11/13/2017   CHOLHDL 4 11/13/2017    All immunizations and health maintenance protocols were reviewed with the patient and needed orders were placed. He is due for yearly flu vaccinations   Appropriate screening laboratory values were ordered for the patient including screening of hyperlipidemia, renal function and hepatic function. If indicated by BPH, a PSA was ordered.  Medication reconciliation,  past medical history, social history, problem list and allergies were reviewed in detail with the patient  Goals were established with regard to weight loss, exercise, and  diet in compliance with medications Wt Readings from Last 3 Encounters:  12/02/18 179 lb (81.2 kg)  11/27/17 181 lb (82.1 kg)  11/13/17 178 lb 11.2 oz (81.1 kg)    End of life planning was discussed.  He is up-to-date on routine screening colonoscopy as well as dental and vision exams.  Review of Systems  Constitutional:  Negative.   HENT: Negative.   Eyes: Negative.   Respiratory: Negative.   Cardiovascular: Negative.   Gastrointestinal: Negative.   Endocrine: Negative.   Genitourinary: Negative.   Musculoskeletal: Negative.   Skin: Negative.   Allergic/Immunologic: Negative.   Neurological: Positive for dizziness.  Hematological: Negative.   Psychiatric/Behavioral: Negative.   All other systems reviewed and are negative.  Past Medical History:  Diagnosis Date   Cataract    lft extraction   Dupuytren's contracture    GERD (gastroesophageal reflux disease)    Hiatal hernia    Hypertension    Polycythemia     Social History   Socioeconomic History   Marital status: Single    Spouse name: Not on file   Number of children: Not on file   Years of education: Not on file   Highest education level: Not on file  Occupational History   Not on file  Social Needs   Financial resource strain: Not on file   Food insecurity    Worry: Not on file    Inability: Not on file   Transportation needs    Medical: Not on file    Non-medical: Not on file  Tobacco Use   Smoking status: Former Smoker    Quit date: 01/14/1985    Years since quitting: 33.9   Smokeless tobacco: Never Used  Substance and Sexual Activity   Alcohol use: Yes    Alcohol/week: 0.0 standard drinks    Comment: beer daily    Drug use: No   Sexual activity: Yes    Partners:  Female  Lifestyle   Physical activity    Days per week: Not on file    Minutes per session: Not on file   Stress: Not on file  Relationships   Social connections    Talks on phone: Not on file    Gets together: Not on file    Attends religious service: Not on file    Active member of club or organization: Not on file    Attends meetings of clubs or organizations: Not on file    Relationship status: Not on file   Intimate partner violence    Fear of current or ex partner: Not on file    Emotionally abused: Not on file     Physically abused: Not on file    Forced sexual activity: Not on file  Other Topics Concern   Not on file  Social History Narrative   Retired- He worked at [employer]   Divorced   One daughter who lives in [place]    Past Surgical History:  Procedure Laterality Date   cataract extraction left eye     COLONOSCOPY     DUPUYTREN CONTRACTURE RELEASE Left    rt knee arthroscopic      Family History  Problem Relation Age of Onset   Stroke Mother    Colon cancer Father    Cancer Sister        breast     Allergies  Allergen Reactions   Lotensin [Benazepril Hcl] Cough    Current Outpatient Medications on File Prior to Visit  Medication Sig Dispense Refill   aspirin 81 MG tablet Take 81 mg by mouth daily.       benazepril (LOTENSIN) 10 MG tablet TAKE ONE TABLET BY MOUTH DAILY 90 tablet 0   Multiple Vitamin (MULTIVITAMIN) tablet Take 1 tablet by mouth daily.     No current facility-administered medications on file prior to visit.     BP (!) 156/82    Temp (!) 97.2 F (36.2 C) (Temporal)    Ht 5' 10.25" (1.784 m)    Wt 179 lb (81.2 kg)    BMI 25.50 kg/m       Objective:   Physical Exam Vitals signs and nursing note reviewed.  Constitutional:      Appearance: Normal appearance.  HENT:     Head: Normocephalic and atraumatic.     Right Ear: Tympanic membrane, ear canal and external ear normal. There is no impacted cerumen.     Left Ear: Tympanic membrane, ear canal and external ear normal. There is no impacted cerumen.     Nose: Nose normal. No congestion or rhinorrhea.     Mouth/Throat:     Mouth: Mucous membranes are moist.     Pharynx: Oropharynx is clear.  Eyes:     Extraocular Movements: Extraocular movements intact.     Pupils: Pupils are equal, round, and reactive to light.  Cardiovascular:     Rate and Rhythm: Normal rate and regular rhythm.     Pulses: Normal pulses.     Heart sounds: Normal heart sounds. No murmur. No friction rub. No gallop.     Pulmonary:     Effort: Pulmonary effort is normal. No respiratory distress.     Breath sounds: Normal breath sounds. No stridor. No wheezing, rhonchi or rales.  Chest:     Chest wall: No tenderness.  Abdominal:     General: Abdomen is flat. Bowel sounds are normal. There is no distension.  Palpations: Abdomen is soft. There is no mass.     Tenderness: There is no abdominal tenderness. There is no right CVA tenderness, left CVA tenderness, guarding or rebound.     Hernia: No hernia is present.  Musculoskeletal: Normal range of motion.        General: No swelling, tenderness, deformity or signs of injury.     Right lower leg: No edema.  Skin:    General: Skin is warm and dry.     Capillary Refill: Capillary refill takes less than 2 seconds.  Neurological:     General: No focal deficit present.     Mental Status: He is alert and oriented to person, place, and time.  Psychiatric:        Mood and Affect: Mood normal.        Behavior: Behavior normal.        Thought Content: Thought content normal.        Judgment: Judgment normal.        Assessment & Plan:  1. Routine general medical examination at a health care facility - Follow up in one year or sooner if needed - CBC with Differential/Platelet - CMP - Lipid panel - TSH  2. Essential hypertension -  Well controlled. No change in medications  - CBC with Differential/Platelet - CMP - Lipid panel - TSH - verapamil (CALAN-SR) 180 MG CR tablet; Take 1 tablet every morning along with verapamil 240 mg.  Dispense: 90 tablet; Refill: 3  3. Prostate cancer screening  - PSA  4. Erectile dysfunction, unspecified erectile dysfunction type  - sildenafil (REVATIO) 20 MG tablet; Take 1 tablet (20 mg total) by mouth daily as needed.  Dispense: 30 tablet; Refill: 3  5. Need for prophylactic vaccination and inoculation against influenza  - Flu Vaccine QUAD High Dose(Fluad)  [name], NP

## 2019-01-22 ENCOUNTER — Other Ambulatory Visit: Payer: Self-pay | Admitting: Adult Health

## 2019-02-27 ENCOUNTER — Ambulatory Visit: Payer: Medicare HMO | Attending: Internal Medicine

## 2019-02-27 DIAGNOSIS — Z23 Encounter for immunization: Secondary | ICD-10-CM

## 2019-02-27 NOTE — Progress Notes (Signed)
   Covid-19 Vaccination Clinic  Name:  Noah King    MRN: [id number] DOB: May 22, 1945  02/27/2019  [name] was observed post Covid-19 immunization for 15 minutes without incidence. He was provided with Vaccine Information Sheet and instruction to access the V-Safe system.   [name] was instructed to call 911 with any severe reactions post vaccine: Marland Kitchen Difficulty breathing  . Swelling of your face and throat  . A fast heartbeat  . A bad rash all over your body  . Dizziness and weakness    Immunizations Administered    Name Date Dose VIS Date Route   Pfizer COVID-19 Vaccine 02/27/2019  2:32 PM 0.3 mL 12/25/2018 Intramuscular   Manufacturer: [place]   Lot: [id number]   [place]: [id number]

## 2019-03-17 DIAGNOSIS — R69 Illness, unspecified: Secondary | ICD-10-CM | POA: Diagnosis not present

## 2019-03-22 ENCOUNTER — Ambulatory Visit: Payer: Medicare HMO | Attending: Internal Medicine

## 2019-03-22 DIAGNOSIS — Z23 Encounter for immunization: Secondary | ICD-10-CM

## 2019-03-22 NOTE — Progress Notes (Signed)
   Covid-19 Vaccination [facility]  Name:  Noah King    MRN: [id number] DOB: Aug 01, 1945  03/22/2019  [name] was observed post Covid-19 immunization for 15 minutes without incident. He was provided with Vaccine Information Sheet and instruction to access the V-Safe system.   [name] was instructed to call 911 with any severe reactions post vaccine: Marland Kitchen Difficulty breathing  . Swelling of face and throat  . A fast heartbeat  . A bad rash all over body  . Dizziness and weakness   Immunizations Administered    Name Date Dose VIS Date Route   Pfizer COVID-19 Vaccine 03/22/2019  1:13 PM 0.3 mL 12/25/2018 Intramuscular   Manufacturer: [place]   Lot: [id number]   [place]: [id number]

## 2019-04-01 DIAGNOSIS — H353131 Nonexudative age-related macular degeneration, bilateral, early dry stage: Secondary | ICD-10-CM | POA: Diagnosis not present

## 2019-08-24 DIAGNOSIS — Z8249 Family history of ischemic heart disease and other diseases of the circulatory system: Secondary | ICD-10-CM | POA: Diagnosis not present

## 2019-08-24 DIAGNOSIS — N529 Male erectile dysfunction, unspecified: Secondary | ICD-10-CM | POA: Diagnosis not present

## 2019-08-24 DIAGNOSIS — R269 Unspecified abnormalities of gait and mobility: Secondary | ICD-10-CM | POA: Diagnosis not present

## 2019-08-24 DIAGNOSIS — G629 Polyneuropathy, unspecified: Secondary | ICD-10-CM | POA: Diagnosis not present

## 2019-08-24 DIAGNOSIS — Z7982 Long term (current) use of aspirin: Secondary | ICD-10-CM | POA: Diagnosis not present

## 2019-08-24 DIAGNOSIS — Z800 Family history of malignant neoplasm of digestive organs: Secondary | ICD-10-CM | POA: Diagnosis not present

## 2019-08-24 DIAGNOSIS — Z803 Family history of malignant neoplasm of breast: Secondary | ICD-10-CM | POA: Diagnosis not present

## 2019-08-24 DIAGNOSIS — K219 Gastro-esophageal reflux disease without esophagitis: Secondary | ICD-10-CM | POA: Diagnosis not present

## 2019-08-24 DIAGNOSIS — I10 Essential (primary) hypertension: Secondary | ICD-10-CM | POA: Diagnosis not present

## 2019-08-24 DIAGNOSIS — Z9181 History of falling: Secondary | ICD-10-CM | POA: Diagnosis not present

## 2019-10-13 DIAGNOSIS — R69 Illness, unspecified: Secondary | ICD-10-CM | POA: Diagnosis not present

## 2019-10-20 ENCOUNTER — Other Ambulatory Visit: Payer: Self-pay | Admitting: Adult Health

## 2019-12-07 NOTE — Progress Notes (Signed)
Subjective:    Patient ID: Noah King, male    DOB: 11-Dec-1945, 74 y.o.   MRN: [id number]  HPI Patient presents for yearly preventative medicine examination. He is a pleasant 74 year old male who  has a past medical history of Cataract, Dupuytren's contracture, GERD (gastroesophageal reflux disease), Hiatal hernia, Hypertension, and Polycythemia.  Essential Hypertension -he is currently prescribed Lotensin 10 mg daily and verapamil 100 mg daily.  He denies lightheadedness, chest pain, shortness of breath, or syncopal episodes.  He does endorse some intermittent episodes of dizziness he changes positions too quickly.  He does monitor his blood pressure once a week at home and reports readings in the 125's to 130s over 80s.  BP Readings from Last 3 Encounters:  12/08/19 138/82  12/02/18 (!) 156/82  11/27/17 (!) 150/72   Hyperlipidemia -diet controlled.  Not currently on any medication Lab Results  Component Value Date   CHOL 161 12/02/2018   HDL 51.70 12/02/2018   LDLCALC 91 12/02/2018   TRIG 88.0 12/02/2018   CHOLHDL 3 12/02/2018   ED - Uses Viagra PRN   Chronic Cough - has been present for multiple years and is pretty consistent. It is always dry. Back in 2019 we stopped his ACE-I for a period of time and this did not help with the cough. On occasion he will get choked up if the cough starts while eating.  Cough is usually worse in the morning.  Does have episodes of acid reflux from time to time will take over-the-counter Prilosec intermittently.  Unsure if this helps with his cough.  Denies shortness of breath or wheezing.  Does not have dysphagia outside of having a coughing spell while eating  Gait Instability -is been present for many years, was worked up by neurology in 2015 and thought that the gait instability was due to polyneuropathy.  He has gone through physical therapy and did not find this hopeful as he hoped.  He does continue to do the exercises at home.  Walks  with a cane.  Has not had any falls recently.  Has noticed that at times his feet will turn purplish blue and always seem to be colder than other parts of his body.  He does not have constant numbness and tingling in his lower extremities.  All immunizations and health maintenance protocols were reviewed with the patient and needed orders were placed.    Appropriate screening laboratory values were ordered for the patient including screening of hyperlipidemia, renal function and hepatic function. If indicated by BPH, a PSA was ordered.  Medication reconciliation,  past medical history, social history, problem list and allergies were reviewed in detail with the patient  Goals were established with regard to weight loss, exercise, and  diet in compliance with medications Wt Readings from Last 3 Encounters:  12/08/19 175 lb 3.2 oz (79.5 kg)  12/02/18 179 lb (81.2 kg)  11/27/17 181 lb (82.1 kg)    Is up-to-date on routine screening for colon cancer as well as dental and vision exams   Review of Systems  Constitutional: Negative.   HENT: Negative.   Eyes: Negative.   Respiratory: Negative.   Cardiovascular: Negative.   Gastrointestinal: Negative.   Endocrine: Negative.   Genitourinary: Negative.   Musculoskeletal: Positive for gait problem.  Skin: Positive for color change.  Allergic/Immunologic: Negative.   Neurological: Positive for weakness.  Hematological: Negative.   Psychiatric/Behavioral: Negative.   All other systems reviewed and are negative.  Past Medical  History:  Diagnosis Date   Cataract    lft extraction   Dupuytren's contracture    GERD (gastroesophageal reflux disease)    Hiatal hernia    Hypertension    Polycythemia     Social History   Socioeconomic History   Marital status: Single    Spouse name: Not on file   Number of children: Not on file   Years of education: Not on file   Highest education level: Not on file  Occupational History    Not on file  Tobacco Use   Smoking status: Former Smoker    Quit date: 01/14/1985    Years since quitting: 34.9   Smokeless tobacco: Never Used  Substance and Sexual Activity   Alcohol use: Yes    Alcohol/week: 0.0 standard drinks    Comment: beer daily    Drug use: No   Sexual activity: Yes    Partners: Female  Other Topics Concern   Not on file  Social History Narrative   Retired- He worked at [employer]   Divorced   One daughter who lives in [place] Strain:    Difficulty of Paying Living Expenses: Not on file  Food Insecurity:    Worried About Charity fundraiser in the Last Year: Not on file   [place] in the Last Year: Not on file  Transportation Needs:    Film/video editor (Medical): Not on file   Lack of Transportation (Non-Medical): Not on file  Physical Activity:    Days of Exercise per Week: Not on file   Minutes of Exercise per Session: Not on file  Stress:    Feeling of Stress : Not on file  Social Connections:    Frequency of Communication with Friends and Family: Not on file   Frequency of Social Gatherings with Friends and Family: Not on file   Attends Religious Services: Not on file   Active Member of Clubs or Organizations: Not on file   Attends Archivist Meetings: Not on file   Marital Status: Not on file  Intimate Partner Violence:    Fear of Current or Ex-Partner: Not on file   Emotionally Abused: Not on file   Physically Abused: Not on file   Sexually Abused: Not on file    Past Surgical History:  Procedure Laterality Date   cataract extraction left eye     COLONOSCOPY     DUPUYTREN CONTRACTURE RELEASE Left    rt knee arthroscopic      Family History  Problem Relation Age of Onset   Stroke Mother    Colon cancer Father    Cancer Sister        breast     Allergies  Allergen Reactions   Lotensin [Benazepril Hcl] Cough    Current  Outpatient Medications on File Prior to Visit  Medication Sig Dispense Refill   aspirin 81 MG tablet Take 81 mg by mouth daily.       benazepril (LOTENSIN) 10 MG tablet Take 1 tablet (10 mg total) by mouth daily. 90 tablet 0   Multiple Vitamin (MULTIVITAMIN) tablet Take 1 tablet by mouth daily.     No current facility-administered medications on file prior to visit.    BP 138/82 (BP Location: Left Arm, Patient Position: Sitting, Cuff Size: Normal)    Pulse 77    Temp 98.3 F (36.8 C) (Oral)    Ht 5' 10.5" (  1.791 m)    Wt 175 lb 3.2 oz (79.5 kg)    SpO2 98%    BMI 24.78 kg/m       Objective:   Physical Exam Vitals and nursing note reviewed.  Constitutional:      General: He is not in acute distress.    Appearance: Normal appearance. He is well-developed and normal weight.  HENT:     Head: Normocephalic and atraumatic.     Right Ear: Tympanic membrane, ear canal and external ear normal. There is no impacted cerumen.     Left Ear: Tympanic membrane, ear canal and external ear normal. There is no impacted cerumen.     Nose: Nose normal. No congestion or rhinorrhea.     Mouth/Throat:     Mouth: Mucous membranes are moist.     Pharynx: Oropharynx is clear. No oropharyngeal exudate or posterior oropharyngeal erythema.  Eyes:     General:        Right eye: No discharge.        Left eye: No discharge.     Extraocular Movements: Extraocular movements intact.     Conjunctiva/sclera: Conjunctivae normal.     Pupils: Pupils are equal, round, and reactive to light.  Neck:     Vascular: No carotid bruit.     Trachea: No tracheal deviation.  Cardiovascular:     Rate and Rhythm: Normal rate and regular rhythm.     Pulses:          Popliteal pulses are 2+ on the right side and 2+ on the left side.       Dorsalis pedis pulses are 1+ on the right side and 1+ on the left side.       Posterior tibial pulses are 1+ on the right side and 1+ on the left side.     Heart sounds: Normal heart  sounds. No murmur heard.  No friction rub. No gallop.   Pulmonary:     Effort: Pulmonary effort is normal. No respiratory distress.     Breath sounds: Normal breath sounds. No stridor. No wheezing, rhonchi or rales.  Chest:     Chest wall: No tenderness.  Abdominal:     General: Bowel sounds are normal. There is no distension.     Palpations: Abdomen is soft. There is no mass.     Tenderness: There is no abdominal tenderness. There is no right CVA tenderness, left CVA tenderness, guarding or rebound.     Hernia: No hernia is present.  Musculoskeletal:        General: No swelling, tenderness, deformity or signs of injury. Normal range of motion.     Right lower leg: No edema.     Left lower leg: No edema.  Lymphadenopathy:     Cervical: No cervical adenopathy.  Skin:    General: Skin is warm and dry.     Capillary Refill: Capillary refill takes less than 2 seconds.     Coloration: Skin is not jaundiced or pale.     Findings: No bruising, erythema, lesion or rash.     Comments: Bilateral lower extremity coolness with discoloration of skin on both feet, skin is purpleish blue in places throughout both feet.  Neurological:     General: No focal deficit present.     Mental Status: He is alert and oriented to person, place, and time.     Cranial Nerves: No cranial nerve deficit.     Sensory: No sensory deficit.  Motor: No weakness.     Coordination: Coordination normal.     Gait: Gait normal.     Deep Tendon Reflexes: Reflexes normal.  Psychiatric:        Mood and Affect: Mood normal.        Behavior: Behavior normal.        Thought Content: Thought content normal.        Judgment: Judgment normal.       Assessment & Plan:  1. Routine general medical examination at a health care facility -Follow-up in 1 year -10 you to eat healthy and exercise as much as possible - CBC with Differential/Platelet; Future - Lipid panel; Future - TSH; Future - CMP with eGFR([facility]); Future -  DG Chest 2 View; Future - CBC with Differential/Platelet - Lipid panel - TSH - CMP with eGFR([facility])  2. Essential hypertension -Controlled no change in medication - CBC with Differential/Platelet; Future - Lipid panel; Future - TSH; Future - CMP with eGFR([facility]); Future - verapamil (CALAN-SR) 180 MG CR tablet; Take 1 tablet every morning along with verapamil 240 mg.  Dispense: 90 tablet; Refill: 3 - CBC with Differential/Platelet - Lipid panel - TSH - CMP with eGFR([facility])  3. Prostate cancer screening  - PSA; Future - PSA  4. Erectile dysfunction, unspecified erectile dysfunction type  - CBC with Differential/Platelet; Future - Lipid panel; Future - TSH; Future - CMP with eGFR([facility]); Future - sildenafil (REVATIO) 20 MG tablet; Take 1 tablet (20 mg total) by mouth daily as needed.  Dispense: 30 tablet; Refill: 3 - CBC with Differential/Platelet - Lipid panel - TSH - CMP with eGFR([facility])  5. Chronic cough - DG Chest 2 View; Future.  We will trial him on Prilosec he milligrams daily to see if his chronic cough is due to acid reflux.  We note is not due to ACE inhibitor.  Consider referral to pulmonary in the future - omeprazole (PRILOSEC) 20 MG capsule; Take 1 capsule (20 mg total) by mouth daily.  Dispense: 30 capsule; Refill: 0  6. Other polyneuropathy -Likely cause of gait instability.  Will refer to vascular surgery due to decreased pedal pulses and concern for peripheral artery disease.  And consider physical therapy in the future - Ambulatory referral to Vascular Surgery  7. Decreased pedal pulses  - Ambulatory referral to Vascular Surgery

## 2019-12-08 ENCOUNTER — Ambulatory Visit (INDEPENDENT_AMBULATORY_CARE_PROVIDER_SITE_OTHER): Payer: Medicare HMO | Admitting: Adult Health

## 2019-12-08 ENCOUNTER — Encounter: Payer: Self-pay | Admitting: Adult Health

## 2019-12-08 ENCOUNTER — Ambulatory Visit (INDEPENDENT_AMBULATORY_CARE_PROVIDER_SITE_OTHER): Payer: Medicare HMO

## 2019-12-08 ENCOUNTER — Other Ambulatory Visit: Payer: Self-pay

## 2019-12-08 VITALS — BP 138/82 | HR 77 | Temp 98.3°F | Ht 70.5 in | Wt 175.2 lb

## 2019-12-08 DIAGNOSIS — R0989 Other specified symptoms and signs involving the circulatory and respiratory systems: Secondary | ICD-10-CM | POA: Diagnosis not present

## 2019-12-08 DIAGNOSIS — N529 Male erectile dysfunction, unspecified: Secondary | ICD-10-CM

## 2019-12-08 DIAGNOSIS — Z125 Encounter for screening for malignant neoplasm of prostate: Secondary | ICD-10-CM

## 2019-12-08 DIAGNOSIS — Z0000 Encounter for general adult medical examination without abnormal findings: Secondary | ICD-10-CM | POA: Diagnosis not present

## 2019-12-08 DIAGNOSIS — R053 Chronic cough: Secondary | ICD-10-CM | POA: Diagnosis not present

## 2019-12-08 DIAGNOSIS — Z0001 Encounter for general adult medical examination with abnormal findings: Secondary | ICD-10-CM | POA: Diagnosis not present

## 2019-12-08 DIAGNOSIS — I10 Essential (primary) hypertension: Secondary | ICD-10-CM | POA: Diagnosis not present

## 2019-12-08 DIAGNOSIS — G6289 Other specified polyneuropathies: Secondary | ICD-10-CM

## 2019-12-08 DIAGNOSIS — R059 Cough, unspecified: Secondary | ICD-10-CM | POA: Diagnosis not present

## 2019-12-08 MED ORDER — VERAPAMIL HCL ER 180 MG PO TBCR: EXTENDED_RELEASE_TABLET | ORAL | 3 refills | Status: DC

## 2019-12-08 MED ORDER — SILDENAFIL CITRATE 20 MG PO TABS: 20.0000 mg | ORAL_TABLET | Freq: Every day | ORAL | 3 refills | Status: DC | PRN

## 2019-12-08 MED ORDER — OMEPRAZOLE 20 MG PO CPDR: 20.0000 mg | DELAYED_RELEASE_CAPSULE | Freq: Every day | ORAL | 0 refills | Status: DC

## 2019-12-08 NOTE — Patient Instructions (Signed)
It was great seeing you today   I will follow up with you regarding your blood work and chest xray   I am going to refer you to a vascular specialist - they will call you to schedule   I am going to have you try prilosec every day for the cough - let me know if this does not work

## 2019-12-09 LAB — COMPLETE METABOLIC PANEL WITH GFR
AG Ratio: 1.7 (calc) (ref 1.0–2.5)
ALT: 27 U/L (ref 9–46)
AST: 26 U/L (ref 10–35)
Albumin: 4.4 g/dL (ref 3.6–5.1)
Alkaline phosphatase (APISO): 44 U/L (ref 35–144)
BUN: 8 mg/dL (ref 7–25)
CO2: 29 mmol/L (ref 20–32)
Calcium: 9.6 mg/dL (ref 8.6–10.3)
Chloride: 102 mmol/L (ref 98–110)
Creat: 0.78 mg/dL (ref 0.70–1.18)
GFR, Est African American: 102 mL/min/{1.73_m2} (ref >=60)
GFR, Est Non African American: 88 mL/min/{1.73_m2} (ref >=60)
Globulin: 2.6 g/dL (calc) (ref 1.9–3.7)
Glucose, Bld: 97 mg/dL (ref 65–99)
Potassium: 4.7 mmol/L (ref 3.5–5.3)
Sodium: 140 mmol/L (ref 135–146)
Total Bilirubin: 1.1 mg/dL (ref 0.2–1.2)
Total Protein: 7 g/dL (ref 6.1–8.1)

## 2019-12-09 LAB — LIPID PANEL
Cholesterol: 172 mg/dL (ref <200)
HDL: 52 mg/dL (ref >=40)
LDL Cholesterol (Calc): 102 mg/dL (calc) — ABNORMAL HIGH
Non-HDL Cholesterol (Calc): 120 mg/dL (calc) (ref <130)
Total CHOL/HDL Ratio: 3.3 (calc) (ref <5.0)
Triglycerides: 86 mg/dL (ref <150)

## 2019-12-09 LAB — CBC WITH DIFFERENTIAL/PLATELET
Absolute Monocytes: 816 cells/uL (ref 200–950)
Basophils Absolute: 48 cells/uL (ref 0–200)
Basophils Relative: 0.8 %
Eosinophils Absolute: 336 cells/uL (ref 15–500)
Eosinophils Relative: 5.6 %
HCT: 47.6 % (ref 38.5–50.0)
Hemoglobin: 14.7 g/dL (ref 13.2–17.1)
Lymphs Abs: 876 cells/uL (ref 850–3900)
MCH: 24.5 pg — ABNORMAL LOW (ref 27.0–33.0)
MCHC: 30.9 g/dL — ABNORMAL LOW (ref 32.0–36.0)
MCV: 79.3 fL — ABNORMAL LOW (ref 80.0–100.0)
MPV: 10.7 fL (ref 7.5–12.5)
Monocytes Relative: 13.6 %
Neutro Abs: 3924 cells/uL (ref 1500–7800)
Neutrophils Relative %: 65.4 %
Platelets: 210 10*3/uL (ref 140–400)
RBC: 6 10*6/uL — ABNORMAL HIGH (ref 4.20–5.80)
RDW: 18.3 % — ABNORMAL HIGH (ref 11.0–15.0)
Total Lymphocyte: 14.6 %
WBC: 6 10*3/uL (ref 3.8–10.8)

## 2019-12-09 LAB — PSA: PSA: 1.85 ng/mL (ref <=4.0)

## 2019-12-09 LAB — TSH: TSH: 1.57 mIU/L (ref 0.40–4.50)

## 2019-12-14 ENCOUNTER — Ambulatory Visit (INDEPENDENT_AMBULATORY_CARE_PROVIDER_SITE_OTHER): Payer: Medicare HMO

## 2019-12-14 DIAGNOSIS — Z0000 Encounter for general adult medical examination without abnormal findings: Secondary | ICD-10-CM

## 2019-12-14 NOTE — Progress Notes (Signed)
Subjective:   Noah LEGGETTE is a 74 y.o. male who presents for an Initial Medicare Annual Wellness Visit.  I connected with [name]  today by telephone and verified that I am speaking with the correct person using two identifiers. Location patient: home Location provider: work Persons participating in the virtual visit: patient, provider.   I discussed the limitations, risks, security and privacy concerns of performing an evaluation and management service by telephone and the availability of in person appointments. I also discussed with the patient that there may be a patient responsible charge related to this service. The patient expressed understanding and verbally consented to this telephonic visit.    Interactive audio and video telecommunications were attempted between this provider and patient, however failed, due to patient having technical difficulties OR patient did not have access to video capability.  We continued and completed visit with audio only.      Review of Systems    N/A  Cardiac Risk Factors include: advanced age (>61men, >41 women);hypertension;male gender     Objective:    Today's Vitals   There is no height or weight on file to calculate BMI.  Advanced Directives 12/14/2019 07/13/2014 06/30/2014 05/24/2014 12/14/2013  Does Patient Have a Medical Advance Directive? Yes No No No No  Type of Paramedic of [place];Living will - - - -  Does patient want to make changes to medical advance directive? No - Patient declined - - - -  Copy of [place] in Chart? No - copy requested - - - -  Would patient like information on creating a medical advance directive? - - - No - patient declined information No - patient declined information    Current Medications (verified) Outpatient Encounter Medications as of 12/14/2019  Medication Sig  . aspirin 81 MG tablet Take 81 mg by mouth daily.    . benazepril (LOTENSIN)  10 MG tablet Take 1 tablet (10 mg total) by mouth daily.  . Multiple Vitamin (MULTIVITAMIN) tablet Take 1 tablet by mouth daily.  Marland Kitchen omeprazole (PRILOSEC) 20 MG capsule Take 1 capsule (20 mg total) by mouth daily.  . sildenafil (REVATIO) 20 MG tablet Take 1 tablet (20 mg total) by mouth daily as needed.  . verapamil (CALAN-SR) 180 MG CR tablet Take 1 tablet every morning along with verapamil 240 mg.   No facility-administered encounter medications on file as of 12/14/2019.    Allergies (verified) Lotensin [benazepril hcl]   History: Past Medical History:  Diagnosis Date  . Cataract    lft extraction  . Dupuytren's contracture   . GERD (gastroesophageal reflux disease)   . Hiatal hernia   . Hypertension   . Polycythemia    Past Surgical History:  Procedure Laterality Date  . cataract extraction left eye    . COLONOSCOPY    . DUPUYTREN CONTRACTURE RELEASE Left   . rt knee arthroscopic     Family History  Problem Relation Age of Onset  . Stroke Mother   . Colon cancer Father   . Cancer Sister        breast    Social History   Socioeconomic History  . Marital status: Single    Spouse name: Not on file  . Number of children: Not on file  . Years of education: Not on file  . Highest education level: Not on file  Occupational History  . Not on file  Tobacco Use  . Smoking status: Former Smoker  Quit date: 01/14/1985    Years since quitting: 34.9  . Smokeless tobacco: Never Used  Substance and Sexual Activity  . Alcohol use: Yes    Alcohol/week: 0.0 standard drinks    Comment: beer daily   . Drug use: No  . Sexual activity: Yes    Partners: Female  Other Topics Concern  . Not on file  Social History Narrative   Retired- He worked at [employer]   Divorced   One daughter who lives in [place] Strain: [place]   . Difficulty of Paying Living Expenses: Not hard at all  Food Insecurity: No Food Insecurity  .  Worried About Charity fundraiser in the Last Year: Never true  . Ran Out of Food in the Last Year: Never true  Transportation Needs: No Transportation Needs  . Lack of Transportation (Medical): No  . Lack of Transportation (Non-Medical): No  Physical Activity: Insufficiently Active  . Days of Exercise per Week: 5 days  . Minutes of Exercise per Session: 20 min  Stress: No Stress Concern Present  . Feeling of Stress : Not at all  Social Connections: Socially Isolated  . Frequency of Communication with Friends and Family: Once a week  . Frequency of Social Gatherings with Friends and Family: Once a week  . Attends Religious Services: Never  . Active Member of Clubs or Organizations: No  . Attends Archivist Meetings: Never  . Marital Status: Married    Tobacco Counseling Counseling given: Not Answered   Clinical Intake:  Pre-visit preparation completed: Yes  Pain : No/denies pain     Nutritional Risks: None Diabetes: No  How often do you need to have someone help you when you read instructions, pamphlets, or other written materials from your doctor or pharmacy?: 1 - Never What is the last grade level you completed in school?: 12th grade  Diabetic?No      Information entered by :: [place] of Daily Living In your present state of health, do you have any difficulty performing the following activities: 12/14/2019  Hearing? N  Vision? N  Difficulty concentrating or making decisions? N  Walking or climbing stairs? Y  Comment Has issues with stability so does not use the stairs. Needs to have rails in order to walk stairs  Dressing or bathing? N  Doing errands, shopping? N  Preparing Food and eating ? N  Using the Toilet? N  In the past six months, have you accidently leaked urine? N  Do you have problems with loss of bowel control? N  Managing your Medications? N  Managing your Finances? N  Housekeeping or managing your Housekeeping? N    Some recent data might be hidden    Patient Care Team: [name], NP as PCP - General (Family Medicine)  Indicate any recent Medical Services you may have received from other than [facility] providers in the past year (date may be approximate).     Assessment:   This is a routine wellness examination for [place].  Hearing/Vision screen  Hearing Screening   125Hz  250Hz  500Hz  1000Hz  2000Hz  3000Hz  4000Hz  6000Hz  8000Hz   Right ear:           Left ear:           Vision Screening Comments: Patient states gets eyes checked annually. Denies any visual issues   Dietary issues and exercise activities discussed: Current Exercise Habits: Home exercise routine, Type  of exercise: strength training/weights, Time (Minutes): 20, Frequency (Times/Week): 4, Weekly Exercise (Minutes/Week): 80, Intensity: Mild  Goals    . Decrease the likelihood of falling    . Exercise 150 minutes per week (moderate activity)      Depression Screen PHQ 2/9 Scores 12/14/2019 12/08/2019 11/13/2017 11/12/2016 11/09/2014 11/09/2014 10/26/2013  PHQ - 2 Score 0 0 0 0 0 0 0  PHQ- 9 Score 0 - - - - - -    Fall Risk Fall Risk  12/14/2019 12/08/2019 12/02/2018 11/13/2017 11/12/2016  Falls in the past year? 1 1 1  No No  Number falls in past yr: 0 0 0 - -  Injury with Fall? 0 0 0 - -  Risk for fall due to : History of fall(s);Impaired balance/gait History of fall(s) Impaired balance/gait - -  Follow up Falls evaluation completed;Falls prevention discussed Falls evaluation completed - - -    Any stairs in or around the home? No  If so, are there any without handrails? No  Home free of loose throw rugs in walkways, pet beds, electrical cords, etc? Yes  Adequate lighting in your home to reduce risk of falls? Yes   ASSISTIVE DEVICES UTILIZED TO PREVENT FALLS:  Life alert? No  Use of a cane, walker or w/c? Yes  Grab bars in the bathroom? No  Shower chair or bench in shower? No  Elevated toilet seat or a handicapped  toilet? Yes     Cognitive Function:  Cognitive screening not indicated based on direct observation      Immunizations Immunization History  Administered Date(s) Administered  . Fluad Quad(high Dose 65+) 12/02/2018  . Influenza Split 11/27/2010, 12/19/2011  . Influenza Whole 10/20/2008, 11/23/2009  . Influenza, High Dose Seasonal PF 11/09/2014, 11/09/2015, 11/12/2016, 11/13/2017, 10/13/2019  . Influenza-Unspecified 10/30/2012, 10/26/2013  . PFIZER SARS-COV-2 Vaccination 02/27/2019, 03/22/2019  . Pneumococcal Conjugate-13 05/04/2014  . Pneumococcal Polysaccharide-23 03/05/2013  . Td 07/14/2000  . Tdap 08/26/2011    TDAP status: Up to date Flu Vaccine status: Up to date Pneumococcal vaccine status: Up to date Covid-19 vaccine status: Completed vaccines  Qualifies for Shingles Vaccine? Yes   Zostavax completed No   Shingrix Completed?: No.    Education has been provided regarding the importance of this vaccine. Patient has been advised to call insurance company to determine out of pocket expense if they have not yet received this vaccine. Advised may also receive vaccine at local pharmacy or Health Dept. Verbalized acceptance and understanding.  Screening Tests Health Maintenance  Topic Date Due  . COLONOSCOPY  01/19/2020  . TETANUS/TDAP  08/25/2021  . INFLUENZA VACCINE  Completed  . COVID-19 Vaccine  Completed  . Hepatitis C Screening  Completed  . PNA vac Low Risk Adult  Completed    Health Maintenance  There are no preventive care reminders to display for this patient.  Colorectal cancer screening: Completed 01/19/2015. Repeat every 5 years  Lung Cancer Screening: (Low Dose CT Chest recommended if Age 58-80 years, 30 pack-year currently smoking OR have quit w/in 15years.) does not qualify.   Lung Cancer Screening Referral: N/A   Additional Screening:  Hepatitis C Screening: does not qualify; Completed 11/09/2014  Vision Screening: Recommended annual  ophthalmology exams for early detection of glaucoma and other disorders of the eye. Is the patient up to date with their annual eye exam?  Yes  Who is the provider or what is the name of the office in which the patient attends annual eye exams? Dr. Collier [place]  [place] If pt is not established with a provider, would they like to be referred to a provider to establish care? No .   Dental Screening: Recommended annual dental exams for proper oral hygiene  Community Resource Referral / Chronic Care Management: CRR required this visit?  No   CCM required this visit?  No      Plan:     I have personally reviewed and noted the following in the patient's chart:   . Medical and social history . Use of alcohol, tobacco or illicit drugs  . Current medications and supplements . Functional ability and status . Nutritional status . Physical activity . Advanced directives . List of other physicians . Hospitalizations, surgeries, and ER visits in previous 12 months . Vitals . Screenings to include cognitive, depression, and falls . Referrals and appointments  In addition, I have reviewed and discussed with patient certain preventive protocols, quality metrics, and best practice recommendations. A written personalized care plan for preventive services as well as general preventive health recommendations were provided to patient.     Ofilia Neas, LPN   [id number]   Nurse Notes: None

## 2019-12-14 NOTE — Patient Instructions (Signed)
Noah King , Thank you for taking time to come for your Medicare Wellness Visit. I appreciate your ongoing commitment to your health goals. Please review the following plan we discussed and let me know if I can assist you in the future.   Screening recommendations/referrals: Colonoscopy: Up to date, next due 01/19/2020 Recommended yearly ophthalmology/optometry visit for glaucoma screening and checkup Recommended yearly dental visit for hygiene and checkup  Vaccinations: Influenza vaccine: Up to date, next due fall 2022 Pneumococcal vaccine: Completed series Tdap vaccine: Up to date, next due 08/25/2021 Shingles vaccine: Currently due for Shingrix, if you wish to receive we recommend that you do so at your local pharmacy as it is less expensive     Advanced directives: Please bring in copies of your advanced medical directives so that we may scan them into your chart.  Conditions/risks identified: None   Next appointment: None   Preventive Care 65 Years and Older, Male Preventive care refers to lifestyle choices and visits with your health care provider that can promote health and wellness. What does preventive care include?  A yearly physical exam. This is also called an annual well check.  Dental exams once or twice a year.  Routine eye exams. Ask your health care provider how often you should have your eyes checked.  Personal lifestyle choices, including:  Daily care of your teeth and gums.  Regular physical activity.  Eating a healthy diet.  Avoiding tobacco and drug use.  Limiting alcohol use.  Practicing safe sex.  Taking low doses of aspirin every day.  Taking vitamin and mineral supplements as recommended by your health care provider. What happens during an annual well check? The services and screenings done by your health care provider during your annual well check will depend on your age, overall health, lifestyle risk factors, and family history of  disease. Counseling  Your health care provider may ask you questions about your:  Alcohol use.  Tobacco use.  Drug use.  Emotional well-being.  Home and relationship well-being.  Sexual activity.  Eating habits.  History of falls.  Memory and ability to understand (cognition).  Work and work Statistician. Screening  You may have the following tests or measurements:  Height, weight, and BMI.  Blood pressure.  Lipid and cholesterol levels. These may be checked every 5 years, or more frequently if you are over 77 years old.  Skin check.  Lung cancer screening. You may have this screening every year starting at age 65 if you have a 30-pack-year history of smoking and currently smoke or have quit within the past 15 years.  Fecal occult blood test (FOBT) of the stool. You may have this test every year starting at age 9.  Flexible sigmoidoscopy or colonoscopy. You may have a sigmoidoscopy every 5 years or a colonoscopy every 10 years starting at age 69.  Prostate cancer screening. Recommendations will vary depending on your family history and other risks.  Hepatitis C blood test.  Hepatitis B blood test.  Sexually transmitted disease (STD) testing.  Diabetes screening. This is done by checking your blood sugar (glucose) after you have not eaten for a while (fasting). You may have this done every 1-3 years.  Abdominal aortic aneurysm (AAA) screening. You may need this if you are a current or former smoker.  Osteoporosis. You may be screened starting at age 50 if you are at high risk. Talk with your health care provider about your test results, treatment options, and if necessary, the  need for more tests. Vaccines  Your health care provider may recommend certain vaccines, such as:  Influenza vaccine. This is recommended every year.  Tetanus, diphtheria, and acellular pertussis (Tdap, Td) vaccine. You may need a Td booster every 10 years.  Zoster vaccine. You may  need this after age 62.  Pneumococcal 13-valent conjugate (PCV13) vaccine. One dose is recommended after age 23.  Pneumococcal polysaccharide (PPSV23) vaccine. One dose is recommended after age 51. Talk to your health care provider about which screenings and vaccines you need and how often you need them. This information is not intended to replace advice given to you by your health care provider. Make sure you discuss any questions you have with your health care provider. Document Released: 01/27/2015 Document Revised: 09/20/2015 Document Reviewed: 11/01/2014 Elsevier Interactive Patient Education  2017 [employer] Prevention in the Home Falls can cause injuries. They can happen to people of all ages. There are many things you can do to make your home safe and to help prevent falls. What can I do on the outside of my home?  Regularly fix the edges of walkways and driveways and fix any cracks.  Remove anything that might make you trip as you walk through a door, such as a raised step or threshold.  Trim any bushes or trees on the path to your home.  Use bright outdoor lighting.  Clear any walking paths of anything that might make someone trip, such as rocks or tools.  Regularly check to see if handrails are loose or broken. Make sure that both sides of any steps have handrails.  Any raised decks and porches should have guardrails on the edges.  Have any leaves, snow, or ice cleared regularly.  Use sand or salt on walking paths during winter.  Clean up any spills in your garage right away. This includes oil or grease spills. What can I do in the bathroom?  Use night lights.  Install grab bars by the toilet and in the tub and shower. Do not use towel bars as grab bars.  Use non-skid mats or decals in the tub or shower.  If you need to sit down in the shower, use a plastic, non-slip stool.  Keep the floor dry. Clean up any water that spills on the floor as soon as it  happens.  Remove soap buildup in the tub or shower regularly.  Attach bath mats securely with double-sided non-slip rug tape.  Do not have throw rugs and other things on the floor that can make you trip. What can I do in the bedroom?  Use night lights.  Make sure that you have a light by your bed that is easy to reach.  Do not use any sheets or blankets that are too big for your bed. They should not hang down onto the floor.  Have a firm chair that has side arms. You can use this for support while you get dressed.  Do not have throw rugs and other things on the floor that can make you trip. What can I do in the kitchen?  Clean up any spills right away.  Avoid walking on wet floors.  Keep items that you use a lot in easy-to-reach places.  If you need to reach something above you, use a strong step stool that has a grab bar.  Keep electrical cords out of the way.  Do not use floor polish or wax that makes floors slippery. If you must use wax,  use non-skid floor wax.  Do not have throw rugs and other things on the floor that can make you trip. What can I do with my stairs?  Do not leave any items on the stairs.  Make sure that there are handrails on both sides of the stairs and use them. Fix handrails that are broken or loose. Make sure that handrails are as long as the stairways.  Check any carpeting to make sure that it is firmly attached to the stairs. Fix any carpet that is loose or worn.  Avoid having throw rugs at the top or bottom of the stairs. If you do have throw rugs, attach them to the floor with carpet tape.  Make sure that you have a light switch at the top of the stairs and the bottom of the stairs. If you do not have them, ask someone to add them for you. What else can I do to help prevent falls?  Wear shoes that:  Do not have high heels.  Have rubber bottoms.  Are comfortable and fit you well.  Are closed at the toe. Do not wear sandals.  If you  use a stepladder:  Make sure that it is fully opened. Do not climb a closed stepladder.  Make sure that both sides of the stepladder are locked into place.  Ask someone to hold it for you, if possible.  Clearly mark and make sure that you can see:  Any grab bars or handrails.  First and last steps.  Where the edge of each step is.  Use tools that help you move around (mobility aids) if they are needed. These include:  Canes.  Walkers.  Scooters.  Crutches.  Turn on the lights when you go into a dark area. Replace any light bulbs as soon as they burn out.  Set up your furniture so you have a clear path. Avoid moving your furniture around.  If any of your floors are uneven, fix them.  If there are any pets around you, be aware of where they are.  Review your medicines with your doctor. Some medicines can make you feel dizzy. This can increase your chance of falling. Ask your doctor what other things that you can do to help prevent falls. This information is not intended to replace advice given to you by your health care provider. Make sure you discuss any questions you have with your health care provider. Document Released: 10/27/2008 Document Revised: 06/08/2015 Document Reviewed: 02/04/2014 Elsevier Interactive Patient Education  2017 [employer].

## 2019-12-22 ENCOUNTER — Other Ambulatory Visit: Payer: Self-pay | Admitting: *Deleted

## 2019-12-22 DIAGNOSIS — I739 Peripheral vascular disease, unspecified: Secondary | ICD-10-CM

## 2019-12-24 ENCOUNTER — Encounter: Payer: Self-pay | Admitting: Vascular Surgery

## 2019-12-24 ENCOUNTER — Ambulatory Visit: Payer: Medicare HMO | Admitting: Vascular Surgery

## 2019-12-24 ENCOUNTER — Other Ambulatory Visit: Payer: Self-pay

## 2019-12-24 ENCOUNTER — Ambulatory Visit (HOSPITAL_COMMUNITY)
Admission: RE | Admit: 2019-12-24 | Discharge: 2019-12-24 | Disposition: A | Discharge status: Home / Self Care | Payer: Medicare HMO | Source: Ambulatory Visit | Attending: Vascular Surgery | Admitting: Vascular Surgery

## 2019-12-24 VITALS — BP 136/79 | HR 90 | Temp 98.4°F | Resp 20 | Ht 70.5 in | Wt 176.0 lb

## 2019-12-24 DIAGNOSIS — I739 Peripheral vascular disease, unspecified: Secondary | ICD-10-CM

## 2019-12-24 NOTE — Progress Notes (Signed)
Patient ID: Noah King, male   DOB: November 29, 1945, 74 y.o.   MRN: [id number]  Reason for Consult: New Patient (Initial Visit)   Referred by Noah Peng, NP  Subjective:     HPI:  Noah King is a 74 y.o. male with bilateral lower extremity cold feet and occasional pain.  He states that the pain is intermittent only.  He does not have any claudication.  He is a former smoker.  He also has risk factors of hypertension.  He denies any history of stroke TIA or amaurosis.  No coronary artery disease.  Denies any personal or family history of aneurysm.  Does not have any tissue loss or ulceration.  States that it about 3-4 times a year he has pain at the dorsum of his feet it does help with taking Advil.  This occurs only at night.  This is never lasted more than a few hours.  Past Medical History:  Diagnosis Date  . Cataract    lft extraction  . Dupuytren's contracture   . GERD (gastroesophageal reflux disease)   . Hiatal hernia   . Hypertension   . Polycythemia    Family History  Problem Relation Age of Onset  . Stroke Mother   . Colon cancer Father   . Cancer Sister        breast    Past Surgical History:  Procedure Laterality Date  . cataract extraction left eye    . COLONOSCOPY    . DUPUYTREN CONTRACTURE RELEASE Left   . rt knee arthroscopic      Short Social History:  Social History   Tobacco Use  . Smoking status: Former Smoker    Quit date: 01/14/1985    Years since quitting: 34.9  . Smokeless tobacco: Never Used  Substance Use Topics  . Alcohol use: Yes    Alcohol/week: 0.0 standard drinks    Comment: beer daily     Allergies  Allergen Reactions  . Lotensin [Benazepril Hcl] Cough    Current Outpatient Medications  Medication Sig Dispense Refill  . aspirin 81 MG tablet Take 81 mg by mouth daily.    . benazepril (LOTENSIN) 10 MG tablet Take 1 tablet (10 mg total) by mouth daily. 90 tablet 0  . Multiple Vitamin (MULTIVITAMIN) tablet Take 1  tablet by mouth daily.    Marland Kitchen omeprazole (PRILOSEC) 20 MG capsule Take 1 capsule (20 mg total) by mouth daily. 30 capsule 0  . sildenafil (REVATIO) 20 MG tablet Take 1 tablet (20 mg total) by mouth daily as needed. 30 tablet 3  . verapamil (CALAN-SR) 180 MG CR tablet Take 1 tablet every morning along with verapamil 240 mg. 90 tablet 3   No current facility-administered medications for this visit.    Review of Systems  Constitutional:  Constitutional negative. HENT: HENT negative.  Eyes: Eyes negative.  Respiratory: Respiratory negative.  Cardiovascular: Cardiovascular negative.  GI: Gastrointestinal negative.  Musculoskeletal: Positive for leg pain.  Neurological: Neurological negative. Hematologic: Hematologic/lymphatic negative.  Psychiatric: Psychiatric negative.        Objective:  Objective   Vitals:   12/24/19 1526  BP: 136/79  Pulse: 90  Resp: 20  Temp: 98.4 F (36.9 C)  SpO2: 97%  Weight: 176 lb (79.8 kg)  Height: 5' 10.5" (1.791 m)   Body mass index is 24.9 kg/m.  Physical Exam HENT:     Nose:     Comments: Wearing a mask Eyes:     Pupils: Pupils are  equal, round, and reactive to light.  Neck:     Vascular: No carotid bruit.  Cardiovascular:     Pulses:          Radial pulses are 2+ on the right side and 2+ on the left side.       Femoral pulses are 2+ on the right side.      Popliteal pulses are 2+ on the right side and 2+ on the left side.       Dorsalis pedis pulses are 0 on the right side and 0 on the left side.       Posterior tibial pulses are 0 on the right side and 0 on the left side.  Abdominal:     General: Abdomen is flat.     Palpations: Abdomen is soft. There is no mass.  Musculoskeletal:     Cervical back: Normal range of motion and neck supple.  Skin:    General: Skin is warm and dry.     Capillary Refill: Capillary refill takes 2 to 3 seconds.  Neurological:     General: No focal deficit present.     Mental Status: He is alert.   Psychiatric:        Mood and Affect: Mood normal.        Thought Content: Thought content normal.        Judgment: Judgment normal.     Data: ABI Findings:  +---------+------------------+-----+--------+--------+  Right  Rt Pressure (mmHg)IndexWaveformComment   +---------+------------------+-----+--------+--------+  Brachial 153                     +---------+------------------+-----+--------+--------+  PTA   255        1.62 biphasic      +---------+------------------+-----+--------+--------+  DP    163        1.04 biphasic      +---------+------------------+-----+--------+--------+  Great Toe130        0.83           +---------+------------------+-----+--------+--------+   +---------+------------------+-----+--------+-------+  Left   Lt Pressure (mmHg)IndexWaveformComment  +---------+------------------+-----+--------+-------+  Brachial 157                     +---------+------------------+-----+--------+-------+  PTA   176        1.12 biphasic      +---------+------------------+-----+--------+-------+  DP    140        0.89 biphasic      +---------+------------------+-----+--------+-------+  Great Toe106        0.68           +---------+------------------+-----+--------+-------+      Assessment/Plan:     74 year old male with coolness and discoloration of his bilateral feet.  He gives a history of 3-4 times per year rest pain but his ABIs really are preserved and although his capillary refill is somewhat delayed he does not have any tissue loss I do not believe he has critical limb ischemia.  I recommended aspirin and statin and follow-up in 1 year with repeat ABIs     Noah Sandy MD [facility]

## 2020-01-04 ENCOUNTER — Other Ambulatory Visit: Payer: Self-pay

## 2020-01-04 ENCOUNTER — Encounter: Payer: Self-pay | Admitting: Adult Health

## 2020-01-04 DIAGNOSIS — R053 Chronic cough: Secondary | ICD-10-CM

## 2020-01-04 MED ORDER — OMEPRAZOLE 20 MG PO CPDR: 20.0000 mg | DELAYED_RELEASE_CAPSULE | Freq: Every day | ORAL | 2 refills | Status: DC

## 2020-01-05 ENCOUNTER — Telehealth: Payer: Self-pay | Admitting: Adult Health

## 2020-01-05 NOTE — Progress Notes (Signed)
  Chronic Care Management   Note  01/05/2020 Name: Noah King MRN: [id number] DOB: [birth year]  Noah King is a 74 y.o. year old male who is a primary care patient of [name], NP. I reached out to [employer] by phone today in response to a referral sent by Mr. [place] PCP, [name], NP.   Noah King was given information about Chronic Care Management services today including:  1. CCM service includes personalized support from designated clinical staff supervised by his physician, including individualized plan of care and coordination with other care providers 2. 24/7 contact phone numbers for assistance for urgent and routine care needs. 3. Service will only be billed when office clinical staff spend 20 minutes or more in a month to coordinate care. 4. Only one practitioner may furnish and bill the service in a calendar month. 5. The patient may stop CCM services at any time (effective at the end of the month) by phone call to the office staff.   Patient agreed to services and verbal consent obtained.   Follow up plan:   Carley Perdue [employer] Scheduler

## 2020-01-07 ENCOUNTER — Other Ambulatory Visit: Payer: Self-pay | Admitting: Adult Health

## 2020-01-07 DIAGNOSIS — R053 Chronic cough: Secondary | ICD-10-CM

## 2020-01-18 ENCOUNTER — Other Ambulatory Visit: Payer: Self-pay | Admitting: Adult Health

## 2020-01-18 NOTE — Telephone Encounter (Signed)
Sent to the pharmacy by e-scribe. 

## 2020-02-22 ENCOUNTER — Encounter: Payer: Self-pay | Admitting: Gastroenterology

## 2020-02-22 ENCOUNTER — Telehealth: Payer: Self-pay | Admitting: Pharmacist

## 2020-02-22 NOTE — Chronic Care Management (AMB) (Signed)
° ° °  Chronic Care Management Pharmacy Assistant   Name: Noah King  MRN: [id number] DOB: 04/20/1945  Reason for Encounter: Medication Review/Initial Questions for Pharmacist visit on 02-23-2020  Patient Questions: 1. Have you seen any other providers since your last visit?  Noah Sandy, MD ([facility]) 2. Any changes in your medications or health? No 3. Any side effects from any medications? No 4. Do you have any symptoms or problems not managed by your medications? No 5. Any concerns about your health right now? No 6. Has your provider asked that you check blood pressure, blood sugar, or follow a special diet at home? No 7. Do you get any type of exercise regularly?    Yes, tries to walk 2-3 times a week 8. Can you think of a goal you would like to reach for your health? No 9. Do you have any problems getting your medications? No 10. Is there anything that you would like to discuss during the appointment? No  The patient was asked to please bring medications, blood pressure/ blood sugar log, and supplements to his appointment.        PCP : Noah Peng, NP  Allergies:   Allergies  Allergen Reactions   Lotensin [Benazepril Hcl] Cough    Medications: Outpatient Encounter Medications as of 02/22/2020  Medication Sig   aspirin 81 MG tablet Take 81 mg by mouth daily.   benazepril (LOTENSIN) 10 MG tablet TAKE ONE TABLET BY MOUTH DAILY   Multiple Vitamin (MULTIVITAMIN) tablet Take 1 tablet by mouth daily.   omeprazole (PRILOSEC) 20 MG capsule TAKE ONE CAPSULE BY MOUTH DAILY   sildenafil (REVATIO) 20 MG tablet Take 1 tablet (20 mg total) by mouth daily as needed.   verapamil (CALAN-SR) 180 MG CR tablet Take 1 tablet every morning along with verapamil 240 mg.   No facility-administered encounter medications on file as of 02/22/2020.    Current Diagnosis: Patient Active Problem List   Diagnosis Date Noted   HERPES ZOSTER 12/13/2009    POLYCYTHEMIA RUBRA VERA 04/16/2007   Contracture of palmar fascia 04/16/2007   Hypothyroidism 08/29/2006   Hyperlipidemia 08/29/2006   Essential hypertension 08/29/2006    Goals Addressed   None     Follow-Up:  Pharmacist Review   Maia Breslow, [place] Assistant [phone]

## 2020-02-23 ENCOUNTER — Other Ambulatory Visit: Payer: Self-pay | Admitting: Adult Health

## 2020-02-23 ENCOUNTER — Ambulatory Visit (INDEPENDENT_AMBULATORY_CARE_PROVIDER_SITE_OTHER): Payer: Medicare HMO | Admitting: Pharmacist

## 2020-02-23 DIAGNOSIS — E785 Hyperlipidemia, unspecified: Secondary | ICD-10-CM

## 2020-02-23 DIAGNOSIS — I10 Essential (primary) hypertension: Secondary | ICD-10-CM

## 2020-02-23 DIAGNOSIS — E039 Hypothyroidism, unspecified: Secondary | ICD-10-CM | POA: Diagnosis not present

## 2020-02-23 NOTE — Progress Notes (Signed)
Chronic Care Management Pharmacy Note  02/29/2020 Name:  Noah King MRN:  [id number] DOB:  1945-11-11  Subjective: Noah King is an 75 y.o. year old male who is a primary patient of [name], NP.  The CCM team was consulted for assistance with disease management and care coordination needs.    Engaged with patient by telephone for initial visit in response to provider referral for pharmacy case management and/or care coordination services.   Consent to Services:  The patient was given the following information about Chronic Care Management services today, agreed to services, and gave verbal consent: 1. CCM service includes personalized support from designated clinical staff supervised by the primary care provider, including individualized plan of care and coordination with other care providers 2. 24/7 contact phone numbers for assistance for urgent and routine care needs. 3. Service will only be billed when office clinical staff spend 20 minutes or more in a month to coordinate care. 4. Only one practitioner may furnish and bill the service in a calendar month. 5.The patient may stop CCM services at any time (effective at the end of the month) by phone call to the office staff. 6. The patient will be responsible for cost sharing (co-pay) of up to 20% of the service fee (after annual deductible is met). Patient agreed to services and consent obtained.  Patient Care Team: [name], NP as PCP - General (Family Medicine) [name], [facility] as Pharmacist (Pharmacist)  Recent office visits: 12/14/19 Ofilia Neas, LPN: Patient presented for medicare annual wellness visit.  12/08/19 [name], NP: Patient presented for annual exam. Referral placed for vasc surgery. Prescribed omeprazole 20 mg daily.  Recent consult visits: 12/24/19 Servando Snare, MD (vascular surgery): Patient presented for initial visit for PAD. Recommended aspirin and statin and repeat ABIs in 1  year.  04/01/19 Alois Cliche (optometry): Patient presented for eye exam.  Hospital visits: None in previous 6 months  Objective:  Lab Results  Component Value Date   CREATININE 0.78 12/08/2019   BUN 8 12/08/2019   GFR 93.12 12/02/2018   GFRNONAA 88 12/08/2019   GFRAA 102 12/08/2019   NA 140 12/08/2019   K 4.7 12/08/2019   CALCIUM 9.6 12/08/2019   CO2 29 12/08/2019    Lab Results  Component Value Date/Time   GFR 93.12 12/02/2018 07:27 AM   GFR 93.89 11/13/2017 07:34 AM    Last diabetic Eye exam: No results found for: HMDIABEYEEXA  Last diabetic Foot exam: No results found for: HMDIABFOOTEX   Lab Results  Component Value Date   CHOL 172 12/08/2019   HDL 52 12/08/2019   LDLCALC 102 (H) 12/08/2019   TRIG 86 12/08/2019   CHOLHDL 3.3 12/08/2019    Hepatic Function Latest Ref Rng & Units 12/08/2019 12/02/2018 11/13/2017  Total Protein 6.1 - 8.1 g/dL 7.0 7.1 6.9  Albumin 3.5 - 5.2 g/dL - 4.4 4.3  AST 10 - 35 U/L 26 31 19   ALT 9 - 46 U/L 27 35 23  Alk Phosphatase 39 - 117 U/L - 45 42  Total Bilirubin 0.2 - 1.2 mg/dL 1.1 0.8 1.1  Bilirubin, Direct 0.0 - 0.3 mg/dL - - 0.2    Lab Results  Component Value Date/Time   TSH 1.57 12/08/2019 08:19 AM   TSH 1.75 12/02/2018 07:27 AM    CBC Latest Ref Rng & Units 12/08/2019 12/02/2018 11/13/2017  WBC 3.8 - 10.8 Thousand/uL 6.0 6.1 5.9  Hemoglobin 13.2 - 17.1 g/dL 14.7 13.2 14.1  Hematocrit 38.5 - 50.0 % 47.6 41.7 42.9  Platelets 140 - 400 Thousand/uL 210 194.0 188.0    No results found for: VD25OH  Clinical ASCVD: No  The 10-year ASCVD risk score Mikey Bussing DC Jr., et al., 2013) is: 29.4%   Values used to calculate the score:     Age: 81 years     Sex: Male     Is Non-Hispanic African American: No     Diabetic: No     Tobacco smoker: No     Systolic Blood Pressure: 119 mmHg     Is BP treated: Yes     HDL Cholesterol: 52 mg/dL     Total Cholesterol: 172 mg/dL    Depression screen [facility] 2/9 12/14/2019 12/08/2019  11/13/2017  Decreased Interest 0 0 0  Down, Depressed, Hopeless 0 0 0  PHQ - 2 Score 0 0 0  Altered sleeping 0 - -  Tired, decreased energy 0 - -  Change in appetite 0 - -  Feeling bad or failure about yourself  0 - -  Trouble concentrating 0 - -  Moving slowly or fidgety/restless 0 - -  Suicidal thoughts 0 - -  PHQ-9 Score 0 - -  Difficult doing work/chores Not difficult at all - -     Social History   Tobacco Use  Smoking Status Former Smoker  . Quit date: 01/14/1985  . Years since quitting: 35.1  Smokeless Tobacco Never Used   BP Readings from Last 3 Encounters:  12/24/19 136/79  12/08/19 138/82  12/02/18 (!) 156/82   Pulse Readings from Last 3 Encounters:  12/24/19 90  12/08/19 77  11/13/17 64   Wt Readings from Last 3 Encounters:  12/24/19 176 lb (79.8 kg)  12/08/19 175 lb 3.2 oz (79.5 kg)  12/02/18 179 lb (81.2 kg)    Assessment/Interventions: Review of patient past medical history, allergies, medications, health status, including review of consultants reports, laboratory and other test data, was performed as part of comprehensive evaluation and provision of chronic care management services.   SDOH:  (Social Determinants of Health) assessments and interventions performed: Yes   SDOH Interventions   Flowsheet Row Most Recent Value  SDOH Interventions   Financial Strain Interventions Intervention Not Indicated  Transportation Interventions Intervention Not Indicated     SDOH Interventions   Flowsheet Row Most Recent Value  SDOH Interventions   Financial Strain Interventions Intervention Not Indicated  Transportation Interventions Intervention Not Indicated     Patient is retired and gets up when he wakes up around 8am and fixes coffee and reads the newspaper. He then fixes breakfast and takes his medications. Patient use to do a lot of fishing but hasn't done much fishing lately because his friend moved away and doesn't want to go alone.  Patient enjoys  doing crossword puzzles. Patient lives alone and doesn't see family often but has one daughter that lives in [place], [place]. Patient's family is in [place] and talks with daughter on the phone twice a week and has a sister who checks on him once a week.   Patient does all of the cooking and cleaning and mostly cooking at home. Patient eats about 95% of meals at home. Patient eats a lot of chicken and quite a bit of veggies and more in the summertime and gets them fresh from the farmers market. Patient keeps canned foods in his pantry as well, which includes green beans, corn, pinto beans. Patient eats red meat a couple times a week,  sometimes porkchops and fish at least once a week.   Patient enjoys walking 2-3 times a week and has been slacking on it lately due to the cold weather. Patient doesn't have any steps in his house but does walk around inside his house as well.  Patient sleeps well and "sometimes more than he needs to" and gets about 9 hours per night and feels rested. He sometimes sneaks in a nap in the afternoon.  Patient reports a cough with benazepril and was switched to a different medication but this didn't seem to help. He denies any other problems with his medications.   CCM Care Plan  Allergies  Allergen Reactions  . Lotensin [Benazepril Hcl] Cough    Medications Reviewed Today    Reviewed by [name], [facility] (Pharmacist) on 02/23/20 at 1610  Med List Status: <None>  Medication Order Taking? Sig Documenting Provider Last Dose Status Informant  aspirin 81 MG tablet [id number] Yes Take 81 mg by mouth daily. [provider] Taking Active   benazepril (LOTENSIN) 10 MG tablet [id number] Yes TAKE ONE TABLET BY MOUTH DAILY Nafziger, [name], NP Taking Active   Multiple Vitamin (MULTIVITAMIN) tablet [id number] Yes Take 1 tablet by mouth daily. [provider] Taking Active   omeprazole (PRILOSEC) 20 MG capsule [id number] Yes TAKE ONE CAPSULE BY MOUTH DAILY  Nafziger, [name], NP Taking Active   sildenafil (REVATIO) 20 MG tablet [id number] Yes Take 1 tablet (20 mg total) by mouth daily as needed. Nafziger, [name], NP Taking Active   verapamil (CALAN-SR) 180 MG CR tablet [id number] Yes Take 1 tablet every morning along with verapamil 240 mg. [name], NP Taking Active           Patient Active Problem List   Diagnosis Date Noted  . HERPES ZOSTER 12/13/2009  . POLYCYTHEMIA RUBRA VERA 04/16/2007  . Contracture of palmar fascia 04/16/2007  . Hypothyroidism 08/29/2006  . Hyperlipidemia 08/29/2006  . Essential hypertension 08/29/2006    Immunization History  Administered Date(s) Administered  . Fluad Quad(high Dose 65+) 12/02/2018  . Influenza Split 11/27/2010, 12/19/2011  . Influenza Whole 10/20/2008, 11/23/2009  . Influenza, High Dose Seasonal PF 11/09/2014, 11/09/2015, 11/12/2016, 11/13/2017, 10/13/2019  . Influenza-Unspecified 10/30/2012, 10/26/2013  . PFIZER(Purple Top)SARS-COV-2 Vaccination 02/27/2019, 03/22/2019, 10/13/2019  . Pneumococcal Conjugate-13 05/04/2014  . Pneumococcal Polysaccharide-23 03/05/2013  . Td 07/14/2000  . Tdap 08/26/2011    Conditions to be addressed/monitored:  Hypertension, Hyperlipidemia, Hypothyroidism and ED and chronic cough  Care Plan : CCM Pharmacy care plan  Updates made by [name], [place] since 02/29/2020 12:00 AM    Problem: Problem: Hypertension, hyperlipidemia, hypothyroidism, chronic cough, and ED     Long-Range Goal: Care plan   Start Date: 02/23/2020  Expected End Date: 02/22/2021  This Visit's Progress: On track  Priority: High  Note:   Current Barriers:  . Suboptimal therapeutic regimen for high blood pressure given chronic cough  Pharmacist Clinical Goal(s):  Marland Kitchen Over the next 90 days, patient will achieve adherence to monitoring guidelines and medication adherence to achieve therapeutic efficacy . maintain control of blood pressure as evidenced by home blood pressure monitoring   through collaboration with PharmD and provider.   Interventions: . 1:1 collaboration with [name], NP regarding development and update of comprehensive plan of care as evidenced by provider attestation and co-signature . Inter-disciplinary care team collaboration (see longitudinal plan of care) . Comprehensive medication review performed; medication list updated in electronic medical record  Hypertension (BP goal <140/90) -controlled -Current  treatment: . Verapamil 180 mg 1 tablet daily  . Benazepril 10 mg 1 tablet daily  -Medications previously tried: none -Current home readings: n/a -Current dietary habits: patient uses little salt in cooking (uses sea salt), rinses vegetables when they are canned and chooses lower sodium options -Current exercise habits: not active currently with the bad weather  -Denies hypotensive/hypertensive symptoms -Educated on Exercise goal of 150 minutes per week; Importance of home blood pressure monitoring; -Counseled to monitor BP at home weekly, document, and provide log at future appointments -Recommended switching benazepril to ARB to see if cough is improved  -Patient admits to some orthostatic hypotension   Hyperlipidemia/PAD: (LDL goal < 100) -uncontrolled -Current treatment: . No medications . Aspirin 81 mg 1 tablet daily  -Medications previously tried: none -Current dietary patterns: patient eats lots of vegetables and leaner meats -Current exercise habits: not active currently with the bad weather -Educated on Benefits of statin for ASCVD risk reduction; Exercise goal of 150 minutes per week; -Collaborated with PCP on statin recommendations  Chronic cough (Goal: minimize symptoms of cough) -uncontrolled -Current treatment  . Omeprazole 20 mg 1 capsule daily -Medications previously tried: none  -Recommended to continue current medication  ED  -controlled -Current treatment  . Sildenafil 20 mg 1 tablet daily as  needed -Medications previously tried: none  -Recommended to continue current medication  Health Maintenance -Vaccine gaps: Shingles -Current therapy:  . Multivitamin 1 tablet daily -Educated on Cost vs benefit of each product must be carefully weighed by individual consumer -Patient is satisfied with current therapy and denies issues -Recommended to continue current medication   Patient Goals/Self-Care Activities . Over the next 90 days, patient will:  - take medications as prescribed check blood pressure weekly, document, and provide at future appointments target a minimum of 150 minutes of moderate intensity exercise weekly  Follow Up Plan: Telephone follow up appointment with care management team member scheduled for: 3 months    Medication Assistance: None required.  Patient affirms current coverage meets needs.     Medication Assistance: None required.  Patient affirms current coverage meets needs.  Patient's preferred pharmacy is:  [facility] [place], [address] [place] [address] Phone: [phone] Fax: [phone]  Uses pill box? Yes - 7 day pill box Pt endorses 100% compliance - cannot remember the last time he missed a dose  We discussed: Benefits of medication synchronization, packaging and delivery as well as enhanced pharmacist oversight with [employer]. Patient decided to: Continue current medication management strategy  Follow Up:  Patient agrees to Care Plan and Follow-up.  Plan: Telephone follow up appointment with care management team member scheduled for:  3 months  [name], PharmD [place] Pharmacist [place] [facility] [place] [phone]

## 2020-02-29 NOTE — Patient Instructions (Signed)
Hi Noah King,  It was lovely to get to meet you over the phone! Below are some of the topics we discussed. I would like to encourage you to continue to keep up the good work with taking care of yourself through taking your medications as prescribed and keeping a healthy diet. I think it would be beneficial for you to check your blood pressure at home weekly and challenge yourself to try some at home exercises that don't involve going outside during the bad weather.  Please give me a call if you have any questions or need anything from me before our follow up!  Best, Maddie  Noah King, PharmD [facility] Clinical Pharmacist [place] [facility] [place]   Visit Information  Patient Care Plan: CCM Pharmacy care plan    Problem Identified: Problem: Hypertension, hyperlipidemia, hypothyroidism, chronic cough, and ED     Long-Range Goal: Care plan   Start Date: 02/23/2020  Expected End Date: 02/22/2021  This Visit's Progress: On track  Priority: High  Note:   Current Barriers:  . Suboptimal therapeutic regimen for high blood pressure given chronic cough  Pharmacist Clinical Goal(s):  Marland Kitchen Over the next 90 days, patient will achieve adherence to monitoring guidelines and medication adherence to achieve therapeutic efficacy . maintain control of blood pressure as evidenced by home blood pressure monitoring  through collaboration with PharmD and provider.   Interventions: . 1:1 collaboration with Noah Peng, NP regarding development and update of comprehensive plan of care as evidenced by provider attestation and co-signature . Inter-disciplinary care team collaboration (see longitudinal plan of care) . Comprehensive medication review performed; medication list updated in electronic medical record  Hypertension (BP goal <140/90) -controlled -Current treatment: . Verapamil 180 mg 1 tablet daily  . Benazepril 10 mg 1 tablet daily  -Medications previously tried:  none -Current home readings: n/a -Current dietary habits: patient uses little salt in cooking (uses sea salt), rinses vegetables when they are canned and chooses lower sodium options -Current exercise habits: not active currently with the bad weather  -Denies hypotensive/hypertensive symptoms -Educated on Exercise goal of 150 minutes per week; Importance of home blood pressure monitoring; -Counseled to monitor BP at home weekly, document, and provide log at future appointments -Recommended switching benazepril to ARB to see if cough is improved  -Patient admits to some orthostatic hypotension   Hyperlipidemia/PAD: (LDL goal < 100) -uncontrolled -Current treatment: . No medications . Aspirin 81 mg 1 tablet daily  -Medications previously tried: none -Current dietary patterns: patient eats lots of vegetables and leaner meats -Current exercise habits: not active currently with the bad weather -Educated on Benefits of statin for ASCVD risk reduction; Exercise goal of 150 minutes per week; -Collaborated with PCP on statin recommendations  Chronic cough (Goal: minimize symptoms of cough) -uncontrolled -Current treatment  . Omeprazole 20 mg 1 capsule daily -Medications previously tried: none  -Recommended to continue current medication  ED  -controlled -Current treatment  . Sildenafil 20 mg 1 tablet daily as needed -Medications previously tried: none  -Recommended to continue current medication  Health Maintenance -Vaccine gaps: Shingles -Current therapy:  . Multivitamin 1 tablet daily -Educated on Cost vs benefit of each product must be carefully weighed by individual consumer -Patient is satisfied with current therapy and denies issues -Recommended to continue current medication   Patient Goals/Self-Care Activities . Over the next 90 days, patient will:  - take medications as prescribed check blood pressure weekly, document, and provide at future appointments target a  minimum  of 150 minutes of moderate intensity exercise weekly  Follow Up Plan: Telephone follow up appointment with care management team member scheduled for: 3 months    Medication Assistance: None required.  Patient affirms current coverage meets needs.     Noah King was given information about Chronic Care Management services today including:  1. CCM service includes personalized support from designated clinical staff supervised by his physician, including individualized plan of care and coordination with other care providers 2. 24/7 contact phone numbers for assistance for urgent and routine care needs. 3. Standard insurance, coinsurance, copays and deductibles apply for chronic care management only during months in which we provide at least 20 minutes of these services. Most insurances cover these services at 100%, however patients may be responsible for any copay, coinsurance and/or deductible if applicable. This service may help you avoid the need for more expensive face-to-face services. 4. Only one practitioner may furnish and bill the service in a calendar month. 5. The patient may stop CCM services at any time (effective at the end of the month) by phone call to the office staff.  Patient agreed to services and verbal consent obtained.   The patient verbalized understanding of instructions, educational materials, and care plan provided today and agreed to receive a mailed copy of patient instructions, educational materials, and care plan.  Telephone follow up appointment with pharmacy team member scheduled for: 3 months  Noah King, [facility]

## 2020-03-01 ENCOUNTER — Other Ambulatory Visit: Payer: Self-pay | Admitting: Adult Health

## 2020-03-01 MED ORDER — LOSARTAN POTASSIUM 25 MG PO TABS: 25.0000 mg | ORAL_TABLET | Freq: Every day | ORAL | 0 refills | Status: DC

## 2020-03-01 MED ORDER — ATORVASTATIN CALCIUM 20 MG PO TABS: 20.0000 mg | ORAL_TABLET | Freq: Every day | ORAL | 3 refills | Status: DC

## 2020-03-03 ENCOUNTER — Telehealth: Payer: Self-pay | Admitting: Pharmacist

## 2020-03-03 NOTE — Telephone Encounter (Signed)
Called patient to inform him of medication changes after discussion with PCP from CCM visit last week.  Losartan was prescribed to replace benazepril to see if this helps with his cough. Patient is aware to stop taking benazepril and start losartan.  Patient is also aware of the new medication, atorvastatin, and to start taking daily. Patient agreed with the plan and did not have any questions.  Scheduled follow up to check on BP in 2-3 weeks with PCP.

## 2020-03-22 ENCOUNTER — Ambulatory Visit (INDEPENDENT_AMBULATORY_CARE_PROVIDER_SITE_OTHER): Payer: Medicare HMO | Admitting: Adult Health

## 2020-03-22 ENCOUNTER — Encounter: Payer: Self-pay | Admitting: Adult Health

## 2020-03-22 ENCOUNTER — Other Ambulatory Visit: Payer: Self-pay

## 2020-03-22 VITALS — BP 126/82 | HR 74 | Temp 98.2°F | Resp 18 | Wt 178.0 lb

## 2020-03-22 DIAGNOSIS — I10 Essential (primary) hypertension: Secondary | ICD-10-CM | POA: Diagnosis not present

## 2020-03-22 DIAGNOSIS — R21 Rash and other nonspecific skin eruption: Secondary | ICD-10-CM | POA: Diagnosis not present

## 2020-03-22 MED ORDER — LOSARTAN POTASSIUM 25 MG PO TABS: 25.0000 mg | ORAL_TABLET | Freq: Every day | ORAL | 3 refills | Status: DC

## 2020-03-22 NOTE — Progress Notes (Signed)
Subjective:    Patient ID: Noah King, male    DOB: [birth year], 75 y.o.   MRN: [id number]  HPI  75 year old male who  has a past medical history of Cataract, Dupuytren's contracture, GERD (gastroesophageal reflux disease), Hiatal hernia, Hypertension, and Polycythemia.  He presents to the office today for follow-up regarding hypertension.  Was previously controlled with verapamil 180 mg daily and benazepril 10 mg daily.  After speaking with our clinical pharmacist he divulged that he had a dry cough.  At this time it was recommended to try switching him to ARB, he was prescribed losartan 25 mg daily.  He reports today that his cough has improved significantly.  He has been monitoring his blood pressure is at home and has readings consistently in the 120s to 140s over 70s to 80s.  He has not noticed any side effects of losartan.  Additionally, he has an area on his inner upper leg that has been discolored for the last few months.  Denies pain but does endorse some itching.  Has not put any topical creams on this area.  Review of Systems See HPI   Past Medical History:  Diagnosis Date  . Cataract    lft extraction  . Dupuytren's contracture   . GERD (gastroesophageal reflux disease)   . Hiatal hernia   . Hypertension   . Polycythemia     Social History   Socioeconomic History  . Marital status: Single    Spouse name: Not on file  . Number of children: Not on file  . Years of education: Not on file  . Highest education level: Not on file  Occupational History  . Not on file  Tobacco Use  . Smoking status: Former Smoker    Quit date: 01/14/1985    Years since quitting: 35.2  . Smokeless tobacco: Never Used  Vaping Use  . Vaping Use: Never used  Substance and Sexual Activity  . Alcohol use: Yes    Alcohol/week: 0.0 standard drinks    Comment: beer daily   . Drug use: No  . Sexual activity: Yes    Partners: Female  Other Topics Concern  . Not on file  Social  History Narrative   Retired- He worked at [employer]   Divorced   One daughter who lives in [place] Strain: [place]   . Difficulty of Paying Living Expenses: Not hard at all  Food Insecurity: No Food Insecurity  . Worried About Charity fundraiser in the Last Year: Never true  . Ran Out of Food in the Last Year: Never true  Transportation Needs: No Transportation Needs  . Lack of Transportation (Medical): No  . Lack of Transportation (Non-Medical): No  Physical Activity: Insufficiently Active  . Days of Exercise per Week: 5 days  . Minutes of Exercise per Session: 20 min  Stress: No Stress Concern Present  . Feeling of Stress : Not at all  Social Connections: Socially Isolated  . Frequency of Communication with Friends and Family: Once a week  . Frequency of Social Gatherings with Friends and Family: Once a week  . Attends Religious Services: Never  . Active Member of Clubs or Organizations: No  . Attends Archivist Meetings: Never  . Marital Status: Married  Human resources officer Violence: Not At Risk  . Fear of Current or Ex-Partner: No  . Emotionally Abused: No  . Physically Abused: No  .  Sexually Abused: No    Past Surgical History:  Procedure Laterality Date  . cataract extraction left eye    . COLONOSCOPY    . DUPUYTREN CONTRACTURE RELEASE Left   . rt knee arthroscopic      Family History  Problem Relation Age of Onset  . Stroke Mother   . Colon cancer Father   . Cancer Sister        breast     Allergies  Allergen Reactions  . Lotensin [Benazepril Hcl] Cough    Current Outpatient Medications on File Prior to Visit  Medication Sig Dispense Refill  . aspirin 81 MG tablet Take 81 mg by mouth daily.    Marland Kitchen atorvastatin (LIPITOR) 20 MG tablet Take 1 tablet (20 mg total) by mouth daily. 90 tablet 3  . losartan (COZAAR) 25 MG tablet Take 1 tablet (25 mg total) by mouth daily. 30 tablet 0  . Multiple Vitamin  (MULTIVITAMIN) tablet Take 1 tablet by mouth daily.    Marland Kitchen omeprazole (PRILOSEC) 20 MG capsule TAKE ONE CAPSULE BY MOUTH DAILY 30 capsule 2  . sildenafil (REVATIO) 20 MG tablet Take 1 tablet (20 mg total) by mouth daily as needed. 30 tablet 3  . verapamil (CALAN-SR) 180 MG CR tablet Take 1 tablet every morning along with verapamil 240 mg. 90 tablet 3   No current facility-administered medications on file prior to visit.    BP (!) 142/73 (BP Location: Left Arm, Patient Position: Sitting, Cuff Size: Normal)   Pulse 74   Temp 98.2 F (36.8 C) (Oral)   Resp 18   Wt 178 lb (80.7 kg)   SpO2 97%   BMI 25.18 kg/m       Objective:   Physical Exam Vitals and nursing note reviewed.  Constitutional:      Appearance: Normal appearance.  Cardiovascular:     Rate and Rhythm: Normal rate and regular rhythm.     Pulses: Normal pulses.     Heart sounds: Normal heart sounds.  Pulmonary:     Effort: Pulmonary effort is normal.     Breath sounds: Normal breath sounds.  Skin:    General: Skin is warm and dry.     Comments: Does have a rather large discolored flat patch with a nonraised border  Neurological:     General: No focal deficit present.     Mental Status: He is alert and oriented to person, place, and time.  Psychiatric:        Mood and Affect: Mood normal.        Behavior: Behavior normal.        Thought Content: Thought content normal.        Judgment: Judgment normal.       Assessment & Plan:  1. Essential hypertension -Blood pressure well controlled.  Cough is improving.  We will continue with losartan 25 mg daily - losartan (COZAAR) 25 MG tablet; Take 1 tablet (25 mg total) by mouth daily.  Dispense: 90 tablet; Refill: 3  2. Rash and nonspecific skin eruption -Does not necessarily appear fungal.  We will have him trial using small amount of over-the-counter cortisone 10 and antifungal cream, mixing together and applying a thin layer twice daily.  We will also refer to  dermatology in case this does not clear up - Ambulatory referral to Dermatology  [name], NP

## 2020-03-31 ENCOUNTER — Other Ambulatory Visit: Payer: Self-pay | Admitting: Adult Health

## 2020-03-31 DIAGNOSIS — R053 Chronic cough: Secondary | ICD-10-CM

## 2020-04-25 DIAGNOSIS — H3540 Unspecified peripheral retinal degeneration: Secondary | ICD-10-CM | POA: Diagnosis not present

## 2020-04-25 DIAGNOSIS — Z0100 Encounter for examination of eyes and vision without abnormal findings: Secondary | ICD-10-CM | POA: Diagnosis not present

## 2020-04-25 DIAGNOSIS — H2513 Age-related nuclear cataract, bilateral: Secondary | ICD-10-CM | POA: Diagnosis not present

## 2020-04-25 DIAGNOSIS — H3530 Unspecified macular degeneration: Secondary | ICD-10-CM | POA: Diagnosis not present

## 2020-05-01 ENCOUNTER — Telehealth: Payer: Self-pay | Admitting: Pharmacist

## 2020-05-01 NOTE — Telephone Encounter (Signed)
Patient called when office was closed after hours on Thursday to discuss billing for his CCM visit.  Called patient back today to discuss concerns with bill. Patient reports CCM bill does not appear to have gone through his insurance and was unsure how to proceed. He confirmed he still has [employer] which has not changed from Dec 2021.  Will reach out to billing team to discuss.

## 2020-05-02 ENCOUNTER — Telehealth: Payer: Self-pay

## 2020-05-02 NOTE — Telephone Encounter (Signed)
called pt to update pt on billing question  from 02/23/2020. Pt was billed and insurance was not filled. I informed the pt. the pt the claim was resubmitted. Pt verbalized understanding.

## 2020-05-05 DIAGNOSIS — E663 Overweight: Secondary | ICD-10-CM | POA: Diagnosis not present

## 2020-05-05 DIAGNOSIS — Z008 Encounter for other general examination: Secondary | ICD-10-CM | POA: Diagnosis not present

## 2020-05-05 DIAGNOSIS — I10 Essential (primary) hypertension: Secondary | ICD-10-CM | POA: Diagnosis not present

## 2020-05-05 DIAGNOSIS — K219 Gastro-esophageal reflux disease without esophagitis: Secondary | ICD-10-CM | POA: Diagnosis not present

## 2020-05-05 DIAGNOSIS — R269 Unspecified abnormalities of gait and mobility: Secondary | ICD-10-CM | POA: Diagnosis not present

## 2020-05-05 DIAGNOSIS — N529 Male erectile dysfunction, unspecified: Secondary | ICD-10-CM | POA: Diagnosis not present

## 2020-05-05 DIAGNOSIS — G629 Polyneuropathy, unspecified: Secondary | ICD-10-CM | POA: Diagnosis not present

## 2020-05-05 DIAGNOSIS — Z6825 Body mass index (BMI) 25.0-25.9, adult: Secondary | ICD-10-CM | POA: Diagnosis not present

## 2020-05-05 DIAGNOSIS — K5900 Constipation, unspecified: Secondary | ICD-10-CM | POA: Diagnosis not present

## 2020-05-05 DIAGNOSIS — J301 Allergic rhinitis due to pollen: Secondary | ICD-10-CM | POA: Diagnosis not present

## 2020-05-05 DIAGNOSIS — E785 Hyperlipidemia, unspecified: Secondary | ICD-10-CM | POA: Diagnosis not present

## 2020-06-15 ENCOUNTER — Other Ambulatory Visit: Payer: Self-pay

## 2020-08-22 ENCOUNTER — Ambulatory Visit: Payer: Medicare HMO | Admitting: Physician Assistant

## 2020-09-13 ENCOUNTER — Encounter: Payer: Self-pay | Admitting: Gastroenterology

## 2020-09-24 ENCOUNTER — Other Ambulatory Visit: Payer: Self-pay | Admitting: Adult Health

## 2020-09-24 DIAGNOSIS — R053 Chronic cough: Secondary | ICD-10-CM

## 2020-10-03 ENCOUNTER — Telehealth: Payer: Self-pay | Admitting: Pharmacist

## 2020-10-03 NOTE — Chronic Care Management (AMB) (Signed)
Chronic Care Management Pharmacy Assistant   Name: Noah King  MRN: [id number] DOB: May 10, 1945   Reason for Encounter: Disease State   Conditions to be addressed/monitored: HTN  Recent office visits:  None   Recent consult visits:  None   Hospital visits:  None in previous 6 months  Medications: Outpatient Encounter Medications as of 10/03/2020  Medication Sig   aspirin 81 MG tablet Take 81 mg by mouth daily.   atorvastatin (LIPITOR) 20 MG tablet Take 1 tablet (20 mg total) by mouth daily.   losartan (COZAAR) 25 MG tablet Take 1 tablet (25 mg total) by mouth daily.   Multiple Vitamin (MULTIVITAMIN) tablet Take 1 tablet by mouth daily.   omeprazole (PRILOSEC) 20 MG capsule TAKE ONE CAPSULE BY MOUTH DAILY   sildenafil (REVATIO) 20 MG tablet Take 1 tablet (20 mg total) by mouth daily as needed.   verapamil (CALAN-SR) 180 MG CR tablet Take 1 tablet every morning along with verapamil 240 mg.   No facility-administered encounter medications on file as of 10/03/2020.  Reviewed chart prior to disease state call. Spoke with patient regarding BP  Recent Office Vitals: BP Readings from Last 3 Encounters:  03/22/20 126/82  12/24/19 136/79  12/08/19 138/82   Pulse Readings from Last 3 Encounters:  03/22/20 74  12/24/19 90  12/08/19 77    Wt Readings from Last 3 Encounters:  03/22/20 178 lb (80.7 kg)  12/24/19 176 lb (79.8 kg)  12/08/19 175 lb 3.2 oz (79.5 kg)     Kidney Function Lab Results  Component Value Date/Time   CREATININE 0.78 12/08/2019 08:19 AM   CREATININE 0.81 12/02/2018 07:27 AM   CREATININE 0.85 11/13/2017 07:34 AM   GFR 93.12 12/02/2018 07:27 AM   GFRNONAA 88 12/08/2019 08:19 AM   GFRAA 102 12/08/2019 08:19 AM    BMP Latest Ref Rng & Units 12/08/2019 12/02/2018 11/13/2017  Glucose 65 - 99 mg/dL 97 112(H) 94  BUN 7 - 25 mg/dL 8 9 8   Creatinine 0.70 - 1.18 mg/dL 0.78 0.81 0.85  BUN/Creat Ratio 6 - 22 (calc) NOT APPLICABLE - -  Sodium 528 -  146 mmol/L 140 140 140  Potassium 3.5 - 5.3 mmol/L 4.7 4.1 4.5  Chloride 98 - 110 mmol/L 102 103 103  CO2 20 - 32 mmol/L 29 28 31   Calcium 8.6 - 10.3 mg/dL 9.6 9.5 9.4    Current antihypertensive regimen:  Losartan 25 mg - taking in the morning How often are you checking your Blood Pressure? Patient reports he is checking a few times a week Current home BP readings: 145/80 on this morning What recent interventions/DTPs have been made by any provider to improve Blood Pressure control since last CPP Visit: Patient reports since last Pharmacist visit his medication for pressure is now Losartan 25 mg  Any recent hospitalizations or ED visits since last visit with CPP? None What diet changes have been made to improve Blood Pressure Control?  Patient reports for breakfast he will have eggs fruit and toast, for lunch and dinner he eats a lot of canned vegetables and he is rinsing them two times. He reports he eats out every now and again but not often. Notes: Patient reports his pressures have slowly been on the incline. He reports he has had his machine checked with another machine at his dentists office and they were very similar readings so not his machine. He reports he has not been having any headaches or any other issues. Advised  I would make the Pharmacist aware, offered him a follow up appointment he accepted.  Adherence Review: Is the patient currently on ACE/ARB medication? Yes Does the patient have >5 day gap between last estimated fill dates? No  Care Gaps: Zoster Vaccine - Overdue Colonoscopy - Overdue COVID Booster #4 Therapist, music) - Overdue Flu Vaccine - Overdue AWV - Done 12-14-19 - MSG sent to Ramond Craver CMA to schedule. CCM - 01-24-21  Star Rating Drugs: Losartan (Cozaar) 25 mg - Last filled 09-21-2020 90 DS at [facility] Atorvastatin ( Lipitor) 20 mg - Last filled 08-27-2020 90 DS at [facility] Pharmacist Assistant [phone]

## 2020-10-26 ENCOUNTER — Ambulatory Visit (AMBULATORY_SURGERY_CENTER): Payer: Medicare HMO | Admitting: *Deleted

## 2020-10-26 ENCOUNTER — Other Ambulatory Visit: Payer: Self-pay

## 2020-10-26 ENCOUNTER — Encounter: Payer: Self-pay | Admitting: Gastroenterology

## 2020-10-26 VITALS — Ht 71.0 in | Wt 180.0 lb

## 2020-10-26 DIAGNOSIS — Z86010 Personal history of colonic polyps: Secondary | ICD-10-CM

## 2020-10-26 DIAGNOSIS — Z800 Family history of malignant neoplasm of digestive organs: Secondary | ICD-10-CM

## 2020-10-26 MED ORDER — PEG 3350-KCL-NA BICARB-NACL 420 G PO SOLR: 4000.0000 mL | Freq: Once | ORAL | 0 refills | Status: AC

## 2020-10-26 NOTE — Progress Notes (Signed)
Pt verified name, DOB, address and insurance during PV today.  Pt mailed instruction packet of Emmi video, copy of consent form to read and not return, and instructions.   PV completed over the phone.  Pt encouraged to call with questions or issues.  My Chart instructions to pt as well    No egg or soy allergy known to patient  issues known to pt with past sedation with any surgeries or procedures- last colon hard to wake post op - had MAC  Patient denies ever being told they had issues or difficulty with intubation  No FH of Malignant Hyperthermia Pt is not on diet pills Pt is not on  home 02  Pt is not on blood thinners  Pt denies issues with constipation  No A fib or A flutter  Pt is fully vaccinated  for Covid    NO PA's for preps discussed with pt In PV today  Discussed with pt there will be an out-of-pocket cost for prep and that varies from $0 to 70 +  dollars - pt verbalized understanding   Due to the COVID-19 pandemic we are asking patients to follow certain guidelines in PV and the [place]   Pt aware of COVID protocols and [facility] guidelines

## 2020-10-31 ENCOUNTER — Ambulatory Visit (INDEPENDENT_AMBULATORY_CARE_PROVIDER_SITE_OTHER): Payer: Medicare HMO

## 2020-10-31 DIAGNOSIS — Z0000 Encounter for general adult medical examination without abnormal findings: Secondary | ICD-10-CM | POA: Diagnosis not present

## 2020-10-31 NOTE — Patient Instructions (Signed)
[name] , Thank you for taking time to come for your Medicare Wellness Visit. I appreciate your ongoing commitment to your health goals. Please review the following plan we discussed and let me know if I can assist you in the future.   Screening recommendations/referrals: Colonoscopy: scheduled 11/08/2020 Recommended yearly ophthalmology/optometry visit for glaucoma screening and checkup Recommended yearly dental visit for hygiene and checkup  Vaccinations: Influenza vaccine: due in fall 2022  Pneumococcal vaccine: completed series  Tdap vaccine: 08/15/2021 Shingles vaccine: will consider     Advanced directives: will provide copies   Conditions/risks identified: none   Next appointment: 12/12/2020  0800  Noah King   Preventive Care 76 Years and Older, Male Preventive care refers to lifestyle choices and visits with your health care provider that can promote health and wellness. What does preventive care include? A yearly physical exam. This is also called an annual well check. Dental exams once or twice a year. Routine eye exams. Ask your health care provider how often you should have your eyes checked. Personal lifestyle choices, including: Daily care of your teeth and gums. Regular physical activity. Eating a healthy diet. Avoiding tobacco and drug use. Limiting alcohol use. Practicing safe sex. Taking low doses of aspirin every day. Taking vitamin and mineral supplements as recommended by your health care provider. What happens during an annual well check? The services and screenings done by your health care provider during your annual well check will depend on your age, overall health, lifestyle risk factors, and family history of disease. Counseling  Your health care provider may ask you questions about your: Alcohol use. Tobacco use. Drug use. Emotional well-being. Home and relationship well-being. Sexual activity. Eating habits. History of  falls. Memory and ability to understand (cognition). Work and work Statistician. Screening  You may have the following tests or measurements: Height, weight, and BMI. Blood pressure. Lipid and cholesterol levels. These may be checked every 5 years, or more frequently if you are over 25 years old. Skin check. Lung cancer screening. You may have this screening every year starting at age 42 if you have a 30-pack-year history of smoking and currently smoke or have quit within the past 15 years. Fecal occult blood test (FOBT) of the stool. You may have this test every year starting at age 34. Flexible sigmoidoscopy or colonoscopy. You may have a sigmoidoscopy every 5 years or a colonoscopy every 10 years starting at age 38. Prostate cancer screening. Recommendations will vary depending on your family history and other risks. Hepatitis C blood test. Hepatitis B blood test. Sexually transmitted disease (STD) testing. Diabetes screening. This is done by checking your blood sugar (glucose) after you have not eaten for a while (fasting). You may have this done every 1-3 years. Abdominal aortic aneurysm (AAA) screening. You may need this if you are a current or former smoker. Osteoporosis. You may be screened starting at age 63 if you are at high risk. Talk with your health care provider about your test results, treatment options, and if necessary, the need for more tests. Vaccines  Your health care provider may recommend certain vaccines, such as: Influenza vaccine. This is recommended every year. Tetanus, diphtheria, and acellular pertussis (Tdap, Td) vaccine. You may need a Td booster every 10 years. Zoster vaccine. You may need this after age 65. Pneumococcal 13-valent conjugate (PCV13) vaccine. One dose is recommended after age 69. Pneumococcal polysaccharide (PPSV23) vaccine. One dose is recommended after age 71. Talk to your health care  provider about which screenings and vaccines you need and  how often you need them. This information is not intended to replace advice given to you by your health care provider. Make sure you discuss any questions you have with your health care provider. Document Released: 01/27/2015 Document Revised: 09/20/2015 Document Reviewed: 11/01/2014 Elsevier Interactive Patient Education  2017 [place] Prevention in the Home Falls can cause injuries. They can happen to people of all ages. There are many things you can do to make your home safe and to help prevent falls. What can I do on the outside of my home? Regularly fix the edges of walkways and driveways and fix any cracks. Remove anything that might make you trip as you walk through a door, such as a raised step or threshold. Trim any bushes or trees on the path to your home. Use bright outdoor lighting. Clear any walking paths of anything that might make someone trip, such as rocks or tools. Regularly check to see if handrails are loose or broken. Make sure that both sides of any steps have handrails. Any raised decks and porches should have guardrails on the edges. Have any leaves, snow, or ice cleared regularly. Use sand or salt on walking paths during winter. Clean up any spills in your garage right away. This includes oil or grease spills. What can I do in the bathroom? Use night lights. Install grab bars by the toilet and in the tub and shower. Do not use towel bars as grab bars. Use non-skid mats or decals in the tub or shower. If you need to sit down in the shower, use a plastic, non-slip stool. Keep the floor dry. Clean up any water that spills on the floor as soon as it happens. Remove soap buildup in the tub or shower regularly. Attach bath mats securely with double-sided non-slip rug tape. Do not have throw rugs and other things on the floor that can make you trip. What can I do in the bedroom? Use night lights. Make sure that you have a light by your bed that is easy to  reach. Do not use any sheets or blankets that are too big for your bed. They should not hang down onto the floor. Have a firm chair that has side arms. You can use this for support while you get dressed. Do not have throw rugs and other things on the floor that can make you trip. What can I do in the kitchen? Clean up any spills right away. Avoid walking on wet floors. Keep items that you use a lot in easy-to-reach places. If you need to reach something above you, use a strong step stool that has a grab bar. Keep electrical cords out of the way. Do not use floor polish or wax that makes floors slippery. If you must use wax, use non-skid floor wax. Do not have throw rugs and other things on the floor that can make you trip. What can I do with my stairs? Do not leave any items on the stairs. Make sure that there are handrails on both sides of the stairs and use them. Fix handrails that are broken or loose. Make sure that handrails are as long as the stairways. Check any carpeting to make sure that it is firmly attached to the stairs. Fix any carpet that is loose or worn. Avoid having throw rugs at the top or bottom of the stairs. If you do have throw rugs, attach them to the  floor with carpet tape. Make sure that you have a light switch at the top of the stairs and the bottom of the stairs. If you do not have them, ask someone to add them for you. What else can I do to help prevent falls? Wear shoes that: Do not have high heels. Have rubber bottoms. Are comfortable and fit you well. Are closed at the toe. Do not wear sandals. If you use a stepladder: Make sure that it is fully opened. Do not climb a closed stepladder. Make sure that both sides of the stepladder are locked into place. Ask someone to hold it for you, if possible. Clearly mark and make sure that you can see: Any grab bars or handrails. First and last steps. Where the edge of each step is. Use tools that help you move  around (mobility aids) if they are needed. These include: Canes. Walkers. Scooters. Crutches. Turn on the lights when you go into a dark area. Replace any light bulbs as soon as they burn out. Set up your furniture so you have a clear path. Avoid moving your furniture around. If any of your floors are uneven, fix them. If there are any pets around you, be aware of where they are. Review your medicines with your doctor. Some medicines can make you feel dizzy. This can increase your chance of falling. Ask your doctor what other things that you can do to help prevent falls. This information is not intended to replace advice given to you by your health care provider. Make sure you discuss any questions you have with your health care provider. Document Released: 10/27/2008 Document Revised: 06/08/2015 Document Reviewed: 02/04/2014 Elsevier Interactive Patient Education  2017 [employer].

## 2020-10-31 NOTE — Progress Notes (Signed)
Subjective:   Noah King is a 75 y.o. male who presents for an Subsequent Medicare Annual Wellness Visit.    Virtual Visit via Video Note  I connected with [name] by a video enabled telemedicine application and verified that I am speaking with the correct person using two identifiers.  Location: Patient: Home Provider: Office Persons participating in the virtual visit: patient, provider   I discussed the limitations of evaluation and management by telemedicine and the availability of in person appointments. The patient expressed understanding and agreed to proceed.     Mickel Baas Hartwell Vandiver,LPN  Review of Systems     Cardiac Risk Factors include: advanced age (>83men, >53 women);dyslipidemia;hypertension;male gender     Objective:    Today's Vitals   There is no height or weight on file to calculate BMI.  Advanced Directives 10/31/2020 12/14/2019 07/13/2014 06/30/2014 05/24/2014 12/14/2013  Does Patient Have a Medical Advance Directive? Yes Yes No No No No  Type of Paramedic of [place];Living will [place];Living will - - - -  Does patient want to make changes to medical advance directive? - No - Patient declined - - - -  Copy of [place] in Chart? No - copy requested No - copy requested - - - -  Would patient like information on creating a medical advance directive? - - - - No - patient declined information No - patient declined information    Current Medications (verified) Outpatient Encounter Medications as of 10/31/2020  Medication Sig   amoxicillin (AMOXIL) 500 MG capsule Take 500 mg by mouth 3 (three) times daily.   aspirin 81 MG tablet Take 81 mg by mouth daily.   atorvastatin (LIPITOR) 20 MG tablet Take 1 tablet (20 mg total) by mouth daily.   Multiple Vitamin (MULTIVITAMIN) tablet Take 1 tablet by mouth daily.   omeprazole (PRILOSEC) 20 MG capsule TAKE ONE CAPSULE BY MOUTH DAILY    sildenafil (REVATIO) 20 MG tablet Take 1 tablet (20 mg total) by mouth daily as needed.   verapamil (CALAN-SR) 180 MG CR tablet Take 1 tablet every morning along with verapamil 240 mg. (Patient taking differently: Take 1 tablet every morning)   losartan (COZAAR) 25 MG tablet Take 1 tablet (25 mg total) by mouth daily.   No facility-administered encounter medications on file as of 10/31/2020.    Allergies (verified) Lotensin [benazepril hcl]   History: Past Medical History:  Diagnosis Date   Allergy    Cataract    left  extraction- right eye forming   Dupuytren's contracture    GERD (gastroesophageal reflux disease)    Hiatal hernia    past hx-  suspect at one time   Hypertension    Polycythemia    Past Surgical History:  Procedure Laterality Date   cataract extraction left eye     COLONOSCOPY     DUPUYTREN CONTRACTURE RELEASE Left    POLYPECTOMY     rt knee arthroscopic     Family History  Problem Relation Age of Onset   Stroke Mother    Colon cancer Father    Breast cancer Sister    Cancer Sister        breast    Colon polyps Neg Hx    Esophageal cancer Neg Hx    Social History   Socioeconomic History   Marital status: Single    Spouse name: Not on file   Number of children: Not on file   Years  of education: Not on file   Highest education level: Not on file  Occupational History   Not on file  Tobacco Use   Smoking status: Former    Types: Cigarettes    Quit date: 01/14/1985    Years since quitting: 35.8   Smokeless tobacco: Never  Vaping Use   Vaping Use: Never used  Substance and Sexual Activity   Alcohol use: Yes    Alcohol/week: 0.0 standard drinks    Comment: beer daily    Drug use: No   Sexual activity: Yes    Partners: Female  Other Topics Concern   Not on file  Social History Narrative   Retired- He worked at [employer]   Divorced   One daughter who lives in [place] Strain: Low Risk     Difficulty of Paying Living Expenses: Not hard at all  Food Insecurity: No Food Insecurity   Worried About Charity fundraiser in the Last Year: Never true   Arboriculturist in the Last Year: Never true  Transportation Needs: No Transportation Needs   Lack of Transportation (Medical): No   Lack of Transportation (Non-Medical): No  Physical Activity: Sufficiently Active   Days of Exercise per Week: 5 days   Minutes of Exercise per Session: 30 min  Stress: No Stress Concern Present   Feeling of Stress : Not at all  Social Connections: Socially Isolated   Frequency of Communication with Friends and Family: Twice a week   Frequency of Social Gatherings with Friends and Family: Twice a week   Attends Religious Services: Never   Printmaker: No   Attends Music therapist: Never   Marital Status: Divorced    Tobacco Counseling Counseling given: Not Answered   Clinical Intake:  Pre-visit preparation completed: Yes  Pain : No/denies pain     Nutritional Risks: None Diabetes: No  How often do you need to have someone help you when you read instructions, pamphlets, or other written materials from your doctor or pharmacy?: 1 - Never What is the last grade level you completed in school?: High School  Diabetic?no  Interpreter Needed?: No  Information entered by :: l.Dayveon Halley,LPN   Activities of Daily Living In your present state of health, do you have any difficulty performing the following activities: 10/31/2020 12/14/2019  Hearing? N N  Vision? N N  Difficulty concentrating or making decisions? N N  Walking or climbing stairs? N Y  Comment - Has issues with stability so does not use the stairs. Needs to have rails in order to walk stairs  Dressing or bathing? N N  Doing errands, shopping? N N  Preparing Food and eating ? N N  Using the Toilet? N N  In the past six months, have you accidently leaked urine? N N  Do you have problems  with loss of bowel control? N N  Managing your Medications? N N  Managing your Finances? N N  Housekeeping or managing your Housekeeping? N N  Some recent data might be hidden    Patient Care Team: [name], NP as PCP - General (Family Medicine) [name], [facility] as Pharmacist (Pharmacist)  Indicate any recent Medical Services you may have received from other than [facility] providers in the past year (date may be approximate).     Assessment:   This is a routine wellness examination for Noah King.  Hearing/Vision screen Vision  Screening - Comments:: Annual eye exams wears glasses   Dietary issues and exercise activities discussed: Current Exercise Habits: Home exercise routine, Type of exercise: walking, Time (Minutes): 30, Frequency (Times/Week): 5, Weekly Exercise (Minutes/Week): 150, Exercise limited by: None identified   Goals Addressed   None    Depression Screen PHQ 2/9 Scores 10/31/2020 12/14/2019 12/08/2019 11/13/2017 11/12/2016 11/09/2014 11/09/2014  PHQ - 2 Score 0 0 0 0 0 0 0  PHQ- 9 Score - 0 - - - - -    Fall Risk Fall Risk  10/31/2020 12/14/2019 12/08/2019 12/02/2018 11/13/2017  Falls in the past year? 0 1 1 1  No  Number falls in past yr: 0 0 0 0 -  Injury with Fall? 0 0 0 0 -  Risk for fall due to : - History of fall(s);Impaired balance/gait History of fall(s) Impaired balance/gait -  Follow up Falls evaluation completed Falls evaluation completed;Falls prevention discussed Falls evaluation completed - -    FALL RISK PREVENTION PERTAINING TO THE HOME:  Any stairs in or around the home? No  If so, are there any without handrails? No  Home free of loose throw rugs in walkways, pet beds, electrical cords, etc? Yes  Adequate lighting in your home to reduce risk of falls? Yes   ASSISTIVE DEVICES UTILIZED TO PREVENT FALLS:  Life alert? No  Use of a cane, walker or w/c? Yes  Grab bars in the bathroom? No  Shower chair or bench in shower? No  Elevated  toilet seat or a handicapped toilet? Yes    Cognitive Function:    Normal cognitive status assessed by direct observation by this Nurse Health Advisor. No abnormalities found.      Immunizations Immunization History  Administered Date(s) Administered   Fluad Quad(high Dose 65+) 12/02/2018   Influenza Split 11/27/2010, 12/19/2011   Influenza Whole 10/20/2008, 11/23/2009   Influenza, High Dose Seasonal PF 11/09/2014, 11/09/2015, 11/12/2016, 11/13/2017, 10/13/2019   Influenza-Unspecified 10/30/2012, 10/26/2013   PFIZER(Purple Top)SARS-COV-2 Vaccination 02/27/2019, 03/22/2019, 10/13/2019   Pneumococcal Conjugate-13 05/04/2014   Pneumococcal Polysaccharide-23 03/05/2013   Td 07/14/2000   Tdap 08/26/2011    TDAP status: Up to date  Flu Vaccine status: Due, Education has been provided regarding the importance of this vaccine. Advised may receive this vaccine at local pharmacy or Health Dept. Aware to provide a copy of the vaccination record if obtained from local pharmacy or Health Dept. Verbalized acceptance and understanding.  Pneumococcal vaccine status: Up to date  Covid-19 vaccine status: Completed vaccines  Qualifies for Shingles Vaccine? Yes   Zostavax completed No   Shingrix Completed?: No.    Education has been provided regarding the importance of this vaccine. Patient has been advised to call insurance company to determine out of pocket expense if they have not yet received this vaccine. Advised may also receive vaccine at local pharmacy or Health Dept. Verbalized acceptance and understanding.  Screening Tests Health Maintenance  Topic Date Due   Zoster Vaccines- Shingrix (1 of 2) Never done   COLONOSCOPY (Pts 45-87yrs Insurance coverage will need to be confirmed)  01/19/2020   COVID-19 Vaccine (4 - Booster for Pfizer series) 02/12/2020   INFLUENZA VACCINE  08/14/2020   TETANUS/TDAP  08/25/2021   Hepatitis C Screening  Completed   HPV VACCINES  Aged Out    Health  Maintenance  Health Maintenance Due  Topic Date Due   Zoster Vaccines- Shingrix (1 of 2) Never done   COLONOSCOPY (Pts 45-10yrs Insurance coverage will need to be  confirmed)  01/19/2020   COVID-19 Vaccine (4 - Booster for Pfizer series) 02/12/2020   INFLUENZA VACCINE  08/14/2020    Colorectal cancer screening: Type of screening: Colonoscopy. Completed 11/08/2020 scheduled . Repeat every 0 years  Lung Cancer Screening: (Low Dose CT Chest recommended if Age 20-80 years, 30 pack-year currently smoking OR have quit w/in 15years.) does not qualify.   Lung Cancer Screening Referral: n/a  Additional Screening:  Hepatitis C Screening: does not qualify; Completed 11/08/2020  Vision Screening: Recommended annual ophthalmology exams for early detection of glaucoma and other disorders of the eye. Is the patient up to date with their annual eye exam?  Yes  Who is the provider or what is the name of the office in which the patient attends annual eye exams? Dr.Dunn If pt is not established with a provider, would they like to be referred to a provider to establish care? No .   Dental Screening: Recommended annual dental exams for proper oral hygiene  Community Resource Referral / Chronic Care Management: CRR required this visit?  No   CCM required this visit?  No      Plan:     I have personally reviewed and noted the following in the patient's chart:   Medical and social history Use of alcohol, tobacco or illicit drugs  Current medications and supplements including opioid prescriptions. Patient is not currently taking opioid prescriptions. Functional ability and status Nutritional status Physical activity Advanced directives List of other physicians Hospitalizations, surgeries, and ER visits in previous 12 months Vitals Screenings to include cognitive, depression, and falls Referrals and appointments  In addition, I have reviewed and discussed with patient certain preventive  protocols, quality metrics, and best practice recommendations. A written personalized care plan for preventive services as well as general preventive health recommendations were provided to patient.     Randel Pigg, LPN   [id number]   Nurse Notes: none

## 2020-11-08 ENCOUNTER — Ambulatory Visit (AMBULATORY_SURGERY_CENTER): Payer: Medicare HMO | Admitting: Gastroenterology

## 2020-11-08 ENCOUNTER — Encounter: Payer: Self-pay | Admitting: Gastroenterology

## 2020-11-08 ENCOUNTER — Other Ambulatory Visit: Payer: Self-pay

## 2020-11-08 ENCOUNTER — Telehealth: Payer: Self-pay | Admitting: Pharmacist

## 2020-11-08 VITALS — BP 137/72 | HR 80 | Temp 97.8°F | Resp 15 | Ht 70.5 in | Wt 180.0 lb

## 2020-11-08 DIAGNOSIS — Z86010 Personal history of colonic polyps: Secondary | ICD-10-CM | POA: Diagnosis not present

## 2020-11-08 DIAGNOSIS — I10 Essential (primary) hypertension: Secondary | ICD-10-CM | POA: Diagnosis not present

## 2020-11-08 DIAGNOSIS — D123 Benign neoplasm of transverse colon: Secondary | ICD-10-CM | POA: Diagnosis not present

## 2020-11-08 DIAGNOSIS — Z800 Family history of malignant neoplasm of digestive organs: Secondary | ICD-10-CM

## 2020-11-08 MED ORDER — SODIUM CHLORIDE 0.9 % IV SOLN: 500.0000 mL | Freq: Once | INTRAVENOUS | Status: DC

## 2020-11-08 NOTE — Progress Notes (Signed)
Report given to PACU, vss 

## 2020-11-08 NOTE — Progress Notes (Signed)
1335 Robinul 0.2 mg IV given due large amount of secretions upon assessment.   Pt experienced laryngeal spasm with jaw thrust performed. vss

## 2020-11-08 NOTE — Patient Instructions (Signed)
Handouts on hemorrhoids, polyps, and diverticulosis given to patient. Await pathology results. No repeat colonoscopy due to age.  Resume previous diet and continue present medications.   YOU HAD AN ENDOSCOPIC PROCEDURE TODAY AT [facility]:   Refer to the procedure report that was given to you for any specific questions about what was found during the examination.  If the procedure report does not answer your questions, please call your gastroenterologist to clarify.  If you requested that your care partner not be given the details of your procedure findings, then the procedure report has been included in a sealed envelope for you to review at your convenience later.  YOU SHOULD EXPECT: Some feelings of bloating in the abdomen. Passage of more gas than usual.  Walking can help get rid of the air that was put into your GI tract during the procedure and reduce the bloating. If you had a lower endoscopy (such as a colonoscopy or flexible sigmoidoscopy) you may notice spotting of blood in your stool or on the toilet paper. If you underwent a bowel prep for your procedure, you may not have a normal bowel movement for a few days.  Please Note:  You might notice some irritation and congestion in your nose or some drainage.  This is from the oxygen used during your procedure.  There is no need for concern and it should clear up in a day or so.  SYMPTOMS TO REPORT IMMEDIATELY:  Following lower endoscopy (colonoscopy or flexible sigmoidoscopy):  Excessive amounts of blood in the stool  Significant tenderness or worsening of abdominal pains  Swelling of the abdomen that is new, acute  Fever of 100F or higher  For urgent or emergent issues, a gastroenterologist can be reached at any hour by calling [phone]. Do not use MyChart messaging for urgent concerns.    DIET:  We do recommend a small meal at first, but then you may proceed to your regular diet.  Drink plenty of fluids but  you should avoid alcoholic beverages for 24 hours.  ACTIVITY:  You should plan to take it easy for the rest of today and you should NOT DRIVE or use heavy machinery until tomorrow (because of the sedation medicines used during the test).    FOLLOW UP: Our staff will call the number listed on your records 48-72 hours following your procedure to check on you and address any questions or concerns that you may have regarding the information given to you following your procedure. If we do not reach you, we will leave a message.  We will attempt to reach you two times.  During this call, we will ask if you have developed any symptoms of COVID 19. If you develop any symptoms (ie: fever, flu-like symptoms, shortness of breath, cough etc.) before then, please call [phone].  If you test positive for Covid 19 in the 2 weeks post procedure, please call and report this information to [place].    If any biopsies were taken you will be contacted by phone or by letter within the next 1-3 weeks.  Please call us at [phone] if you have not heard about the biopsies in 3 weeks.    SIGNATURES/CONFIDENTIALITY: You and/or your care partner have signed paperwork which will be entered into your electronic medical record.  These signatures attest to the fact that that the information above on your After Visit Summary has been reviewed and is understood.  Full responsibility of the confidentiality of this  discharge information lies with you and/or your care-partner.  

## 2020-11-08 NOTE — Progress Notes (Signed)
History & Physical  Primary Care Physician:  [name], NP Primary Gastroenterologist: Lucio Edward, MD  CHIEF COMPLAINT: Family history of colon cancer.  Personal history of colon polyps   HPI: Noah King is a 75 y.o. male with a personal history of adenomatous colon polyps and a family history of colon cancer in a first-degree relative for colonoscopy.    Past Medical History:  Diagnosis Date   Allergy    Cataract    left  extraction- right eye forming   Dupuytren's contracture    GERD (gastroesophageal reflux disease)    Hiatal hernia    past hx-  suspect at one time   Hypertension    Polycythemia     Past Surgical History:  Procedure Laterality Date   cataract extraction left eye     COLONOSCOPY     DUPUYTREN CONTRACTURE RELEASE Left    POLYPECTOMY     rt knee arthroscopic      Prior to Admission medications   Medication Sig Start Date End Date Taking? Authorizing Provider  aspirin 81 MG tablet Take 81 mg by mouth daily.   Yes [provider]  atorvastatin (LIPITOR) 20 MG tablet Take 1 tablet (20 mg total) by mouth daily. 03/01/20  Yes Nafziger, [name], NP  losartan (COZAAR) 25 MG tablet Take 1 tablet (25 mg total) by mouth daily. 03/22/20 11/08/20 Yes Nafziger, [name], NP  Multiple Vitamin (MULTIVITAMIN) tablet Take 1 tablet by mouth daily.   Yes [provider]  omeprazole (PRILOSEC) 20 MG capsule TAKE ONE CAPSULE BY MOUTH DAILY 09/25/20  Yes Nafziger, [name], NP  sildenafil (REVATIO) 20 MG tablet Take 1 tablet (20 mg total) by mouth daily as needed. 12/08/19  Yes Nafziger, [name], NP  verapamil (CALAN-SR) 180 MG CR tablet Take 1 tablet every morning along with verapamil 240 mg. Patient taking differently: Take 1 tablet every morning 12/08/19  Yes Nafziger, [name], NP    Current Outpatient Medications  Medication Sig Dispense Refill   aspirin 81 MG tablet Take 81 mg by mouth daily.     atorvastatin (LIPITOR) 20 MG tablet Take 1 tablet (20 mg  total) by mouth daily. 90 tablet 3   losartan (COZAAR) 25 MG tablet Take 1 tablet (25 mg total) by mouth daily. 90 tablet 3   Multiple Vitamin (MULTIVITAMIN) tablet Take 1 tablet by mouth daily.     omeprazole (PRILOSEC) 20 MG capsule TAKE ONE CAPSULE BY MOUTH DAILY 90 capsule 0   sildenafil (REVATIO) 20 MG tablet Take 1 tablet (20 mg total) by mouth daily as needed. 30 tablet 3   verapamil (CALAN-SR) 180 MG CR tablet Take 1 tablet every morning along with verapamil 240 mg. (Patient taking differently: Take 1 tablet every morning) 90 tablet 3   Current Facility-Administered Medications  Medication Dose Route Frequency Provider Last Rate Last Admin   0.9 %  sodium chloride infusion  500 mL Intravenous Once Ladene Artist, MD        Allergies as of 11/08/2020 - Review Complete 11/08/2020  Allergen Reaction Noted   Lotensin [benazepril hcl] Cough 11/27/2017    Family History  Problem Relation Age of Onset   Stroke Mother    Colon cancer Father    Breast cancer Sister    Cancer Sister        breast    Colon polyps Neg Hx    Esophageal cancer Neg Hx     Social History   Socioeconomic History   Marital status:  Single    Spouse name: Not on file   Number of children: Not on file   Years of education: Not on file   Highest education level: Not on file  Occupational History   Not on file  Tobacco Use   Smoking status: Former    Types: Cigarettes    Quit date: 01/14/1985    Years since quitting: 35.8   Smokeless tobacco: Never  Vaping Use   Vaping Use: Never used  Substance and Sexual Activity   Alcohol use: Yes    Alcohol/week: 0.0 standard drinks    Comment: beer daily    Drug use: No   Sexual activity: Yes    Partners: Female  Other Topics Concern   Not on file  Social History Narrative   Retired- He worked at [employer]   Divorced   One daughter who lives in [place] Strain: Low Risk    Difficulty of Paying Living  Expenses: Not hard at all  Food Insecurity: No Food Insecurity   Worried About Charity fundraiser in the Last Year: Never true   Arboriculturist in the Last Year: Never true  Transportation Needs: No Transportation Needs   Lack of Transportation (Medical): No   Lack of Transportation (Non-Medical): No  Physical Activity: Sufficiently Active   Days of Exercise per Week: 5 days   Minutes of Exercise per Session: 30 min  Stress: No Stress Concern Present   Feeling of Stress : Not at all  Social Connections: Socially Isolated   Frequency of Communication with Friends and Family: Twice a week   Frequency of Social Gatherings with Friends and Family: Twice a week   Attends Religious Services: Never   Marine scientist or Organizations: No   Attends Music therapist: Never   Marital Status: Divorced  Human resources officer Violence: Not At Risk   Fear of Current or Ex-Partner: No   Emotionally Abused: No   Physically Abused: No   Sexually Abused: No    Review of Systems:  All systems reviewed an negative except where noted in HPI.  Gen: Denies any fever, chills, sweats, anorexia, fatigue, weakness, malaise, weight loss, and sleep disorder CV: Denies chest pain, angina, palpitations, syncope, orthopnea, PND, peripheral edema, and claudication. Resp: Denies dyspnea at rest, dyspnea with exercise, cough, sputum, wheezing, coughing up blood, and pleurisy. GI: Denies vomiting blood, jaundice, and fecal incontinence.   Denies dysphagia or odynophagia. GU : Denies urinary burning, blood in urine, urinary frequency, urinary hesitancy, nocturnal urination, and urinary incontinence. MS: Denies joint pain, limitation of movement, and swelling, stiffness, low back pain, extremity pain. Denies muscle weakness, cramps, atrophy.  Derm: Denies rash, itching, dry skin, hives, moles, warts, or unhealing ulcers.  Psych: Denies depression, anxiety, memory loss, suicidal ideation,  hallucinations, paranoia, and confusion. Heme: Denies bruising, bleeding, and enlarged lymph nodes. Neuro:  Denies any headaches, dizziness, paresthesias. Endo:  Denies any problems with DM, thyroid, adrenal function.   Physical Exam: General:  Alert, well-developed, in NAD Head:  Normocephalic and atraumatic. Eyes:  Sclera clear, no icterus.   Conjunctiva pink. Ears:  Normal auditory acuity. Mouth:  No deformity or lesions.  Neck:  Supple; no masses . Lungs:  Clear throughout to auscultation.   No wheezes, crackles, or rhonchi. No acute distress. Heart:  Regular rate and rhythm; no murmurs. Abdomen:  Soft, nondistended, nontender. No masses, hepatomegaly. No obvious masses.  Normal  bowel .    Rectal:  Deferred   Msk:  Symmetrical without gross deformities.. Pulses:  Normal pulses noted. Extremities:  Without edema. Neurologic:  Alert and  oriented x4;  grossly normal neurologically. Skin:  Intact without significant lesions or rashes. Cervical Nodes:  No significant cervical adenopathy. Psych:  Alert and cooperative. Normal mood and affect.   Impression / Plan:   Personal history of adenomatous colon polyps, family history of colon cancer for colonoscopy.    This patient is appropriate for endoscopic procedures in the ambulatory setting.    Pricilla Riffle. Fuller Plan  11/08/2020, 1:30 PM See Shea Evans, [facility], to contact our on call provider

## 2020-11-08 NOTE — Op Note (Signed)
[facility] Patient Name: Noah King Procedure Date: 11/08/2020 1:17 PM MRN: [id number] Endoscopist: Ladene Artist , MD Age: 75 Referring MD:  Date of Birth: [birth year] Gender: Male Account #: [id number] Procedure:                Colonoscopy Indications:              Surveillance: Personal history of adenomatous                            polyps on last colonoscopy 5 years ago. Family                            history of colon cancer, first degree relative. Medicines:                Monitored Anesthesia Care Procedure:                Pre-Anesthesia Assessment:                           - Prior to the procedure, a History and Physical                            was performed, and patient medications and                            allergies were reviewed. The patient's tolerance of                            previous anesthesia was also reviewed. The risks                            and benefits of the procedure and the sedation                            options and risks were discussed with the patient.                            All questions were answered, and informed consent                            was obtained. Prior Anticoagulants: The patient has                            taken no previous anticoagulant or antiplatelet                            agents. ASA Grade Assessment: II - A patient with                            mild systemic disease. After reviewing the risks                            and benefits, the patient was deemed in  satisfactory condition to undergo the procedure.                           After obtaining informed consent, the colonoscope                            was passed under direct vision. Throughout the                            procedure, the patient's blood pressure, pulse, and                            oxygen saturations were monitored continuously. The                            CF [id number]  #[id number] was introduced through the anus                            and advanced to the the cecum, identified by                            appendiceal orifice and ileocecal valve. The                            ileocecal valve, appendiceal orifice, and rectum                            were photographed. The quality of the bowel                            preparation was good. The colonoscopy was somewhat                            difficult due to significant looping and a tortuous                            colon. The patient tolerated the procedure well. Scope In: 1:33:39 PM Scope Out: 1:51:10 PM Scope Withdrawal Time: 0 hours 11 minutes 47 seconds  Total Procedure Duration: 0 hours 17 minutes 31 seconds  Findings:                 The perianal and digital rectal examinations were                            normal.                           A 6 mm polyp was found in the transverse colon. The                            polyp was sessile. The polyp was removed with a                            cold snare. Resection and retrieval were complete.  Scattered medium-mouthed and small-mouthed                            diverticula were found in the entire colon. There                            was no evidence of diverticular bleeding.                           Internal hemorrhoids were found during                            retroflexion. The hemorrhoids were small and Grade                            I (internal hemorrhoids that do not prolapse).                           The exam was otherwise without abnormality on                            direct and retroflexion views. Complications:            No immediate complications. Estimated blood loss:                            None. Estimated Blood Loss:     Estimated blood loss: none. Impression:               - One 6 mm polyp in the transverse colon, removed                            with a cold snare. Resected and  retrieved.                           - Mild diverticulosis in the entire examined colon.                           - Internal hemorrhoids.                           - The examination was otherwise normal on direct                            and retroflexion views. Recommendation:           - Patient has a contact number available for                            emergencies. The signs and symptoms of potential                            delayed complications were discussed with the                            patient. Return to normal activities tomorrow.  Written discharge instructions were provided to the                            patient.                           - Resume previous diet.                           - Continue present medications.                           - Await pathology results.                           - No repeat colonoscopy due to age. Ladene Artist, MD 11/08/2020 1:55:38 PM This report has been signed electronically.

## 2020-11-08 NOTE — Chronic Care Management (AMB) (Signed)
Chronic Care Management Pharmacy Assistant   Name: Noah King  MRN: [id number] DOB: 02/23/1945  Reason for Encounter: Disease State / Hypertension Assessment Call    Conditions to be addressed/monitored: HTN  Recent office visits:  10/31/20 Randel Pigg, LPN - Patient presented for Medicare Annual Wellness Exam. No medication changes.  Recent consult visits:  10/26/20 Steva Ready, RN - Patient presented for a personal history of colonic polyps and other concerns. Prescribed NULYTELY 4000 mls one dose.   Hospital visits:  None in previous 6 months  Medications: Outpatient Encounter Medications as of 11/08/2020  Medication Sig Note   amoxicillin (AMOXIL) 500 MG capsule Take 500 mg by mouth 3 (three) times daily.    aspirin 81 MG tablet Take 81 mg by mouth daily.    atorvastatin (LIPITOR) 20 MG tablet Take 1 tablet (20 mg total) by mouth daily.    losartan (COZAAR) 25 MG tablet Take 1 tablet (25 mg total) by mouth daily.    Multiple Vitamin (MULTIVITAMIN) tablet Take 1 tablet by mouth daily.    omeprazole (PRILOSEC) 20 MG capsule TAKE ONE CAPSULE BY MOUTH DAILY    sildenafil (REVATIO) 20 MG tablet Take 1 tablet (20 mg total) by mouth daily as needed.    verapamil (CALAN-SR) 180 MG CR tablet Take 1 tablet every morning along with verapamil 240 mg. (Patient taking differently: Take 1 tablet every morning) 10/31/2020: Patient taking 180mg  daily per patient    No facility-administered encounter medications on file as of 11/08/2020.  Reviewed chart prior to disease state call. Spoke with patient regarding BP  Recent Office Vitals: BP Readings from Last 3 Encounters:  03/22/20 126/82  12/24/19 136/79  12/08/19 138/82   Pulse Readings from Last 3 Encounters:  03/22/20 74  12/24/19 90  12/08/19 77    Wt Readings from Last 3 Encounters:  10/26/20 180 lb (81.6 kg)  03/22/20 178 lb (80.7 kg)  12/24/19 176 lb (79.8 kg)     Kidney Function Lab Results   Component Value Date/Time   CREATININE 0.78 12/08/2019 08:19 AM   CREATININE 0.81 12/02/2018 07:27 AM   CREATININE 0.85 11/13/2017 07:34 AM   GFR 93.12 12/02/2018 07:27 AM   GFRNONAA 88 12/08/2019 08:19 AM   GFRAA 102 12/08/2019 08:19 AM    BMP Latest Ref Rng & Units 12/08/2019 12/02/2018 11/13/2017  Glucose 65 - 99 mg/dL 97 112(H) 94  BUN 7 - 25 mg/dL 8 9 8   Creatinine 0.70 - 1.18 mg/dL 0.78 0.81 0.85  BUN/Creat Ratio 6 - 22 (calc) NOT APPLICABLE - -  Sodium 409 - 146 mmol/L 140 140 140  Potassium 3.5 - 5.3 mmol/L 4.7 4.1 4.5  Chloride 98 - 110 mmol/L 102 103 103  CO2 20 - 32 mmol/L 29 28 31   Calcium 8.6 - 10.3 mg/dL 9.6 9.5 9.4    Current antihypertensive regimen:  Losartan 25 mg - taking in the morning How often are you checking your Blood Pressure? weekly Current home BP readings: Patient reports last 3 readings as follows : 128/70, 133/75, 154/78 patient reports he has not noticed any thing out of the ordinary on the days when it has exceeded 140 reports he has been having some dental work and no stress in his life or family at this time. What recent interventions/DTPs have been made by any provider to improve Blood Pressure control since last CPP Visit: Patient reports no changes  Any recent hospitalizations or ED visits since last visit with CPP?  None What diet changes have been made to improve Blood Pressure Control?  Patient reports he is still making an effort to watch the sodium intake on his meals, eggs, fruit and toast for breakfast, rinsed canned veggies for lunch and dinner and every so often he will eat out. What exercise is being done to improve your Blood Pressure Control?  Patient reports he does lower body exercises as assigned to him by a physical therapist as well as keeping up with his walking.  Adherence Review: Is the patient currently on ACE/ARB medication? Yes Does the patient have >5 day gap between last estimated fill dates? No  Advised patient of  upcoming follow up appointment for BP with PCP and that he should bring his log book and cuff to appointment. Also if he gets a higher reading make a note of any changes in routine/time BP checked for that day. He was in agreement.  Care Gaps: Zoster Vaccine - Overdue COVID Booster #4 Therapist, music) - Overdue Colonoscopy - Overdue Flu Vaccine - Overdue CCM- 1/23 BP-128/70 (home)  Star Rating Drugs: Losartan (Cozaar) 25 mg - Last filled 09/21/20 90 DS at [facility] Atorvastatin (Lipitor) 20 mg - Last filled 08/27/20 90 DS at [facility] Pharmacist Assistant [phone]

## 2020-11-08 NOTE — Progress Notes (Signed)
Called to room to assist during endoscopic procedure.  Patient ID and intended procedure confirmed with present staff. Received instructions for my participation in the procedure from the performing physician.  

## 2020-11-08 NOTE — Progress Notes (Signed)
Pt's states no medical or surgical changes since previsit or office visit. 

## 2020-11-09 ENCOUNTER — Encounter: Payer: Medicare HMO | Admitting: Gastroenterology

## 2020-11-10 ENCOUNTER — Telehealth: Payer: Self-pay

## 2020-11-10 NOTE — Telephone Encounter (Signed)
  Follow up Call-  Call back number 11/08/2020  Post procedure Call Back phone  # [phone]  Permission to leave phone message Yes  Some recent data might be hidden     Patient questions:  Do you have a fever, pain , or abdominal swelling? No. Pain Score  0 *  Have you tolerated food without any problems? Yes.    Have you been able to return to your normal activities? Yes.    Do you have any questions about your discharge instructions: Diet   No. Medications  No. Follow up visit  No.  Do you have questions or concerns about your Care? No.  Actions: * If pain score is 4 or above: No action needed, pain <4.

## 2020-11-22 ENCOUNTER — Encounter: Payer: Self-pay | Admitting: Gastroenterology

## 2020-12-12 ENCOUNTER — Ambulatory Visit (INDEPENDENT_AMBULATORY_CARE_PROVIDER_SITE_OTHER): Payer: Medicare HMO | Admitting: Adult Health

## 2020-12-12 ENCOUNTER — Encounter: Payer: Self-pay | Admitting: Adult Health

## 2020-12-12 ENCOUNTER — Other Ambulatory Visit: Payer: Self-pay | Admitting: Adult Health

## 2020-12-12 VITALS — BP 120/84 | HR 80 | Temp 97.0°F | Ht 70.5 in | Wt 177.0 lb

## 2020-12-12 DIAGNOSIS — Z0000 Encounter for general adult medical examination without abnormal findings: Secondary | ICD-10-CM

## 2020-12-12 DIAGNOSIS — D649 Anemia, unspecified: Secondary | ICD-10-CM

## 2020-12-12 DIAGNOSIS — N529 Male erectile dysfunction, unspecified: Secondary | ICD-10-CM

## 2020-12-12 DIAGNOSIS — I739 Peripheral vascular disease, unspecified: Secondary | ICD-10-CM

## 2020-12-12 DIAGNOSIS — I10 Essential (primary) hypertension: Secondary | ICD-10-CM

## 2020-12-12 DIAGNOSIS — E785 Hyperlipidemia, unspecified: Secondary | ICD-10-CM

## 2020-12-12 DIAGNOSIS — Z125 Encounter for screening for malignant neoplasm of prostate: Secondary | ICD-10-CM

## 2020-12-12 DIAGNOSIS — Z23 Encounter for immunization: Secondary | ICD-10-CM | POA: Diagnosis not present

## 2020-12-12 LAB — CBC WITH DIFFERENTIAL/PLATELET
Basophils Absolute: 0 10*3/uL (ref 0.0–0.1)
Basophils Relative: 0.8 % (ref 0.0–3.0)
Eosinophils Absolute: 0.4 10*3/uL (ref 0.0–0.7)
Eosinophils Relative: 7.7 % — ABNORMAL HIGH (ref 0.0–5.0)
HCT: 32.6 % — ABNORMAL LOW (ref 39.0–52.0)
Hemoglobin: 10.2 g/dL — ABNORMAL LOW (ref 13.0–17.0)
Lymphocytes Relative: 18.5 % (ref 12.0–46.0)
Lymphs Abs: 1 10*3/uL (ref 0.7–4.0)
MCHC: 31.3 g/dL (ref 30.0–36.0)
MCV: 72.4 fl — ABNORMAL LOW (ref 78.0–100.0)
Monocytes Absolute: 0.8 10*3/uL (ref 0.1–1.0)
Monocytes Relative: 14.5 % — ABNORMAL HIGH (ref 3.0–12.0)
Neutro Abs: 3.3 10*3/uL (ref 1.4–7.7)
Neutrophils Relative %: 58.5 % (ref 43.0–77.0)
Platelets: 228 10*3/uL (ref 150.0–400.0)
RBC: 4.5 Mil/uL (ref 4.22–5.81)
RDW: 17.1 % — ABNORMAL HIGH (ref 11.5–15.5)
WBC: 5.6 10*3/uL (ref 4.0–10.5)

## 2020-12-12 LAB — LIPID PANEL
Cholesterol: 113 mg/dL (ref 0–200)
HDL: 53.5 mg/dL (ref >39.00)
LDL Cholesterol: 48 mg/dL (ref 0–99)
NonHDL: 59.23
Total CHOL/HDL Ratio: 2
Triglycerides: 55 mg/dL (ref 0.0–149.0)
VLDL: 11 mg/dL (ref 0.0–40.0)

## 2020-12-12 LAB — COMPREHENSIVE METABOLIC PANEL
ALT: 14 U/L (ref 0–53)
AST: 12 U/L (ref 0–37)
Albumin: 4.2 g/dL (ref 3.5–5.2)
Alkaline Phosphatase: 61 U/L (ref 39–117)
BUN: 12 mg/dL (ref 6–23)
CO2: 30 mEq/L (ref 19–32)
Calcium: 9.4 mg/dL (ref 8.4–10.5)
Chloride: 103 mEq/L (ref 96–112)
Creatinine, Ser: 0.9 mg/dL (ref 0.40–1.50)
GFR: 83.17 mL/min (ref >60.00)
Glucose, Bld: 99 mg/dL (ref 70–99)
Potassium: 4.5 mEq/L (ref 3.5–5.1)
Sodium: 140 mEq/L (ref 135–145)
Total Bilirubin: 0.8 mg/dL (ref 0.2–1.2)
Total Protein: 6.9 g/dL (ref 6.0–8.3)

## 2020-12-12 LAB — PSA: PSA: 2.09 ng/mL (ref 0.10–4.00)

## 2020-12-12 LAB — TSH: TSH: 1.57 u[IU]/mL (ref 0.35–5.50)

## 2020-12-12 MED ORDER — VERAPAMIL HCL ER 180 MG PO TBCR: EXTENDED_RELEASE_TABLET | ORAL | 3 refills | Status: DC

## 2020-12-12 MED ORDER — OMEPRAZOLE 20 MG PO CPDR: 20.0000 mg | DELAYED_RELEASE_CAPSULE | Freq: Every day | ORAL | 3 refills | Status: DC

## 2020-12-12 MED ORDER — SILDENAFIL CITRATE 20 MG PO TABS: 20.0000 mg | ORAL_TABLET | Freq: Every day | ORAL | 3 refills | Status: DC | PRN

## 2020-12-12 NOTE — Patient Instructions (Signed)
It was great seeing you today   We will follow up with you regarding your lab work   Please let me know if you need anything   

## 2020-12-12 NOTE — Progress Notes (Signed)
Subjective:    Patient ID: Noah King, male    DOB: 1945-08-16, 75 y.o.   MRN: [id number]  HPI Patient presents for yearly preventative medicine examination. He is a pleasant 75 year old male who  has a past medical history of Allergy, Cataract, Dupuytren's contracture, GERD (gastroesophageal reflux disease), Hiatal hernia, Hypertension, and Polycythemia.  Hypertension -managed with losartan 25 mg daily and verapamil 180 mg daily.  He denies lightheadedness, chest pain, shortness of breath, or syncopal episodes.  He does monitor his blood pressure at home periodically and reports that his BP are usually in the 120's/70's.  BP Readings from Last 3 Encounters:  12/12/20 120/84  11/08/20 137/72  03/22/20 126/82   Hyperlipidemia-currently on Lipitor 20 mg daily.  Denies myalgia or fatigue  ED-he has Viagra as needed  PAD -takes aspirin 81 mg and Lipitor 20 mg daily.  He is due for 1 year vascular follow-up in December 2022.  GERD - takes Prilosec 20 mg daily. Feels well controlled.   All immunizations and health maintenance protocols were reviewed with the patient and needed orders were placed.  Appropriate screening laboratory values were ordered for the patient including screening of hyperlipidemia, renal function and hepatic function.  Medication reconciliation,  past medical history, social history, problem list and allergies were reviewed in detail with the patient  Goals were established with regard to weight loss, exercise, and  diet in compliance with medications  Wt Readings from Last 3 Encounters:  12/12/20 177 lb (80.3 kg)  11/08/20 180 lb (81.6 kg)  10/26/20 180 lb (81.6 kg)    Review of Systems  Constitutional: Negative.   HENT: Negative.    Eyes: Negative.   Respiratory: Negative.    Cardiovascular: Negative.   Gastrointestinal: Negative.   Endocrine: Negative.   Genitourinary: Negative.   Musculoskeletal:  Positive for gait problem (chronic).  Skin:  Negative.   Allergic/Immunologic: Negative.   Hematological: Negative.   Psychiatric/Behavioral: Negative.    All other systems reviewed and are negative.  Past Medical History:  Diagnosis Date   Allergy    Cataract    left  extraction- right eye forming   Dupuytren's contracture    GERD (gastroesophageal reflux disease)    Hiatal hernia    past hx-  suspect at one time   Hypertension    Polycythemia     Social History   Socioeconomic History   Marital status: Single    Spouse name: Not on file   Number of children: Not on file   Years of education: Not on file   Highest education level: Not on file  Occupational History   Not on file  Tobacco Use   Smoking status: Former    Types: Cigarettes    Quit date: 01/14/1985    Years since quitting: 35.9   Smokeless tobacco: Never  Vaping Use   Vaping Use: Never used  Substance and Sexual Activity   Alcohol use: Yes    Alcohol/week: 0.0 standard drinks    Comment: beer daily    Drug use: No   Sexual activity: Yes    Partners: Female  Other Topics Concern   Not on file  Social History Narrative   Retired- He worked at [employer]   Divorced   One daughter who lives in [place] Strain: Low Risk    Difficulty of Paying Living Expenses: Not hard at all  Food Insecurity: No [place]  Worried About Charity fundraiser in the Last Year: Never true   [place] in the Last Year: Never true  Transportation Needs: No Transportation Needs   Lack of Transportation (Medical): No   Lack of Transportation (Non-Medical): No  Physical Activity: Sufficiently Active   Days of Exercise per Week: 5 days   Minutes of Exercise per Session: 30 min  Stress: No Stress Concern Present   Feeling of Stress : Not at all  Social Connections: Socially Isolated   Frequency of Communication with Friends and Family: Twice a week   Frequency of Social Gatherings with Friends and Family:  Twice a week   Attends Religious Services: Never   Printmaker: No   Attends Music therapist: Never   Marital Status: Divorced  Human resources officer Violence: Not At Risk   Fear of Current or Ex-Partner: No   Emotionally Abused: No   Physically Abused: No   Sexually Abused: No    Past Surgical History:  Procedure Laterality Date   cataract extraction left eye     COLONOSCOPY     DUPUYTREN CONTRACTURE RELEASE Left    POLYPECTOMY     rt knee arthroscopic      Family History  Problem Relation Age of Onset   Stroke Mother    Colon cancer Father    Breast cancer Sister    Cancer Sister        breast    Colon polyps Neg Hx    Esophageal cancer Neg Hx     Allergies  Allergen Reactions   Lotensin [Benazepril Hcl] Cough    Current Outpatient Medications on File Prior to Visit  Medication Sig Dispense Refill   aspirin 81 MG tablet Take 81 mg by mouth daily.     atorvastatin (LIPITOR) 20 MG tablet Take 1 tablet (20 mg total) by mouth daily. 90 tablet 3   Multiple Vitamin (MULTIVITAMIN) tablet Take 1 tablet by mouth daily.     omeprazole (PRILOSEC) 20 MG capsule TAKE ONE CAPSULE BY MOUTH DAILY 90 capsule 0   sildenafil (REVATIO) 20 MG tablet Take 1 tablet (20 mg total) by mouth daily as needed. 30 tablet 3   verapamil (CALAN-SR) 180 MG CR tablet Take 1 tablet every morning along with verapamil 240 mg. (Patient taking differently: Take 1 tablet every morning) 90 tablet 3   losartan (COZAAR) 25 MG tablet Take 1 tablet (25 mg total) by mouth daily. 90 tablet 3   No current facility-administered medications on file prior to visit.    BP 120/84   Pulse 80   Temp (!) 97 F (36.1 C) (Temporal)   Ht 5' 10.5" (1.791 m)   Wt 177 lb (80.3 kg)   SpO2 98%   BMI 25.04 kg/m        Objective:   Physical Exam Vitals and nursing note reviewed.  Constitutional:      General: He is not in acute distress.    Appearance: Normal appearance. He is  well-developed and normal weight.  HENT:     Head: Normocephalic and atraumatic.     Right Ear: Tympanic membrane, ear canal and external ear normal. There is no impacted cerumen.     Left Ear: Tympanic membrane, ear canal and external ear normal. There is no impacted cerumen.     Nose: Nose normal. No congestion or rhinorrhea.     Mouth/Throat:     Mouth: Mucous membranes are moist.  Pharynx: Oropharynx is clear. No oropharyngeal exudate or posterior oropharyngeal erythema.  Eyes:     General:        Right eye: No discharge.        Left eye: No discharge.     Extraocular Movements: Extraocular movements intact.     Conjunctiva/sclera: Conjunctivae normal.     Pupils: Pupils are equal, round, and reactive to light.  Neck:     Vascular: No carotid bruit.     Trachea: No tracheal deviation.  Cardiovascular:     Rate and Rhythm: Normal rate and regular rhythm.     Pulses: Normal pulses.     Heart sounds: Normal heart sounds. No murmur heard.   No friction rub. No gallop.  Pulmonary:     Effort: Pulmonary effort is normal. No respiratory distress.     Breath sounds: Normal breath sounds. No stridor. No wheezing, rhonchi or rales.  Chest:     Chest wall: No tenderness.  Abdominal:     General: Bowel sounds are normal. There is no distension.     Palpations: Abdomen is soft. There is no mass.     Tenderness: There is no abdominal tenderness. There is no right CVA tenderness, left CVA tenderness, guarding or rebound.     Hernia: No hernia is present.  Musculoskeletal:        General: No swelling, tenderness, deformity or signs of injury. Normal range of motion.     Right lower leg: No edema.     Left lower leg: No edema.  Lymphadenopathy:     Cervical: No cervical adenopathy.  Skin:    General: Skin is warm and dry.     Capillary Refill: Capillary refill takes less than 2 seconds.     Coloration: Skin is not jaundiced or pale.     Findings: No bruising, erythema, lesion or  rash.  Neurological:     General: No focal deficit present.     Mental Status: He is alert and oriented to person, place, and time.     Cranial Nerves: No cranial nerve deficit.     Sensory: No sensory deficit.     Motor: No weakness.     Coordination: Coordination normal.     Gait: Gait abnormal (slow steady gait).     Deep Tendon Reflexes: Reflexes normal.  Psychiatric:        Mood and Affect: Mood normal.        Behavior: Behavior normal.        Thought Content: Thought content normal.        Judgment: Judgment normal.      Assessment & Plan:  1. Routine general medical examination at a health care facility - Follow up in one year  - Continue to stay active and eat healthy  - CBC with Differential/Platelet; Future - Comprehensive metabolic panel; Future - Lipid panel; Future - TSH; Future  2. Essential hypertension - Well controlled. No change in medications  - CBC with Differential/Platelet; Future - Comprehensive metabolic panel; Future - Lipid panel; Future - TSH; Future - verapamil (CALAN-SR) 180 MG CR tablet; Take 1 tablet every morning  Dispense: 90 tablet; Refill: 3  3. Hyperlipidemia, unspecified hyperlipidemia type - Consider increase in statin  - CBC with Differential/Platelet; Future - Comprehensive metabolic panel; Future - Lipid panel; Future - TSH; Future  4. Erectile dysfunction, unspecified erectile dysfunction type  - sildenafil (REVATIO) 20 MG tablet; Take 1 tablet (20 mg total) by mouth daily as needed.  Dispense: 30 tablet; Refill: 3  5. PAD (peripheral artery disease) (HCC) - Continue with statin and ASA - Follow up with vein and vascular as directed - CBC with Differential/Platelet; Future - Comprehensive metabolic panel; Future - Lipid panel; Future - TSH; Future  6. Need for immunization against influenza  - Flu Vaccine QUAD 39mo+IM (Fluarix, Fluzone & Alfiuria Quad PF)  7. Prostate cancer screening  - PSA; Future  [name],  NP

## 2020-12-15 ENCOUNTER — Telehealth: Payer: Self-pay | Admitting: Adult Health

## 2020-12-15 NOTE — Telephone Encounter (Signed)
Pt is calling and would like blood work results 

## 2020-12-15 NOTE — Telephone Encounter (Signed)
See result note.  

## 2020-12-19 ENCOUNTER — Telehealth: Payer: Self-pay | Admitting: Pharmacist

## 2020-12-19 NOTE — Chronic Care Management (AMB) (Signed)
Chronic Care Management Pharmacy Assistant   Name: Noah King  MRN: [id number] DOB: 13-Nov-1945  Reason for Encounter: Disease State / Hypertension Assessment Call    Conditions to be addressed/monitored: HTN  Recent office visits:  12/12/20 [name], NP - Patient presented for Routine general medical examination and other concerns. Decreased Verapamil to 180 mg every morning.   Recent consult visits:  11/08/20 Jaynie Bream) - Patient presented for Colonoscopy   Hospital visits:  None in previous 6 months  Medications: Outpatient Encounter Medications as of 12/19/2020  Medication Sig   aspirin 81 MG tablet Take 81 mg by mouth daily.   atorvastatin (LIPITOR) 20 MG tablet Take 1 tablet (20 mg total) by mouth daily.   losartan (COZAAR) 25 MG tablet Take 1 tablet (25 mg total) by mouth daily.   Multiple Vitamin (MULTIVITAMIN) tablet Take 1 tablet by mouth daily.   omeprazole (PRILOSEC) 20 MG capsule Take 1 capsule (20 mg total) by mouth daily.   sildenafil (REVATIO) 20 MG tablet Take 1 tablet (20 mg total) by mouth daily as needed.   verapamil (CALAN-SR) 180 MG CR tablet Take 1 tablet every morning   No facility-administered encounter medications on file as of 12/19/2020.  Reviewed chart prior to disease state call. Spoke with patient regarding BP  Recent Office Vitals: BP Readings from Last 3 Encounters:  12/12/20 120/84  11/08/20 137/72  03/22/20 126/82   Pulse Readings from Last 3 Encounters:  12/12/20 80  11/08/20 80  03/22/20 74    Wt Readings from Last 3 Encounters:  12/12/20 177 lb (80.3 kg)  11/08/20 180 lb (81.6 kg)  10/26/20 180 lb (81.6 kg)     Kidney Function Lab Results  Component Value Date/Time   CREATININE 0.90 12/12/2020 08:30 AM   CREATININE 0.78 12/08/2019 08:19 AM   CREATININE 0.81 12/02/2018 07:27 AM   GFR 83.17 12/12/2020 08:30 AM   GFRNONAA 88 12/08/2019 08:19 AM   GFRAA 102 12/08/2019 08:19 AM    BMP Latest  Ref Rng & Units 12/12/2020 12/08/2019 12/02/2018  Glucose 70 - 99 mg/dL 99 97 112(H)  BUN 6 - 23 mg/dL 12 8 9   Creatinine 0.40 - 1.50 mg/dL 0.90 0.78 0.81  BUN/Creat Ratio 6 - 22 (calc) - NOT APPLICABLE -  Sodium 408 - 145 mEq/L 140 140 140  Potassium 3.5 - 5.1 mEq/L 4.5 4.7 4.1  Chloride 96 - 112 mEq/L 103 102 103  CO2 19 - 32 mEq/L 30 29 28   Calcium 8.4 - 10.5 mg/dL 9.4 9.6 9.5    Current antihypertensive regimen:  Verapamil 180 mg 1 tablet daily  Benazepril 10 mg 1 tablet daily  How often are you checking your Blood Pressure? infrequently Current home BP readings: Patient reports he was just in the office last week and his readings at home are in line with the same as office. 120/84 ( 12/12/20) What recent interventions/DTPs have been made by any provider to improve Blood Pressure control since last CPP Visit: No changes Any recent hospitalizations or ED visits since last visit with CPP? No What diet changes have been made to improve Blood Pressure Control?  Patient reports he is still watching the sodium intake on his meals, eggs, fruit and toast for breakfast, rinsed canned veggies for lunch and dinner and every so often he will eat out What exercise is being done to improve your Blood Pressure Control?  Patient reports he is still doing his walking and PT exercises  Adherence Review: Is the patient currently on ACE/ARB medication? Yes Does the patient have >5 day gap between last estimated fill dates? No    Care Gaps: AWV - 11/08/20 Zoster Vaccine - Overdue BP - 120/84 (12/12/20) CCM -1/23  Star Rating Drugs: Atorvastatin (Lipitor) 20 mg - Last filled 11/22/20 90 DS at [facility] Losartan (Cozaar) 25 mg - Last filled 12/13/20 90 DS at [facility]   Patient Assistance: None  [place] Clinical Pharmacist Assistant [phone]

## 2020-12-26 ENCOUNTER — Other Ambulatory Visit (INDEPENDENT_AMBULATORY_CARE_PROVIDER_SITE_OTHER): Payer: Medicare HMO

## 2020-12-26 DIAGNOSIS — D649 Anemia, unspecified: Secondary | ICD-10-CM

## 2020-12-26 LAB — CBC WITH DIFFERENTIAL/PLATELET
Basophils Absolute: 0.1 10*3/uL (ref 0.0–0.1)
Basophils Relative: 1 % (ref 0.0–3.0)
Eosinophils Absolute: 0.3 10*3/uL (ref 0.0–0.7)
Eosinophils Relative: 4.9 % (ref 0.0–5.0)
HCT: 33 % — ABNORMAL LOW (ref 39.0–52.0)
Hemoglobin: 10.1 g/dL — ABNORMAL LOW (ref 13.0–17.0)
Lymphocytes Relative: 17.5 % (ref 12.0–46.0)
Lymphs Abs: 1.1 10*3/uL (ref 0.7–4.0)
MCHC: 30.6 g/dL (ref 30.0–36.0)
MCV: 72.2 fl — ABNORMAL LOW (ref 78.0–100.0)
Monocytes Absolute: 0.6 10*3/uL (ref 0.1–1.0)
Monocytes Relative: 9.6 % (ref 3.0–12.0)
Neutro Abs: 4.1 10*3/uL (ref 1.4–7.7)
Neutrophils Relative %: 67 % (ref 43.0–77.0)
Platelets: 231 10*3/uL (ref 150.0–400.0)
RBC: 4.57 Mil/uL (ref 4.22–5.81)
RDW: 17.5 % — ABNORMAL HIGH (ref 11.5–15.5)
WBC: 6.1 10*3/uL (ref 4.0–10.5)

## 2020-12-27 ENCOUNTER — Other Ambulatory Visit: Payer: Self-pay | Admitting: Adult Health

## 2020-12-27 DIAGNOSIS — E611 Iron deficiency: Secondary | ICD-10-CM

## 2020-12-27 LAB — IRON AND TIBC
Iron Saturation: 5 % — CL (ref 15–55)
Iron: 20 ug/dL — ABNORMAL LOW (ref 38–169)
Total Iron Binding Capacity: 427 ug/dL (ref 250–450)
UIBC: 407 ug/dL — ABNORMAL HIGH (ref 111–343)

## 2021-01-01 ENCOUNTER — Other Ambulatory Visit: Payer: Self-pay

## 2021-01-01 DIAGNOSIS — I739 Peripheral vascular disease, unspecified: Secondary | ICD-10-CM

## 2021-01-16 ENCOUNTER — Ambulatory Visit: Payer: Medicare HMO | Admitting: Physician Assistant

## 2021-01-16 ENCOUNTER — Ambulatory Visit (HOSPITAL_COMMUNITY)
Admission: RE | Admit: 2021-01-16 | Discharge: 2021-01-16 | Disposition: A | Discharge status: Home / Self Care | Payer: Medicare HMO | Source: Ambulatory Visit | Attending: Vascular Surgery | Admitting: Vascular Surgery

## 2021-01-16 ENCOUNTER — Other Ambulatory Visit: Payer: Self-pay

## 2021-01-16 VITALS — BP 119/60 | HR 65 | Temp 98.1°F | Resp 20 | Ht 71.0 in | Wt 175.0 lb

## 2021-01-16 DIAGNOSIS — I739 Peripheral vascular disease, unspecified: Secondary | ICD-10-CM | POA: Diagnosis not present

## 2021-01-16 NOTE — Progress Notes (Signed)
Office Note     CC:  follow up Requesting Provider:  [name], NP  HPI: Noah King is a 76 y.o. (08-15-1945) male who presents for surveillance of PAD.  Over the past year since last office visit he denies any claudication symptoms, rest pain, or nonhealing wounds of bilateral lower extremities.  He complains of occasional cold feet.  He does have varicose veins however they are not bothersome to him.  He is ambulatory without assistance.  He is a former smoker.  He takes medication for hyperlipidemia and hypertension.   Past Medical History:  Diagnosis Date   Allergy    Cataract    left  extraction- right eye forming   Dupuytren's contracture    GERD (gastroesophageal reflux disease)    Hiatal hernia    past hx-  suspect at one time   Hypertension    Polycythemia     Past Surgical History:  Procedure Laterality Date   cataract extraction left eye     COLONOSCOPY     DUPUYTREN CONTRACTURE RELEASE Left    POLYPECTOMY     rt knee arthroscopic      Social History   Socioeconomic History   Marital status: Single    Spouse name: Not on file   Number of children: Not on file   Years of education: Not on file   Highest education level: Not on file  Occupational History   Not on file  Tobacco Use   Smoking status: Former    Types: Cigarettes    Quit date: 01/14/1985    Years since quitting: 36.0   Smokeless tobacco: Never  Vaping Use   Vaping Use: Never used  Substance and Sexual Activity   Alcohol use: Yes    Alcohol/week: 0.0 standard drinks    Comment: beer daily    Drug use: No   Sexual activity: Yes    Partners: Female  Other Topics Concern   Not on file  Social History Narrative   Retired- He worked at [employer]   Divorced   One daughter who lives in [place] Strain: Low Risk    Difficulty of Paying Living Expenses: Not hard at all  Food Insecurity: No Food Insecurity   Worried About Paediatric nurse in the Last Year: Never true   Arboriculturist in the Last Year: Never true  Transportation Needs: No Transportation Needs   Lack of Transportation (Medical): No   Lack of Transportation (Non-Medical): No  Physical Activity: Sufficiently Active   Days of Exercise per Week: 5 days   Minutes of Exercise per Session: 30 min  Stress: No Stress Concern Present   Feeling of Stress : Not at all  Social Connections: Socially Isolated   Frequency of Communication with Friends and Family: Twice a week   Frequency of Social Gatherings with Friends and Family: Twice a week   Attends Religious Services: Never   Marine scientist or Organizations: No   Attends Music therapist: Never   Marital Status: Divorced  Human resources officer Violence: Not At Risk   Fear of Current or Ex-Partner: No   Emotionally Abused: No   Physically Abused: No   Sexually Abused: No    Family History  Problem Relation Age of Onset   Stroke Mother    Colon cancer Father    Breast cancer Sister    Cancer Sister  breast    Colon polyps Neg Hx    Esophageal cancer Neg Hx     Current Outpatient Medications  Medication Sig Dispense Refill   aspirin 81 MG tablet Take 81 mg by mouth daily.     atorvastatin (LIPITOR) 20 MG tablet Take 1 tablet (20 mg total) by mouth daily. 90 tablet 3   Ferrous Sulfate (IRON) 325 (65 Fe) MG TABS Take by mouth.     Multiple Vitamin (MULTIVITAMIN) tablet Take 1 tablet by mouth daily.     omeprazole (PRILOSEC) 20 MG capsule Take 1 capsule (20 mg total) by mouth daily. 90 capsule 3   sildenafil (REVATIO) 20 MG tablet Take 1 tablet (20 mg total) by mouth daily as needed. 30 tablet 3   verapamil (CALAN-SR) 180 MG CR tablet Take 1 tablet every morning 90 tablet 3   losartan (COZAAR) 25 MG tablet Take 1 tablet (25 mg total) by mouth daily. 90 tablet 3   No current facility-administered medications for this visit.    Allergies  Allergen Reactions   Lotensin  [Benazepril Hcl] Cough     REVIEW OF SYSTEMS:   [X]  denotes positive finding, [ ]  denotes negative finding Cardiac  Comments:  Chest pain or chest pressure:    Shortness of breath upon exertion:    Short of breath when lying flat:    Irregular heart rhythm:        Vascular    Pain in calf, thigh, or hip brought on by ambulation:    Pain in feet at night that wakes you up from your sleep:     Blood clot in your veins:    Leg swelling:         Pulmonary    Oxygen at home:    Productive cough:     Wheezing:         Neurologic    Sudden weakness in arms or legs:     Sudden numbness in arms or legs:     Sudden onset of difficulty speaking or slurred speech:    Temporary loss of vision in one eye:     Problems with dizziness:         Gastrointestinal    Blood in stool:     Vomited blood:         Genitourinary    Burning when urinating:     Blood in urine:        Psychiatric    Major depression:         Hematologic    Bleeding problems:    Problems with blood clotting too easily:        Skin    Rashes or ulcers:        Constitutional    Fever or chills:      PHYSICAL EXAMINATION:  Vitals:   01/16/21 1331  BP: 119/60  Pulse: 65  Resp: 20  Temp: 98.1 F (36.7 C)  TempSrc: Temporal  SpO2: 99%  Weight: 175 lb (79.4 kg)  Height: 5\' 11"  (1.803 m)    General:  WDWN in NAD; vital signs documented above Gait: Not observed HENT: WNL, normocephalic Pulmonary: normal non-labored breathing , without Rales, rhonchi,  wheezing Cardiac: regular HR Abdomen: soft, NT, no masses Skin: without rashes Vascular Exam/Pulses:  Right Left  Radial 2+ (normal) 2+ (normal)  Popliteal 2+ (normal) 2+ (normal)  DP absent absent  PT absent absent   Extremities: Varicose veins along medial lower legs; stasis pigmentation changes right medial  ankle more than left; no open sores bilateral lower extremities Musculoskeletal: no muscle wasting or atrophy  Neurologic: A&O X 3;   No focal weakness or paresthesias are detected Psychiatric:  The pt has Normal affect.   Non-Invasive Vascular Imaging:   ABI/TBI Today's ABI Today's TBI Previous ABI Previous TBI   +-------+-----------+-----------+------------+------------+   Right   1.27        0.52        1.62         0.83           +-------+-----------+-----------+------------+------------+   Left    0.91        0.44        1.12         0.68       ASSESSMENT/PLAN:: 76 y.o. male here for surveillance of PAD  -Bilateral lower extremities well-perfused based on Doppler exam -ABIs suggest tibial vessel disease however patient is without claudication, rest pain, or nonhealing wounds.  No indication for further work-up or intervention at this time -Okay to use light compression if he is bothered by his varicose veins -Continue aspirin and statin daily -Repeat ABIs in 1 year.  Patient will call/return office sooner with any questions or concerns   Dagoberto Ligas, PA-C [facility] [phone]  Clinic MD:   Stanford Breed

## 2021-01-22 ENCOUNTER — Telehealth: Payer: Self-pay | Admitting: Pharmacist

## 2021-01-22 NOTE — Chronic Care Management (AMB) (Signed)
° ° °  Chronic Care Management Pharmacy Assistant   Name: Noah King  MRN: [id number] DOB: 1945-11-26  01/22/21 APPOINTMENT REMINDER   Patient was reminded to have all medications, supplements and any blood glucose and blood pressure readings available for review with [name], Pharm. D, for telephone visit on 01/24/21 at 3.   Care Gaps: AWV - 11/08/20 Zoster Vaccine - Overdue BP - 119/60 (01/16/21) CCM -1/23  Star Rating Drug: Atorvastatin (Lipitor) 20 mg - Last filled 11/22/20 90 DS at [facility] Losartan (Cozaar) 25 mg - Last filled 12/13/20 90 [facility]     Medications: Outpatient Encounter Medications as of 01/22/2021  Medication Sig   aspirin 81 MG tablet Take 81 mg by mouth daily.   atorvastatin (LIPITOR) 20 MG tablet Take 1 tablet (20 mg total) by mouth daily.   Ferrous Sulfate (IRON) 325 (65 Fe) MG TABS Take by mouth.   losartan (COZAAR) 25 MG tablet Take 1 tablet (25 mg total) by mouth daily.   Multiple Vitamin (MULTIVITAMIN) tablet Take 1 tablet by mouth daily.   omeprazole (PRILOSEC) 20 MG capsule Take 1 capsule (20 mg total) by mouth daily.   sildenafil (REVATIO) 20 MG tablet Take 1 tablet (20 mg total) by mouth daily as needed.   verapamil (CALAN-SR) 180 MG CR tablet Take 1 tablet every morning   No facility-administered encounter medications on file as of 01/22/2021.      [place] Clinical Pharmacist Assistant [phone]

## 2021-01-24 ENCOUNTER — Ambulatory Visit (INDEPENDENT_AMBULATORY_CARE_PROVIDER_SITE_OTHER): Payer: Medicare HMO | Admitting: Pharmacist

## 2021-01-24 DIAGNOSIS — I10 Essential (primary) hypertension: Secondary | ICD-10-CM

## 2021-01-24 DIAGNOSIS — E785 Hyperlipidemia, unspecified: Secondary | ICD-10-CM

## 2021-01-24 NOTE — Progress Notes (Signed)
Chronic Care Management Pharmacy Note  01/24/2021 Name:  Noah King MRN:  [id number] DOB:  1945/06/16  Summary: BP at goal < 140/90 LDL at goal <70   Recommendations/Changes made from today's visit: -Recommended bringing BP cuff to next office visit to ensure accuracy -Recommended yoga for balance   Plan: BP assessment in 6 months  Subjective: Noah King is an 76 y.o. year old male who is a primary patient of [name], NP.  The CCM team was consulted for assistance with disease management and care coordination needs.    Engaged with patient by telephone for follow up visit in response to provider referral for pharmacy case management and/or care coordination services.   Consent to Services:  The patient was given information about Chronic Care Management services, agreed to services, and gave verbal consent prior to initiation of services.  Please see initial visit note for detailed documentation.   Patient Care Team: [name], NP as PCP - General (Family Medicine) [name], [facility] as Pharmacist (Pharmacist)  Recent office visits: 12/12/20 [name], NP - Patient presented for Routine general medical examination and other concerns. Decreased Verapamil to 180 mg every morning.   10/31/20 Randel Pigg, LPN: Patient presented for AWV.  Recent consult visits: 01/17/20 Dagoberto Ligas, PA-C (vascular surgery): Patient presented for PAD follow up. Repeat ABIs in one year.  11/08/20 Jaynie Bream) - Patient presented for Colonoscopy.  Hospital visits: None in previous 6 months  Objective:  Lab Results  Component Value Date   CREATININE 0.90 12/12/2020   BUN 12 12/12/2020   GFR 83.17 12/12/2020   GFRNONAA 88 12/08/2019   GFRAA 102 12/08/2019   NA 140 12/12/2020   K 4.5 12/12/2020   CALCIUM 9.4 12/12/2020   CO2 30 12/12/2020    Lab Results  Component Value Date/Time   GFR 83.17 12/12/2020 08:30 AM   GFR 93.12  12/02/2018 07:27 AM    Last diabetic Eye exam: No results found for: HMDIABEYEEXA  Last diabetic Foot exam: No results found for: HMDIABFOOTEX   Lab Results  Component Value Date   CHOL 113 12/12/2020   HDL 53.50 12/12/2020   LDLCALC 48 12/12/2020   TRIG 55.0 12/12/2020   CHOLHDL 2 12/12/2020    Hepatic Function Latest Ref Rng & Units 12/12/2020 12/08/2019 12/02/2018  Total Protein 6.0 - 8.3 g/dL 6.9 7.0 7.1  Albumin 3.5 - 5.2 g/dL 4.2 - 4.4  AST 0 - 37 U/L 12 26 31   ALT 0 - 53 U/L 14 27 35  Alk Phosphatase 39 - 117 U/L 61 - 45  Total Bilirubin 0.2 - 1.2 mg/dL 0.8 1.1 0.8  Bilirubin, Direct 0.0 - 0.3 mg/dL - - -    Lab Results  Component Value Date/Time   TSH 1.57 12/12/2020 08:30 AM   TSH 1.57 12/08/2019 08:19 AM    CBC Latest Ref Rng & Units 12/26/2020 12/12/2020 12/08/2019  WBC 4.0 - 10.5 K/uL 6.1 5.6 6.0  Hemoglobin 13.0 - 17.0 g/dL 10.1(L) 10.2(L) 14.7  Hematocrit 39.0 - 52.0 % 33.0(L) 32.6(L) 47.6  Platelets 150.0 - 400.0 K/uL 231.0 228.0 210    No results found for: VD25OH  Clinical ASCVD: No  The ASCVD Risk score (Arnett DK, et al., 2019) failed to calculate for the following reasons:   The valid total cholesterol range is 130 to 320 mg/dL    Depression screen [facility] 2/9 12/12/2020 10/31/2020 12/14/2019  Decreased Interest 0 0 0  Down, Depressed, Hopeless  0 0 0  PHQ - 2 Score 0 0 0  Altered sleeping 2 - 0  Tired, decreased energy 2 - 0  Change in appetite 1 - 0  Feeling bad or failure about yourself  0 - 0  Trouble concentrating 0 - 0  Moving slowly or fidgety/restless 0 - 0  Suicidal thoughts 0 - 0  PHQ-9 Score 5 - 0  Difficult doing work/chores Not difficult at all - Not difficult at all     Social History   Tobacco Use  Smoking Status Former   Types: Cigarettes   Quit date: 01/14/1985   Years since quitting: 36.0  Smokeless Tobacco Never   BP Readings from Last 3 Encounters:  01/16/21 119/60  12/12/20 120/84  11/08/20 137/72   Pulse  Readings from Last 3 Encounters:  01/16/21 65  12/12/20 80  11/08/20 80   Wt Readings from Last 3 Encounters:  01/16/21 175 lb (79.4 kg)  12/12/20 177 lb (80.3 kg)  11/08/20 180 lb (81.6 kg)    Assessment/Interventions: Review of patient past medical history, allergies, medications, health status, including review of consultants reports, laboratory and other test data, was performed as part of comprehensive evaluation and provision of chronic care management services.   SDOH:  (Social Determinants of Health) assessments and interventions performed: No      CCM Care Plan  Allergies  Allergen Reactions   Lotensin [Benazepril Hcl] Cough    Medications Reviewed Today     Reviewed by Iline Oven (Physician Assistant Certified) on 84/53/64 at 1411  Med List Status: <None>   Medication Order Taking? Sig Documenting Provider Last Dose Status Informant  aspirin 81 MG tablet [id number] Yes Take 81 mg by mouth daily. [provider] Taking Active   atorvastatin (LIPITOR) 20 MG tablet [id number] Yes Take 1 tablet (20 mg total) by mouth daily. Nafziger, [name], NP Taking Active   Ferrous Sulfate (IRON) 325 (65 Fe) MG TABS [id number] Yes Take by mouth. [provider]  Active   losartan (COZAAR) 25 MG tablet [id number]  Take 1 tablet (25 mg total) by mouth daily. [name], NP  Expired 11/08/20 2359   Multiple Vitamin (MULTIVITAMIN) tablet [id number] Yes Take 1 tablet by mouth daily. [provider] Taking Active   omeprazole (PRILOSEC) 20 MG capsule [id number] Yes Take 1 capsule (20 mg total) by mouth daily. Nafziger, [name], NP Taking Active   sildenafil (REVATIO) 20 MG tablet [id number] Yes Take 1 tablet (20 mg total) by mouth daily as needed. Nafziger, [name], NP Taking Active   verapamil (CALAN-SR) 180 MG CR tablet [id number] Yes Take 1 tablet every morning [name], NP Taking Active             Patient Active Problem List   Diagnosis Date Noted    HERPES ZOSTER 12/13/2009   POLYCYTHEMIA RUBRA VERA 04/16/2007   Contracture of palmar fascia 04/16/2007   Hypothyroidism 08/29/2006   Hyperlipidemia 08/29/2006   Essential hypertension 08/29/2006    Immunization History  Administered Date(s) Administered   Fluad Quad(high Dose 65+) 12/02/2018, 12/12/2020   Influenza Split 11/27/2010, 12/19/2011   Influenza Whole 10/20/2008, 11/23/2009   Influenza, High Dose Seasonal PF 11/09/2014, 11/09/2015, 11/12/2016, 11/13/2017, 10/13/2019   Influenza,inj,Quad PF,6+ Mos 12/12/2020   Influenza-Unspecified 10/30/2012, 10/26/2013   PFIZER(Purple Top)SARS-COV-2 Vaccination 02/27/2019, 03/22/2019, 10/13/2019   Pfizer Covid-19 Vaccine Bivalent Booster 75yr & up 06/22/2020, 01/23/2021   Pneumococcal Conjugate-13 05/04/2014   Pneumococcal Polysaccharide-23 03/05/2013   Td 07/14/2000   Tdap 08/26/2011  Patient drinks about a bottle of water with medications and then doesn't drink much water throughout the rest of the day.  Patient is having issues with walking and balance and vein and vascular recommended more walking. He recommended an exercise machine in his home. He has been looking into buying an exercise machine. Recommended yoga.   Conditions to be addressed/monitored:  Hypertension, Hyperlipidemia, Hypothyroidism and ED and chronic cough  Conditions addressed this visit: Hypertension, hyperlipidemia  Care Plan : CCM Pharmacy care plan  Updates made by [name], [place] since 01/24/2021 12:00 AM     Problem: Problem: Hypertension, hyperlipidemia, hypothyroidism, chronic cough, and ED      Long-Range Goal: Care plan   Start Date: 02/23/2020  Expected End Date: 02/22/2021  Recent Progress: On track  Priority: High  Note:   Current Barriers:  Suboptimal therapeutic regimen for high blood pressure given chronic cough  Pharmacist Clinical Goal(s):  Patient will achieve adherence to monitoring guidelines and medication adherence to  achieve therapeutic efficacy maintain control of blood pressure as evidenced by home blood pressure monitoring  through collaboration with PharmD and provider.   Interventions: 1:1 collaboration with [name], NP regarding development and update of comprehensive plan of care as evidenced by provider attestation and co-signature Inter-disciplinary care team collaboration (see longitudinal plan of care) Comprehensive medication review performed; medication list updated in electronic medical record  Hypertension (BP goal <140/90) -Controlled -Current treatment: Verapamil 180 mg 1 tablet daily - appropriate, effective, safe, accessible Losartan 25 mg 1 tablet daily - appropriate, effective, safe, accessible -Medications previously tried: none -Current home readings: 119/73 HR 71, 128/70 HR 65, 112/63 HR 74 (arm cuff) -Current dietary habits: patient uses little salt in cooking (uses sea salt), rinses vegetables when they are canned and chooses lower sodium options -Current exercise habits: not active currently with the bad weather  -Denies hypotensive/hypertensive symptoms -Educated on Exercise goal of 150 minutes per week; Importance of home blood pressure monitoring; -Counseled to monitor BP at home weekly, document, and provide log at future appointments -Patient admits to some orthostatic hypotension   Hyperlipidemia/PAD: (LDL goal < 170) -controlled -Current treatment: Atorvastatin 20 mg 1 tablet daily - appropriate, effective, safe, accessible Aspirin 81 mg 1 tablet daily - appropriate, effective, safe, accessible -Medications previously tried: none -Current dietary patterns: patient eats lots of vegetables and leaner meats -Current exercise habits: not active currently with the bad weather -Educated on benefits of statin for ASCVD risk reduction Exercise goal of 150 minutes per week; -Recommended to continue current medications.  Chronic cough (Goal: minimize symptoms of  cough) -uncontrolled -Current treatment  Omeprazole 20 mg 1 capsule daily - appropriate, query effective -Medications previously tried: none  -Recommended to continue current medication  Anemia (Goal: Hgb > 11) -Not ideally controlled -Current treatment  Ferrous sulfate 325 mg 1 tablet daily - appropriate, query effective -Medications previously tried: none -Recommended drinking more water and adding more fiber to diet.  ED  -controlled -Current treatment  Sildenafil 20 mg 1 tablet daily as needed - appropriate, effective, safe, accessible -Medications previously tried: none  -Recommended to continue current medication  Health Maintenance -Vaccine gaps: shingles -Current therapy:  Multivitamin 1 tablet daily -Educated on Cost vs benefit of each product must be carefully weighed by individual consumer -Patient is satisfied with current therapy and denies issues -Recommended to continue current medication   Patient Goals/Self-Care Activities Patient will:  - take medications as prescribed check blood pressure weekly, document, and provide at  future appointments target a minimum of 150 minutes of moderate intensity exercise weekly  Follow Up Plan: Telephone follow up appointment with care management team member scheduled for: 1 year      Medication Assistance: None required.  Patient affirms current coverage meets needs.  Compliance/Adherence/Medication fill history: Care Gaps: Shingrix BP - 119/60 (01/16/21)  Star-Rating Drugs: Atorvastatin (Lipitor) 20 mg - Last filled 11/22/20 90 DS at [facility] Losartan (Cozaar) 25 mg - Last filled 12/13/20 90 DS [facility]  Patient's preferred pharmacy is:  [facility] [id number], [place] [address] [address] Phone: [phone] Fax: [phone]  Uses pill box? Yes - 7 day pill box Pt endorses 100% compliance - cannot remember the last time he  missed a dose  We discussed: Benefits of medication synchronization, packaging and delivery as well as enhanced pharmacist oversight with [employer]. Patient decided to: Continue current medication management strategy  Follow Up:  Patient agrees to Care Plan and Follow-up.  Plan: Telephone follow up appointment with care management team member scheduled for:  1 year  [name], PharmD [place] Pharmacist [employer] [facility] [place] [phone]

## 2021-01-24 NOTE — Patient Instructions (Addendum)
Hi Noah King,  It was great to catch up again! Don't forget to look into getting your shingles shot at the pharmacy and try out yoga to see if it helps with your balance.  Please reach out to me if you have any questions or need anything before our follow up!  Best, Maddie  [name], PharmD, [facility]   Visit Information   Goals Addressed   None    Patient Care Plan: [facility] Pharmacy care plan     Problem Identified: Problem: Hypertension, hyperlipidemia, hypothyroidism, chronic cough, and ED      Long-Range Goal: Care plan   Start Date: 02/23/2020  Expected End Date: 02/22/2021  Recent Progress: On track  Priority: High  Note:   Current Barriers:  Suboptimal therapeutic regimen for high blood pressure given chronic cough  Pharmacist Clinical Goal(s):  Patient will achieve adherence to monitoring guidelines and medication adherence to achieve therapeutic efficacy maintain control of blood pressure as evidenced by home blood pressure monitoring  through collaboration with PharmD and provider.   Interventions: 1:1 collaboration with Noah Peng, NP regarding development and update of comprehensive plan of care as evidenced by provider attestation and co-signature Inter-disciplinary care team collaboration (see longitudinal plan of care) Comprehensive medication review performed; medication list updated in electronic medical record  Hypertension (BP goal <140/90) -Controlled -Current treatment: Verapamil 180 mg 1 tablet daily - appropriate, effective, safe, accessible Losartan 25 mg 1 tablet daily - appropriate, effective, safe, accessible -Medications previously tried: none -Current home readings: 119/73 HR 71, 128/70 HR 65, 112/63 HR 74 (arm cuff) -Current dietary habits: patient uses little salt in cooking (uses sea salt), rinses vegetables when they are canned and chooses lower sodium options -Current  exercise habits: not active currently with the bad weather  -Denies hypotensive/hypertensive symptoms -Educated on Exercise goal of 150 minutes per week; Importance of home blood pressure monitoring; -Counseled to monitor BP at home weekly, document, and provide log at future appointments -Patient admits to some orthostatic hypotension   Hyperlipidemia/PAD: (LDL goal < 170) -controlled -Current treatment: Atorvastatin 20 mg 1 tablet daily - appropriate, effective, safe, accessible Aspirin 81 mg 1 tablet daily - appropriate, effective, safe, accessible -Medications previously tried: none -Current dietary patterns: patient eats lots of vegetables and leaner meats -Current exercise habits: not active currently with the bad weather -Educated on benefits of statin for ASCVD risk reduction Exercise goal of 150 minutes per week; -Recommended to continue current medications.  Chronic cough (Goal: minimize symptoms of cough) -uncontrolled -Current treatment  Omeprazole 20 mg 1 capsule daily - appropriate, query effective -Medications previously tried: none  -Recommended to continue current medication  Anemia (Goal: Hgb > 11) -Not ideally controlled -Current treatment  Ferrous sulfate 325 mg 1 tablet daily - appropriate, query effective -Medications previously tried: none -Recommended drinking more water and adding more fiber to diet.  ED  -controlled -Current treatment  Sildenafil 20 mg 1 tablet daily as needed - appropriate, effective, safe, accessible -Medications previously tried: none  -Recommended to continue current medication  Health Maintenance -Vaccine gaps: shingles -Current therapy:  Multivitamin 1 tablet daily -Educated on Cost vs benefit of each product must be carefully weighed by individual consumer -Patient is satisfied with current therapy and denies issues -Recommended to continue current medication   Patient Goals/Self-Care Activities Patient will:  - take  medications as prescribed check blood pressure weekly, document, and provide at future appointments target a minimum of 150 minutes of  moderate intensity exercise weekly  Follow Up Plan: Telephone follow up appointment with care management team member scheduled for: 1 year       Patient verbalizes understanding of instructions and care plan provided today and agrees to view in [place]. Active MyChart status confirmed with patient.   Telephone follow up appointment with pharmacy team member scheduled for: 1 year  [name], [facility]

## 2021-02-06 ENCOUNTER — Ambulatory Visit (INDEPENDENT_AMBULATORY_CARE_PROVIDER_SITE_OTHER): Payer: Medicare HMO | Admitting: Internal Medicine

## 2021-02-06 VITALS — BP 160/70 | HR 70 | Temp 98.2°F | Wt 174.0 lb

## 2021-02-06 DIAGNOSIS — L89210 Pressure ulcer of right hip, unstageable: Secondary | ICD-10-CM | POA: Diagnosis not present

## 2021-02-06 DIAGNOSIS — L89222 Pressure ulcer of left hip, stage 2: Secondary | ICD-10-CM | POA: Diagnosis not present

## 2021-02-06 NOTE — Progress Notes (Signed)
Acute office Visit     This visit occurred during the SARS-CoV-2 public health emergency.  Safety protocols were in place, including screening questions prior to the visit, additional usage of staff PPE, and extensive cleaning of exam room while observing appropriate contact time as indicated for disinfecting solutions.    CC/Reason for Visit: Sore on hip  HPI: Noah King is a 76 y.o. male who is coming in today for the above mentioned reasons.  He states he developed a sore over his right hip around Christmas that started as a blister then drained and now has a black center.  He has the same sore on his left hip although less severe.  He states that this happens frequently.  I will insert pictures below:   Right hip:    Left hip:    He declines any fevers.  No injury that he can recall, no drainage, no significant pain although it does feel uncomfortable at times.  He states he spends a lot of time in a recliner but that it has sufficient padding.  Due to the mottled skin on his back I inquired about heating pads.  He tells me that he sleeps on a heated waterbed and is a side sleeper.  Past Medical/Surgical History: Past Medical History:  Diagnosis Date   Allergy    Cataract    left  extraction- right eye forming   Dupuytren's contracture    GERD (gastroesophageal reflux disease)    Hiatal hernia    past hx-  suspect at one time   Hypertension    Polycythemia     Past Surgical History:  Procedure Laterality Date   cataract extraction left eye     COLONOSCOPY     DUPUYTREN CONTRACTURE RELEASE Left    POLYPECTOMY     rt knee arthroscopic      Social History:  reports that he quit smoking about 36 years ago. His smoking use included cigarettes. He has never used smokeless tobacco. He reports current alcohol use. He reports that he does not use drugs.  Allergies: Allergies  Allergen Reactions   Lotensin [Benazepril Hcl] Cough    Family History:   Family History  Problem Relation Age of Onset   Stroke Mother    Colon cancer Father    Breast cancer Sister    Cancer Sister        breast    Colon polyps Neg Hx    Esophageal cancer Neg Hx      Current Outpatient Medications:    aspirin 81 MG tablet, Take 81 mg by mouth daily., Disp: , Rfl:    atorvastatin (LIPITOR) 20 MG tablet, Take 1 tablet (20 mg total) by mouth daily., Disp: 90 tablet, Rfl: 3   Ferrous Sulfate (IRON) 325 (65 Fe) MG TABS, Take by mouth., Disp: , Rfl:    Multiple Vitamin (MULTIVITAMIN) tablet, Take 1 tablet by mouth daily., Disp: , Rfl:    omeprazole (PRILOSEC) 20 MG capsule, Take 1 capsule (20 mg total) by mouth daily., Disp: 90 capsule, Rfl: 3   sildenafil (REVATIO) 20 MG tablet, Take 1 tablet (20 mg total) by mouth daily as needed., Disp: 30 tablet, Rfl: 3   verapamil (CALAN-SR) 180 MG CR tablet, Take 1 tablet every morning, Disp: 90 tablet, Rfl: 3   losartan (COZAAR) 25 MG tablet, Take 1 tablet (25 mg total) by mouth daily., Disp: 90 tablet, Rfl: 3  Review of Systems:  Constitutional: Denies fever, chills, diaphoresis,  appetite change and fatigue.  HEENT: Denies photophobia, eye pain, redness, hearing loss, ear pain, congestion, sore throat, rhinorrhea, sneezing, mouth sores, trouble swallowing, neck pain, neck stiffness and tinnitus.   Respiratory: Denies SOB, DOE, cough, chest tightness,  and wheezing.   Cardiovascular: Denies chest pain, palpitations and leg swelling.  Gastrointestinal: Denies nausea, vomiting, abdominal pain, diarrhea, constipation, blood in stool and abdominal distention.  Genitourinary: Denies dysuria, urgency, frequency, hematuria, flank pain and difficulty urinating.  Endocrine: Denies: hot or cold intolerance, sweats, changes in hair or nails, polyuria, polydipsia. Musculoskeletal: Denies myalgias, back pain, joint swelling, arthralgias and gait problem.  Skin: Denies pallor. Neurological: Denies dizziness, seizures, syncope,  weakness, light-headedness, numbness and headaches.  Hematological: Denies adenopathy. Easy bruising, personal or family bleeding history  Psychiatric/Behavioral: Denies suicidal ideation, mood changes, confusion, nervousness, sleep disturbance and agitation    Physical Exam: Vitals:   02/06/21 1459 02/06/21 1500  BP: (!) 170/70 (!) 160/70  Pulse: 70   Temp: 98.2 F (36.8 C)   TempSrc: Oral   SpO2: 97%   Weight: 174 lb (78.9 kg)     Body mass index is 24.27 kg/m.   Constitutional: NAD, calm, comfortable Eyes: PERRL, lids and conjunctivae normal ENMT: Mucous membranes are moist.  Skin: See above pictures of pressure sores of bilateral hips. Psychiatric: Normal judgment and insight. Alert and oriented x 3. Normal mood.    Impression and Plan:  Pressure injury of right hip, unstageable ([place]) - Plan: Ambulatory referral to General Surgery  Pressure injury of left hip, stage 2 ([place])  -These appear to be pressure ulcers, I suspect exacerbated by sleeping constantly on a heated bed, areas correspond to his iliac crests, he is a side sleeper. -I am especially concerned about the right hip wound given its necrotic center makes it unstageable. -I will place a referral for general surgery, he will likely need debridement. -They do not appear to be actively infected and as such no antibiotics have been prescribed today.  Time spent: 22 minutes reviewing chart, interviewing and examining patient and formulating plan of care.     Lelon Frohlich, MD [facility]

## 2021-02-12 ENCOUNTER — Telehealth: Payer: Self-pay | Admitting: Adult Health

## 2021-02-12 NOTE — Telephone Encounter (Signed)
Please advise 

## 2021-02-12 NOTE — Telephone Encounter (Signed)
Pt send dr Jerilee Hoh on 02-06-2021 and was referred to [facility] and he called them and per pt was told he need to be referred to wound center for wound on his left side for debridement

## 2021-02-13 ENCOUNTER — Other Ambulatory Visit: Payer: Self-pay | Admitting: Adult Health

## 2021-02-13 DIAGNOSIS — I10 Essential (primary) hypertension: Secondary | ICD-10-CM

## 2021-02-13 DIAGNOSIS — L89210 Pressure ulcer of right hip, unstageable: Secondary | ICD-10-CM

## 2021-02-13 DIAGNOSIS — E785 Hyperlipidemia, unspecified: Secondary | ICD-10-CM

## 2021-02-13 NOTE — Telephone Encounter (Signed)
Patient notified of update  and verbalized understanding. 

## 2021-02-14 ENCOUNTER — Encounter: Payer: Self-pay | Admitting: Adult Health

## 2021-02-15 NOTE — Telephone Encounter (Signed)
Spoke to pt amd he just wanted to know why he wasn't able to see the referral. I advised pt that I wasn't 100 percent sure. I also told pt that the only referral that was placed was the one that he already scheduled for which was wound care. Pt stated that was the only referral needed. Pt was just calling for reassurance. No further action needed!

## 2021-02-21 ENCOUNTER — Other Ambulatory Visit: Payer: Self-pay | Admitting: Adult Health

## 2021-03-01 ENCOUNTER — Other Ambulatory Visit: Payer: Medicare HMO

## 2021-03-02 ENCOUNTER — Other Ambulatory Visit (INDEPENDENT_AMBULATORY_CARE_PROVIDER_SITE_OTHER): Payer: Medicare HMO

## 2021-03-02 DIAGNOSIS — E611 Iron deficiency: Secondary | ICD-10-CM | POA: Diagnosis not present

## 2021-03-02 LAB — IBC + FERRITIN
Ferritin: 51 ng/mL (ref 22.0–322.0)
Iron: 215 ug/dL — ABNORMAL HIGH (ref 42–165)
Saturation Ratios: 57.1 % — ABNORMAL HIGH (ref 20.0–50.0)
TIBC: 376.6 ug/dL (ref 250.0–450.0)
Transferrin: 269 mg/dL (ref 212.0–360.0)

## 2021-03-02 LAB — CBC WITH DIFFERENTIAL/PLATELET
Basophils Absolute: 0 10*3/uL (ref 0.0–0.1)
Basophils Relative: 0.8 % (ref 0.0–3.0)
Eosinophils Absolute: 0.3 10*3/uL (ref 0.0–0.7)
Eosinophils Relative: 5.3 % — ABNORMAL HIGH (ref 0.0–5.0)
HCT: 44.1 % (ref 39.0–52.0)
Hemoglobin: 14.5 g/dL (ref 13.0–17.0)
Lymphocytes Relative: 16.3 % (ref 12.0–46.0)
Lymphs Abs: 1 10*3/uL (ref 0.7–4.0)
MCHC: 32.8 g/dL (ref 30.0–36.0)
MCV: 83.2 fl (ref 78.0–100.0)
Monocytes Absolute: 0.5 10*3/uL (ref 0.1–1.0)
Monocytes Relative: 8.4 % (ref 3.0–12.0)
Neutro Abs: 4.1 10*3/uL (ref 1.4–7.7)
Neutrophils Relative %: 69.2 % (ref 43.0–77.0)
Platelets: 163 10*3/uL (ref 150.0–400.0)
RBC: 5.3 Mil/uL (ref 4.22–5.81)
RDW: 28.6 % — ABNORMAL HIGH (ref 11.5–15.5)
WBC: 5.9 10*3/uL (ref 4.0–10.5)

## 2021-03-06 ENCOUNTER — Other Ambulatory Visit: Payer: Self-pay

## 2021-03-06 ENCOUNTER — Encounter (HOSPITAL_BASED_OUTPATIENT_CLINIC_OR_DEPARTMENT_OTHER): Payer: Medicare HMO | Attending: Internal Medicine | Admitting: Internal Medicine

## 2021-03-06 DIAGNOSIS — Z87891 Personal history of nicotine dependence: Secondary | ICD-10-CM | POA: Diagnosis not present

## 2021-03-06 DIAGNOSIS — I10 Essential (primary) hypertension: Secondary | ICD-10-CM | POA: Insufficient documentation

## 2021-03-06 DIAGNOSIS — E039 Hypothyroidism, unspecified: Secondary | ICD-10-CM | POA: Diagnosis not present

## 2021-03-06 DIAGNOSIS — K219 Gastro-esophageal reflux disease without esophagitis: Secondary | ICD-10-CM | POA: Insufficient documentation

## 2021-03-06 DIAGNOSIS — L89210 Pressure ulcer of right hip, unstageable: Secondary | ICD-10-CM | POA: Diagnosis not present

## 2021-03-07 DIAGNOSIS — S31109A Unspecified open wound of abdominal wall, unspecified quadrant without penetration into peritoneal cavity, initial encounter: Secondary | ICD-10-CM | POA: Diagnosis not present

## 2021-03-07 NOTE — Progress Notes (Signed)
Noah King, Noah King ([id number]) Visit Report for 03/06/2021 Abuse Risk Screen Details Patient Name: Date of Service: Noah King, Noah King 03/06/2021 8:00 A M Medical Record Number: [id number] Patient Account Number: [id number] Date of Birth/Sex: Treating RN: 06/18/1945 (76 y.o. Noah King, Noah King Primary Care Noah King: Noah King Other Clinician: Referring Noah King: Treating Noah King/Extender: Noah King in Treatment: 0 Abuse Risk Screen Items Answer ABUSE RISK SCREEN: Has anyone close to you tried to hurt or harm you recentlyo No Do you feel uncomfortable with anyone in your familyo No Has anyone forced you do things that you didnt want to doo No Electronic Signature(s) Signed: 03/07/2021 5:31:46 PM By: Noah Hammock RN Entered By: Noah King on 03/06/2021 08:17:58 -------------------------------------------------------------------------------- Activities of Daily Living Details Patient Name: Date of Service: Noah King, Noah King 03/06/2021 8:00 A M Medical Record Number: [id number] Patient Account Number: [id number] Date of Birth/Sex: Treating RN: 1945-12-27 (76 y.o. Noah King, Noah King Primary Care Derian Pfost: Noah King Other Clinician: Referring Noah King: Treating Noah King/Extender: Noah King in Treatment: 0 Activities of Daily Living Items Answer Activities of Daily Living (Please select one for each item) Drive Automobile Completely Able T Medications ake Completely Able Use T elephone Completely Able Care for Appearance Completely Able Use T oilet Completely Able Bath / Shower Completely Able Dress Self Completely Able Feed Self Completely Able Walk Completely Able Get In / Out Bed Completely Able Housework Completely Able Prepare Meals Completely [place] Completely Able Shop for Self Completely Able Electronic Signature(s) Signed: 03/07/2021 5:31:46 PM By: Noah Hammock  RN Entered By: Noah King on 03/06/2021 08:18:18 -------------------------------------------------------------------------------- Education Screening Details Patient Name: Date of Service: Noah King 03/06/2021 8:00 A M Medical Record Number: [id number] Patient Account Number: [id number] Date of Birth/Sex: Treating RN: Aug 31, 1945 (76 y.o. Noah King, Noah King Primary Care Noah King: Noah King Other Clinician: Referring Noah King: Treating Noah King/Extender: Noah King in Treatment: 0 Primary Learner Assessed: Patient Learning Preferences/Education Level/Primary Language Learning Preference: Explanation, Demonstration, Communication Board, Printed Material Highest Education Level: High School Preferred Language: English Cognitive Barrier Language Barrier: No Translator Needed: No Memory Deficit: No Emotional Barrier: No Cultural/Religious Beliefs Affecting Medical Care: No Physical Barrier Impaired Vision: Yes Glasses Impaired Hearing: No Decreased Hand dexterity: No Knowledge/Comprehension Knowledge Level: High Comprehension Level: High Ability to understand written instructions: High Ability to understand verbal instructions: High Motivation Anxiety Level: Calm Cooperation: Cooperative Education Importance: Denies Need Interest in Health Problems: Asks Questions Perception: Coherent Willingness to Engage in Self-Management High Activities: Readiness to Engage in Self-Management High Activities: Electronic Signature(s) Signed: 03/07/2021 5:31:46 PM By: Noah Hammock RN Entered By: Noah King on 03/06/2021 08:18:47 -------------------------------------------------------------------------------- Fall Risk Assessment Details Patient Name: Date of Service: Noah King 03/06/2021 8:00 A M Medical Record Number: [id number] Patient Account Number: [id number] Date of Birth/Sex: Treating RN: 13-Jul-1945 (76  y.o. Noah King, Noah King Primary Care [name]: Noah King Other Clinician: Referring [name]: Treating [name]/Extender: Noah King in Treatment: 0 Fall Risk Assessment Items Have you had 2 or more falls in the last 12 monthso 0 No Have you had any fall that resulted in injury in the last 12 monthso 0 No FALLS RISK SCREEN History of falling - immediate or within 3 months 0 No Secondary diagnosis (Do you have 2 or more medical diagnoseso) 0 No Ambulatory aid None/bed rest/wheelchair/nurse 0 No Crutches/cane/walker 0 No Furniture 0 No Intravenous therapy Access/Saline/Heparin Lock 0 No Gait/Transferring Normal/ bed rest/ wheelchair 0 No Weak (short steps with  or without shuffle, stooped but able to lift head while walking, may seek 0 No support from furniture) Impaired (short steps with shuffle, may have difficulty arising from chair, head down, impaired 0 No balance) Mental Status Oriented to own ability 0 No Electronic Signature(s) Signed: 03/07/2021 5:31:46 PM By: Noah Hammock RN Entered By: Noah King on 03/06/2021 08:18:53 -------------------------------------------------------------------------------- Foot Assessment Details Patient Name: Date of Service: Noah King 03/06/2021 8:00 A M Medical Record Number: [id number] Patient Account Number: [id number] Date of Birth/Sex: Treating RN: Mar 07, 1945 (76 y.o. Noah King, Noah King Primary Care [name]: Noah King Other Clinician: Referring [name]: Treating [name]/Extender: Noah King in Treatment: 0 Foot Assessment Items Site Locations + = Sensation present, - = Sensation absent, C = Callus, U = Ulcer R = Redness, W = Warmth, M = Maceration, PU = Pre-ulcerative lesion F = Fissure, S = Swelling, D = Dryness Assessment Right: Left: Other Deformity: No No Prior Foot Ulcer: No No Prior Amputation: No No Charcot Joint: No  No Ambulatory Status: Ambulatory Without Help Gait: Steady Electronic Signature(s) Signed: 03/07/2021 5:31:46 PM By: Noah Hammock RN Entered By: Noah King on 03/06/2021 08:20:41 -------------------------------------------------------------------------------- Nutrition Risk Screening Details Patient Name: Date of Service: Noah King, Noah King 03/06/2021 8:00 A M Medical Record Number: [id number] Patient Account Number: [id number] Date of Birth/Sex: Treating RN: 1945-11-27 (76 y.o. Noah King, Noah King Primary Care Kamylah Manzo: Noah King Other Clinician: Referring Joshaua Epple: Treating Jaeanna Mccomber/Extender: Noah King in Treatment: 0 Height (in): 71 Weight (lbs): 175 Body Mass Index (BMI): 24.4 Nutrition Risk Screening Items Score Screening NUTRITION RISK SCREEN: I have an illness or condition that made me change the kind and/or amount of food I eat 0 No I eat fewer than two meals per day 0 No I eat few fruits and vegetables, or milk products 0 No I have three or more drinks of beer, liquor or wine almost every day 0 No I have tooth or mouth problems that make it hard for me to eat 0 No I don't always have enough money to buy the food I need 0 No I eat alone most of the time 0 No I take three or more different prescribed or over-the-counter drugs a day 0 No Without wanting to, I have lost or gained 10 pounds in the last six months 0 No I am not always physically able to shop, cook and/or feed myself 0 No Nutrition Protocols Good Risk Protocol 0 No interventions needed Moderate Risk Protocol High Risk Proctocol Risk Level: Good Risk Score: 0 Electronic Signature(s) Signed: 03/07/2021 5:31:46 PM By: Noah Hammock RN Entered By: Noah King on 03/06/2021 08:18:59

## 2021-03-07 NOTE — Progress Notes (Signed)
Noah King ([id number]) Visit Report for 03/06/2021 Chief Complaint Document Details Patient Name: Date of Service: Noah King 03/06/2021 8:00 A M Medical Record Number: [id number] Patient Account Number: [id number] Date of Birth/Sex: Treating RN: February 10, 1945 (76 y.o. M) Primary Care Provider: [name]: Referring Provider: Treating Provider/Extender: [name] in Treatment: 0 Information Obtained from: Patient Chief Complaint Right-sided hip wound Electronic Signature(s) Signed: 03/06/2021 9:22:08 AM By: [name] DO Entered By: [name] on 03/06/2021 09:14:34 -------------------------------------------------------------------------------- Debridement Details Patient Name: Date of Service: Noah King 03/06/2021 8:00 A M Medical Record Number: [id number] Patient Account Number: [id number] Date of Birth/Sex: Treating RN: December 18, 1945 (76 y.o. Noah King, Noah King Primary Care Provider: [name]: Referring Provider: Treating Provider/Extender: [name] in Treatment: 0 Debridement Performed for Assessment: Wound #1 Plantar Flank Performed By: Physician [name], DO Debridement Type: Debridement Level of Consciousness (Pre-procedure): Awake and Alert Pre-procedure Verification/Time Out Yes - 08:49 Taken: Start Time: 08:49 Pain Control: Lidocaine T Area Debrided (L x W): otal 2.8 (cm) x 1.5 (cm) = 4.2 (cm) Tissue and other material debrided: Viable, Non-Viable, Eschar, Slough, Subcutaneous, Slough Level: Skin/Subcutaneous Tissue Debridement Description: Excisional Instrument: Blade, Curette, Forceps Bleeding: Minimum Hemostasis Achieved: Pressure End Time: 08:49 Procedural Pain: 0 Post Procedural Pain: 0 Response to Treatment: Procedure was tolerated well Level of Consciousness (Post- Awake and Alert procedure): Post Debridement  Measurements of Total Wound Length: (cm) 2.8 Stage: Unstageable/Unclassified Width: (cm) 1.5 Depth: (cm) 0.2 Volume: (cm) 0.66 Character of Wound/Ulcer Post Debridement: Improved Post Procedure Diagnosis Same as Pre-procedure Electronic Signature(s) Signed: 03/06/2021 9:22:08 AM By: [name] DO Signed: 03/07/2021 5:31:46 PM By: [name] Entered By: Rhae Hammock on 03/06/2021 08:50:04 -------------------------------------------------------------------------------- HPI Details Patient Name: Date of Service: Noah King 03/06/2021 8:00 A M Medical Record Number: [id number] Patient Account Number: [id number] Date of Birth/Sex: Treating RN: 04/28/1945 (76 y.o. M) Primary Care Provider: [name]: Referring Provider: Treating Provider/Extender: [name] in Treatment: 0 History of Present Illness HPI Description: Admission 03/06/2021 Noah King is a 76 year old male with a past medical history of hypothyroidism and essential hypertension who presents to the clinic for a 92-month history of nonhealing ulcer to the right side. He states that he is not sure how this started but guesses that it is from pressure sleeping on his right side along with a heated water bed potentially causing a burn. He had the exact wound on his left side but this is healed. He reports no pain to the area. He denies signs of infection. He has been using antibiotic ointment daily on the wound bed. Electronic Signature(s) Signed: 03/06/2021 9:22:08 AM By: [name] DO Entered By: [name] on 03/06/2021 09:16:17 -------------------------------------------------------------------------------- Physical Exam Details Patient Name: Date of Service: Noah King 03/06/2021 8:00 A M Medical Record Number: [id number] Patient Account Number: [id number] Date of Birth/Sex: Treating RN: December 08, 1945 (76 y.o. M) Primary  Care Provider: [name]: Referring Provider: Treating Provider/Extender: [name] in Treatment: 0 Constitutional respirations regular, non-labored and within target range for patient.Marland Kitchen Psychiatric pleasant and cooperative. Notes Right side: Open wound with Necrotic debris throughout. Post-debridement there is tightly adhered debris in the center with surrounding granulation tissue. No surrounding signs of infection. Electronic Signature(s) Signed: 03/06/2021 9:22:08 AM By: [name] DO Entered By: [name] on 03/06/2021 09:17:05 -------------------------------------------------------------------------------- Physician Orders Details Patient Name: Date of Service: Noah King.  03/06/2021 8:00 A M Medical Record Number: [id number] Patient Account Number: [id number] Date of Birth/Sex: Treating RN: 03/07/1945 (76 y.o. Noah King, Noah King Primary Care Provider: [name]: Referring Provider: Treating Provider/Extender: [name] in Treatment: 0 Verbal / Phone Orders: No Diagnosis Coding Follow-up Appointments ppointment in 1 week. - Dr. Heber [place] w/ Allayne Butcher in Room # 6 Return A Other: - If the Annitta Needs is too expsensive, you can get Medihoney from [facility] and use that instead. Bathing/ Shower/ Hygiene May shower with protection but do not get wound dressing(s) wet. Off-Loading Turn and reposition every 2 hours Other: - Reduce pressure to right hip/flank; try to alternate between left hip and back for sleeping!! Wound Treatment Wound #1 - Flank Wound Laterality: Plantar Cleanser: Wound Cleanser (DME) (Generic) 1 x Per Day/15 Days Discharge Instructions: Cleanse the wound with wound cleanser prior to applying a clean dressing using gauze sponges, not tissue or cotton balls. Peri-Wound Care: Skin Prep (DME) (Generic) 1 x Per Day/15 Days Discharge Instructions: Use  skin prep as directed Prim Dressing: Santyl Ointment 1 x Per Day/15 Days ary Discharge Instructions: Apply nickel thick amount to wound bed as instructed Secondary Dressing: Woven Gauze Sponge, Non-Sterile 4x4 in (DME) (Generic) 1 x Per Day/15 Days Discharge Instructions: Apply over primary dressing as directed. Secondary Dressing: Bordered Gauze, 4x4 in (DME) (Generic) 1 x Per Day/15 Days Discharge Instructions: Apply over primary dressing as directed. Patient Medications llergies: Lotensin A Notifications Medication Indication Start End Santyl DOSE 1 - topical 250 unit/gram ointment - 1 application daily Electronic Signature(s) Signed: 03/06/2021 9:22:08 AM By: [name] DO Previous Signature: 03/06/2021 8:57:22 AM Version By: [name] DO Entered By: [name] on 03/06/2021 09:17:21 -------------------------------------------------------------------------------- Problem List Details Patient Name: Date of Service: Noah King 03/06/2021 8:00 A M Medical Record Number: [id number] Patient Account Number: [id number] Date of Birth/Sex: Treating RN: 1945-02-26 (76 y.o. M) Primary Care Provider: [name]: Referring Provider: Treating Provider/Extender: [name] in Treatment: 0 Active Problems ICD-10 Encounter Code Description Active Date MDM Diagnosis L89.210 Pressure ulcer of right hip, unstageable 03/06/2021 No Yes I10 Essential (primary) hypertension 03/06/2021 No Yes E03.9 Hypothyroidism, unspecified 03/06/2021 No Yes Inactive Problems Resolved Problems Electronic Signature(s) Signed: 03/06/2021 9:22:08 AM By: [name] DO Entered By: [name] on 03/06/2021 09:13:58 -------------------------------------------------------------------------------- Progress Note Details Patient Name: Date of Service: Noah King 03/06/2021 8:00 A M Medical Record Number: [id number] Patient  Account Number: [id number] Date of Birth/Sex: Treating RN: 1945-02-16 (76 y.o. M) Primary Care Provider: [name]: Referring Provider: Treating Provider/Extender: [name] in Treatment: 0 Subjective Chief Complaint Information obtained from Patient Right-sided hip wound History of Present Illness (HPI) Admission 03/06/2021 Mr. Hakeem Frazzini is a 76 year old male with a past medical history of hypothyroidism and essential hypertension who presents to the clinic for a 79-month history of nonhealing ulcer to the right side. He states that he is not sure how this started but guesses that it is from pressure sleeping on his right side along with a heated water bed potentially causing a burn. He had the exact wound on his left side but this is healed. He reports no pain to the area. He denies signs of infection. He has been using antibiotic ointment daily on the wound bed. Patient History Information obtained from Patient. Allergies Lotensin Family History Unknown History. Social History Former smoker, Marital Status - Divorced, Alcohol Use - Rarely, Drug Use -  No History, Caffeine Use - Rarely. Medical History Eyes Patient has history of Cataracts Denies history of Glaucoma, Optic Neuritis Hematologic/Lymphatic Denies history of Anemia, Hemophilia, Human Immunodeficiency Virus, Lymphedema, Sickle Cell Disease Cardiovascular Patient has history of Hypertension Hospitalization/Surgery History - cataract surgery. - colonoscopy. - dupuytren contracture release. - polypetomy. Medical A Surgical History Notes nd Hematologic/Lymphatic polycythemia hx of Gastrointestinal GERD Review of Systems (ROS) Constitutional Symptoms (General Health) Denies complaints or symptoms of Fatigue, Fever, Chills, Marked Weight Change. Eyes Denies complaints or symptoms of Dry Eyes, Vision Changes, Glasses / Contacts. Ear/Nose/Mouth/Throat Denies  complaints or symptoms of Chronic sinus problems or rhinitis. Respiratory Denies complaints or symptoms of Chronic or frequent coughs, Shortness of Breath. Endocrine Denies complaints or symptoms of Heat/cold intolerance. Genitourinary Denies complaints or symptoms of Frequent urination. Integumentary (Skin) Complains or has symptoms of Wounds - 1. Musculoskeletal Denies complaints or symptoms of Muscle Pain, Muscle Weakness. Neurologic Denies complaints or symptoms of Numbness/parasthesias. Psychiatric Denies complaints or symptoms of Claustrophobia, Suicidal. Objective Constitutional respirations regular, non-labored and within target range for patient.. Vitals Time Taken: 8:14 AM, Height: 71 in, Source: Stated, Weight: 175 lbs, Source: Stated, BMI: 24.4, Temperature: 98.4 F, Pulse: 83 bpm, Respiratory Rate: 17 breaths/min, Blood Pressure: 168/88 mmHg. Psychiatric pleasant and cooperative. General Notes: Right side: Open wound with Necrotic debris throughout. Post-debridement there is tightly adhered debris in the center with surrounding granulation tissue. No surrounding signs of infection. Integumentary (Hair, Skin) Wound #1 status is Open. Original cause of wound was Pressure Injury. The date acquired was: 01/07/2021. The wound is located on the Plantar Flank. The wound measures 2.8cm length x 1.5cm width x 0.2cm depth; 3.299cm^2 area and 0.66cm^3 volume. There is Fat Layer (Subcutaneous Tissue) exposed. There is no tunneling or undermining noted. There is a medium amount of serosanguineous drainage noted. The wound margin is distinct with the outline attached to the wound base. There is small (1-33%) red granulation within the wound bed. There is a large (67-100%) amount of necrotic tissue within the wound bed including Eschar and Adherent Slough. Assessment Active Problems ICD-10 Pressure ulcer of right hip, unstageable Essential (primary) hypertension Hypothyroidism,  unspecified Patient presents with a nonhealing wound to the right hip. He thinks this could potentially have been caused by excessive heat From a water bed and exacerbated by continuing to sleep on the right side. I debrided nonviable tissue. No signs of infection on exam. I recommended using Santyl daily. If cost is an issue for Santyl then I recommended obtaining Medihoney and using this daily. He knows to call with any questions or concerns. Follow-up in 1 week 47 minutes was spent on the encounter including face-to-face, EMR review and coordination of care Procedures Wound #1 Pre-procedure diagnosis of Wound #1 is a Pressure Ulcer located on the Plantar Flank . There was a Excisional Skin/Subcutaneous Tissue Debridement with a total area of 4.2 sq cm performed by [name], DO. With the following instrument(s): Blade, Curette, and Forceps to remove Viable and Non-Viable tissue/material. Material removed includes Eschar, Subcutaneous Tissue, and Slough after achieving pain control using Lidocaine. No specimens were taken. A time out was conducted at 08:49, prior to the start of the procedure. A Minimum amount of bleeding was controlled with Pressure. The procedure was tolerated well with a pain level of 0 throughout and a pain level of 0 following the procedure. Post Debridement Measurements: 2.8cm length x 1.5cm width x 0.2cm depth; 0.66cm^3 volume. Post debridement Stage noted as Unstageable/Unclassified. Character of Wound/Ulcer  Post Debridement is improved. Post procedure Diagnosis Wound #1: Same as Pre-Procedure Plan Follow-up Appointments: Return Appointment in 1 week. - Dr. Heber [place] w/ Allayne Butcher in Room # 6 Other: - If the Annitta Needs is too expsensive, you can get Medihoney from [facility] and use that instead. Bathing/ Shower/ Hygiene: May shower with protection but do not get wound dressing(s) wet. Off-Loading: Turn and reposition every 2 hours Other: - Reduce pressure to right  hip/flank; try to alternate between left hip and back for sleeping!! The following medication(s) was prescribed: Santyl topical 250 unit/gram ointment 1 1 application daily WOUND #1: - Flank Wound Laterality: Plantar Cleanser: Wound Cleanser (DME) (Generic) 1 x Per Day/15 Days Discharge Instructions: Cleanse the wound with wound cleanser prior to applying a clean dressing using gauze sponges, not tissue or cotton balls. Peri-Wound Care: Skin Prep (DME) (Generic) 1 x Per Day/15 Days Discharge Instructions: Use skin prep as directed Prim Dressing: Santyl Ointment 1 x Per Day/15 Days ary Discharge Instructions: Apply nickel thick amount to wound bed as instructed Secondary Dressing: Woven Gauze Sponge, Non-Sterile 4x4 in (DME) (Generic) 1 x Per Day/15 Days Discharge Instructions: Apply over primary dressing as directed. Secondary Dressing: Bordered Gauze, 4x4 in (DME) (Generic) 1 x Per Day/15 Days Discharge Instructions: Apply over primary dressing as directed. 1. In office sharp debridement 2. Santyl daily 3. Follow-up in 1 week Electronic Signature(s) Signed: 03/06/2021 9:22:08 AM By: [name] DO Entered By: [name] on 03/06/2021 09:20:59 -------------------------------------------------------------------------------- HxROS Details Patient Name: Date of Service: Noah King 03/06/2021 8:00 A M Medical Record Number: [id number] Patient Account Number: [id number] Date of Birth/Sex: Treating RN: [birth year] (76 y.o. [name] Primary Care Provider: [name]: Referring Provider: Treating Provider/Extender: [name] in Treatment: 0 Information Obtained From Patient Constitutional Symptoms (General Health) Complaints and Symptoms: Negative for: Fatigue; Fever; Chills; Marked Weight Change Eyes Complaints and Symptoms: Negative for: Dry Eyes; Vision Changes; Glasses / Contacts Medical  History: Positive for: Cataracts Negative for: Glaucoma; Optic Neuritis Ear/Nose/Mouth/Throat Complaints and Symptoms: Negative for: Chronic sinus problems or rhinitis Respiratory Complaints and Symptoms: Negative for: Chronic or frequent coughs; Shortness of Breath Endocrine Complaints and Symptoms: Negative for: Heat/cold intolerance Genitourinary Complaints and Symptoms: Negative for: Frequent urination Integumentary (Skin) Complaints and Symptoms: Positive for: Wounds - 1 Musculoskeletal Complaints and Symptoms: Negative for: Muscle Pain; Muscle Weakness Neurologic Complaints and Symptoms: Negative for: Numbness/parasthesias Psychiatric Complaints and Symptoms: Negative for: Claustrophobia; Suicidal Hematologic/Lymphatic Medical History: Negative for: Anemia; Hemophilia; Human Immunodeficiency Virus; Lymphedema; Sickle Cell Disease Past Medical History Notes: polycythemia hx of Cardiovascular Medical History: Positive for: Hypertension Gastrointestinal Medical History: Past Medical History Notes: GERD Immunological Oncologic HBO Extended History Items Eyes: Cataracts Immunizations Pneumococcal Vaccine: Received Pneumococcal Vaccination: Yes Received Pneumococcal Vaccination On or After 60th Birthday: Yes Implantable Devices None Hospitalization / Surgery History Type of Hospitalization/Surgery cataract surgery colonoscopy dupuytren contracture release polypetomy Family and Social History Unknown History: Yes; Former smoker; Marital Status - Divorced; Alcohol Use: Rarely; Drug Use: No History; Caffeine Use: Rarely; Financial Concerns: No; Food, Clothing or Shelter Needs: No; Support System Lacking: No; Transportation Concerns: No Electronic Signature(s) Signed: 03/06/2021 9:22:08 AM By: [name] DO Signed: 03/07/2021 5:31:46 PM By: [name] Entered By: Rhae Hammock on 03/06/2021  08:27:23 -------------------------------------------------------------------------------- SuperBill Details Patient Name: Date of Service: Noah King 03/06/2021 Medical Record Number: [id number] Patient Account Number: [id number] Date of Birth/Sex: Treating RN: 26-Dec-1945 (75 y.o. [name] Primary Care Provider: [name]  Other Clinician: Referring Provider: Treating Provider/Extender: Rae Roam, Reginia Forts in Treatment: 0 Diagnosis Coding ICD-10 Codes Code Description L89.210 Pressure ulcer of right hip, unstageable I10 Essential (primary) hypertension E03.9 Hypothyroidism, unspecified Facility Procedures CPT4 Code: [id number] Description: 92493 - WOUND CARE VISIT-LEV 3 EST PT Modifier: Quantity: 1 CPT4 Code: [id number] Description: 45848 - DEB SUBQ TISSUE 20 SQ CM/< ICD-10 Diagnosis Description L89.210 Pressure ulcer of right hip, unstageable Modifier: Quantity: 1 Physician Procedures : CPT4 Code Description Modifier [id number] 22567 - WC PHYS LEVEL 4 - NEW PT ICD-10 Diagnosis Description L89.210 Pressure ulcer of right hip, unstageable I10 Essential (primary) hypertension E03.9 Hypothyroidism, unspecified Quantity: 1 : [id number] 22179 - WC PHYS SUBQ TISS 20 SQ CM ICD-10 Diagnosis Description L89.210 Pressure ulcer of right hip, unstageable Quantity: 1 Electronic Signature(s) Signed: 03/06/2021 9:22:08 AM By: [name] DO Entered By: [name] on 03/06/2021 09:21:30

## 2021-03-07 NOTE — Progress Notes (Signed)
JAVIN, NONG ([id number]) Visit Report for 03/06/2021 Allergy List Details Patient Name: Date of Service: KEVONTAY, BURKS 03/06/2021 8:00 A M Medical Record Number: [id number] Patient Account Number: [id number] Date of Birth/Sex: Treating RN: 1945/09/28 (76 y.o. [name], [name] Rakeen Gaillard: [name]: Referring Rahma Meller: Treating Stedman [place]/Extender: [name] in Treatment: 0 Allergies Active Allergies Lotensin Allergy Notes Electronic Signature(s) Signed: 03/07/2021 5:31:46 PM By: [name] Entered By: Rhae Hammock on 03/06/2021 08:17:25 -------------------------------------------------------------------------------- Arrival Information Details Patient Name: Date of Service: Glory Buff 03/06/2021 8:00 A M Medical Record Number: [id number] Patient Account Number: [id number] Date of Birth/Sex: Treating RN: Oct 18, 1945 (76 y.o. [name], [name] Ketty Bitton: [name]: Referring Suleman Gunning: Treating Alphonsa Brickle/Extender: [name] in Treatment: 0 Visit Information Patient Arrived: Ambulatory Arrival Time: 08:10 Accompanied By: self Transfer Assistance: Manual Patient Identification Verified: Yes Secondary Verification Process Completed: Yes Patient Requires Transmission-Based Precautions: No Patient Has Alerts: No Electronic Signature(s) Signed: 03/07/2021 5:31:46 PM By: [name] Entered By: Rhae Hammock on 03/06/2021 08:12:27 -------------------------------------------------------------------------------- Clinic Level of Care Assessment Details Patient Name: Date of Service: MUBARAK, BEVENS 03/06/2021 8:00 A M Medical Record Number: [id number] Patient Account Number: [id number] Date of Birth/Sex: Treating RN: Apr 01, 1945 (76 y.o. [name], [name] Kerry-Anne Mezo: [name] Other  Clinician: Referring Deylan Canterbury: Treating Jonaven Hilgers/Extender: [name] in Treatment: 0 Clinic Level of Care Assessment Items TOOL 3 Quantity Score X- 1 0 Use when EandM and Procedure is performed on FOLLOW-UP visit ASSESSMENTS - Nursing Assessment / Reassessment X- 1 10 Reassessment of Co-morbidities (includes updates in patient status) X- 1 5 Reassessment of Adherence to Treatment Plan ASSESSMENTS - Wound and Skin Assessment / Reassessment []  - Points for Wound Assessment can only be taken for a new wound of unknown or different etiology and a procedure is 0 NOT performed to that wound X- 1 5 Simple Wound Assessment / Reassessment - one wound []  - 0 Complex Wound Assessment / Reassessment - multiple wounds []  - 0 Dermatologic / Skin Assessment (not related to wound area) ASSESSMENTS - Focused Assessment []  - 0 Circumferential Edema Measurements - multi extremities []  - 0 Nutritional Assessment / Counseling / Intervention []  - 0 Lower Extremity Assessment (monofilament, tuning fork, pulses) []  - 0 Peripheral Arterial Disease Assessment (using hand held doppler) ASSESSMENTS - Ostomy and/or Continence Assessment and Care []  - 0 Incontinence Assessment and Management []  - 0 Ostomy Care Assessment and Management (repouching, etc.) PROCESS - Coordination of Care []  - Points for Discharge Coordination can only be taken for a new wound of unknown or different etiology and a procedure 0 is NOT performed to that wound X- 1 15 Simple Patient / Family Education for ongoing care []  - 0 Complex (extensive) Patient / Family Education for ongoing care X- 1 10 Staff obtains Programmer, systems, Records, T Results / Process Orders est []  - 0 Staff telephones HHA, Nursing Homes / Clarify orders / etc []  - 0 Routine Transfer to another Facility (non-emergent condition) []  - 0 Routine Hospital Admission (non-emergent condition) X- 1 15 New Admissions / Medical laboratory scientific officer / Ordering NPWT Apligraf, etc. , []  - 0 Emergency Hospital Admission (emergent condition) X- 1 10 Simple Discharge Coordination []  - 0 Complex (extensive) Discharge Coordination PROCESS - Special Needs []  - 0 Pediatric / Minor Patient Management []  - 0 Isolation Patient Management []  - 0 Hearing / Language / Visual special needs []  -  0 Assessment of Community assistance (transportation, D/C planning, etc.) []  - 0 Additional assistance / Altered mentation []  - 0 Support Surface(s) Assessment (bed, cushion, seat, etc.) INTERVENTIONS - Wound Cleansing / Measurement []  - Points for Wound Cleaning / Measurement, Wound Dressing, Specimen Collection and Specimen taken to lab can only 0 be taken for a new wound of unknown or different etiology and a procedure is NOT performed to that wound X- 1 5 Simple Wound Cleansing - one wound []  - 0 Complex Wound Cleansing - multiple wounds X- 1 5 Wound Imaging (photographs - any number of wounds) []  - 0 Wound Tracing (instead of photographs) X- 1 5 Simple Wound Measurement - one wound []  - 0 Complex Wound Measurement - multiple wounds INTERVENTIONS - Wound Dressings X - Small Wound Dressing one or multiple wounds 1 10 []  - 0 Medium Wound Dressing one or multiple wounds []  - 0 Large Wound Dressing one or multiple wounds INTERVENTIONS - Miscellaneous []  - 0 External ear exam []  - 0 Specimen Collection (cultures, biopsies, blood, body fluids, etc.) []  - 0 Specimen(s) / Culture(s) sent or taken to Lab for analysis []  - 0 Patient Transfer (multiple staff / Civil Service fast streamer / Similar devices) []  - 0 Simple Staple / Suture removal (25 or less) []  - 0 Complex Staple / Suture removal (26 or more) []  - 0 Hypo / Hyperglycemic Management (close monitor of Blood Glucose) []  - 0 Ankle / Brachial Index (ABI) - do not check if billed separately X- 1 5 Vital Signs Has the patient been seen at the hospital within the last three  years: Yes Total Score: 100 Level Of Care: New/Established - Level 3 Electronic Signature(s) Signed: 03/07/2021 5:31:46 PM By: [name] Entered By: Rhae Hammock on 03/06/2021 09:01:58 -------------------------------------------------------------------------------- Lower Extremity Assessment Details Patient Name: Date of Service: Glory Buff 03/06/2021 8:00 A M Medical Record Number: [id number] Patient Account Number: [id number] Date of Birth/Sex: Treating RN: Jan 16, 1945 (76 y.o. [name] Primary Care Nachman Sundt: [name]: Referring Daziya Redmond: Treating Ector Laurel/Extender: [name] in Treatment: 0 Electronic Signature(s) Signed: 03/07/2021 5:31:46 PM By: [name] Entered By: Rhae Hammock on 03/06/2021 08:20:49 -------------------------------------------------------------------------------- Multi Wound Chart Details Patient Name: Date of Service: Glory Buff 03/06/2021 8:00 A M Medical Record Number: [id number] Patient Account Number: [id number] Date of Birth/Sex: Treating RN: 27-Aug-1945 (76 y.o. M) Primary Care Zamya Culhane: [name]: Referring Myrah Strawderman: Treating Jeremia Groot/Extender: [name] in Treatment: 0 Vital Signs Height(in): 71 Pulse(bpm): 83 Weight(lbs): 175 Blood Pressure(mmHg): 168/88 Body Mass Index(BMI): 24.4 Temperature(F): 98.4 Respiratory Rate(breaths/min): 17 Photos: [N/A:N/A] Plantar Flank N/A N/A Wound Location: Pressure Injury N/A N/A Wounding Event: Pressure Ulcer N/A N/A Primary Etiology: 01/07/2021 N/A N/A Date Acquired: 0 N/A N/A Weeks of Treatment: Open N/A N/A Wound Status: No N/A N/A Wound Recurrence: 2.8x1.5x0.2 N/A N/A Measurements L x W x D (cm) 3.299 N/A N/A A (cm) : rea 0.66 N/A N/A Volume (cm) : Unstageable/Unclassified N/A N/A Classification: Medium N/A N/A Exudate A  mount: Serosanguineous N/A N/A Exudate Type: red, brown N/A N/A Exudate Color: Distinct, outline attached N/A N/A Wound Margin: Small (1-33%) N/A N/A Granulation A mount: Red N/A N/A Granulation Quality: Large (67-100%) N/A N/A Necrotic A mount: Eschar, Adherent Slough N/A N/A Necrotic Tissue: Fat Layer (Subcutaneous Tissue): Yes N/A N/A Exposed Structures: Fascia: No Tendon: No Muscle: No Joint: No Bone: No Small (1-33%) N/A N/A Epithelialization: Debridement - Excisional N/A N/A Debridement: Pre-procedure  Verification/Time Out 08:49 N/A N/A Taken: Lidocaine N/A N/A Pain Control: Necrotic/Eschar, Subcutaneous, N/A N/A Tissue Debrided: Slough Skin/Subcutaneous Tissue N/A N/A Level: 4.2 N/A N/A Debridement A (sq cm): rea Blade, Curette, Forceps N/A N/A Instrument: Minimum N/A N/A Bleeding: Pressure N/A N/A Hemostasis Achieved: 0 N/A N/A Procedural Pain: 0 N/A N/A Post Procedural Pain: Debridement Treatment Response: Procedure was tolerated well N/A N/A Post Debridement Measurements L x 2.8x1.5x0.2 N/A N/A W x D (cm) 0.66 N/A N/A Post Debridement Volume: (cm) Unstageable/Unclassified N/A N/A Post Debridement Stage: Debridement N/A N/A Procedures Performed: Treatment Notes Electronic Signature(s) Signed: 03/06/2021 9:22:08 AM By: [name] DO Entered By: [name] on 03/06/2021 09:14:04 -------------------------------------------------------------------------------- Multi-Disciplinary Care Plan Details Patient Name: Date of Service: Glory Buff 03/06/2021 8:00 A M Medical Record Number: [id number] Patient Account Number: [id number] Date of Birth/Sex: Treating RN: 1945/10/03 (76 y.o. [name], [name] Christinia Lambeth: [name]: Referring Oliverio Cho: Treating Manu Rubey/Extender: [name] in Treatment: 0 Active Inactive Orientation to the Wound Care Program Nursing  Diagnoses: Knowledge deficit related to the wound healing center program Goals: Patient/caregiver will verbalize understanding of the [place] Program Date Initiated: 03/06/2021 Target Resolution Date: 03/23/2021 Goal Status: Active Interventions: Provide education on orientation to the wound center Notes: Wound/Skin Impairment Nursing Diagnoses: Impaired tissue integrity Knowledge deficit related to ulceration/compromised skin integrity Goals: Patient will have a decrease in wound volume by X% from date: (specify in notes) Date Initiated: 03/06/2021 Target Resolution Date: 03/23/2021 Goal Status: Active Patient/caregiver will verbalize understanding of skin care regimen Date Initiated: 03/06/2021 Target Resolution Date: 03/22/2021 Goal Status: Active Ulcer/skin breakdown will have a volume reduction of 30% by week 4 Date Initiated: 03/06/2021 Target Resolution Date: 03/21/2021 Goal Status: Active Interventions: Assess patient/caregiver ability to obtain necessary supplies Assess patient/caregiver ability to perform ulcer/skin care regimen upon admission and as needed Assess ulceration(s) every visit Notes: Electronic Signature(s) Signed: 03/07/2021 5:31:46 PM By: [name] Entered By: Rhae Hammock on 03/06/2021 08:41:52 -------------------------------------------------------------------------------- Pain Assessment Details Patient Name: Date of Service: Glory Buff 03/06/2021 8:00 A M Medical Record Number: [id number] Patient Account Number: [id number] Date of Birth/Sex: Treating RN: 1945-04-23 (76 y.o. [name] Primary Care Sonja Manseau: [name]: Referring Phenix Vandermeulen: Treating Danea Manter/Extender: [name] in Treatment: 0 Active Problems Location of Pain Severity and Description of Pain Patient Has Paino No Site Locations Rate the pain. Current Pain Level: 0 Worst Pain Level:  10 Least Pain Level: 0 Tolerable Pain Level: 0 Pain Management and Medication Current Pain Management: Electronic Signature(s) Signed: 03/07/2021 5:31:46 PM By: [name] Entered By: Rhae Hammock on 03/06/2021 08:27:43 -------------------------------------------------------------------------------- Patient/Caregiver Education Details Patient Name: Date of Service: Weinfeld, Izsak C. 2/21/2023andnbsp8:00 [place] Record Number: [id number] Patient Account Number: [id number] Date of Birth/Gender: Treating RN: 1945-05-20 (76 y.o. [name] Primary Care Physician: [name]: Referring Physician: Treating Physician/Extender: [name] in Treatment: 0 Education Assessment Education Provided To: Patient Education Topics Provided Basic Hygiene: Methods: Explain/Verbal Responses: Reinforcements needed, State content correctly [place]: o Methods: Explain/Verbal Responses: Reinforcements needed, State content correctly Wound/Skin Impairment: Methods: Explain/Verbal Responses: Reinforcements needed, State content correctly Electronic Signature(s) Signed: 03/07/2021 5:31:46 PM By: [name] Entered By: Rhae Hammock on 03/06/2021 08:46:12 -------------------------------------------------------------------------------- Wound Assessment Details Patient Name: Date of Service: Glory Buff 03/06/2021 8:00 A M Medical Record Number: [id number] Patient Account Number: [id number] Date of Birth/Sex: Treating  RN: [birth year] (76 y.o. Hessie Diener Primary Care Genevieve Ritzel: [name]: Referring Guinn Delarosa: Treating Letetia Romanello/Extender: [name] in Treatment: 0 Wound Status Wound Number: 1 Primary Etiology: Pressure Ulcer Wound Location: Plantar Flank Wound Status: Open Wounding Event: Pressure Injury Date Acquired:  01/07/2021 Weeks Of Treatment: 0 Clustered Wound: No Photos Wound Measurements Length: (cm) 2.8 Width: (cm) 1.5 Depth: (cm) 0.2 Area: (cm) 3.299 Volume: (cm) 0.66 % Reduction in Area: % Reduction in Volume: Epithelialization: Small (1-33%) Tunneling: No Undermining: No Wound Description Classification: Unstageable/Unclassified Wound Margin: Distinct, outline attached Exudate Amount: Medium Exudate Type: Serosanguineous Exudate Color: red, brown Foul Odor After Cleansing: No Slough/Fibrino Yes Wound Bed Granulation Amount: Small (1-33%) Exposed Structure Granulation Quality: Red Fascia Exposed: No Necrotic Amount: Large (67-100%) Fat Layer (Subcutaneous Tissue) Exposed: Yes Necrotic Quality: Eschar, Adherent Slough Tendon Exposed: No Muscle Exposed: No Joint Exposed: No Bone Exposed: No Electronic Signature(s) Signed: 03/06/2021 4:50:54 PM By: Deon Pilling RN, BSN Entered By: Deon Pilling on 03/06/2021 08:26:49 -------------------------------------------------------------------------------- Vitals Details Patient Name: Date of Service: Glory Buff 03/06/2021 8:00 A M Medical Record Number: [id number] Patient Account Number: [id number] Date of Birth/Sex: Treating RN: 1945-05-20 (76 y.o. [name], [name] Athalee Esterline: [name]: Referring Rajveer Handler: Treating Levy Wellman/Extender: [name] in Treatment: 0 Vital Signs Time Taken: 08:14 Temperature (F): 98.4 Height (in): 71 Pulse (bpm): 83 Source: Stated Respiratory Rate (breaths/min): 17 Weight (lbs): 175 Blood Pressure (mmHg): 168/88 Source: Stated Reference Range: 80 - 120 mg / dl Body Mass Index (BMI): 24.4 Electronic Signature(s) Signed: 03/07/2021 5:31:46 PM By: [name] Entered By: Rhae Hammock on 03/06/2021 08:16:25

## 2021-03-13 ENCOUNTER — Other Ambulatory Visit: Payer: Self-pay

## 2021-03-13 ENCOUNTER — Encounter (HOSPITAL_BASED_OUTPATIENT_CLINIC_OR_DEPARTMENT_OTHER): Payer: Medicare HMO | Admitting: Internal Medicine

## 2021-03-13 DIAGNOSIS — K219 Gastro-esophageal reflux disease without esophagitis: Secondary | ICD-10-CM | POA: Diagnosis not present

## 2021-03-13 DIAGNOSIS — I10 Essential (primary) hypertension: Secondary | ICD-10-CM | POA: Diagnosis not present

## 2021-03-13 DIAGNOSIS — L89210 Pressure ulcer of right hip, unstageable: Secondary | ICD-10-CM | POA: Diagnosis not present

## 2021-03-13 DIAGNOSIS — Z87891 Personal history of nicotine dependence: Secondary | ICD-10-CM | POA: Diagnosis not present

## 2021-03-13 DIAGNOSIS — S31109A Unspecified open wound of abdominal wall, unspecified quadrant without penetration into peritoneal cavity, initial encounter: Secondary | ICD-10-CM | POA: Diagnosis not present

## 2021-03-13 DIAGNOSIS — E039 Hypothyroidism, unspecified: Secondary | ICD-10-CM | POA: Diagnosis not present

## 2021-03-13 NOTE — Progress Notes (Signed)
Noah King, Noah King ([id number]) Visit Report for 03/13/2021 Chief Complaint Document Details Patient Name: Date of Service: Noah King, Noah King 03/13/2021 1:45 PM Medical Record Number: [id number] Patient Account Number: [id number] Date of Birth/Sex: Treating RN: 18-Aug-1945 (76 y.o. M) Primary Care Provider: Dorothyann King Other Clinician: Referring Provider: Treating Provider/Extender: Noah King in Treatment: 1 Information Obtained from: Patient Chief Complaint Right-sided hip wound Electronic Signature(s) Signed: 03/13/2021 2:37:11 PM By: Noah Shan DO Entered By: Noah King on 03/13/2021 14:32:36 -------------------------------------------------------------------------------- Debridement Details Patient Name: Date of Service: Noah King 03/13/2021 1:45 PM Medical Record Number: [id number] Patient Account Number: [id number] Date of Birth/Sex: Treating RN: Feb 21, 1945 (75 y.o. Noah King Primary Care Provider: Dorothyann King Other Clinician: Referring Provider: Treating Provider/Extender: Noah King in Treatment: 1 Debridement Performed for Assessment: Wound #1 Right Flank Performed By: Physician Noah Shan, DO Debridement Type: Debridement Level of Consciousness (Pre-procedure): Awake and Alert Pre-procedure Verification/Time Out Yes - 14:20 Taken: Start Time: 14:21 Pain Control: Lidocaine 4% T opical Solution T Area Debrided (L x W): otal 2 (cm) x 1.2 (cm) = 2.4 (cm) Tissue and other material debrided: Viable, Non-Viable, Slough, Subcutaneous, Fibrin/Exudate, Slough Level: Skin/Subcutaneous Tissue Debridement Description: Excisional Instrument: Curette Bleeding: Moderate Hemostasis Achieved: Pressure End Time: 14:28 Procedural Pain: 0 Post Procedural Pain: 0 Response to Treatment: Procedure was tolerated well Level of Consciousness (Post- Awake and Alert procedure): Post  Debridement Measurements of Total Wound Length: (cm) 2 Stage: Unstageable/Unclassified Width: (cm) 1.2 Depth: (cm) 0.2 Volume: (cm) 0.377 Character of Wound/Ulcer Post Debridement: Improved Post Procedure Diagnosis Same as Pre-procedure Electronic Signature(s) Signed: 03/13/2021 2:37:11 PM By: Noah Shan DO Signed: 03/13/2021 3:24:27 PM By: Noah King Entered By: Noah King on 03/13/2021 14:28:42 -------------------------------------------------------------------------------- HPI Details Patient Name: Date of Service: Noah King 03/13/2021 1:45 PM Medical Record Number: [id number] Patient Account Number: [id number] Date of Birth/Sex: Treating RN: 07-Jan-1946 (76 y.o. M) Primary Care Provider: Dorothyann King Other Clinician: Referring Provider: Treating Provider/Extender: Noah King in Treatment: 1 History of Present Illness HPI Description: Admission 03/06/2021 [name] is a 76 year old male with a past medical history of hypothyroidism and essential hypertension who presents to the clinic for a 67-month history of nonhealing ulcer to the right side. He states that he is not sure how this started but guesses that it is from pressure sleeping on his right side along with a heated water bed potentially causing a burn. He had the exact wound on his left side but this is healed. He reports no pain to the area. He denies signs of infection. He has been using antibiotic ointment daily on the wound bed. 2/28; patient presents for follow-up. He was unable to obtain Santyl due to cost but did start Medihoney. He has no issues or complaints today. He denies signs of infection. Electronic Signature(s) Signed: 03/13/2021 2:37:11 PM By: Noah Shan DO Entered By: Noah King on 03/13/2021 14:32:59 -------------------------------------------------------------------------------- Physical Exam Details Patient Name: Date of  Service: Noah King, Noah King 03/13/2021 1:45 PM Medical Record Number: [id number] Patient Account Number: [id number] Date of Birth/Sex: Treating RN: 04-Jan-1946 (76 y.o. M) Primary Care Provider: Dorothyann King Other Clinician: Referring Provider: Treating Provider/Extender: Noah King in Treatment: 1 Constitutional respirations regular, non-labored and within target range for patient.Marland Kitchen Psychiatric pleasant and cooperative. Notes Right side: Open wound with nonviable tissue in the center with surrounding granulation tissue. No signs of surrounding infection. Electronic Signature(s) Signed: 03/13/2021 2:37:11 PM By:  Noah Shan DO Entered By: Noah King on 03/13/2021 14:34:25 -------------------------------------------------------------------------------- Physician Orders Details Patient Name: Date of Service: Noah King, Noah King 03/13/2021 1:45 PM Medical Record Number: [id number] Patient Account Number: [id number] Date of Birth/Sex: Treating RN: 1945-10-18 (76 y.o. Noah King Primary Care Provider: Dorothyann King Other Clinician: Referring Provider: Treating Provider/Extender: Noah King in Treatment: 1 Verbal / Phone Orders: No Diagnosis Coding ICD-10 Coding Code Description L89.210 Pressure ulcer of right hip, unstageable I10 Essential (primary) hypertension E03.9 Hypothyroidism, unspecified Follow-up Appointments ppointment in 1 week. - Dr. Heber King w/ Noah King in Room # 9 Return A Bathing/ Shower/ Hygiene May shower with protection but do not get wound dressing(s) wet. Off-Loading Turn and reposition every 2 hours Other: - Reduce pressure to right hip/flank; try to alternate between left hip and back for sleeping!! Wound Treatment Wound #1 - Flank Wound Laterality: Right Cleanser: Wound Cleanser (Generic) 1 x Per Day/15 Days Discharge Instructions: Cleanse the wound with wound cleanser prior to  applying a clean dressing using gauze sponges, not tissue or cotton balls. Peri-Wound Care: Skin Prep (DME) (Generic) 1 x Per Day/15 Days Discharge Instructions: Use skin prep as directed Prim Dressing: Santyl Ointment 1 x Per Day/15 Days ary Discharge Instructions: Apply ***IN CLINIC ONLY*** Prim Dressing: MediHoney Gel, tube 1.5 (oz) 1 x Per Day/15 Days ary Discharge Instructions: Apply to wound bed DAILY AT HOME. Secondary Dressing: Woven Gauze Sponge, Non-Sterile 4x4 in (Generic) 1 x Per Day/15 Days Discharge Instructions: Apply over primary dressing as directed. Secondary Dressing: Zetuvit Plus Silicone Border Dressing 4x4 (in/in) (DME) (Generic) 1 x Per Day/15 Days Discharge Instructions: Apply silicone border over primary dressing as directed. Electronic Signature(s) Signed: 03/13/2021 3:24:27 PM By: Noah King Signed: 03/13/2021 3:53:03 PM By: Noah Shan DO Previous Signature: 03/13/2021 2:37:11 PM Version By: Noah Shan DO Entered By: Noah King on 03/13/2021 15:15:36 -------------------------------------------------------------------------------- Problem List Details Patient Name: Date of Service: Noah King, Noah King 03/13/2021 1:45 PM Medical Record Number: [id number] Patient Account Number: [id number] Date of Birth/Sex: Treating RN: 02-Oct-1945 (75 y.o. Noah King Primary Care Provider: Dorothyann King Other Clinician: Referring Provider: Treating Provider/Extender: Noah King in Treatment: 1 Active Problems ICD-10 Encounter Code Description Active Date MDM Diagnosis L89.210 Pressure ulcer of right hip, unstageable 03/06/2021 No Yes I10 Essential (primary) hypertension 03/06/2021 No Yes E03.9 Hypothyroidism, unspecified 03/06/2021 No Yes Inactive Problems Resolved Problems Electronic Signature(s) Signed: 03/13/2021 2:37:11 PM By: Noah Shan DO Entered By: Noah King on 03/13/2021  14:32:13 -------------------------------------------------------------------------------- Progress Note Details Patient Name: Date of Service: Noah King 03/13/2021 1:45 PM Medical Record Number: [id number] Patient Account Number: [id number] Date of Birth/Sex: Treating RN: 07/22/1945 (75 y.o. M) Primary Care Provider: Dorothyann King Other Clinician: Referring Provider: Treating Provider/Extender: Noah King in Treatment: 1 Subjective Chief Complaint Information obtained from Patient Right-sided hip wound History of Present Illness (HPI) Admission 03/06/2021 Mr. Jaysiah Marchetta is a 76 year old male with a past medical history of hypothyroidism and essential hypertension who presents to the clinic for a 22-month history of nonhealing ulcer to the right side. He states that he is not sure how this started but guesses that it is from pressure sleeping on his right side along with a heated water bed potentially causing a burn. He had the exact wound on his left side but this is healed. He reports no pain to the area. He denies signs of infection. He has been using antibiotic ointment daily on the wound  bed. 2/28; patient presents for follow-up. He was unable to obtain Santyl due to cost but did start Medihoney. He has no issues or complaints today. He denies signs of infection. Patient History Information obtained from Patient. Family History Unknown History. Social History Former smoker, Marital Status - Divorced, Alcohol Use - Rarely, Drug Use - No History, Caffeine Use - Rarely. Medical History Eyes Patient has history of Cataracts Denies history of Glaucoma, Optic Neuritis Hematologic/Lymphatic Denies history of Anemia, Hemophilia, Human Immunodeficiency Virus, Lymphedema, Sickle Cell Disease Cardiovascular Patient has history of Hypertension Hospitalization/Surgery History - cataract surgery. - colonoscopy. - dupuytren contracture release. -  polypetomy. Medical A Surgical History Notes nd Hematologic/Lymphatic polycythemia hx of Gastrointestinal GERD Objective Constitutional respirations regular, non-labored and within target range for patient.. Vitals Time Taken: 1:52 PM, Height: 71 in, Weight: 175 lbs, BMI: 24.4, Temperature: 98.9 F, Pulse: 91 bpm, Respiratory Rate: 17 breaths/min, Blood Pressure: 151/81 mmHg. Psychiatric pleasant and cooperative. General Notes: Right side: Open wound with nonviable tissue in the center with surrounding granulation tissue. No signs of surrounding infection. Integumentary (Hair, Skin) Wound #1 status is Open. Original cause of wound was Pressure Injury. The date acquired was: 01/07/2021. The wound has been in treatment 1 weeks. The wound is located on the Right Flank. The wound measures 2cm length x 1.2cm width x 0.2cm depth; 1.885cm^2 area and 0.377cm^3 volume. There is Fat Layer (Subcutaneous Tissue) exposed. There is no tunneling or undermining noted. There is a medium amount of serosanguineous drainage noted. The wound margin is distinct with the outline attached to the wound base. There is small (1-33%) red granulation within the wound bed. There is a large (67-100%) amount of necrotic tissue within the wound bed including Eschar and Adherent Slough. Assessment Active Problems ICD-10 Pressure ulcer of right hip, unstageable Essential (primary) hypertension Hypothyroidism, unspecified Patient's wound has shown improvement in size and appearance since last clinic visit. I debrided nonviable tissue. There is tightly adhered debris. I recommended continuing Medihoney. No signs of surrounding infection. Follow-up in 1 week. Procedures Wound #1 Pre-procedure diagnosis of Wound #1 is a Pressure Ulcer located on the Right Flank . There was a Excisional Skin/Subcutaneous Tissue Debridement with a total area of 2.4 sq cm performed by Noah Shan, DO. With the following instrument(s):  Curette to remove Viable and Non-Viable tissue/material. Material removed includes Subcutaneous Tissue, Slough, and Fibrin/Exudate after achieving pain control using Lidocaine 4% T opical Solution. A time out was conducted at 14:20, prior to the start of the procedure. A Moderate amount of bleeding was controlled with Pressure. The procedure was tolerated well with a pain level of 0 throughout and a pain level of 0 following the procedure. Post Debridement Measurements: 2cm length x 1.2cm width x 0.2cm depth; 0.377cm^3 volume. Post debridement Stage noted as Unstageable/Unclassified. Character of Wound/Ulcer Post Debridement is improved. Post procedure Diagnosis Wound #1: Same as Pre-Procedure Plan Follow-up Appointments: Return Appointment in 1 week. - Dr. Heber [place] w/ Noah King in Room # 9 Bathing/ Shower/ Hygiene: May shower with protection but do not get wound dressing(s) wet. Off-Loading: Turn and reposition every 2 hours Other: - Reduce pressure to right hip/flank; try to alternate between left hip and back for sleeping!! WOUND #1: - Flank Wound Laterality: Right Cleanser: Wound Cleanser (Generic) 1 x Per Day/15 Days Discharge Instructions: Cleanse the wound with wound cleanser prior to applying a clean dressing using gauze sponges, not tissue or cotton balls. Peri-Wound Care: Skin Prep (DME) (Generic) 1 x Per Day/15 Days  Discharge Instructions: Use skin prep as directed Prim Dressing: Santyl Ointment 1 x Per Day/15 Days ary Discharge Instructions: Apply ***IN CLINIC ONLY*** Prim Dressing: MediHoney Gel, tube 1.5 (oz) 1 x Per Day/15 Days ary Discharge Instructions: Apply to wound bed DAILY AT HOME. Secondary Dressing: Woven Gauze Sponge, Non-Sterile 4x4 in (Generic) 1 x Per Day/15 Days Discharge Instructions: Apply over primary dressing as directed. Secondary Dressing: Zetuvit Plus Silicone Border Dressing 4x4 (in/in) (DME) (Generic) 1 x Per Day/15 Days Discharge Instructions: Apply  silicone border over primary dressing as directed. 1. In office sharp debridement 2. Continue Medihoney 3. Follow-up in 1 week Electronic Signature(s) Signed: 03/13/2021 2:37:11 PM By: Noah Shan DO Entered By: Noah King on 03/13/2021 14:36:31 -------------------------------------------------------------------------------- HxROS Details Patient Name: Date of Service: Noah King 03/13/2021 1:45 PM Medical Record Number: [id number] Patient Account Number: [id number] Date of Birth/Sex: Treating RN: 1945-06-27 (75 y.o. M) Primary Care Provider: Dorothyann King Other Clinician: Referring Provider: Treating Provider/Extender: Noah King in Treatment: 1 Information Obtained From Patient Eyes Medical History: Positive for: Cataracts Negative for: Glaucoma; Optic Neuritis Hematologic/Lymphatic Medical History: Negative for: Anemia; Hemophilia; Human Immunodeficiency Virus; Lymphedema; Sickle Cell Disease Past Medical History Notes: polycythemia hx of Cardiovascular Medical History: Positive for: Hypertension Gastrointestinal Medical History: Past Medical History Notes: GERD HBO Extended History Items Eyes: Cataracts Immunizations Pneumococcal Vaccine: Received Pneumococcal Vaccination: Yes Received Pneumococcal Vaccination On or After 60th Birthday: Yes Implantable Devices None Hospitalization / Surgery History Type of Hospitalization/Surgery cataract surgery colonoscopy dupuytren contracture release polypetomy Family and Social History Unknown History: Yes; Former smoker; Marital Status - Divorced; Alcohol Use: Rarely; Drug Use: No History; Caffeine Use: Rarely; Financial Concerns: No; Food, Clothing or Shelter Needs: No; Support System Lacking: No; Transportation Concerns: No Electronic Signature(s) Signed: 03/13/2021 2:37:11 PM By: Noah Shan DO Entered By: Noah King on 03/13/2021  14:33:08 -------------------------------------------------------------------------------- SuperBill Details Patient Name: Date of Service: Noah King 03/13/2021 Medical Record Number: [id number] Patient Account Number: [id number] Date of Birth/Sex: Treating RN: [birth year] (76 y.o. Noah King Primary Care Provider: Dorothyann King Other Clinician: Referring Provider: Treating Provider/Extender: Noah King in Treatment: 1 Diagnosis Coding ICD-10 Codes Code Description [phone] Pressure ulcer of right hip, unstageable I10 Essential (primary) hypertension E03.9 Hypothyroidism, unspecified Facility Procedures CPT4 Code: [id number] Description: 73532 - DEB SUBQ TISSUE 20 SQ CM/< ICD-10 Diagnosis Description L89.210 Pressure ulcer of right hip, unstageable Modifier: Quantity: 1 Physician Procedures : CPT4 Code Description Modifier [id number] - WC PHYS SUBQ TISS 20 SQ CM ICD-10 Diagnosis Description L89.210 Pressure ulcer of right hip, unstageable Quantity: 1 Electronic Signature(s) Signed: 03/13/2021 2:37:11 PM By: Noah Shan DO Entered By: Noah King on 03/13/2021 14:36:48

## 2021-03-14 NOTE — Progress Notes (Signed)
SHAWNDELL, SCHILLACI ([id number]) Visit Report for 03/13/2021 Arrival Information Details Patient Name: Date of Service: Noah King, Noah King 03/13/2021 1:45 PM Medical Record Number: [id number] Patient Account Number: [id number] Date of Birth/Sex: Treating RN: 10-27-1945 (76 y.o. M) Primary Care Mckenzie Toruno: [name]: Referring Stellah Donovan: Treating Herbie Lehrmann/Extender: [name] in Treatment: 1 Visit Information History Since Last Visit Added or deleted any medications: No Patient Arrived: Ambulatory Any new allergies or adverse reactions: No Arrival Time: 13:50 Had a fall or experienced change in No Accompanied By: self activities of daily living that may affect Transfer Assistance: None risk of falls: Patient Identification Verified: Yes Signs or symptoms of abuse/neglect since last visito No Secondary Verification Process Completed: Yes Hospitalized since last visit: No Patient Requires Transmission-Based Precautions: No Implantable device outside of the clinic excluding No Patient Has Alerts: No cellular tissue based products placed in the center since last visit: Has Dressing in Place as Prescribed: Yes Pain Present Now: No Electronic Signature(s) Signed: 03/14/2021 7:56:47 AM By: Sandre Kitty Entered By: Sandre Kitty on 03/13/2021 13:51:43 -------------------------------------------------------------------------------- Encounter Discharge Information Details Patient Name: Date of Service: Noah King 03/13/2021 1:45 PM Medical Record Number: [id number] Patient Account Number: [id number] Date of Birth/Sex: Treating RN: 25-Oct-1945 (75 y.o. Noah King Primary Care Lavita Pontius: [name]: Referring Sharnice Bosler: Treating Ingra Rother/Extender: [name] in Treatment: 1 Encounter Discharge Information Items Post Procedure Vitals Discharge Condition: Stable Temperature  (F): 98.9 Ambulatory Status: Ambulatory Pulse (bpm): 91 Discharge Destination: Home Respiratory Rate (breaths/min): 20 Transportation: Private Auto Blood Pressure (mmHg): 151/81 Accompanied By: self Schedule Follow-up Appointment: Yes Clinical Summary of Care: Electronic Signature(s) Signed: 03/13/2021 3:24:27 PM By: Deon Pilling RN, BSN Entered By: Deon Pilling on 03/13/2021 14:33:10 -------------------------------------------------------------------------------- Lower Extremity Assessment Details Patient Name: Date of Service: Noah King, Noah King 03/13/2021 1:45 PM Medical Record Number: [id number] Patient Account Number: [id number] Date of Birth/Sex: Treating RN: 1946/01/04 (75 y.o. Mare Ferrari Primary Care Bellarose Burtt: [name]: Referring Rudransh Bellanca: Treating Davisha Linthicum/Extender: [name] in Treatment: 1 Electronic Signature(s) Signed: 03/13/2021 3:41:54 PM By: Sharyn Creamer RN, BSN Entered By: Sharyn Creamer on 03/13/2021 14:14:17 -------------------------------------------------------------------------------- Multi Wound Chart Details Patient Name: Date of Service: Noah King 03/13/2021 1:45 PM Medical Record Number: [id number] Patient Account Number: [id number] Date of Birth/Sex: Treating RN: 1945-12-10 (75 y.o. M) Primary Care Jakub Debold: [name]: Referring Aza Dantes: Treating Modine Oppenheimer/Extender: [name] in Treatment: 1 Vital Signs Height(in): 71 Pulse(bpm): 91 Weight(lbs): 175 Blood Pressure(mmHg): 151/81 Body Mass Index(BMI): 24.4 Temperature(F): 98.9 Respiratory Rate(breaths/min): 17 Photos: [N/A:N/A] Right Flank N/A N/A Wound Location: Pressure Injury N/A N/A Wounding Event: Pressure Ulcer N/A N/A Primary Etiology: Cataracts, Hypertension N/A N/A Comorbid History: 01/07/2021 N/A N/A Date Acquired: 1 N/A N/A Weeks of Treatment: Open  N/A N/A Wound Status: No N/A N/A Wound Recurrence: 2x1.2x0.2 N/A N/A Measurements L x W x D (cm) 1.885 N/A N/A A (cm) : rea 0.377 N/A N/A Volume (cm) : 42.90% N/A N/A % Reduction in A rea: 42.90% N/A N/A % Reduction in Volume: Unstageable/Unclassified N/A N/A Classification: Medium N/A N/A Exudate A mount: Serosanguineous N/A N/A Exudate Type: red, brown N/A N/A Exudate Color: Distinct, outline attached N/A N/A Wound Margin: Small (1-33%) N/A N/A Granulation A mount: Red N/A N/A Granulation Quality: Large (67-100%) N/A N/A Necrotic A mount: Eschar, Adherent Slough N/A N/A Necrotic Tissue: Fat Layer (Subcutaneous Tissue): Yes N/A N/A Exposed Structures: Fascia: No Tendon: No Muscle:  No Joint: No Bone: No Medium (34-66%) N/A N/A Epithelialization: Debridement - Excisional N/A N/A Debridement: Pre-procedure Verification/Time Out 14:20 N/A N/A Taken: Lidocaine 4% Topical Solution N/A N/A Pain Control: Subcutaneous, Slough N/A N/A Tissue Debrided: Skin/Subcutaneous Tissue N/A N/A Level: 2.4 N/A N/A Debridement A (sq cm): rea Curette N/A N/A Instrument: Moderate N/A N/A Bleeding: Pressure N/A N/A Hemostasis A chieved: 0 N/A N/A Procedural Pain: 0 N/A N/A Post Procedural Pain: Procedure was tolerated well N/A N/A Debridement Treatment Response: 2x1.2x0.2 N/A N/A Post Debridement Measurements L x W x D (cm) 0.377 N/A N/A Post Debridement Volume: (cm) Unstageable/Unclassified N/A N/A Post Debridement Stage: Debridement N/A N/A Procedures Performed: Treatment Notes Electronic Signature(s) Signed: 03/13/2021 2:37:11 PM By: [name] DO Entered By: [name] on 03/13/2021 14:32:20 -------------------------------------------------------------------------------- Multi-Disciplinary Care Plan Details Patient Name: Date of Service: Noah King 03/13/2021 1:45 PM Medical Record Number: [id number] Patient Account Number:  [id number] Date of Birth/Sex: Treating RN: 03/07/1945 (76 y.o. Noah King Primary Care Marabeth Melland: [name]: Referring Allayah Raineri: Treating Inis Borneman/Extender: [name] in Treatment: 1 Active Inactive Wound/Skin Impairment Nursing Diagnoses: Impaired tissue integrity Knowledge deficit related to ulceration/compromised skin integrity Goals: Patient will have a decrease in wound volume by X% from date: (specify in notes) Date Initiated: 03/06/2021 Target Resolution Date: 03/23/2021 Goal Status: Active Patient/caregiver will verbalize understanding of skin care regimen Date Initiated: 03/06/2021 Target Resolution Date: 03/22/2021 Goal Status: Active Ulcer/skin breakdown will have a volume reduction of 30% by week 4 Date Initiated: 03/06/2021 Target Resolution Date: 03/21/2021 Goal Status: Active Interventions: Assess patient/caregiver ability to obtain necessary supplies Assess patient/caregiver ability to perform ulcer/skin care regimen upon admission and as needed Assess ulceration(s) every visit Notes: Electronic Signature(s) Signed: 03/13/2021 3:24:27 PM By: Deon Pilling RN, BSN Entered By: Deon Pilling on 03/13/2021 13:49:39 -------------------------------------------------------------------------------- Pain Assessment Details Patient Name: Date of Service: Noah King 03/13/2021 1:45 PM Medical Record Number: [id number] Patient Account Number: [id number] Date of Birth/Sex: Treating RN: 1945/12/12 (76 y.o. M) Primary Care Leatha Rohner: [name]: Referring Seona Clemenson: Treating Adian Jablonowski/Extender: [name] in Treatment: 1 Active Problems Location of Pain Severity and Description of Pain Patient Has Paino No Site Locations Pain Management and Medication Current Pain Management: Electronic Signature(s) Signed: 03/14/2021 7:56:47 AM By: Sandre Kitty Entered By:  Sandre Kitty on 03/13/2021 13:53:03 -------------------------------------------------------------------------------- Patient/Caregiver Education Details Patient Name: Date of Service: Noah King 2/28/2023andnbsp1:45 PM Medical Record Number: [id number] Patient Account Number: [id number] Date of Birth/Gender: Treating RN: [birth year] (76 y.o. Noah King Primary Care Physician: [name]: Referring Physician: Treating Physician/Extender: [name] in Treatment: 1 Education Assessment Education Provided To: Patient Education Topics Provided Wound/Skin Impairment: Handouts: Skin Care Do's and Dont's Methods: Explain/Verbal Responses: Reinforcements needed Electronic Signature(s) Signed: 03/13/2021 3:24:27 PM By: Deon Pilling RN, BSN Entered By: Deon Pilling on 03/13/2021 13:49:53 -------------------------------------------------------------------------------- Wound Assessment Details Patient Name: Date of Service: Noah King 03/13/2021 1:45 PM Medical Record Number: [id number] Patient Account Number: [id number] Date of Birth/Sex: Treating RN: 07/18/1945 (75 y.o. M) Primary Care Rafiel Mecca: [name]: Referring Rilen Shukla: Treating Ellerie Arenz/Extender: [name] in Treatment: 1 Wound Status Wound Number: 1 Primary Etiology: Pressure Ulcer Wound Location: Right Flank Wound Status: Open Wounding Event: Pressure Injury Comorbid History: Cataracts, Hypertension Date Acquired: 01/07/2021 Weeks Of Treatment: 1 Clustered Wound: No Photos Wound Measurements Length: (cm) 2 Width: (cm) 1.2 Depth: (cm) 0.2 Area: (cm) 1.885 Volume: (cm) 0.377 %  Reduction in Area: 42.9% % Reduction in Volume: 42.9% Epithelialization: Medium (34-66%) Tunneling: No Undermining: No Wound Description Classification: Unstageable/Unclassified Wound Margin: Distinct, outline  attached Exudate Amount: Medium Exudate Type: Serosanguineous Exudate Color: red, brown Foul Odor After Cleansing: No Slough/Fibrino Yes Wound Bed Granulation Amount: Small (1-33%) Exposed Structure Granulation Quality: Red Fascia Exposed: No Necrotic Amount: Large (67-100%) Fat Layer (Subcutaneous Tissue) Exposed: Yes Necrotic Quality: Eschar, Adherent Slough Tendon Exposed: No Muscle Exposed: No Joint Exposed: No Bone Exposed: No Treatment Notes Wound #1 (Flank) Wound Laterality: Right Cleanser Wound Cleanser Discharge Instruction: Cleanse the wound with wound cleanser prior to applying a clean dressing using gauze sponges, not tissue or cotton balls. Peri-Wound Care Skin Prep Discharge Instruction: Use skin prep as directed Topical Primary Dressing Santyl Ointment Discharge Instruction: Apply ***IN CLINIC ONLY*** MediHoney Gel, tube 1.5 (oz) Discharge Instruction: Apply to wound bed DAILY AT HOME. Secondary Dressing Woven Gauze Sponge, Non-Sterile 4x4 in Discharge Instruction: Apply over primary dressing as directed. Zetuvit Plus Silicone Border Dressing 4x4 (in/in) Discharge Instruction: Apply silicone border over primary dressing as directed. Secured With Compression Wrap Compression Stockings Environmental education officer) Signed: 03/13/2021 3:24:27 PM By: Deon Pilling RN, BSN Entered By: Deon Pilling on 03/13/2021 14:24:40 -------------------------------------------------------------------------------- Vitals Details Patient Name: Date of Service: Noah King 03/13/2021 1:45 PM Medical Record Number: [id number] Patient Account Number: [id number] Date of Birth/Sex: Treating RN: 1945-09-19 (75 y.o. M) Primary Care Tyreona Panjwani: [name]: Referring Lyrical Sowle: Treating Ivon Roedel/Extender: [name] in Treatment: 1 Vital Signs Time Taken: 13:52 Temperature (F): 98.9 Height (in): 71 Pulse (bpm):  91 Weight (lbs): 175 Respiratory Rate (breaths/min): 17 Body Mass Index (BMI): 24.4 Blood Pressure (mmHg): 151/81 Reference Range: 80 - 120 mg / dl Electronic Signature(s) Signed: 03/14/2021 7:56:47 AM By: Sandre Kitty Entered By: Sandre Kitty on 03/13/2021 13:52:56

## 2021-03-16 ENCOUNTER — Other Ambulatory Visit: Payer: Self-pay | Admitting: Adult Health

## 2021-03-16 DIAGNOSIS — I10 Essential (primary) hypertension: Secondary | ICD-10-CM

## 2021-03-20 ENCOUNTER — Other Ambulatory Visit: Payer: Self-pay

## 2021-03-20 ENCOUNTER — Encounter (HOSPITAL_BASED_OUTPATIENT_CLINIC_OR_DEPARTMENT_OTHER): Payer: Medicare HMO | Attending: Internal Medicine | Admitting: Internal Medicine

## 2021-03-20 DIAGNOSIS — I10 Essential (primary) hypertension: Secondary | ICD-10-CM | POA: Insufficient documentation

## 2021-03-20 DIAGNOSIS — E785 Hyperlipidemia, unspecified: Secondary | ICD-10-CM | POA: Diagnosis not present

## 2021-03-20 DIAGNOSIS — I739 Peripheral vascular disease, unspecified: Secondary | ICD-10-CM | POA: Diagnosis not present

## 2021-03-20 DIAGNOSIS — Z823 Family history of stroke: Secondary | ICD-10-CM | POA: Diagnosis not present

## 2021-03-20 DIAGNOSIS — N529 Male erectile dysfunction, unspecified: Secondary | ICD-10-CM | POA: Diagnosis not present

## 2021-03-20 DIAGNOSIS — Z008 Encounter for other general examination: Secondary | ICD-10-CM | POA: Diagnosis not present

## 2021-03-20 DIAGNOSIS — Z87891 Personal history of nicotine dependence: Secondary | ICD-10-CM | POA: Diagnosis not present

## 2021-03-20 DIAGNOSIS — G629 Polyneuropathy, unspecified: Secondary | ICD-10-CM | POA: Diagnosis not present

## 2021-03-20 DIAGNOSIS — K219 Gastro-esophageal reflux disease without esophagitis: Secondary | ICD-10-CM | POA: Insufficient documentation

## 2021-03-20 DIAGNOSIS — E039 Hypothyroidism, unspecified: Secondary | ICD-10-CM | POA: Insufficient documentation

## 2021-03-20 DIAGNOSIS — Z809 Family history of malignant neoplasm, unspecified: Secondary | ICD-10-CM | POA: Diagnosis not present

## 2021-03-20 DIAGNOSIS — L89210 Pressure ulcer of right hip, unstageable: Secondary | ICD-10-CM | POA: Diagnosis not present

## 2021-03-20 DIAGNOSIS — Z8249 Family history of ischemic heart disease and other diseases of the circulatory system: Secondary | ICD-10-CM | POA: Diagnosis not present

## 2021-03-20 DIAGNOSIS — Z7982 Long term (current) use of aspirin: Secondary | ICD-10-CM | POA: Diagnosis not present

## 2021-03-20 NOTE — Progress Notes (Signed)
Noah King, Noah King ([id number]) Visit Report for 03/20/2021 Chief Complaint Document Details Patient Name: Date of Service: Noah King, Noah King 03/20/2021 10:45 A M Medical Record Number: [id number] Patient Account Number: [id number] Date of Birth/Sex: Treating RN: 07/06/1945 (76 y.o. M) Primary Care Provider: [name]: Referring Provider: Treating Provider/Extender: [name] in Treatment: 2 Information Obtained from: Patient Chief Complaint Right-sided hip wound Electronic Signature(s) Signed: 03/20/2021 12:34:06 PM By: [name] DO Entered By: [name] on 03/20/2021 12:26:06 -------------------------------------------------------------------------------- Debridement Details Patient Name: Date of Service: Noah King 03/20/2021 10:45 A M Medical Record Number: [id number] Patient Account Number: [id number] Date of Birth/Sex: Treating RN: 1945/12/31 (76 y.o. [name], [name] Provider: [name]: Referring Provider: Treating Provider/Extender: [name] in Treatment: 2 Debridement Performed for Assessment: Wound #1 Right Flank Performed By: Physician [name], DO Debridement Type: Debridement Level of Consciousness (Pre-procedure): Awake and Alert Pre-procedure Verification/Time Out Yes - 11:10 Taken: Start Time: 11:10 Pain Control: Lidocaine T Area Debrided (L x W): otal 1 (cm) x 1.6 (cm) = 1.6 (cm) Tissue and other material debrided: Viable, Non-Viable, Slough, Subcutaneous, Slough Level: Skin/Subcutaneous Tissue Debridement Description: Excisional Instrument: Curette Bleeding: Minimum Hemostasis Achieved: Pressure End Time: 11:10 Procedural Pain: 0 Post Procedural Pain: 0 Response to Treatment: Procedure was tolerated well Level of Consciousness (Post- Awake and Alert procedure): Post Debridement Measurements of Total  Wound Length: (cm) 1 Stage: Category/Stage III Width: (cm) 1.6 Depth: (cm) 0.1 Volume: (cm) 0.126 Character of Wound/Ulcer Post Debridement: Improved Post Procedure Diagnosis Same as Pre-procedure Electronic Signature(s) Signed: 03/20/2021 12:23:29 PM By: [name] Signed: 03/20/2021 12:34:06 PM By: [name] DO Entered By: Rhae Hammock on 03/20/2021 11:11:42 -------------------------------------------------------------------------------- HPI Details Patient Name: Date of Service: Noah King 03/20/2021 10:45 A M Medical Record Number: [id number] Patient Account Number: [id number] Date of Birth/Sex: Treating RN: 1945/03/02 (76 y.o. M) Primary Care Provider: [name]: Referring Provider: Treating Provider/Extender: [name] in Treatment: 2 History of Present Illness HPI Description: Admission 03/06/2021 Noah King is a 76 year old male with a past medical history of hypothyroidism and essential hypertension who presents to the clinic for a 32-monthhistory of nonhealing ulcer to the right side. He states that he is not sure how this started but guesses that it is from pressure sleeping on his right side along with a heated water bed potentially causing a burn. He had the exact wound on his left side but this is healed. He reports no pain to the area. He denies signs of infection. He has been using antibiotic ointment daily on the wound bed. 2/28; patient presents for follow-up. He was unable to obtain Santyl due to cost but did start Medihoney. He has no issues or complaints today. He denies signs of infection. 3/7; patient presents for follow-up. He has been using Medihoney to the wound bed without issues. He denies signs of infection. Electronic Signature(s) Signed: 03/20/2021 12:34:06 PM By: HKalman ShanDO Entered By: HKalman Shanon 03/20/2021  12:26:43 -------------------------------------------------------------------------------- Physical Exam Details Patient Name: Date of Service: Noah King, BALLEN3/07/2021 10:45 A M Medical Record Number: [id number] Account Number: [id number] of Birth/Sex: Treating RN: [birth year](76y.o. M) Primary Care Provider: NDorothyann PengOther Clinician: Referring Provider: Treating Provider/Extender: HStarr Lakein Treatment: 2 Constitutional respirations regular, non-labored and within target range for patient..Marland KitchenPsychiatric pleasant and cooperative. Notes Right side: Open wound with hyper  granulated tissue and tightly adhered nonviable tissue. No signs of surrounding infection. Appears well-healing. Electronic Signature(s) Signed: 03/20/2021 12:34:06 PM By: [name] DO Entered By: [name] on 03/20/2021 12:29:32 -------------------------------------------------------------------------------- Physician Orders Details Patient Name: Date of Service: Noah King 03/20/2021 10:45 A M Medical Record Number: [id number] Patient Account Number: [id number] Date of Birth/Sex: Treating RN: 1945-03-07 (76 y.o. [name] Primary Care Provider: [name]: Referring Provider: Treating Provider/Extender: [name] in Treatment: 2 Verbal / Phone Orders: No Diagnosis Coding Follow-up Appointments ppointment in 2 weeks. - Dr. Heber [place] w/ Allayne Butcher in Room # 9 Return A Bathing/ Shower/ Hygiene May shower with protection but do not get wound dressing(s) wet. Off-Loading Turn and reposition every 2 hours Other: - Reduce pressure to right hip/flank; try to alternate between left hip and back for sleeping!! Wound Treatment Wound #1 - Flank Wound Laterality: Right Cleanser: Wound Cleanser (Generic) 1 x Per Day/15 Days Discharge Instructions: Cleanse the wound with wound cleanser prior to  applying a clean dressing using gauze sponges, not tissue or cotton balls. Peri-Wound Care: Skin Prep (Generic) 1 x Per Day/15 Days Discharge Instructions: Use skin prep as directed Prim Dressing: Santyl Ointment 1 x Per Day/15 Days ary Discharge Instructions: Apply ***IN CLINIC ONLY*** Prim Dressing: MediHoney Gel, tube 1.5 (oz) 1 x Per Day/15 Days ary Discharge Instructions: Apply to wound bed DAILY AT HOME. Secondary Dressing: Woven Gauze Sponge, Non-Sterile 4x4 in (Generic) 1 x Per Day/15 Days Discharge Instructions: Apply over primary dressing as directed. Secondary Dressing: Zetuvit Plus Silicone Border Dressing 4x4 (in/in) (Generic) 1 x Per Day/15 Days Discharge Instructions: Apply silicone border over primary dressing as directed. Electronic Signature(s) Signed: 03/20/2021 12:34:06 PM By: [name] DO Previous Signature: 03/20/2021 12:23:29 PM Version By: [name] Entered By: [name] on 03/20/2021 12:30:00 -------------------------------------------------------------------------------- Problem List Details Patient Name: Date of Service: Noah King 03/20/2021 10:45 A M Medical Record Number: [id number] Patient Account Number: [id number] Date of Birth/Sex: Treating RN: [birth year] (76 y.o. M) Primary Care Provider: [name]: Referring Provider: Treating Provider/Extender: [name] in Treatment: 2 Active Problems ICD-10 Encounter Code Description Active Date MDM Diagnosis L89.210 Pressure ulcer of right hip, unstageable 03/06/2021 No Yes I10 Essential (primary) hypertension 03/06/2021 No Yes E03.9 Hypothyroidism, unspecified 03/06/2021 No Yes Inactive Problems Resolved Problems Electronic Signature(s) Signed: 03/20/2021 12:34:06 PM By: [name] DO Entered By: [name] on 03/20/2021  12:25:50 -------------------------------------------------------------------------------- Progress Note Details Patient Name: Date of Service: Noah King 03/20/2021 10:45 A M Medical Record Number: [id number] Patient Account Number: [id number] Date of Birth/Sex: Treating RN: January 20, 1945 (76 y.o. M) Primary Care Provider: [name]: Referring Provider: Treating Provider/Extender: [name] in Treatment: 2 Subjective Chief Complaint Information obtained from Patient Right-sided hip wound History of Present Illness (HPI) Admission 03/06/2021 Noah King is a 76 year old male with a past medical history of hypothyroidism and essential hypertension who presents to the clinic for a 36-monthhistory of nonhealing ulcer to the right side. He states that he is not sure how this started but guesses that it is from pressure sleeping on his right side along with a heated water bed potentially causing a burn. He had the exact wound on his left side but this is healed. He reports no pain to the area. He denies signs of infection. He has been using antibiotic ointment daily on the wound bed. 2/28; patient presents for follow-up. He  was unable to obtain Santyl due to cost but did start Medihoney. He has no issues or complaints today. He denies signs of infection. 3/7; patient presents for follow-up. He has been using Medihoney to the wound bed without issues. He denies signs of infection. Patient History Information obtained from Patient. Family History Unknown History. Social History Former smoker, Marital Status - Divorced, Alcohol Use - Rarely, Drug Use - No History, Caffeine Use - Rarely. Medical History Eyes Patient has history of Cataracts Denies history of Glaucoma, Optic Neuritis Hematologic/Lymphatic Denies history of Anemia, Hemophilia, Human Immunodeficiency Virus, Lymphedema, Sickle Cell Disease Cardiovascular Patient has  history of Hypertension Hospitalization/Surgery History - cataract surgery. - colonoscopy. - dupuytren contracture release. - polypetomy. Medical A Surgical History Notes nd Hematologic/Lymphatic polycythemia hx of Gastrointestinal GERD Objective Constitutional respirations regular, non-labored and within target range for patient.. Vitals Time Taken: 10:50 AM, Height: 71 in, Weight: 175 lbs, BMI: 24.4, Temperature: 98.5 F, Pulse: 73 bpm, Respiratory Rate: 18 breaths/min, Blood Pressure: 130/73 mmHg. Psychiatric pleasant and cooperative. General Notes: Right side: Open wound with hyper granulated tissue and tightly adhered nonviable tissue. No signs of surrounding infection. Appears well- healing. Integumentary (Hair, Skin) Wound #1 status is Open. Original cause of wound was Pressure Injury. The date acquired was: 01/07/2021. The wound has been in treatment 2 weeks. The wound is located on the Right Flank. The wound measures 1cm length x 1.6cm width x 0.1cm depth; 1.257cm^2 area and 0.126cm^3 volume. There is Fat Layer (Subcutaneous Tissue) exposed. There is no tunneling or undermining noted. There is a medium amount of serosanguineous drainage noted. The wound margin is distinct with the outline attached to the wound base. There is large (67-100%) red, friable, hyper - granulation within the wound bed. There is a small (1- 33%) amount of necrotic tissue within the wound bed including Adherent Slough. Assessment Active Problems ICD-10 Pressure ulcer of right hip, unstageable Essential (primary) hypertension Hypothyroidism, unspecified Patient's wound has shown improvement in size and appearance since last clinic visit. He has done well with Medihoney and I recommended continuing this. I debrided nonviable tissue. No surrounding signs of infection. I used silver nitrate to the hyper granulated areas. Follow-up in 2 weeks. Procedures Wound #1 Pre-procedure diagnosis of Wound #1 is a  Pressure Ulcer located on the Right Flank . There was a Excisional Skin/Subcutaneous Tissue Debridement with a total area of 1.6 sq cm performed by [name], DO. With the following instrument(s): Curette to remove Viable and Non-Viable tissue/material. Material removed includes Subcutaneous Tissue and Slough and after achieving pain control using Lidocaine. No specimens were taken. A time out was conducted at 11:10, prior to the start of the procedure. A Minimum amount of bleeding was controlled with Pressure. The procedure was tolerated well with a pain level of 0 throughout and a pain level of 0 following the procedure. Post Debridement Measurements: 1cm length x 1.6cm width x 0.1cm depth; 0.126cm^3 volume. Post debridement Stage noted as Category/Stage III. Character of Wound/Ulcer Post Debridement is improved. Post procedure Diagnosis Wound #1: Same as Pre-Procedure Plan Follow-up Appointments: Return Appointment in 2 weeks. - Dr. Heber [place] w/ Allayne Butcher in Room # 9 Bathing/ Shower/ Hygiene: May shower with protection but do not get wound dressing(s) wet. Off-Loading: Turn and reposition every 2 hours Other: - Reduce pressure to right hip/flank; try to alternate between left hip and back for sleeping!! WOUND #1: - Flank Wound Laterality: Right Cleanser: Wound Cleanser (Generic) 1 x Per Day/15 Days Discharge Instructions: Cleanse  the wound with wound cleanser prior to applying a clean dressing using gauze sponges, not tissue or cotton balls. Peri-Wound Care: Skin Prep (Generic) 1 x Per Day/15 Days Discharge Instructions: Use skin prep as directed Prim Dressing: Santyl Ointment 1 x Per Day/15 Days ary Discharge Instructions: Apply ***IN CLINIC ONLY*** Prim Dressing: MediHoney Gel, tube 1.5 (oz) 1 x Per Day/15 Days ary Discharge Instructions: Apply to wound bed DAILY AT HOME. Secondary Dressing: Woven Gauze Sponge, Non-Sterile 4x4 in (Generic) 1 x Per Day/15 Days Discharge  Instructions: Apply over primary dressing as directed. Secondary Dressing: Zetuvit Plus Silicone Border Dressing 4x4 (in/in) (Generic) 1 x Per Day/15 Days Discharge Instructions: Apply silicone border over primary dressing as directed. 1. Medihoney 2. Silver nitrate 3. Follow-up in 2 weeks 4. In office sharp debridement Electronic Signature(s) Signed: 03/20/2021 12:34:06 PM By: [name] DO Entered By: [name] on 03/20/2021 12:32:34 -------------------------------------------------------------------------------- HxROS Details Patient Name: Date of Service: Noah King 03/20/2021 10:45 A M Medical Record Number: [id number] Patient Account Number: [id number] Date of Birth/Sex: Treating RN: 1945-11-18 (76 y.o. M) Primary Care Provider: [name]: Referring Provider: Treating Provider/Extender: [name] in Treatment: 2 Information Obtained From Patient Eyes Medical History: Positive for: Cataracts Negative for: Glaucoma; Optic Neuritis Hematologic/Lymphatic Medical History: Negative for: Anemia; Hemophilia; Human Immunodeficiency Virus; Lymphedema; Sickle Cell Disease Past Medical History Notes: polycythemia hx of Cardiovascular Medical History: Positive for: Hypertension Gastrointestinal Medical History: Past Medical History Notes: GERD HBO Extended History Items Eyes: Cataracts Immunizations Pneumococcal Vaccine: Received Pneumococcal Vaccination: Yes Received Pneumococcal Vaccination On or After 60th Birthday: Yes Implantable Devices None Hospitalization / Surgery History Type of Hospitalization/Surgery cataract surgery colonoscopy dupuytren contracture release polypetomy Family and Social History Unknown History: Yes; Former smoker; Marital Status - Divorced; Alcohol Use: Rarely; Drug Use: No History; Caffeine Use: Rarely; Financial Concerns: No; Food, Clothing or Shelter Needs: No;  Support System Lacking: No; Transportation Concerns: No Electronic Signature(s) Signed: 03/20/2021 12:34:06 PM By: [name] DO Entered By: [name] on 03/20/2021 12:28:59 -------------------------------------------------------------------------------- [place] Details Patient Name: Date of Service: Noah King 03/20/2021 Medical Record Number: [id number] Patient Account Number: [id number] Date of Birth/Sex: Treating RN: [birth year] (76 y.o. M) Primary Care Provider: [name]: Referring Provider: Treating Provider/Extender: [name] in Treatment: 2 Diagnosis Coding ICD-10 Codes Code Description [phone] Pressure ulcer of right hip, unstageable I10 Essential (primary) hypertension E03.9 Hypothyroidism, unspecified Facility Procedures CPT4 Code: [id number] Description: 23557 - DEB SUBQ TISSUE 20 SQ CM/< ICD-10 Diagnosis Description L89.210 Pressure ulcer of right hip, unstageable Modifier: Quantity: 1 Physician Procedures : CPT4 Code Description Modifier [id number] - WC PHYS SUBQ TISS 20 SQ CM ICD-10 Diagnosis Description L89.210 Pressure ulcer of right hip, unstageable Quantity: 1 Electronic Signature(s) Signed: 03/20/2021 12:34:06 PM By: [name] DO Entered By: [name] on 03/20/2021 12:33:04

## 2021-03-20 NOTE — Progress Notes (Signed)
Noah King, Noah King ([id number]) Visit Report for 03/20/2021 Arrival Information Details Patient Name: Date of Service: Noah King, Noah King 03/20/2021 10:45 A M Medical Record Number: [id number] Patient Account Number: [id number] Date of Birth/Sex: Treating RN: 18-Feb-1945 (76 y.o. Lorette Ang, Meta.Reding Primary Care Lineth Thielke: [name]: Referring Charnette Younkin: Treating Naasir Carreira/Extender: [name] in Treatment: 2 Visit Information History Since Last Visit Added or deleted any medications: No Patient Arrived: Ambulatory Any new allergies or adverse reactions: No Arrival Time: 10:45 Had a fall or experienced change in No Accompanied By: self activities of daily living that may affect Transfer Assistance: None risk of falls: Patient Identification Verified: Yes Signs or symptoms of abuse/neglect since last visito No Secondary Verification Process Completed: Yes Hospitalized since last visit: No Patient Requires Transmission-Based Precautions: No Implantable device outside of the clinic excluding No Patient Has Alerts: No cellular tissue based products placed in the center since last visit: Has Dressing in Place as Prescribed: Yes Pain Present Now: No Electronic Signature(s) Signed: 03/20/2021 5:16:13 PM By: Deon Pilling RN, BSN Entered By: Deon Pilling on 03/20/2021 10:54:07 -------------------------------------------------------------------------------- Lower Extremity Assessment Details Patient Name: Date of Service: Noah King 03/20/2021 10:45 A M Medical Record Number: [id number] Patient Account Number: [id number] Date of Birth/Sex: Treating RN: 10/10/1945 (75 y.o. Hessie Diener Primary Care Patrich Heinze: [name]: Referring Jamelle Goldston: Treating Keyly Baldonado/Extender: [name] in Treatment: 2 Electronic Signature(s) Signed: 03/20/2021 5:16:13 PM By: Deon Pilling RN, BSN Entered By:  Deon Pilling on 03/20/2021 10:54:41 -------------------------------------------------------------------------------- Multi Wound Chart Details Patient Name: Date of Service: Noah King 03/20/2021 10:45 A M Medical Record Number: [id number] Patient Account Number: [id number] Date of Birth/Sex: Treating RN: [birth year] (76 y.o. M) Primary Care Tavi Hoogendoorn: [name]: Referring Aundreya Souffrant: Treating Farhad Burleson/Extender: [name] in Treatment: 2 Vital Signs Height(in): 71 Pulse(bpm): 73 Weight(lbs): 175 Blood Pressure(mmHg): 130/73 Body Mass Index(BMI): 24.4 Temperature(F): 98.5 Respiratory Rate(breaths/min): 18 Photos: [N/A:N/A] Right Flank N/A N/A Wound Location: Pressure Injury N/A N/A Wounding Event: Pressure Ulcer N/A N/A Primary Etiology: Cataracts, Hypertension N/A N/A Comorbid History: 01/07/2021 N/A N/A Date Acquired: 2 N/A N/A Weeks of Treatment: Open N/A N/A Wound Status: No N/A N/A Wound Recurrence: 1x1.6x0.1 N/A N/A Measurements L x W x D (cm) 1.257 N/A N/A A (cm) : rea 0.126 N/A N/A Volume (cm) : 61.90% N/A N/A % Reduction in A rea: 80.90% N/A N/A % Reduction in Volume: Category/Stage III N/A N/A Classification: Medium N/A N/A Exudate A mount: Serosanguineous N/A N/A Exudate Type: red, brown N/A N/A Exudate Color: Distinct, outline attached N/A N/A Wound Margin: Large (67-100%) N/A N/A Granulation A mount: Red, Hyper-granulation, Friable N/A N/A Granulation Quality: Small (1-33%) N/A N/A Necrotic A mount: Fat Layer (Subcutaneous Tissue): Yes N/A N/A Exposed Structures: Fascia: No Tendon: No Muscle: No Joint: No Bone: No Large (67-100%) N/A N/A Epithelialization: Debridement - Excisional N/A N/A Debridement: Pre-procedure Verification/Time Out 11:10 N/A N/A Taken: Lidocaine N/A N/A Pain Control: Subcutaneous, Slough N/A N/A Tissue Debrided: Skin/Subcutaneous Tissue N/A  N/A Level: 1.6 N/A N/A Debridement A (sq cm): rea Curette N/A N/A Instrument: Minimum N/A N/A Bleeding: Pressure N/A N/A Hemostasis A chieved: 0 N/A N/A Procedural Pain: 0 N/A N/A Post Procedural Pain: Procedure was tolerated well N/A N/A Debridement Treatment Response: 1x1.6x0.1 N/A N/A Post Debridement Measurements L x W x D (cm) 0.126 N/A N/A Post Debridement Volume: (cm) Category/Stage III N/A N/A Post Debridement Stage: Debridement N/A N/A Procedures Performed: Treatment  Notes Electronic Signature(s) Signed: 03/20/2021 12:34:06 PM By: [name] DO Entered By: [name] on 03/20/2021 12:25:59 -------------------------------------------------------------------------------- Multi-Disciplinary Care Plan Details Patient Name: Date of Service: Noah King, Noah King 03/20/2021 10:45 A M Medical Record Number: [id number] Patient Account Number: [id number] Date of Birth/Sex: Treating RN: November 06, 1945 (76 y.o. [name], [name] Tempie Gibeault: [name]: Referring Evaan Tidwell: Treating Reuben Knoblock/Extender: [name] in Treatment: 2 Active Inactive Wound/Skin Impairment Nursing Diagnoses: Impaired tissue integrity Knowledge deficit related to ulceration/compromised skin integrity Goals: Patient will have a decrease in wound volume by X% from date: (specify in notes) Date Initiated: 03/06/2021 Target Resolution Date: 03/23/2021 Goal Status: Active Patient/caregiver will verbalize understanding of skin care regimen Date Initiated: 03/06/2021 Target Resolution Date: 03/22/2021 Goal Status: Active Ulcer/skin breakdown will have a volume reduction of 30% by week 4 Date Initiated: 03/06/2021 Target Resolution Date: 03/21/2021 Goal Status: Active Interventions: Assess patient/caregiver ability to obtain necessary supplies Assess patient/caregiver ability to perform ulcer/skin care regimen upon admission and as  needed Assess ulceration(s) every visit Notes: Electronic Signature(s) Signed: 03/20/2021 12:23:29 PM By: [name] Entered By: Rhae Hammock on 03/20/2021 11:12:39 -------------------------------------------------------------------------------- Pain Assessment Details Patient Name: Date of Service: Noah King 03/20/2021 10:45 A M Medical Record Number: [id number] Patient Account Number: [id number] Date of Birth/Sex: Treating RN: 20-Mar-1945 (76 y.o. Hessie Diener Primary Care Emmalea Treanor: [name]: Referring Enriqueta Augusta: Treating Sundee Garland/Extender: [name] in Treatment: 2 Active Problems Location of Pain Severity and Description of Pain Patient Has Paino No Site Locations Rate the pain. Rate the pain. Current Pain Level: 0 Pain Management and Medication Current Pain Management: Is the Current Pain Management Adequate: Adequate How does your wound impact your activities of daily livingo Sleep: No Bathing: No Appetite: No Relationship With Others: No Bladder Continence: No Emotions: No Bowel Continence: No Work: No Toileting: No Drive: No Dressing: No Hobbies: No Engineer, maintenance) Signed: 03/20/2021 5:16:13 PM By: Deon Pilling RN, BSN Entered By: Deon Pilling on 03/20/2021 10:54:34 -------------------------------------------------------------------------------- Patient/Caregiver Education Details Patient Name: Date of Service: Noah King 3/7/2023andnbsp10:45 [place] Record Number: [id number] Patient Account Number: [id number] Date of Birth/Gender: Treating RN: 1945/06/09 (76 y.o. [name] Primary Care Physician: [name]: Referring Physician: Treating Physician/Extender: [name] in Treatment: 2 Education Assessment Education Provided To: Patient Education Topics Provided Wound/Skin Impairment: Methods:  Explain/Verbal Responses: Reinforcements needed, State content correctly Electronic Signature(s) Signed: 03/20/2021 12:23:29 PM By: [name] Entered By: Rhae Hammock on 03/20/2021 11:12:58 -------------------------------------------------------------------------------- Wound Assessment Details Patient Name: Date of Service: Noah King 03/20/2021 10:45 A M Medical Record Number: [id number] Patient Account Number: [id number] Date of Birth/Sex: Treating RN: October 25, 1945 (75 y.o. Hessie Diener Primary Care Anahid Eskelson: [name]: Referring Johnell Bas: Treating Daking Westervelt/Extender: [name] in Treatment: 2 Wound Status Wound Number: 1 Primary Etiology: Pressure Ulcer Wound Location: Right Flank Wound Status: Open Wounding Event: Pressure Injury Comorbid History: Cataracts, Hypertension Date Acquired: 01/07/2021 Weeks Of Treatment: 2 Clustered Wound: No Photos Wound Measurements Length: (cm) 1 Width: (cm) 1.6 Depth: (cm) 0.1 Area: (cm) 1.257 Volume: (cm) 0.126 % Reduction in Area: 61.9% % Reduction in Volume: 80.9% Epithelialization: Large (67-100%) Tunneling: No Undermining: No Wound Description Classification: Category/Stage III Wound Margin: Distinct, outline attached Exudate Amount: Medium Exudate Type: Serosanguineous Exudate Color: red, brown Foul Odor After Cleansing: No Slough/Fibrino Yes Wound Bed Granulation Amount: Large (67-100%) Exposed Structure Granulation Quality: Red,  Hyper-granulation, Friable Fascia Exposed: No Necrotic Amount: Small (1-33%) Fat Layer (Subcutaneous Tissue) Exposed: Yes Necrotic Quality: Adherent Slough Tendon Exposed: No Muscle Exposed: No Joint Exposed: No Bone Exposed: No Electronic Signature(s) Signed: 03/20/2021 5:16:13 PM By: Deon Pilling RN, BSN Entered By: Deon Pilling on 03/20/2021  10:57:32 -------------------------------------------------------------------------------- [place] Details Patient Name: Date of Service: Noah King 03/20/2021 10:45 A M Medical Record Number: [id number] Patient Account Number: [id number] Date of Birth/Sex: Treating RN: [birth year] (76 y.o. Hessie Diener Primary Care Tnya Ades: [name]: Referring Greidy Sherard: Treating Haani Bakula/Extender: [name] in Treatment: 2 Vital Signs Time Taken: 10:50 Temperature (F): 98.5 Height (in): 71 Pulse (bpm): 73 Weight (lbs): 175 Respiratory Rate (breaths/min): 18 Body Mass Index (BMI): 24.4 Blood Pressure (mmHg): 130/73 Reference Range: 80 - 120 mg / dl Electronic Signature(s) Signed: 03/20/2021 5:16:13 PM By: Deon Pilling RN, BSN Entered By: Deon Pilling on 03/20/2021 10:54:25

## 2021-04-03 ENCOUNTER — Other Ambulatory Visit: Payer: Self-pay

## 2021-04-03 ENCOUNTER — Encounter (HOSPITAL_BASED_OUTPATIENT_CLINIC_OR_DEPARTMENT_OTHER): Payer: Medicare HMO | Admitting: Internal Medicine

## 2021-04-03 DIAGNOSIS — E039 Hypothyroidism, unspecified: Secondary | ICD-10-CM | POA: Diagnosis not present

## 2021-04-03 DIAGNOSIS — Z87891 Personal history of nicotine dependence: Secondary | ICD-10-CM | POA: Diagnosis not present

## 2021-04-03 DIAGNOSIS — K219 Gastro-esophageal reflux disease without esophagitis: Secondary | ICD-10-CM | POA: Diagnosis not present

## 2021-04-03 DIAGNOSIS — I10 Essential (primary) hypertension: Secondary | ICD-10-CM | POA: Diagnosis not present

## 2021-04-03 DIAGNOSIS — L89210 Pressure ulcer of right hip, unstageable: Secondary | ICD-10-CM

## 2021-04-03 NOTE — Progress Notes (Signed)
AHMED, INNISS ([id number]) ?Visit Report for 04/03/2021 ?Arrival Information Details ?Patient Name: Date of Service: ?Noah King, Noah C. 04/03/2021 11:00 A M ?Medical Record Number: [id number] ?Patient Account Number: [id number] ?Date of Birth/Sex: Treating RN: ?1945/05/29 (76 y.o. Marcheta Grammes ?Primary Care Zayin Valadez: [name]: ?Referring Avelino Herren: ?Treating Dianna Deshler/Extender: [name] ?Nafziger, [name] ?Weeks in Treatment: 4 ?Visit Information History Since Last Visit ?Added or deleted any medications: No ?Patient Arrived: Ambulatory ?Any new allergies or adverse reactions: No ?Arrival Time: 11:17 ?Had a fall or experienced change in No ?Accompanied By: self ?activities of daily living that may affect ?Transfer Assistance: None ?risk of falls: ?Patient Identification Verified: Yes ?Signs or symptoms of abuse/neglect since last visito No ?Secondary Verification Process Completed: Yes ?Hospitalized since last visit: No ?Patient Requires Transmission-Based Precautions: No ?Implantable device outside of the clinic excluding No ?Patient Has Alerts: No ?cellular tissue based products placed in the center ?since last visit: ?Has Dressing in Place as Prescribed: Yes ?Pain Present Now: No ?Electronic Signature(s) ?Signed: 04/03/2021 4:35:21 PM By: Lorrin Jackson ?Entered By: Lorrin Jackson on 04/03/2021 11:18:06 ?-------------------------------------------------------------------------------- ?Encounter Discharge Information Details ?Patient Name: Date of Service: ?Noah King, Noah C. 04/03/2021 11:00 A M ?Medical Record Number: [id number] ?Patient Account Number: [id number] ?Date of Birth/Sex: Treating RN: ?10/09/1945 (77 y.o. [name], [name] ?Primary Care Daeton Kluth: [name]: ?Referring Klea Nall: ?Treating Lurae Hornbrook/Extender: [name] ?Nafziger, [name] ?Weeks in Treatment: 4 ?Encounter Discharge Information Items ?Discharge Condition: Stable ?Ambulatory  Status: Ambulatory ?Discharge Destination: Home ?Transportation: Private Auto ?Accompanied By: self ?Schedule Follow-up Appointment: Yes ?Clinical Summary of Care: Patient Declined ?Electronic Signature(s) ?Signed: 04/03/2021 5:22:10 PM By: [name] ?Entered By: Rhae Hammock on 04/03/2021 11:50:27 ?-------------------------------------------------------------------------------- ?Lower Extremity Assessment Details ?Patient Name: ?Date of Service: ?Noah King, Noah C. 04/03/2021 11:00 A M ?Medical Record Number: [id number] ?Patient Account Number: [id number] ?Date of Birth/Sex: ?Treating RN: ?[birth year] (76 y.o. Marcheta Grammes ?Primary Care Shahla Betsill: [name] ?Other Clinician: ?Referring Ralphael Southgate: ?Treating Audryna Wendt/Extender: [name] ?Nafziger, [name] ?Weeks in Treatment: 4 ?Electronic Signature(s) ?Signed: 04/03/2021 4:35:21 PM By: Lorrin Jackson ?Entered By: Lorrin Jackson on 04/03/2021 11:19:57 ?-------------------------------------------------------------------------------- ?Multi Wound Chart Details ?Patient Name: ?Date of Service: ?Noah King, Noah C. 04/03/2021 11:00 A M ?Medical Record Number: [id number] ?Patient Account Number: [id number] ?Date of Birth/Sex: ?Treating RN: ?[birth year] (76 y.o. M) ?Primary Care Lena Fieldhouse: [name] ?Other Clinician: ?Referring Clemma Johnsen: ?Treating Briseidy Spark/Extender: [name] ?Nafziger, [name] ?Weeks in Treatment: 4 ?Vital Signs ?Height(in): 71 ?Pulse(bpm): 74 ?Weight(lbs): 175 ?Blood Pressure(mmHg): 150/70 ?Body Mass Index(BMI): 24.4 ?Temperature(??F): 98.7 ?Respiratory Rate(breaths/min): 17 ?Photos: [N/A:N/A] ?Right Flank N/A N/A ?Wound Location: ?Pressure Injury N/A N/A ?Wounding Event: ?Pressure Ulcer N/A N/A ?Primary Etiology: ?Cataracts, Hypertension N/A N/A ?Comorbid History: ?01/07/2021 N/A N/A ?Date Acquired: ?4 N/A N/A ?Weeks of Treatment: ?Open N/A N/A ?Wound Status: ?No N/A N/A ?Wound Recurrence: ?0.7x1x0.1 N/A N/A ?Measurements L x  W x D (cm) ?0.55 N/A N/A ?A (cm?) : ?rea ?0.055 N/A N/A ?Volume (cm?) : ?83.30% N/A N/A ?% Reduction in A rea: ?91.70% N/A N/A ?% Reduction in Volume: ?Category/Stage III N/A N/A ?Classification: ?Medium N/A N/A ?Exudate A mount: ?Serosanguineous N/A N/A ?Exudate Type: ?red, brown N/A N/A ?Exudate Color: ?Distinct, outline attached N/A N/A ?Wound Margin: ?Large (67-100%) N/A N/A ?Granulation A mount: ?Red, Hyper-granulation, Friable N/A N/A ?Granulation Quality: ?Small (1-33%) N/A N/A ?Necrotic A mount: ?Fat Layer (Subcutaneous Tissue): Yes N/A N/A ?Exposed Structures: ?Fascia: No ?Tendon: No ?Muscle: No ?Joint: No ?Bone: No ?Large (67-100%) N/A N/A ?Epithelialization: ?Chemical Cauterization N/A N/A ?Procedures Performed: ?  Treatment Notes ?Wound #1 (Flank) Wound Laterality: Right ?Cleanser ?Wound Cleanser ?Discharge Instruction: Cleanse the wound with wound cleanser prior to applying a clean dressing using gauze sponges, not tissue or cotton balls. ?Peri-Wound Care ?Skin Prep ?Discharge Instruction: Use skin prep as directed ?Topical ?Primary Dressing ?Santyl Ointment ?Discharge Instruction: Apply ***IN CLINIC ONLY*** ?MediHoney Gel, tube 1.5 (oz) ?Discharge Instruction: Apply to wound bed DAILY AT HOME. ?Secondary Dressing ?Woven Gauze Sponge, Non-Sterile 4x4 in ?Discharge Instruction: Apply over primary dressing as directed. ?Zetuvit Plus Silicone Border Dressing 4x4 (in/in) ?Discharge Instruction: Apply silicone border over primary dressing as directed. ?Secured With ?Compression Wrap ?Compression Stockings ?Add-Ons ?Electronic Signature(s) ?Signed: 04/03/2021 12:35:41 PM By: [name] DO ?Entered By: [name] on 04/03/2021 12:31:22 ?-------------------------------------------------------------------------------- ?Multi-Disciplinary Care Plan Details ?Patient Name: ?Date of Service: ?Noah King, Noah C. 04/03/2021 11:00 A M ?Medical Record Number: [id number] ?Patient Account Number: [id number] ?Date  of Birth/Sex: ?Treating RN: ?1945/04/24 (76 y.o. [name], [name] ?Primary Care Elzada Pytel: [name] ?Other Clinician: ?Referring Corine Solorio: ?Treating Alferd Obryant/Extender: [name] ?Nafziger, [name] ?Weeks in Treatment: 4 ?Active Inactive ?Wound/Skin Impairment ?Nursing Diagnoses: ?Impaired tissue integrity ?Knowledge deficit related to ulceration/compromised skin integrity ?Goals: ?Patient will have a decrease in wound volume by X% from date: (specify in notes) ?Date Initiated: 03/06/2021 ?Target Resolution Date: 04/13/2021 ?Goal Status: Active ?Patient/caregiver will verbalize understanding of skin care regimen ?Date Initiated: 03/06/2021 ?Target Resolution Date: 04/13/2021 ?Goal Status: Active ?Ulcer/skin breakdown will have a volume reduction of 30% by week 4 ?Date Initiated: 03/06/2021 ?Target Resolution Date: 04/13/2021 ?Goal Status: Active ?Interventions: ?Assess patient/caregiver ability to obtain necessary supplies ?Assess patient/caregiver ability to perform ulcer/skin care regimen upon admission and as needed ?Assess ulceration(s) every visit ?Notes: ?Electronic Signature(s) ?Signed: 04/03/2021 5:22:10 PM By: [name] ?Entered By: Rhae Hammock on 04/03/2021 11:41:00 ?-------------------------------------------------------------------------------- ?Pain Assessment Details ?Patient Name: ?Date of Service: ?Noah King, Noah C. 04/03/2021 11:00 A M ?Medical Record Number: [id number] ?Patient Account Number: [id number] ?Date of Birth/Sex: ?Treating RN: ?1945-09-11 (76 y.o. Marcheta Grammes ?Primary Care Philipp Callegari: [name] ?Other Clinician: ?Referring Evangelyn Crouse: ?Treating Amillia Biffle/Extender: [name] ?Nafziger, [name] ?Weeks in Treatment: 4 ?Active Problems ?Location of Pain Severity and Description of Pain ?Patient Has Paino No ?Site Locations ?Pain Management and Medication ?Current Pain Management: ?Electronic Signature(s) ?Signed: 04/03/2021 4:35:21 PM By: Lorrin Jackson ?Entered  By: Lorrin Jackson on 04/03/2021 11:18:42 ?-------------------------------------------------------------------------------- ?Patient/Caregiver Education Details ?Patient Name: ?Date of Service: ?Noah King,

## 2021-04-03 NOTE — Progress Notes (Signed)
WILMAR, PRABHAKAR ([id number]) ?Visit Report for 04/03/2021 ?Chief Complaint Document Details ?Patient Name: Date of Service: ?Noah King, Noah C. 04/03/2021 11:00 A M ?Medical Record Number: [id number] ?Patient Account Number: [id number] ?Date of Birth/Sex: Treating RN: ?02-08-1945 (76 y.o. M) ?Primary Care Provider: [name]: ?Referring Provider: ?Treating Provider/Extender: [name] ?Nafziger, [name] ?Weeks in Treatment: 4 ?Information Obtained from: Patient ?Chief Complaint ?Right-sided hip wound ?Electronic Signature(s) ?Signed: 04/03/2021 12:35:41 PM By: [name] DO ?Entered By: [name] on 04/03/2021 12:31:33 ?-------------------------------------------------------------------------------- ?HPI Details ?Patient Name: Date of Service: ?Noah King, Noah C. 04/03/2021 11:00 A M ?Medical Record Number: [id number] ?Patient Account Number: [id number] ?Date of Birth/Sex: Treating RN: ?July 05, 1945 (76 y.o. M) ?Primary Care Provider: [name]: ?Referring Provider: ?Treating Provider/Extender: [name] ?Nafziger, [name] ?Weeks in Treatment: 4 ?History of Present Illness ?HPI Description: Admission 03/06/2021 ?[name] is a 76 year old male with a past medical history of hypothyroidism and essential hypertension who presents to the clinic for a 41-month?history of nonhealing ulcer to the right side. He states that he is not sure how this started but guesses that it is from pressure sleeping on his right side along ?with a heated water bed potentially causing a burn. He had the exact wound on his left side but this is healed. He reports no pain to the area. He denies signs ?of infection. He has been using antibiotic ointment daily on the wound bed. ?2/28; patient presents for follow-up. He was unable to obtain Santyl due to cost but did start Medihoney. He has no issues or complaints today. He denies ?signs of infection. ?3/7; patient presents  for follow-up. He has been using Medihoney to the wound bed without issues. He denies signs of infection. ?3/21; patient presents for follow-up. He continues to use Medihoney without issues. He has no questions or concerns today. ?Electronic Signature(s) ?Signed: 04/03/2021 12:35:41 PM By: HKalman ShanDO ?Entered By: HKalman Shanon 04/03/2021 12:32:00 ?-------------------------------------------------------------------------------- ?Chemical Cauterization Details ?Patient Name: Date of Service: ?Noah King, Noah C. 04/03/2021 11:00 A M ?Medical Record Number: [id number]?Patient Account Number: [id number]?Date of Birth/Sex: Treating RN: ?105-07-1945((76y.o. MBurnadette Pop [name] ?Primary Care Provider: NDorothyann PengOther Clinician: ?Referring Provider: ?Treating Provider/Extender: HKalman Shan?Nafziger, CTommi Rumps?Weeks in Treatment: 4 ?Procedure Performed for: Wound #1 Right Flank ?Performed By: Physician HKalman Shan DO ?Post Procedure Diagnosis ?Same as Pre-procedure ?Electronic Signature(s) ?Signed: 04/03/2021 12:35:41 PM By: HKalman ShanDO ?Signed: 04/03/2021 5:22:10 PM By: BRhae HammockRN ?Entered By: BRhae Hammockon 04/03/2021 11:48:00 ?-------------------------------------------------------------------------------- ?Physical Exam Details ?Patient Name: Date of Service: ?Noah King, Noah C. 04/03/2021 11:00 A M ?Medical Record Number: [id number]?Patient Account Number: [id number]?Date of Birth/Sex: Treating RN: ?[birth year]((76y.o. M) ?Primary Care Provider: NDorothyann PengOther Clinician: ?Referring Provider: ?Treating Provider/Extender: HKalman Shan?Nafziger, CTommi Rumps?Weeks in Treatment: 4 ?Constitutional ?respirations regular, non-labored and within target range for patient..Marland Kitchen?Psychiatric ?pleasant and cooperative. ?Notes ?Right side: Open wound with hypergranulation tissue present. Appears well-healing. ?Electronic Signature(s) ?Signed: 04/03/2021 12:35:41 PM By: HKalman Shan DO ?Entered By: HKalman Shanon 04/03/2021 12:32:41 ?-------------------------------------------------------------------------------- ?Physician Orders Details ?Patient Name: Date of Service: ?Noah King, Noah C. 04/03/2021 11:00 A M ?Medical Record Number: [id number]?Patient Account Number: [id number]?Date of Birth/Sex: Treating RN: ?1Aug 07, 1947((76y.o. MMarcheta Grammes?Primary Care Provider: NDorothyann PengOther Clinician: ?Referring Provider: ?Treating Provider/Extender: HKalman Shan?Nafziger, CTommi Rumps?Weeks in Treatment: 4 ?Verbal / Phone Orders: No ?Diagnosis Coding ?Follow-up Appointments ?ppointment in 2 weeks. - Dr. HHeber Carolinaw/ [facility]  Room # 9 ?Return A ?Bathing/ Shower/ Hygiene ?May shower with protection but do not get wound dressing(s) wet. ?Off-Loading ?Turn and reposition every 2 hours ?Other: - Reduce pressure to right hip/flank; try to alternate between left hip and back for sleeping!! ?Wound Treatment ?Wound #1 - Flank Wound Laterality: Right ?Cleanser: Wound Cleanser (DME) (Generic) 1 x Per Day/15 Days ?Discharge Instructions: Cleanse the wound with wound cleanser prior to applying a clean dressing using gauze sponges, not tissue or cotton balls. ?Peri-Wound Care: Skin Prep (DME) (Generic) 1 x Per Day/15 Days ?Discharge Instructions: Use skin prep as directed ?Prim Dressing: Santyl Ointment 1 x Per LXB/26 Days ?ary ?Discharge Instructions: Apply ***IN CLINIC ONLY*** ?Prim Dressing: MediHoney Gel, tube 1.5 (oz) 1 x Per Day/15 Days ?ary ?Discharge Instructions: Apply to wound bed DAILY AT HOME. ?Secondary Dressing: Woven Gauze Sponge, Non-Sterile 4x4 in (DME) (Generic) 1 x Per Day/15 Days ?Discharge Instructions: Apply over primary dressing as directed. ?Secondary Dressing: Zetuvit Plus Silicone Border Dressing 4x4 (in/in) (DME) (Generic) 1 x Per Day/15 Days ?Discharge Instructions: Apply silicone border over primary dressing as directed. ?Electronic Signature(s) ?Signed: 04/03/2021 12:35:41  PM By: [name] DO ?Entered By: [name] on 04/03/2021 12:33:05 ?-------------------------------------------------------------------------------- ?Problem List Details ?Patient Name: ?Date of Service: ?Noah King, Noah C. 04/03/2021 11:00 A M ?Medical Record Number: [id number] ?Patient Account Number: [id number] ?Date of Birth/Sex: ?Treating RN: ?1945/10/16 (76 y.o. M) ?Primary Care Provider: [name] ?Other Clinician: ?Referring Provider: ?Treating Provider/Extender: [name] ?Nafziger, [name] ?Weeks in Treatment: 4 ?Active Problems ?ICD-10 ?Encounter ?Code Description Active Date MDM ?Diagnosis ?L89.210 Pressure ulcer of right hip, unstageable 03/06/2021 No Yes ?I10 Essential (primary) hypertension 03/06/2021 No Yes ?E03.9 Hypothyroidism, unspecified 03/06/2021 No Yes ?Inactive Problems ?Resolved Problems ?Electronic Signature(s) ?Signed: 04/03/2021 12:35:41 PM By: [name] DO ?Signed: 04/03/2021 12:35:41 PM By: [name] DO ?Entered By: [name] on 04/03/2021 12:31:15 ?-------------------------------------------------------------------------------- ?Progress Note Details ?Patient Name: Date of Service: ?Noah King, Noah C. 04/03/2021 11:00 A M ?Medical Record Number: [id number] ?Patient Account Number: [id number] ?Date of Birth/Sex: Treating RN: ?[birth year] (76 y.o. M) ?Primary Care Provider: [name]: ?Referring Provider: ?Treating Provider/Extender: [name] ?Nafziger, [name] ?Weeks in Treatment: 4 ?Subjective ?Chief Complaint ?Information obtained from Patient ?Right-sided hip wound ?History of Present Illness (HPI) ?Admission 03/06/2021 ?[name] is a 76 year old male with a past medical history of hypothyroidism and essential hypertension who presents to the clinic for a 64-month?history of nonhealing ulcer to the right side. He states that he is not sure how this started but guesses that it is from pressure sleeping on his right  side along ?with a heated water bed potentially causing a burn. He had the exact wound on his left side but this is healed. He reports no pain to the area. He denies signs ?of infection. He has been usi

## 2021-04-04 DIAGNOSIS — S31109A Unspecified open wound of abdominal wall, unspecified quadrant without penetration into peritoneal cavity, initial encounter: Secondary | ICD-10-CM | POA: Diagnosis not present

## 2021-04-17 ENCOUNTER — Encounter (HOSPITAL_BASED_OUTPATIENT_CLINIC_OR_DEPARTMENT_OTHER): Payer: Medicare HMO | Attending: General Surgery | Admitting: Internal Medicine

## 2021-04-17 DIAGNOSIS — L89210 Pressure ulcer of right hip, unstageable: Secondary | ICD-10-CM | POA: Diagnosis not present

## 2021-04-17 DIAGNOSIS — E039 Hypothyroidism, unspecified: Secondary | ICD-10-CM | POA: Insufficient documentation

## 2021-04-17 DIAGNOSIS — I10 Essential (primary) hypertension: Secondary | ICD-10-CM | POA: Diagnosis not present

## 2021-04-17 NOTE — Progress Notes (Signed)
IVIN, ROSENBLOOM ([id number]) ?Visit Report for 04/17/2021 ?Chief Complaint Document Details ?Patient Name: Date of Service: ?King, Noah C. 04/17/2021 12:30 PM ?Medical Record Number: [id number] ?Patient Account Number: [id number] ?Date of Birth/Sex: Treating RN: ?[birth year] (76 y.o. Noah King, [name] ?Primary Care Provider: [name]: ?Referring Provider: ?Treating Provider/Extender: [name] ?Nafziger, [name] ?Weeks in Treatment: 6 ?Information Obtained from: Patient ?Chief Complaint ?Right-sided hip wound ?Electronic Signature(s) ?Signed: 04/17/2021 3:51:06 PM By: [name] DO ?Entered By: [name] on 04/17/2021 13:28:49 ?-------------------------------------------------------------------------------- ?HPI Details ?Patient Name: Date of Service: ?King, Noah C. 04/17/2021 12:30 PM ?Medical Record Number: [id number] ?Patient Account Number: [id number] ?Date of Birth/Sex: Treating RN: ?Dec 16, 1945 (76 y.o. Noah King, [name] ?Primary Care Provider: [name]: ?Referring Provider: ?Treating Provider/Extender: [name] ?Nafziger, [name] ?Weeks in Treatment: 6 ?History of Present Illness ?HPI Description: Admission 03/06/2021 ?Mr. Matheau Orona is a 76 year old male with a past medical history of hypothyroidism and essential hypertension who presents to the clinic for a 90-month?history of nonhealing ulcer to the right side. He states that he is not sure how this started but guesses that it is from pressure sleeping on his right side along ?with a heated water bed potentially causing a burn. He had the exact wound on his left side but this is healed. He reports no pain to the area. He denies signs ?of infection. He has been using antibiotic ointment daily on the wound bed. ?2/28; patient presents for follow-up. He was unable to obtain Santyl due to cost but did start Medihoney. He has no issues or complaints today. He denies ?signs of  infection. ?3/7; patient presents for follow-up. He has been using Medihoney to the wound bed without issues. He denies signs of infection. ?3/21; patient presents for follow-up. He continues to use Medihoney without issues. He has no questions or concerns today. ?4/4; patient presents for follow-up. He has been using Medihoney to the wound bed with benefit. He reports improvement in wound healing. He has no issues or ?complaints today. ?Electronic Signature(s) ?Signed: 04/17/2021 3:51:06 PM By: HKalman ShanDO ?Entered By: HKalman Shanon 04/17/2021 13:29:35 ?-------------------------------------------------------------------------------- ?Physical Exam Details ?Patient Name: Date of Service: ?King, Noah C. 04/17/2021 12:30 PM ?Medical Record Number: [id number]?Patient Account Number: [id number]?Date of Birth/Sex: Treating RN: ?101/08/1945((76y.o. MBurnadette King [name] ?Primary Care Provider: NDorothyann PengOther Clinician: ?Referring Provider: ?Treating Provider/Extender: HKalman Shan?Nafziger, CTommi Rumps?Weeks in Treatment: 6 ?Constitutional ?respirations regular, non-labored and within target range for patient..Marland Kitchen?Psychiatric ?pleasant and cooperative. ?Notes ?Right side: Epithelization to the previous wound site. Surrounding skin intact. No signs of infection. ?Electronic Signature(s) ?Signed: 04/17/2021 3:51:06 PM By: HKalman ShanDO ?Entered By: HKalman Shanon 04/17/2021 13:30:10 ?-------------------------------------------------------------------------------- ?Physician Orders Details ?Patient Name: Date of Service: ?[name], [name] 04/17/2021 12:30 PM ?Medical Record Number: [id number]?Patient Account Number: [id number]?Date of Birth/Sex: Treating RN: ?110-16-1947((76y.o. MBurnadette King [name] ?Primary Care Provider: NDorothyann PengOther Clinician: ?Referring Provider: ?Treating Provider/Extender: HKalman Shan?Nafziger, CTommi Rumps?Weeks in Treatment: 6 ?Verbal / Phone Orders: No ?Diagnosis  Coding ?[facility] Medications ?llergies: Lotensin ?A ?Notifications Medication Indication Start End ?04/17/2021 ?Dakin's Solution ?DOSE 1 - miscellaneous 0.125 % solution - moisten gauze for wet to dry dressings ?Electronic Signature(s) ?Signed: 04/17/2021 1:40:17 PM By: HKalman ShanDO ?Entered By: HKalman Shanon 04/17/2021 13:40:17 ?-------------------------------------------------------------------------------- ?Problem List Details ?Patient Name: Date of Service: ?King, Noah C. 04/17/2021 12:30 PM ?Medical Record Number: [id number]?Patient  Account Number: [id number] ?Date of Birth/Sex: Treating RN: ?1945/02/15 (76 y.o. Noah King, [name] ?Primary Care Provider: [name]: ?Referring Provider: ?Treating Provider/Extender: [name] ?Nafziger, [name] ?Weeks in Treatment: 6 ?Active Problems ?ICD-10 ?Encounter ?Code Description Active Date MDM ?Diagnosis ?L89.210 Pressure ulcer of right hip, unstageable 03/06/2021 No Yes ?I10 Essential (primary) hypertension 03/06/2021 No Yes ?E03.9 Hypothyroidism, unspecified 03/06/2021 No Yes ?Inactive Problems ?Resolved Problems ?Electronic Signature(s) ?Signed: 04/17/2021 3:51:06 PM By: [name] DO ?Entered By: [name] on 04/17/2021 13:28:33 ?-------------------------------------------------------------------------------- ?Progress Note Details ?Patient Name: Date of Service: ?King, Noah C. 04/17/2021 12:30 PM ?Medical Record Number: [id number] ?Patient Account Number: [id number] ?Date of Birth/Sex: Treating RN: ?[birth year] (76 y.o. Noah King, [name] ?Primary Care Provider: [name]: ?Referring Provider: ?Treating Provider/Extender: [name] ?Nafziger, [name] ?Weeks in Treatment: 6 ?Subjective ?Chief Complaint ?Information obtained from Patient ?Right-sided hip wound ?History of Present Illness (HPI) ?Admission 03/06/2021 ?[name] is a 76 year old male with a past medical history of hypothyroidism and essential hypertension who presents to the clinic for a 2-month?history of nonhealing ulcer to the right side. He states that he is not sure how this started but guesses that it is from pressure sleeping on his right side along ?with a heated water bed potentially causing a burn. He had the exact wound on his left side but this is healed. He reports no pain to the area. He denies signs ?of infection. He has been using antibiotic ointment daily on the wound bed. ?2/28; patient presents for follow-up. He was unable to obtain Santyl due to cost but did start Medihoney. He has no issues or complaints today. He denies ?signs of infection. ?3/7; patient presents for follow-up. He has been using Medihoney to the wound bed without issues. He denies signs of infection. ?3/21; patient presents for follow-up. He continues to use Medihoney without issues. He has no questions or concerns today. ?4/4; patient presents for follow-up. He has been using Medihoney to the wound bed with benefit. He reports improvement in wound healing. He has no issues or ?complaints today. ?Patient History ?Information obtained from Patient. ?Family History ?Unknown History. ?Social History ?Former smoker, Marital Status - Divorced, Alcohol Use - Rarely, Drug Use - No History, Caffeine Use - Rarely. ?Medical History ?Eyes ?Patient has history of Cataracts ?Denies history of Glaucoma, Optic Neuritis ?Hematologic/Lymphatic ?Denies history of Anemia, Hemophilia, Human Immunodeficiency Virus, Lymphedema, Sickle Cell Disease ?Cardiovascular ?Patient has history of Hypertension ?Hospitalization/Surgery History - cataract surgery. - colonoscopy. - dupuytren contracture release. - polypetomy. ?Medical A Surgical History Notes ?nd ?Hematologic/Lymphatic ?polycythemia hx of ?Gastrointestinal ?GERD ?Objective ?Constitutional ?respirations regular, non-labored and within target  range for patient..Marland Kitchen?Vitals Time Taken: 12:43 PM, Height: 71 in, Weight: 175 lbs, BMI: 24.4, Temperature: 98.8 ??F, Pulse: 78 bpm, Respiratory Rate: 17 breaths/min, Blood ?Pressure: 145/77 mmHg. ?Psychiatric ?pleasan

## 2021-04-17 NOTE — Progress Notes (Signed)
GIANNY, SABINO ([id number]) ?Visit Report for 04/17/2021 ?Arrival Information Details ?Patient Name: Date of Service: ?Noah King, Noah C. 04/17/2021 12:30 PM ?Medical Record Number: [id number] ?Patient Account Number: [id number] ?Date of Birth/Sex: Treating RN: ?Mar 27, 1945 (76 y.o. Noah King, Noah King ?Primary Care [name]: [name]: ?Referring [name]: ?Treating [name]/Extender: [name] ?Nafziger, Noah King ?Weeks in Treatment: 6 ?Visit Information History Since Last Visit ?Added or deleted any medications: No ?Patient Arrived: Ambulatory ?Any new allergies or adverse reactions: No ?Arrival Time: 12:43 ?Had a fall or experienced change in No ?Accompanied By: self ?activities of daily living that may affect ?Transfer Assistance: None ?risk of falls: ?Patient Identification Verified: Yes ?Signs or symptoms of abuse/neglect since last visito No ?Secondary Verification Process Completed: Yes ?Hospitalized since last visit: No ?Patient Requires Transmission-Based Precautions: No ?Implantable device outside of the clinic excluding No ?Patient Has Alerts: No ?cellular tissue based products placed in the center ?since last visit: ?Has Dressing in Place as Prescribed: Yes ?Pain Present Now: No ?Electronic Signature(s) ?Signed: 04/17/2021 4:10:53 PM By: [name] ?Entered By: Rhae Hammock on 04/17/2021 12:43:47 ?-------------------------------------------------------------------------------- ?Clinic Level of Care Assessment Details ?Patient Name: Date of Service: ?Noah King, Noah C. 04/17/2021 12:30 PM ?Medical Record Number: [id number] ?Patient Account Number: [id number] ?Date of Birth/Sex: Treating RN: ?07-28-1945 (76 y.o. Noah King, Noah King ?Primary Care Shareese Macha: [name]: ?Referring Danyeal Akens: ?Treating Ramatoulaye Pack/Extender: [name] ?Nafziger, Noah King ?Weeks in Treatment: 6 ?Clinic Level of Care Assessment Items ?TOOL 4 Quantity Score ?X- 1 0 ?Use when  only an EandM is performed on FOLLOW-UP visit ?ASSESSMENTS - Nursing Assessment / Reassessment ?X- 1 10 ?Reassessment of Co-morbidities (includes updates in patient status) ?X- 1 5 ?Reassessment of Adherence to Treatment Plan ?ASSESSMENTS - Wound and Skin A ssessment / Reassessment ?X - Simple Wound Assessment / Reassessment - one wound 1 5 ?'[]'$  - 0 ?Complex Wound Assessment / Reassessment - multiple wounds ?'[]'$  - 0 ?Dermatologic / Skin Assessment (not related to wound area) ?ASSESSMENTS - Focused Assessment ?'[]'$  - 0 ?Circumferential Edema Measurements - multi extremities ?'[]'$  - 0 ?Nutritional Assessment / Counseling / Intervention ?'[]'$  - 0 ?Lower Extremity Assessment (monofilament, tuning fork, pulses) ?'[]'$  - 0 ?Peripheral Arterial Disease Assessment (using hand held doppler) ?ASSESSMENTS - Ostomy and/or Continence Assessment and Care ?'[]'$  - 0 ?Incontinence Assessment and Management ?'[]'$  - 0 ?Ostomy Care Assessment and Management (repouching, etc.) ?PROCESS - Coordination of Care ?X - Simple Patient / Family Education for ongoing care 1 15 ?'[]'$  - 0 ?Complex (extensive) Patient / Family Education for ongoing care ?X- 1 10 ?Staff obtains Consents, Records, T Results / Process Orders ?est ?'[]'$  - 0 ?Staff telephones HHA, Nursing Homes / Clarify orders / etc ?'[]'$  - 0 ?Routine Transfer to another Facility (non-emergent condition) ?'[]'$  - 0 ?Routine Hospital Admission (non-emergent condition) ?'[]'$  - 0 ?New Admissions / Biomedical engineer / Ordering NPWT Apligraf, etc. ?, ?'[]'$  - 0 ?Emergency Hospital Admission (emergent condition) ?X- 1 10 ?Simple Discharge Coordination ?'[]'$  - 0 ?Complex (extensive) Discharge Coordination ?PROCESS - Special Needs ?'[]'$  - 0 ?Pediatric / Minor Patient Management ?'[]'$  - 0 ?Isolation Patient Management ?'[]'$  - 0 ?Hearing / Language / Visual special needs ?'[]'$  - 0 ?Assessment of Community assistance (transportation, D/C planning, etc.) ?'[]'$  - 0 ?Additional assistance / Altered mentation ?'[]'$  - 0 ?Support  Surface(s) Assessment (bed, cushion, seat, etc.) ?INTERVENTIONS - Wound Cleansing / Measurement ?X - Simple Wound Cleansing - one wound 1 5 ?'[]'$  - 0 ?Complex Wound Cleansing - multiple wounds ?X- 1 5 ?  Wound Imaging (photographs - any number of wounds) ?'[]'$  - 0 ?Wound Tracing (instead of photographs) ?X- 1 5 ?Simple Wound Measurement - one wound ?'[]'$  - 0 ?Complex Wound Measurement - multiple wounds ?INTERVENTIONS - Wound Dressings ?'[]'$  - 0 ?Small Wound Dressing one or multiple wounds ?'[]'$  - 0 ?Medium Wound Dressing one or multiple wounds ?'[]'$  - 0 ?Large Wound Dressing one or multiple wounds ?'[]'$  - 0 ?Application of Medications - topical ?'[]'$  - 0 ?Application of Medications - injection ?INTERVENTIONS - Miscellaneous ?'[]'$  - 0 ?External ear exam ?'[]'$  - 0 ?Specimen Collection (cultures, biopsies, blood, body fluids, etc.) ?'[]'$  - 0 ?Specimen(s) / Culture(s) sent or taken to Lab for analysis ?'[]'$  - 0 ?Patient Transfer (multiple staff / Civil Service fast streamer / Similar devices) ?'[]'$  - 0 ?Simple Staple / Suture removal (25 or less) ?'[]'$  - 0 ?Complex Staple / Suture removal (26 or more) ?'[]'$  - 0 ?Hypo / Hyperglycemic Management (close monitor of Blood Glucose) ?'[]'$  - 0 ?Ankle / Brachial Index (ABI) - do not check if billed separately ?X- 1 5 ?Vital Signs ?Has the patient been seen at the hospital within the last three years: Yes ?Total Score: 75 ?Level Of Care: New/Established - Level 2 ?Electronic Signature(s) ?Signed: 04/17/2021 4:10:53 PM By: [name] ?Entered By: Rhae Hammock on 04/17/2021 13:02:14 ?-------------------------------------------------------------------------------- ?Lower Extremity Assessment Details ?Patient Name: Date of Service: ?Noah King, Noah C. 04/17/2021 12:30 PM ?Medical Record Number: [id number] ?Patient Account Number: [id number] ?Date of Birth/Sex: Treating RN: ?[birth year] (76 y.o. Noah King, Noah King ?Primary Care Brysin Towery: [name]: ?Referring Wyatt Galvan: ?Treating Jasmina Gendron/Extender:  [name] ?Nafziger, Noah King ?Weeks in Treatment: 6 ?Electronic Signature(s) ?Signed: 04/17/2021 4:10:53 PM By: [name] ?Entered By: Rhae Hammock on 04/17/2021 12:46:26 ?-------------------------------------------------------------------------------- ?Multi Wound Chart Details ?Patient Name: Date of Service: ?Noah King, Noah C. 04/17/2021 12:30 PM ?Medical Record Number: [id number] ?Patient Account Number: [id number] ?Date of Birth/Sex: Treating RN: ?08-20-1945 (76 y.o. Noah King, Noah King ?Primary Care Leily Capek: [name]: ?Referring Conor Filsaime: ?Treating Markese Bloxham/Extender: [name] ?Nafziger, Noah King ?Weeks in Treatment: 6 ?Vital Signs ?Height(in): 71 ?Pulse(bpm): 78 ?Weight(lbs): 175 ?Blood Pressure(mmHg): 145/77 ?Body Mass Index(BMI): 24.4 ?Temperature(??F): 98.8 ?Respiratory Rate(breaths/min): 17 ?Photos: [N/A:N/A] ?Right Flank N/A N/A ?Wound Location: ?Pressure Injury N/A N/A ?Wounding Event: ?Pressure Ulcer N/A N/A ?Primary Etiology: ?Cataracts, Hypertension N/A N/A ?Comorbid History: ?01/07/2021 N/A N/A ?Date Acquired: ?6 N/A N/A ?Weeks of Treatment: ?Open N/A N/A ?Wound Status: ?No N/A N/A ?Wound Recurrence: ?0x0x0 N/A N/A ?Measurements L x W x D (cm) ?0 N/A N/A ?A (cm?) : ?rea ?0 N/A N/A ?Volume (cm?) : ?100.00% N/A N/A ?% Reduction in A rea: ?100.00% N/A N/A ?% Reduction in Volume: ?Category/Stage III N/A N/A ?Classification: ?Medium N/A N/A ?Exudate A mount: ?Serosanguineous N/A N/A ?Exudate Type: ?red, brown N/A N/A ?Exudate Color: ?Distinct, outline attached N/A N/A ?Wound Margin: ?Large (67-100%) N/A N/A ?Granulation A mount: ?Red, Hyper-granulation, Friable N/A N/A ?Granulation Quality: ?Small (1-33%) N/A N/A ?Necrotic A mount: ?Fat Layer (Subcutaneous Tissue): Yes N/A N/A ?Exposed Structures: ?Fascia: No ?Tendon: No ?Muscle: No ?Joint: No ?Bone: No ?Large (67-100%) N/A N/A ?Epithelialization: ?Treatment Notes ?Electronic Signature(s) ?Signed: 04/17/2021  3:51:06 PM By: [name] DO ?Signed: 04/17/2021 4:10:53 PM By: [name] ?Entered By: [name] on 04/17/2021 13:28:40 ?---------------------------------------------------------------------------

## 2021-05-24 DIAGNOSIS — H5203 Hypermetropia, bilateral: Secondary | ICD-10-CM | POA: Diagnosis not present

## 2021-10-30 ENCOUNTER — Telehealth: Payer: Self-pay | Admitting: Adult Health

## 2021-10-30 NOTE — Telephone Encounter (Signed)
Left message for patient to call back and schedule Medicare Annual Wellness Visit (AWV) either virtually or in office. Left  my jabber number [phone]    Last AWV 10/31/20 please schedule with Nurse Health Adviser   45 min for awv-i and in office appointments 30 min for awv-s  phone/virtual appointments  

## 2021-11-01 ENCOUNTER — Ambulatory Visit (INDEPENDENT_AMBULATORY_CARE_PROVIDER_SITE_OTHER): Payer: Medicare HMO

## 2021-11-01 VITALS — Ht 71.0 in | Wt 174.0 lb

## 2021-11-01 DIAGNOSIS — Z0000 Encounter for general adult medical examination without abnormal findings: Secondary | ICD-10-CM | POA: Diagnosis not present

## 2021-11-01 NOTE — Patient Instructions (Addendum)
Noah King , Thank you for taking time to come for your Medicare Wellness Visit. I appreciate your ongoing commitment to your health goals. Please review the following plan we discussed and let me know if I can assist you in the future.   These are the goals we discussed:  Goals       Decrease the likelihood of falling      Exercise 150 minutes per week (moderate activity)      Increase physical activity (pt-stated)        This is a list of the screening recommended for you and due dates:  Health Maintenance  Topic Date Due   Zoster (Shingles) Vaccine (1 of 2) 02/01/2022*   Flu Shot  04/14/2022*   Tetanus Vaccine  11/02/2022*   Pneumonia Vaccine  Completed   COVID-19 Vaccine  Completed   Hepatitis C Screening: USPSTF Recommendation to screen - Ages 18-79 yo.  Completed   HPV Vaccine  Aged Out   Colon Cancer Screening  Discontinued  *Topic was postponed. The date shown is not the original due date.    Advanced directives: Please bring a copy of your health care power of attorney and living will to the office to be added to your chart at your convenience.   Conditions/risks identified: None  Next appointment: Follow up in one year for your annual wellness visit.    Preventive Care 61 Years and Older, Male  Preventive care refers to lifestyle choices and visits with your health care provider that can promote health and wellness. What does preventive care include? A yearly physical exam. This is also called an annual well check. Dental exams once or twice a year. Routine eye exams. Ask your health care provider how often you should have your eyes checked. Personal lifestyle choices, including: Daily care of your teeth and gums. Regular physical activity. Eating a healthy diet. Avoiding tobacco and drug use. Limiting alcohol use. Practicing safe sex. Taking low doses of aspirin every day. Taking vitamin and mineral supplements as recommended by your health care  provider. What happens during an annual well check? The services and screenings done by your health care provider during your annual well check will depend on your age, overall health, lifestyle risk factors, and family history of disease. Counseling  Your health care provider may ask you questions about your: Alcohol use. Tobacco use. Drug use. Emotional well-being. Home and relationship well-being. Sexual activity. Eating habits. History of falls. Memory and ability to understand (cognition). Work and work Statistician. Screening  You may have the following tests or measurements: Height, weight, and BMI. Blood pressure. Lipid and cholesterol levels. These may be checked every 5 years, or more frequently if you are over 14 years old. Skin check. Lung cancer screening. You may have this screening every year starting at age 34 if you have a 30-pack-year history of smoking and currently smoke or have quit within the past 15 years. Fecal occult blood test (FOBT) of the stool. You may have this test every year starting at age 75. Flexible sigmoidoscopy or colonoscopy. You may have a sigmoidoscopy every 5 years or a colonoscopy every 10 years starting at age 96. Prostate cancer screening. Recommendations will vary depending on your family history and other risks. Hepatitis C blood test. Hepatitis B blood test. Sexually transmitted disease (STD) testing. Diabetes screening. This is done by checking your blood sugar (glucose) after you have not eaten for a while (fasting). You may have this done every 1-3  years. Abdominal aortic aneurysm (AAA) screening. You may need this if you are a current or former smoker. Osteoporosis. You may be screened starting at age 57 if you are at high risk. Talk with your health care provider about your test results, treatment options, and if necessary, the need for more tests. Vaccines  Your health care provider may recommend certain vaccines, such  as: Influenza vaccine. This is recommended every year. Tetanus, diphtheria, and acellular pertussis (Tdap, Td) vaccine. You may need a Td booster every 10 years. Zoster vaccine. You may need this after age 52. Pneumococcal 13-valent conjugate (PCV13) vaccine. One dose is recommended after age 81. Pneumococcal polysaccharide (PPSV23) vaccine. One dose is recommended after age 77. Talk to your health care provider about which screenings and vaccines you need and how often you need them. This information is not intended to replace advice given to you by your health care provider. Make sure you discuss any questions you have with your health care provider. Document Released: 01/27/2015 Document Revised: 09/20/2015 Document Reviewed: 11/01/2014 Elsevier Interactive Patient Education  2017 [place] Prevention in the Home Falls can cause injuries. They can happen to people of all ages. There are many things you can do to make your home safe and to help prevent falls. What can I do on the outside of my home? Regularly fix the edges of walkways and driveways and fix any cracks. Remove anything that might make you trip as you walk through a door, such as a raised step or threshold. Trim any bushes or trees on the path to your home. Use bright outdoor lighting. Clear any walking paths of anything that might make someone trip, such as rocks or tools. Regularly check to see if handrails are loose or broken. Make sure that both sides of any steps have handrails. Any raised decks and porches should have guardrails on the edges. Have any leaves, snow, or ice cleared regularly. Use sand or salt on walking paths during winter. Clean up any spills in your garage right away. This includes oil or grease spills. What can I do in the bathroom? Use night lights. Install grab bars by the toilet and in the tub and shower. Do not use towel bars as grab bars. Use non-skid mats or decals in the tub or  shower. If you need to sit down in the shower, use a plastic, non-slip stool. Keep the floor dry. Clean up any water that spills on the floor as soon as it happens. Remove soap buildup in the tub or shower regularly. Attach bath mats securely with double-sided non-slip rug tape. Do not have throw rugs and other things on the floor that can make you trip. What can I do in the bedroom? Use night lights. Make sure that you have a light by your bed that is easy to reach. Do not use any sheets or blankets that are too big for your bed. They should not hang down onto the floor. Have a firm chair that has side arms. You can use this for support while you get dressed. Do not have throw rugs and other things on the floor that can make you trip. What can I do in the kitchen? Clean up any spills right away. Avoid walking on wet floors. Keep items that you use a lot in easy-to-reach places. If you need to reach something above you, use a strong step stool that has a grab bar. Keep electrical cords out of the way. Do  not use floor polish or wax that makes floors slippery. If you must use wax, use non-skid floor wax. Do not have throw rugs and other things on the floor that can make you trip. What can I do with my stairs? Do not leave any items on the stairs. Make sure that there are handrails on both sides of the stairs and use them. Fix handrails that are broken or loose. Make sure that handrails are as long as the stairways. Check any carpeting to make sure that it is firmly attached to the stairs. Fix any carpet that is loose or worn. Avoid having throw rugs at the top or bottom of the stairs. If you do have throw rugs, attach them to the floor with carpet tape. Make sure that you have a light switch at the top of the stairs and the bottom of the stairs. If you do not have them, ask someone to add them for you. What else can I do to help prevent falls? Wear shoes that: Do not have high heels. Have  rubber bottoms. Are comfortable and fit you well. Are closed at the toe. Do not wear sandals. If you use a stepladder: Make sure that it is fully opened. Do not climb a closed stepladder. Make sure that both sides of the stepladder are locked into place. Ask someone to hold it for you, if possible. Clearly mark and make sure that you can see: Any grab bars or handrails. First and last steps. Where the edge of each step is. Use tools that help you move around (mobility aids) if they are needed. These include: Canes. Walkers. Scooters. Crutches. Turn on the lights when you go into a dark area. Replace any light bulbs as soon as they burn out. Set up your furniture so you have a clear path. Avoid moving your furniture around. If any of your floors are uneven, fix them. If there are any pets around you, be aware of where they are. Review your medicines with your doctor. Some medicines can make you feel dizzy. This can increase your chance of falling. Ask your doctor what other things that you can do to help prevent falls. This information is not intended to replace advice given to you by your health care provider. Make sure you discuss any questions you have with your health care provider. Document Released: 10/27/2008 Document Revised: 06/08/2015 Document Reviewed: 02/04/2014 Elsevier Interactive Patient Education  2017 [employer].

## 2021-11-01 NOTE — Progress Notes (Signed)
Subjective:   Noah King is a 76 y.o. male who presents for Medicare Annual/Subsequent preventive examination.  Review of Systems   Virtual Visit via Telephone Note  I connected with  Noah King on 11/01/21 at  2:00 PM EDT by telephone and verified that I am speaking with the correct person using two identifiers.  Location: Patient: Home Provider: Office Persons participating in the virtual visit: patient/Nurse Health Advisor   I discussed the limitations, risks, security and privacy concerns of performing an evaluation and management service by telephone and the availability of in person appointments. The patient expressed understanding and agreed to proceed.  Interactive audio and video telecommunications were attempted between this nurse and patient, however failed, due to patient having technical difficulties OR patient did not have access to video capability.  We continued and completed visit with audio only.  Some vital signs may be absent or patient reported.   Criselda Peaches, LPN  Cardiac Risk Factors include: advanced age (>79mn, >>25women);hypertension;male gender     Objective:    Today's Vitals   11/01/21 1412  Weight: 174 lb (78.9 kg)  Height: '5\' 11"'$  (1.803 m)   Body mass index is 24.27 kg/m.     11/01/2021    2:22 PM 10/31/2020    1:56 PM 12/14/2019    1:24 PM 07/13/2014    8:49 AM 06/30/2014    2:53 PM 05/24/2014   10:20 AM 12/14/2013    9:10 AM  Advanced Directives  Does Patient Have a Medical Advance Directive? Yes Yes Yes No No No No  Type of [place]of [place] will [place] will [place] will      Does patient want to make changes to medical advance directive?   No - Patient declined      Copy of [place] Chart? No - copy requested No - copy requested No - copy requested      Would patient like information on creating a  medical advance directive?      No - patient declined information No - patient declined information    Current Medications (verified) Outpatient Encounter Medications as of 11/01/2021  Medication Sig   aspirin 81 MG tablet Take 81 mg by mouth daily.   atorvastatin (LIPITOR) 20 MG tablet TAKE ONE TABLET BY MOUTH DAILY   Ferrous Sulfate (IRON) 325 (65 Fe) MG TABS Take by mouth.   losartan (COZAAR) 25 MG tablet TAKE ONE TABLET BY MOUTH DAILY   Multiple Vitamin (MULTIVITAMIN) tablet Take 1 tablet by mouth daily.   omeprazole (PRILOSEC) 20 MG capsule Take 1 capsule (20 mg total) by mouth daily.   sildenafil (REVATIO) 20 MG tablet Take 1 tablet (20 mg total) by mouth daily as needed.   verapamil (CALAN-SR) 180 MG CR tablet Take 1 tablet every morning   No facility-administered encounter medications on file as of 11/01/2021.    Allergies (verified) Lotensin [benazepril hcl]   History: Past Medical History:  Diagnosis Date   Allergy    Cataract    left  extraction- right eye forming   Dupuytren's contracture    GERD (gastroesophageal reflux disease)    Hiatal hernia    past hx-  suspect at one time   Hypertension    Polycythemia    Past Surgical History:  Procedure Laterality Date   cataract extraction left eye     COLONOSCOPY     DUPUYTREN CONTRACTURE RELEASE  Left    POLYPECTOMY     rt knee arthroscopic     Family History  Problem Relation Age of Onset   Stroke Mother    Colon cancer Father    Breast cancer Sister    Cancer Sister        breast    Colon polyps Neg Hx    Esophageal cancer Neg Hx    Social History   Socioeconomic History   Marital status: Single    Spouse name: Not on file   Number of children: Not on file   Years of education: Not on file   Highest education level: Not on file  Occupational History   Not on file  Tobacco Use   Smoking status: Former    Types: Cigarettes    Quit date: 01/14/1985    Years since quitting: 36.8   Smokeless  tobacco: Never  Vaping Use   Vaping Use: Never used  Substance and Sexual Activity   Alcohol use: Yes    Alcohol/week: 0.0 standard drinks of alcohol    Comment: beer daily    Drug use: No   Sexual activity: Yes    Partners: Female  Other Topics Concern   Not on file  Social History Narrative   Retired- He worked at [employer]   Divorced   One daughter who lives in [place] Strain: [place]  (11/01/2021)   Overall Financial Resource Strain (CARDIA)    Difficulty of Paying Living Expenses: Not hard at all  Food Insecurity: No Food Insecurity (11/01/2021)   Hunger Vital Sign    Worried About Running Out of Food in the Last Year: Never true    [place] in the Last Year: Never true  Transportation Needs: No Transportation Needs (11/01/2021)   PRAPARE - Hydrologist (Medical): No    Lack of Transportation (Non-Medical): No  Physical Activity: Insufficiently Active (11/01/2021)   Exercise Vital Sign    Days of Exercise per Week: 7 days    Minutes of Exercise per Session: 10 min  Stress: No Stress Concern Present (11/01/2021)   [place]    Feeling of Stress : Not at all  Social Connections: Socially Isolated (11/01/2021)   Social Connection and Isolation Panel [NHANES]    Frequency of Communication with Friends and Family: More than three times a week    Frequency of Social Gatherings with Friends and Family: More than three times a week    Attends Religious Services: Never    Marine scientist or Organizations: No    Attends Music therapist: Never    Marital Status: Divorced    Tobacco Counseling Counseling given: Not Answered   Clinical Intake:  Pre-visit preparation completed: Yes  Pain : No/denies pain     BMI - recorded: 24.27 Nutritional Status: BMI of 19-24  Normal Nutritional Risks:  None Diabetes: No  How often do you need to have someone help you when you read instructions, pamphlets, or other written materials from your doctor or pharmacy?: 1 - Never  Diabetic?  No  Interpreter Needed?: No  Information entered by :: Rolene Arbour LPN   Activities of Daily Living    11/01/2021    2:20 PM 10/30/2021    4:43 PM  In your present state of health, do you have any difficulty performing the following activities:  Hearing? 0 0  Vision? 0 0  Difficulty concentrating or making decisions? 0 0  Walking or climbing stairs? 0 1  Dressing or bathing? 0 0  Doing errands, shopping? 0 0  Preparing Food and eating ? N N  Using the Toilet? N N  In the past six months, have you accidently leaked urine? N N  Do you have problems with loss of bowel control? N N  Managing your Medications? N N  Managing your Finances? N N  Housekeeping or managing your Housekeeping? N N    Patient Care Team: [name], NP as PCP - General (Family Medicine) [name], [facility] as Pharmacist (Pharmacist)  Indicate any recent Medical Services you may have received from other than [facility] providers in the past year (date may be approximate).     Assessment:   This is a routine wellness examination for [place].  Hearing/Vision screen Hearing Screening - Comments:: Denies hearing difficulties   Vision Screening - Comments:: Wears rx glasses - up to date with routine eye exams with  Dr Idolina Primer  Dietary issues and exercise activities discussed: Current Exercise Habits: Home exercise routine, Type of exercise: walking, Time (Minutes): 10, Frequency (Times/Week): 7, Weekly Exercise (Minutes/Week): 70, Intensity: Moderate, Exercise limited by: None identified   Goals Addressed               This Visit's Progress     Increase physical activity (pt-stated)         Depression Screen    11/01/2021    2:19 PM 12/12/2020    8:03 AM 10/31/2020    1:54 PM 12/14/2019    1:27 PM  12/08/2019    7:33 AM 11/13/2017    7:01 AM 11/12/2016    7:07 AM  PHQ 2/9 Scores  PHQ - 2 Score 0 0 0 0 0 0 0  PHQ- 9 Score  5  0       Fall Risk    11/01/2021    2:21 PM 10/30/2021    4:43 PM 12/12/2020    8:03 AM 10/31/2020    1:57 PM 12/14/2019    1:26 PM  [place] in the past year? 0 0 1 0 1  Number falls in past yr: 0 0 1 0 0  Injury with Fall? 0 0 0 0 0  Risk for fall due to : No Fall Risks    History of fall(s);Impaired balance/gait  Follow up Falls prevention discussed   Falls evaluation completed Falls evaluation completed;Falls prevention discussed    FALL RISK PREVENTION PERTAINING TO THE HOME:  Any stairs in or around the home? No If so, are there any without handrails? No  Home free of loose throw rugs in walkways, pet beds, electrical cords, etc? Yes  Adequate lighting in your home to reduce risk of falls? Yes   ASSISTIVE DEVICES UTILIZED TO PREVENT FALLS:  Life alert? No  Use of a cane, walker or w/c? No  Grab bars in the bathroom? No  Shower chair or bench in shower? No  Elevated toilet seat or a handicapped toilet? Yes  TIMED UP AND GO:  Was the test performed? No . Audio Visit  Cognitive Function:        11/01/2021    2:22 PM  6CIT Screen  What Year? 0 points  What month? 0 points  What time? 0 points  Count back from 20 0 points  Months in reverse 0 points  Repeat phrase 0  points  Total Score 0 points    Immunizations Immunization History  Administered Date(s) Administered   Fluad Quad(high Dose 65+) 12/02/2018, 12/12/2020   Influenza Split 11/27/2010, 12/19/2011   Influenza Whole 10/20/2008, 11/23/2009   Influenza, High Dose Seasonal PF 11/09/2014, 11/09/2015, 11/12/2016, 11/13/2017, 10/13/2019   Influenza,inj,Quad PF,6+ Mos 12/12/2020   Influenza-Unspecified 10/30/2012, 10/26/2013   PFIZER(Purple Top)SARS-COV-2 Vaccination 02/27/2019, 03/22/2019, 10/13/2019   Pfizer Covid-19 Vaccine Bivalent Booster 77yr & up  06/22/2020, 01/23/2021   Pneumococcal Conjugate-13 05/04/2014   Pneumococcal Polysaccharide-23 03/05/2013   Td 07/14/2000   Tdap 08/26/2011    TDAP status: Up to date  Flu Vaccine status: Up to date  Pneumococcal vaccine status: Up to date  Covid-19 vaccine status: Completed vaccines  Qualifies for Shingles Vaccine? Yes   Zostavax completed No   Shingrix Completed?: No.    Education has been provided regarding the importance of this vaccine. Patient has been advised to call insurance company to determine out of pocket expense if they have not yet received this vaccine. Advised may also receive vaccine at local pharmacy or Health Dept. Verbalized acceptance and understanding.  Screening Tests Health Maintenance  Topic Date Due   Zoster Vaccines- Shingrix (1 of 2) 02/01/2022 (Originally 10/21/1994)   INFLUENZA VACCINE  04/14/2022 (Originally 08/14/2021)   TETANUS/TDAP  11/02/2022 (Originally 08/25/2021)   Pneumonia Vaccine 76 Years old  Completed   COVID-19 Vaccine  Completed   Hepatitis C Screening  Completed   HPV VACCINES  Aged Out   COLONOSCOPY (Pts [phone] coverage will need to be confirmed)  Discontinued    Health Maintenance  There are no preventive care reminders to display for this patient.   Colorectal cancer screening: No longer required.   Lung Cancer Screening: (Low Dose CT Chest recommended if Age 77-80ears, 30 pack-year currently smoking OR have quit w/in 15years.) does not qualify.     Additional Screening:  Hepatitis C Screening: does not qualify; Completed 11/09/14  Vision Screening: Recommended annual ophthalmology exams for early detection of glaucoma and other disorders of the eye. Is the patient up to date with their annual eye exam?  Yes  Who is the provider or what is the name of the office in which the patient attends annual eye exams? Dr DuIdolina Primerf pt is not established with a provider, would they like to be referred to a provider to  establish care? No .   Dental Screening: Recommended annual dental exams for proper oral hygiene  Community Resource Referral / Chronic Care Management:  CRR required this visit?  No   CCM required this visit?  No      Plan:     I have personally reviewed and noted the following in the patient's chart:   Medical and social history Use of alcohol, tobacco or illicit drugs  Current medications and supplements including opioid prescriptions. Patient is not currently taking opioid prescriptions. Functional ability and status Nutritional status Physical activity Advanced directives List of other physicians Hospitalizations, surgeries, and ER visits in previous 12 months Vitals Screenings to include cognitive, depression, and falls Referrals and appointments  In addition, I have reviewed and discussed with patient certain preventive protocols, quality metrics, and best practice recommendations. A written personalized care plan for preventive services as well as general preventive health recommendations were provided to patient.     BeCriselda PeachesLPN   [id number] Nurse Notes: None

## 2021-12-09 ENCOUNTER — Other Ambulatory Visit: Payer: Self-pay | Admitting: Adult Health

## 2021-12-13 ENCOUNTER — Encounter: Payer: Self-pay | Admitting: Adult Health

## 2021-12-13 ENCOUNTER — Ambulatory Visit (INDEPENDENT_AMBULATORY_CARE_PROVIDER_SITE_OTHER): Payer: Medicare HMO | Admitting: Adult Health

## 2021-12-13 VITALS — BP 138/80 | HR 64 | Temp 98.1°F | Ht 70.5 in | Wt 176.0 lb

## 2021-12-13 DIAGNOSIS — Z0000 Encounter for general adult medical examination without abnormal findings: Secondary | ICD-10-CM | POA: Diagnosis not present

## 2021-12-13 DIAGNOSIS — Z125 Encounter for screening for malignant neoplasm of prostate: Secondary | ICD-10-CM | POA: Diagnosis not present

## 2021-12-13 DIAGNOSIS — I739 Peripheral vascular disease, unspecified: Secondary | ICD-10-CM | POA: Diagnosis not present

## 2021-12-13 DIAGNOSIS — E785 Hyperlipidemia, unspecified: Secondary | ICD-10-CM

## 2021-12-13 DIAGNOSIS — I10 Essential (primary) hypertension: Secondary | ICD-10-CM | POA: Diagnosis not present

## 2021-12-13 LAB — CBC WITH DIFFERENTIAL/PLATELET
Basophils Absolute: 0 10*3/uL (ref 0.0–0.1)
Basophils Relative: 0.7 % (ref 0.0–3.0)
Eosinophils Absolute: 0.4 10*3/uL (ref 0.0–0.7)
Eosinophils Relative: 5.4 % — ABNORMAL HIGH (ref 0.0–5.0)
HCT: 43.4 % (ref 39.0–52.0)
Hemoglobin: 14.7 g/dL (ref 13.0–17.0)
Lymphocytes Relative: 19.3 % (ref 12.0–46.0)
Lymphs Abs: 1.3 10*3/uL (ref 0.7–4.0)
MCHC: 33.8 g/dL (ref 30.0–36.0)
MCV: 89.7 fl (ref 78.0–100.0)
Monocytes Absolute: 0.9 10*3/uL (ref 0.1–1.0)
Monocytes Relative: 13.2 % — ABNORMAL HIGH (ref 3.0–12.0)
Neutro Abs: 4 10*3/uL (ref 1.4–7.7)
Neutrophils Relative %: 61.4 % (ref 43.0–77.0)
Platelets: 178 10*3/uL (ref 150.0–400.0)
RBC: 4.83 Mil/uL (ref 4.22–5.81)
RDW: 15 % (ref 11.5–15.5)
WBC: 6.5 10*3/uL (ref 4.0–10.5)

## 2021-12-13 LAB — COMPREHENSIVE METABOLIC PANEL
ALT: 16 U/L (ref 0–53)
AST: 16 U/L (ref 0–37)
Albumin: 4.1 g/dL (ref 3.5–5.2)
Alkaline Phosphatase: 55 U/L (ref 39–117)
BUN: 8 mg/dL (ref 6–23)
CO2: 31 mEq/L (ref 19–32)
Calcium: 9.4 mg/dL (ref 8.4–10.5)
Chloride: 103 mEq/L (ref 96–112)
Creatinine, Ser: 0.75 mg/dL (ref 0.40–1.50)
GFR: 87.27 mL/min (ref >60.00)
Glucose, Bld: 95 mg/dL (ref 70–99)
Potassium: 4.5 mEq/L (ref 3.5–5.1)
Sodium: 140 mEq/L (ref 135–145)
Total Bilirubin: 1 mg/dL (ref 0.2–1.2)
Total Protein: 6.8 g/dL (ref 6.0–8.3)

## 2021-12-13 LAB — LIPID PANEL
Cholesterol: 126 mg/dL (ref 0–200)
HDL: 65.2 mg/dL (ref >39.00)
LDL Cholesterol: 49 mg/dL (ref 0–99)
NonHDL: 60.78
Total CHOL/HDL Ratio: 2
Triglycerides: 58 mg/dL (ref 0.0–149.0)
VLDL: 11.6 mg/dL (ref 0.0–40.0)

## 2021-12-13 LAB — PSA: PSA: 1.58 ng/mL (ref 0.10–4.00)

## 2021-12-13 LAB — TSH: TSH: 1.08 u[IU]/mL (ref 0.35–5.50)

## 2021-12-13 MED ORDER — LOSARTAN POTASSIUM 25 MG PO TABS: 25.0000 mg | ORAL_TABLET | Freq: Every day | ORAL | 3 refills | Status: DC

## 2021-12-13 MED ORDER — ATORVASTATIN CALCIUM 20 MG PO TABS: 20.0000 mg | ORAL_TABLET | Freq: Every day | ORAL | 3 refills | Status: DC

## 2021-12-13 MED ORDER — VERAPAMIL HCL ER 180 MG PO TBCR: EXTENDED_RELEASE_TABLET | ORAL | 3 refills | Status: DC

## 2021-12-13 NOTE — Progress Notes (Signed)
Subjective:    Patient ID: Noah King, male    DOB: August 30, 1945, 76 y.o.   MRN: [id number]  HPI Patient presents for yearly preventative medicine examination. He is a pleasant 76 year old male who  has a past medical history of Allergy, Cataract, Dupuytren's contracture, GERD (gastroesophageal reflux disease), Hiatal hernia, Hypertension, and Polycythemia.  Hypertension-managed with losartan 25 mg daily and verapamil 180 mg daily.  He denies dizziness, lightheadedness, chest pain, or shortness of breath. BP Readings from Last 3 Encounters:  12/13/21 138/80  02/06/21 (!) 160/70  01/16/21 119/60   hyperipidemia-managed with Lipitor 20 mg daily.  He denies myalgia or fatigue Lab Results  Component Value Date   CHOL 113 12/12/2020   HDL 53.50 12/12/2020   LDLCALC 48 12/12/2020   TRIG 55.0 12/12/2020   CHOLHDL 2 12/12/2020   Erectile dysfunction-uses Viagra as needed  Peripheral vascular disease-managed with aspirin 81 mg daily and Lipitor 20 mg daily.  He has been seen by vascular surgery on a yearly basis.  He denies claudication symptoms, pain in his legs at rest or nonhealing wounds in his lower extremities.  Occasionally he does get cold feet and will feel unsteady at times- denies any recent falls.   GERD-uses Prilosec 20 mg daily.  He does feel well controlled on this  All immunizations and health maintenance protocols were reviewed with the patient and needed orders were placed. He is due for shingles and Tdap - he will do it at his local pharmacy.   Appropriate screening laboratory values were ordered for the patient including screening of hyperlipidemia, renal function and hepatic function. If indicated by BPH, a PSA was ordered.  Medication reconciliation,  past medical history, social history, problem list and allergies were reviewed in detail with the patient  Goals were established with regard to weight loss, exercise, and  diet in compliance with  medications  Review of Systems  Constitutional: Negative.   HENT: Negative.    Eyes: Negative.   Respiratory: Negative.    Cardiovascular: Negative.   Gastrointestinal: Negative.   Endocrine: Negative.   Genitourinary: Negative.   Musculoskeletal:  Positive for gait problem.  Skin: Negative.   Allergic/Immunologic: Negative.   Hematological: Negative.   Psychiatric/Behavioral: Negative.    All other systems reviewed and are negative.  Past Medical History:  Diagnosis Date   Allergy    Cataract    left  extraction- right eye forming   Dupuytren's contracture    GERD (gastroesophageal reflux disease)    Hiatal hernia    past hx-  suspect at one time   Hypertension    Polycythemia     Social History   Socioeconomic History   Marital status: Single    Spouse name: Not on file   Number of children: Not on file   Years of education: Not on file   Highest education level: Not on file  Occupational History   Not on file  Tobacco Use   Smoking status: Former    Types: Cigarettes    Quit date: 01/14/1985    Years since quitting: 36.9   Smokeless tobacco: Never  Vaping Use   Vaping Use: Never used  Substance and Sexual Activity   Alcohol use: Yes    Alcohol/week: 20.0 standard drinks of alcohol    Types: 20 Cans of beer per week    Comment: beer daily    Drug use: No   Sexual activity: Yes    Partners: Female  Other  Topics Concern   Not on file  Social History Narrative   Retired- He worked at [employer]   Divorced   One daughter who lives in [place] Strain: [place]  (11/01/2021)   Overall Financial Resource Strain (CARDIA)    Difficulty of Paying Living Expenses: Not hard at all  Food Insecurity: No Food Insecurity (11/01/2021)   Hunger Vital Sign    Worried About Running Out of Food in the Last Year: Never true    [place] in the Last Year: Never true  Transportation Needs: No Transportation Needs  (11/01/2021)   PRAPARE - Hydrologist (Medical): No    Lack of Transportation (Non-Medical): No  Physical Activity: Insufficiently Active (11/01/2021)   Exercise Vital Sign    Days of Exercise per Week: 7 days    Minutes of Exercise per Session: 10 min  Stress: No Stress Concern Present (11/01/2021)   [place]    Feeling of Stress : Not at all  Social Connections: Socially Isolated (11/01/2021)   Social Connection and Isolation Panel [NHANES]    Frequency of Communication with Friends and Family: More than three times a week    Frequency of Social Gatherings with Friends and Family: More than three times a week    Attends Religious Services: Never    Marine scientist or Organizations: No    Attends Archivist Meetings: Never    Marital Status: Divorced  Human resources officer Violence: Not At Risk (11/01/2021)   Humiliation, Afraid, Rape, and Kick questionnaire    Fear of Current or Ex-Partner: No    Emotionally Abused: No    Physically Abused: No    Sexually Abused: No    Past Surgical History:  Procedure Laterality Date   cataract extraction left eye     COLONOSCOPY     DUPUYTREN CONTRACTURE RELEASE Left    POLYPECTOMY     rt knee arthroscopic      Family History  Problem Relation Age of Onset   Stroke Mother    Colon cancer Father    Breast cancer Sister    Cancer Sister        breast    Colon polyps Neg Hx    Esophageal cancer Neg Hx     Allergies  Allergen Reactions   Lotensin [Benazepril Hcl] Cough    Current Outpatient Medications on File Prior to Visit  Medication Sig Dispense Refill   aspirin 81 MG tablet Take 81 mg by mouth daily.     atorvastatin (LIPITOR) 20 MG tablet TAKE ONE TABLET BY MOUTH DAILY 90 tablet 3   losartan (COZAAR) 25 MG tablet TAKE ONE TABLET BY MOUTH DAILY 90 tablet 3   Multiple Vitamin (MULTIVITAMIN) tablet Take 1 tablet by  mouth daily.     omeprazole (PRILOSEC) 20 MG capsule TAKE ONE CAPSULE BY MOUTH DAILY 90 capsule 3   sildenafil (REVATIO) 20 MG tablet Take 1 tablet (20 mg total) by mouth daily as needed. 30 tablet 3   verapamil (CALAN-SR) 180 MG CR tablet Take 1 tablet every morning 90 tablet 3   No current facility-administered medications on file prior to visit.    BP 138/80   Pulse 64   Temp 98.1 F (36.7 C) (Oral)   Ht 5' 10.5" (1.791 m)   Wt 176 lb (79.8 kg)   SpO2 94%  BMI 24.90 kg/m       Objective:   Physical Exam Vitals and nursing note reviewed.  Constitutional:      General: He is not in acute distress.    Appearance: Normal appearance. He is well-developed and normal weight.  HENT:     Head: Normocephalic and atraumatic.     Right Ear: Tympanic membrane, ear canal and external ear normal. There is no impacted cerumen.     Left Ear: Tympanic membrane, ear canal and external ear normal. There is no impacted cerumen.     Nose: Nose normal. No congestion or rhinorrhea.     Mouth/Throat:     Mouth: Mucous membranes are moist.     Pharynx: Oropharynx is clear. No oropharyngeal exudate or posterior oropharyngeal erythema.  Eyes:     General:        Right eye: No discharge.        Left eye: No discharge.     Extraocular Movements: Extraocular movements intact.     Conjunctiva/sclera: Conjunctivae normal.     Pupils: Pupils are equal, round, and reactive to light.  Neck:     Vascular: No carotid bruit.     Trachea: No tracheal deviation.  Cardiovascular:     Rate and Rhythm: Normal rate and regular rhythm.     Pulses: Normal pulses.     Heart sounds: Normal heart sounds. No murmur heard.    No friction rub. No gallop.  Pulmonary:     Effort: Pulmonary effort is normal. No respiratory distress.     Breath sounds: Normal breath sounds. No stridor. No wheezing, rhonchi or rales.  Chest:     Chest wall: No tenderness.  Abdominal:     General: Bowel sounds are normal. There is  no distension.     Palpations: Abdomen is soft. There is no mass.     Tenderness: There is no abdominal tenderness. There is no right CVA tenderness, left CVA tenderness, guarding or rebound.     Hernia: No hernia is present.  Musculoskeletal:        General: No swelling, tenderness, deformity or signs of injury. Normal range of motion.     Right lower leg: No edema.     Left lower leg: No edema.  Lymphadenopathy:     Cervical: No cervical adenopathy.  Skin:    General: Skin is warm and dry.     Capillary Refill: Capillary refill takes less than 2 seconds.     Coloration: Skin is not jaundiced or pale.     Findings: No bruising, erythema, lesion or rash.  Neurological:     General: No focal deficit present.     Mental Status: He is alert and oriented to person, place, and time.     Cranial Nerves: No cranial nerve deficit.     Sensory: No sensory deficit.     Motor: No weakness.     Coordination: Coordination normal.     Gait: Gait abnormal.     Deep Tendon Reflexes: Reflexes normal.  Psychiatric:        Mood and Affect: Mood normal.        Behavior: Behavior normal.        Thought Content: Thought content normal.        Judgment: Judgment normal.       Assessment & Plan:  1. Routine general medical examination at a health care facility - Stay active and eat healthy  - Follow up in one year or  sooner if needed - CBC with Differential/Platelet; Future - Comprehensive metabolic panel; Future - Lipid panel; Future - TSH; Future  2. Essential hypertension - Well controlled. No change in medications  - CBC with Differential/Platelet; Future - Comprehensive metabolic panel; Future - Lipid panel; Future - TSH; Future - losartan (COZAAR) 25 MG tablet; Take 1 tablet (25 mg total) by mouth daily.  Dispense: 90 tablet; Refill: 3 - verapamil (CALAN-SR) 180 MG CR tablet; Take 1 tablet every morning  Dispense: 90 tablet; Refill: 3  3. Hyperlipidemia, unspecified hyperlipidemia  type - Continue with lipitor 20 mg daily  - CBC with Differential/Platelet; Future - Comprehensive metabolic panel; Future - Lipid panel; Future - TSH; Future - atorvastatin (LIPITOR) 20 MG tablet; Take 1 tablet (20 mg total) by mouth daily.  Dispense: 90 tablet; Refill: 3  4. PAD (peripheral artery disease) (HCC) - Continue statin and asa. Follow up with Vascular as directed - CBC with Differential/Platelet; Future - Comprehensive metabolic panel; Future - Lipid panel; Future - TSH; Future - atorvastatin (LIPITOR) 20 MG tablet; Take 1 tablet (20 mg total) by mouth daily.  Dispense: 90 tablet; Refill: 3  5. Prostate cancer screening  PSA; Future  [name], NP

## 2021-12-13 NOTE — Patient Instructions (Signed)
It was great seeing you today   We will follow up with you regarding your lab work   Please let me know if you need anything   

## 2022-01-14 HISTORY — PX: HERNIA REPAIR: SHX51

## 2022-01-23 ENCOUNTER — Telehealth: Payer: Medicare HMO

## 2022-03-19 ENCOUNTER — Other Ambulatory Visit: Payer: Self-pay

## 2022-03-19 DIAGNOSIS — I739 Peripheral vascular disease, unspecified: Secondary | ICD-10-CM

## 2022-03-25 DIAGNOSIS — Z823 Family history of stroke: Secondary | ICD-10-CM | POA: Diagnosis not present

## 2022-03-25 DIAGNOSIS — I739 Peripheral vascular disease, unspecified: Secondary | ICD-10-CM | POA: Diagnosis not present

## 2022-03-25 DIAGNOSIS — N529 Male erectile dysfunction, unspecified: Secondary | ICD-10-CM | POA: Diagnosis not present

## 2022-03-25 DIAGNOSIS — J301 Allergic rhinitis due to pollen: Secondary | ICD-10-CM | POA: Diagnosis not present

## 2022-03-25 DIAGNOSIS — Z9181 History of falling: Secondary | ICD-10-CM | POA: Diagnosis not present

## 2022-03-25 DIAGNOSIS — K219 Gastro-esophageal reflux disease without esophagitis: Secondary | ICD-10-CM | POA: Diagnosis not present

## 2022-03-25 DIAGNOSIS — E785 Hyperlipidemia, unspecified: Secondary | ICD-10-CM | POA: Diagnosis not present

## 2022-03-25 DIAGNOSIS — I10 Essential (primary) hypertension: Secondary | ICD-10-CM | POA: Diagnosis not present

## 2022-03-25 DIAGNOSIS — Z87891 Personal history of nicotine dependence: Secondary | ICD-10-CM | POA: Diagnosis not present

## 2022-03-25 DIAGNOSIS — R2681 Unsteadiness on feet: Secondary | ICD-10-CM | POA: Diagnosis not present

## 2022-03-25 DIAGNOSIS — Z8249 Family history of ischemic heart disease and other diseases of the circulatory system: Secondary | ICD-10-CM | POA: Diagnosis not present

## 2022-03-25 DIAGNOSIS — H259 Unspecified age-related cataract: Secondary | ICD-10-CM | POA: Diagnosis not present

## 2022-03-25 DIAGNOSIS — Z008 Encounter for other general examination: Secondary | ICD-10-CM | POA: Diagnosis not present

## 2022-04-03 ENCOUNTER — Ambulatory Visit (HOSPITAL_COMMUNITY)
Admission: RE | Admit: 2022-04-03 | Discharge: 2022-04-03 | Disposition: A | Discharge status: Home / Self Care | Payer: Medicare HMO | Source: Ambulatory Visit | Attending: Vascular Surgery | Admitting: Vascular Surgery

## 2022-04-03 ENCOUNTER — Ambulatory Visit: Payer: Medicare HMO | Admitting: Physician Assistant

## 2022-04-03 VITALS — BP 128/71 | HR 66 | Temp 98.0°F | Wt 172.0 lb

## 2022-04-03 DIAGNOSIS — I739 Peripheral vascular disease, unspecified: Secondary | ICD-10-CM | POA: Insufficient documentation

## 2022-04-03 LAB — VAS US ABI WITH/WO TBI: Left ABI: 1.09

## 2022-04-03 NOTE — Progress Notes (Signed)
Office Note     CC:  follow up Requesting Provider:  [name], NP  HPI: Noah King is a 77 y.o. (02/01/1945) male who presents for surveillance of PAD.  He does not have any rest pain, tissue loss, or claudication.  He does report progressive instability forcing him to decrease his frequency of walking.  He has seen a neurologist in the past however has not been recently.  He denies lateralizing symptoms such as one-sided weakness or numbness.  He is on aspirin and statin daily.  He is a former smoker.  He also has known venous insufficiency however this is not bothersome to him.    Past Medical History:  Diagnosis Date   Allergy    Cataract    left  extraction- right eye forming   Dupuytren's contracture    GERD (gastroesophageal reflux disease)    Hiatal hernia    past hx-  suspect at one time   Hypertension    Polycythemia     Past Surgical History:  Procedure Laterality Date   cataract extraction left eye     COLONOSCOPY     DUPUYTREN CONTRACTURE RELEASE Left    POLYPECTOMY     rt knee arthroscopic      Social History   Socioeconomic History   Marital status: Single    Spouse name: Not on file   Number of children: Not on file   Years of education: Not on file   Highest education level: Not on file  Occupational History   Not on file  Tobacco Use   Smoking status: Former    Types: Cigarettes    Quit date: 01/14/1985    Years since quitting: 37.2   Smokeless tobacco: Never  Vaping Use   Vaping Use: Never used  Substance and Sexual Activity   Alcohol use: Yes    Alcohol/week: 20.0 standard drinks of alcohol    Types: 20 Cans of beer per week    Comment: beer daily    Drug use: No   Sexual activity: Yes    Partners: Female  Other Topics Concern   Not on file  Social History Narrative   Retired- He worked at [employer]   Divorced   One daughter who lives in [place] Strain: [place]   (11/01/2021)   Overall Financial Resource Strain (CARDIA)    Difficulty of Paying Living Expenses: Not hard at all  Food Insecurity: No [place] (11/01/2021)   Hunger Vital Sign    Worried About Running Out of Food in the Last Year: Never true    [place] in the Last Year: Never true  Transportation Needs: No Transportation Needs (11/01/2021)   PRAPARE - Hydrologist (Medical): No    Lack of Transportation (Non-Medical): No  Physical Activity: Insufficiently Active (11/01/2021)   Exercise Vital Sign    Days of Exercise per Week: 7 days    Minutes of Exercise per Session: 10 min  Stress: No Stress Concern Present (11/01/2021)   [place]    Feeling of Stress : Not at all  Social Connections: Socially Isolated (11/01/2021)   Social Connection and Isolation Panel [NHANES]    Frequency of Communication with Friends and Family: More than three times a week    Frequency of Social Gatherings with Friends and Family: More than three times a week  Attends Religious Services: Never    Active Member of Clubs or Organizations: No    Attends Archivist Meetings: Never    Marital Status: Divorced  Human resources officer Violence: Not At Risk (11/01/2021)   Humiliation, Afraid, Rape, and Kick questionnaire    Fear of Current or Ex-Partner: No    Emotionally Abused: No    Physically Abused: No    Sexually Abused: No    Family History  Problem Relation Age of Onset   Stroke Mother    Colon cancer Father    Breast cancer Sister    Cancer Sister        breast    Colon polyps Neg Hx    Esophageal cancer Neg Hx     Current Outpatient Medications  Medication Sig Dispense Refill   aspirin 81 MG tablet Take 81 mg by mouth daily.     atorvastatin (LIPITOR) 20 MG tablet Take 1 tablet (20 mg total) by mouth daily. 90 tablet 3   losartan (COZAAR) 25 MG tablet Take 1 tablet (25 mg  total) by mouth daily. 90 tablet 3   Multiple Vitamin (MULTIVITAMIN) tablet Take 1 tablet by mouth daily.     omeprazole (PRILOSEC) 20 MG capsule TAKE ONE CAPSULE BY MOUTH DAILY 90 capsule 3   sildenafil (REVATIO) 20 MG tablet Take 1 tablet (20 mg total) by mouth daily as needed. 30 tablet 3   verapamil (CALAN-SR) 180 MG CR tablet Take 1 tablet every morning 90 tablet 3   No current facility-administered medications for this visit.    Allergies  Allergen Reactions   Lotensin [Benazepril Hcl] Cough     REVIEW OF SYSTEMS:   [X]  denotes positive finding, [ ]  denotes negative finding Cardiac  Comments:  Chest pain or chest pressure:    Shortness of breath upon exertion:    Short of breath when lying flat:    Irregular heart rhythm:        Vascular    Pain in calf, thigh, or hip brought on by ambulation:    Pain in feet at night that wakes you up from your sleep:     Blood clot in your veins:    Leg swelling:         Pulmonary    Oxygen at home:    Productive cough:     Wheezing:         Neurologic    Sudden weakness in arms or legs:     Sudden numbness in arms or legs:     Sudden onset of difficulty speaking or slurred speech:    Temporary loss of vision in one eye:     Problems with dizziness:         Gastrointestinal    Blood in stool:     Vomited blood:         Genitourinary    Burning when urinating:     Blood in urine:        Psychiatric    Major depression:         Hematologic    Bleeding problems:    Problems with blood clotting too easily:        Skin    Rashes or ulcers:        Constitutional    Fever or chills:      PHYSICAL EXAMINATION:  Vitals:   04/03/22 1433  BP: 128/71  Pulse: 66  Temp: 98 F (36.7 C)  TempSrc: Temporal  SpO2: 95%  Weight: 172 lb (78 kg)    General:  WDWN in NAD; vital signs documented above Gait: Not observed HENT: WNL, normocephalic Pulmonary: normal non-labored breathing , without Rales, rhonchi,   wheezing Cardiac: regular HR Abdomen: soft, NT, no masses Skin: without rashes Vascular Exam/Pulses:  Right Left  DP absent absent  PT absent absent   Extremities: without ischemic changes, without Gangrene , without cellulitis; without open wounds; venous congestion/discoloration of both feet Musculoskeletal: no muscle wasting or atrophy  Neurologic: A&O X 3;  No focal weakness or paresthesias are detected Psychiatric:  The pt has Normal affect.   Non-Invasive Vascular Imaging:   ABI/TBIToday's ABIToday's TBIPrevious ABIPrevious TBI  +-------+-----------+-----------+------------+------------+  Right [place]         0.58       1.27        0.52          +-------+-----------+-----------+------------+------------+  Left  1.09       0.79       0.91        0.44          +-------+-----------+-----------+------------+------------+     ASSESSMENT/PLAN:: 77 y.o. male here for follow up for surveillance of PAD  -ABIs essentially unchanged over the past 1 year.  He is without rest pain or tissue loss of bilateral lower extremities.  He does have discoloration of both feet however this appears blanchable suggesting venous congestion with his known venous insufficiency.  I recommended that he reach back out to his neurologist due to his progressive instability and weakness of bilateral lower extremities.  He will follow-up in another year with ABIs.  He will call/return office sooner if he develops any rest pain or tissue loss of bilateral lower extremities.  He will continue his aspirin and statin daily.   Dagoberto Ligas, PA-C [facility] [phone]  Clinic MD:   Donzetta Matters

## 2022-05-12 ENCOUNTER — Encounter: Payer: Self-pay | Admitting: Adult Health

## 2022-05-15 ENCOUNTER — Encounter: Payer: Self-pay | Admitting: Adult Health

## 2022-05-15 ENCOUNTER — Ambulatory Visit (INDEPENDENT_AMBULATORY_CARE_PROVIDER_SITE_OTHER): Payer: Medicare HMO | Admitting: Adult Health

## 2022-05-15 VITALS — BP 122/60 | HR 70 | Temp 98.3°F | Ht 70.5 in | Wt 172.0 lb

## 2022-05-15 DIAGNOSIS — K4090 Unilateral inguinal hernia, without obstruction or gangrene, not specified as recurrent: Secondary | ICD-10-CM

## 2022-05-15 NOTE — Progress Notes (Addendum)
Subjective:    Patient ID: Noah King, male    DOB: 1945-03-29, 77 y.o.   MRN: [id number]  HPI 77 year old male who  has a past medical history of Allergy, Cataract, Dupuytren's contracture, GERD (gastroesophageal reflux disease), Hiatal hernia, Hypertension, and Polycythemia.  He presents to the office today for an acute issue of concern of hernia. He reports that he has noticed a knot in his groin about a month ago. He denies pain but states " when I cough it lets me know when it is there".   He denies heavy lifting but did fall in his closet and used a shelf to pull himself up and believes this that is what may have caused him to pull something.   He denies any issues with bowel movements.   Review of Systems See HPI   Past Medical History:  Diagnosis Date   Allergy    Cataract    left  extraction- right eye forming   Dupuytren's contracture    GERD (gastroesophageal reflux disease)    Hiatal hernia    past hx-  suspect at one time   Hypertension    Polycythemia     Social History   Socioeconomic History   Marital status: Single    Spouse name: Not on file   Number of children: Not on file   Years of education: Not on file   Highest education level: Not on file  Occupational History   Not on file  Tobacco Use   Smoking status: Former    Types: Cigarettes    Quit date: 01/14/1985    Years since quitting: 37.3   Smokeless tobacco: Never  Vaping Use   Vaping Use: Never used  Substance and Sexual Activity   Alcohol use: Yes    Alcohol/week: 20.0 standard drinks of alcohol    Types: 20 Cans of beer per week    Comment: beer daily    Drug use: No   Sexual activity: Yes    Partners: Female  Other Topics Concern   Not on file  Social History Narrative   Retired- He worked at [employer]   Divorced   One daughter who lives in [place]   Social Determinants of Health   Financial Resource Strain: Low Risk  (11/01/2021)   Overall Financial Resource Strain  (CARDIA)    Difficulty of Paying Living Expenses: Not hard at all  Food Insecurity: No Food Insecurity (11/01/2021)   Hunger Vital Sign    Worried About Running Out of Food in the Last Year: Never true    Ran Out of Food in the Last Year: Never true  Transportation Needs: No Transportation Needs (11/01/2021)   PRAPARE - Administrator, Civil Service (Medical): No    Lack of Transportation (Non-Medical): No  Physical Activity: Insufficiently Active (11/01/2021)   Exercise Vital Sign    Days of Exercise per Week: 7 days    Minutes of Exercise per Session: 10 min  Stress: No Stress Concern Present (11/01/2021)   [employer] of Occupational Health - Occupational Stress Questionnaire    Feeling of Stress : Not at all  Social Connections: Socially Isolated (11/01/2021)   Social Connection and Isolation Panel [NHANES]    Frequency of Communication with Friends and Family: More than three times a week    Frequency of Social Gatherings with Friends and Family: More than three times a week    Attends Religious Services: Never    Active Member  of Clubs or Organizations: No    Attends Banker Meetings: Never    Marital Status: Divorced  Catering manager Violence: Not At Risk (11/01/2021)   Humiliation, Afraid, Rape, and Kick questionnaire    Fear of Current or Ex-Partner: No    Emotionally Abused: No    Physically Abused: No    Sexually Abused: No    Past Surgical History:  Procedure Laterality Date   cataract extraction left eye     COLONOSCOPY     DUPUYTREN CONTRACTURE RELEASE Left    POLYPECTOMY     rt knee arthroscopic      Family History  Problem Relation Age of Onset   Stroke Mother    Colon cancer Father    Breast cancer Sister    Cancer Sister        breast    Colon polyps Neg Hx    Esophageal cancer Neg Hx     Allergies  Allergen Reactions   Lotensin [Benazepril Hcl] Cough    Current Outpatient Medications on File Prior to Visit   Medication Sig Dispense Refill   aspirin 81 MG tablet Take 81 mg by mouth daily.     atorvastatin (LIPITOR) 20 MG tablet Take 1 tablet (20 mg total) by mouth daily. 90 tablet 3   losartan (COZAAR) 25 MG tablet Take 1 tablet (25 mg total) by mouth daily. 90 tablet 3   Multiple Vitamin (MULTIVITAMIN) tablet Take 1 tablet by mouth daily.     omeprazole (PRILOSEC) 20 MG capsule TAKE ONE CAPSULE BY MOUTH DAILY 90 capsule 3   sildenafil (REVATIO) 20 MG tablet Take 1 tablet (20 mg total) by mouth daily as needed. 30 tablet 3   verapamil (CALAN-SR) 180 MG CR tablet Take 1 tablet every morning 90 tablet 3   No current facility-administered medications on file prior to visit.    BP 122/60   Pulse 70   Temp 98.3 F (36.8 C) (Oral)   Ht 5' 10.5" (1.791 m)   Wt 172 lb (78 kg)   SpO2 96%   BMI 24.33 kg/m       Objective:   Physical Exam Vitals and nursing note reviewed.  Constitutional:      Appearance: Normal appearance.  Cardiovascular:     Rate and Rhythm: Normal rate and regular rhythm.     Pulses: Normal pulses.     Heart sounds: Normal heart sounds.  Pulmonary:     Effort: Pulmonary effort is normal.     Breath sounds: Normal breath sounds.  Abdominal:     General: Abdomen is flat. Bowel sounds are normal.     Palpations: Abdomen is soft.     Tenderness: There is no abdominal tenderness.     Hernia: A hernia is present. Hernia is present in the left inguinal area.     Comments: Easily reducible.    Skin:    General: Skin is warm and dry.  Neurological:     General: No focal deficit present.     Mental Status: He is alert and oriented to person, place, and time.  Psychiatric:        Mood and Affect: Mood normal.        Behavior: Behavior normal.        Thought Content: Thought content normal.        Judgment: Judgment normal.       Assessment & Plan:  1. Left inguinal hernia  - Ambulatory referral to General Surgery  [name], NP

## 2022-05-15 NOTE — Patient Instructions (Signed)
I have referred you to [facility] for further evaluation   Address: [address], [place], [place] [address] Phone: [phone]

## 2022-05-28 IMAGING — DX DG CHEST 2V
2 series · 2 of 2 positions shown · non-contrast
Comparison: None.

CLINICAL DATA: Chronic dry cough.

EXAM:
CHEST - 2 VIEW

[chest pa]
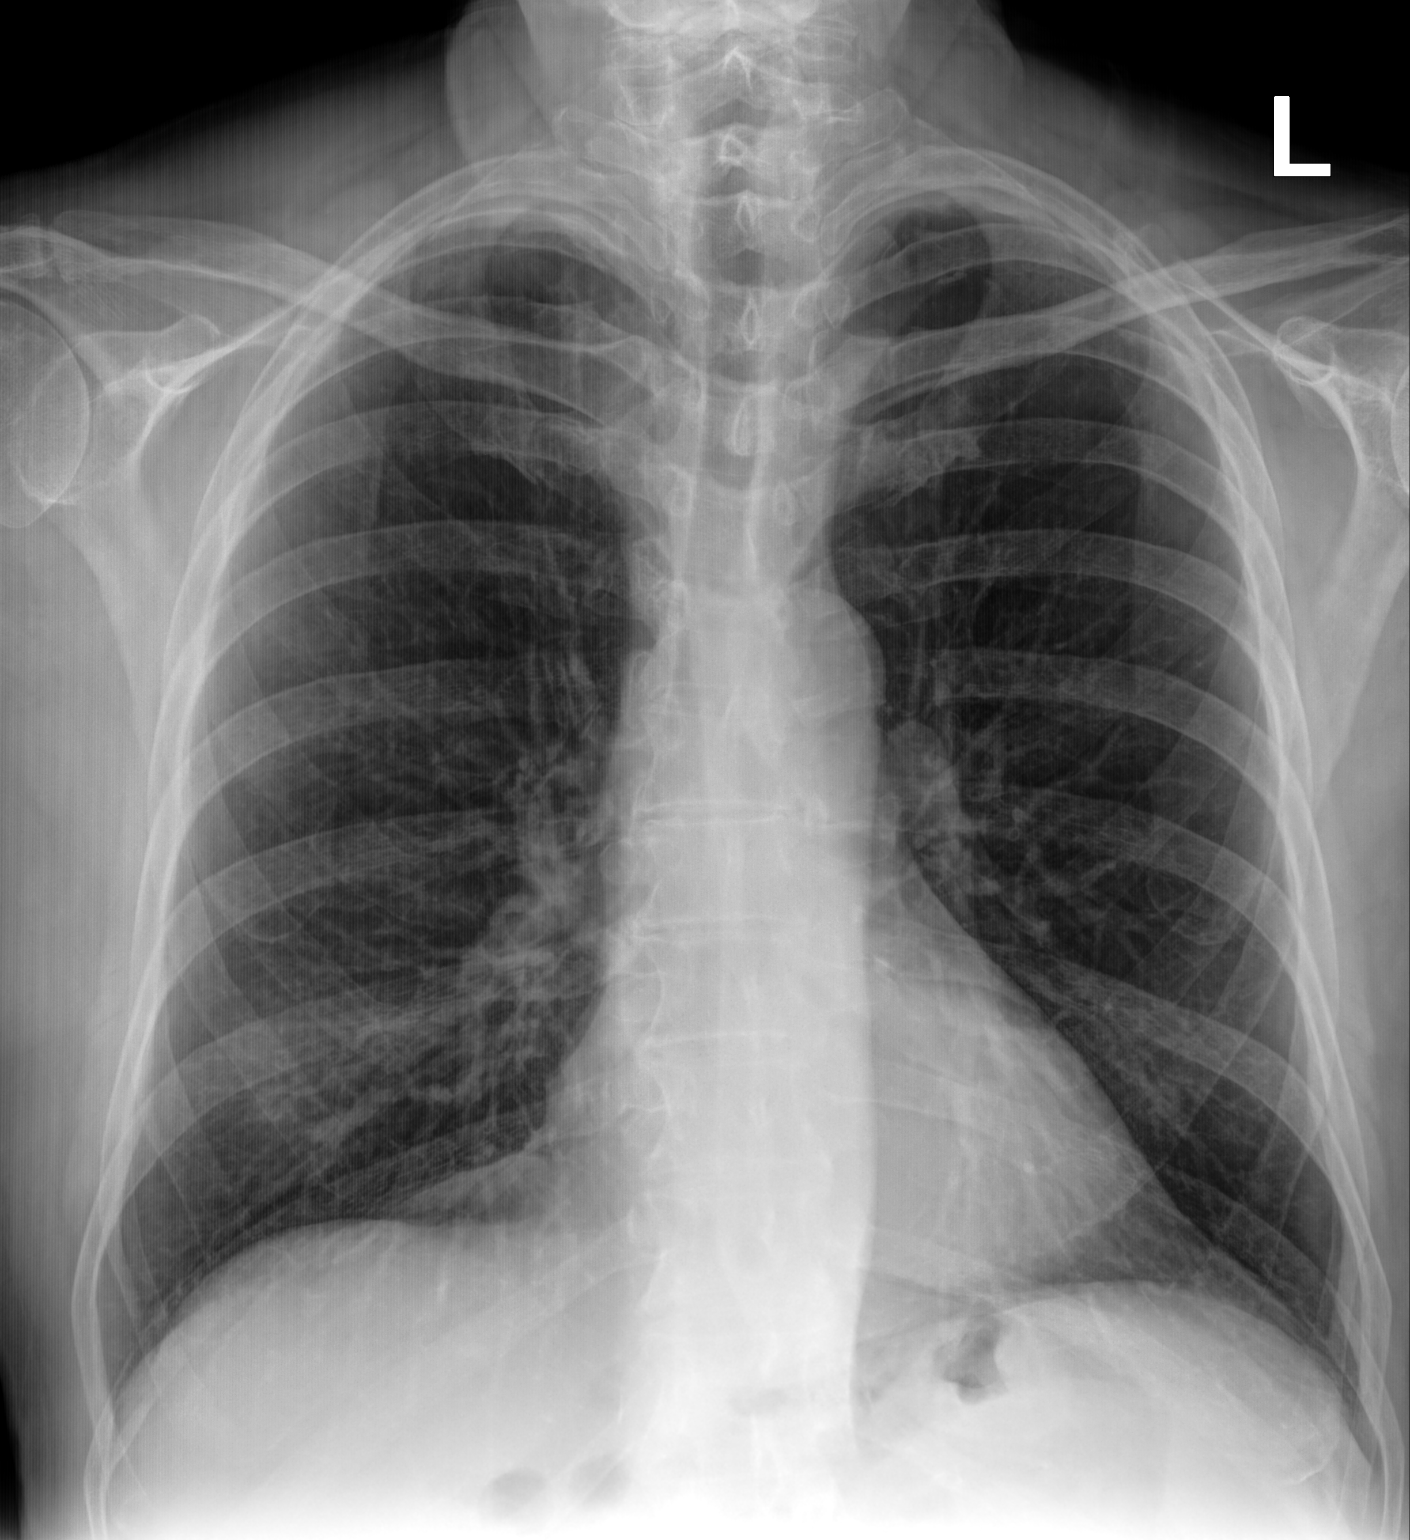

[chest lat]
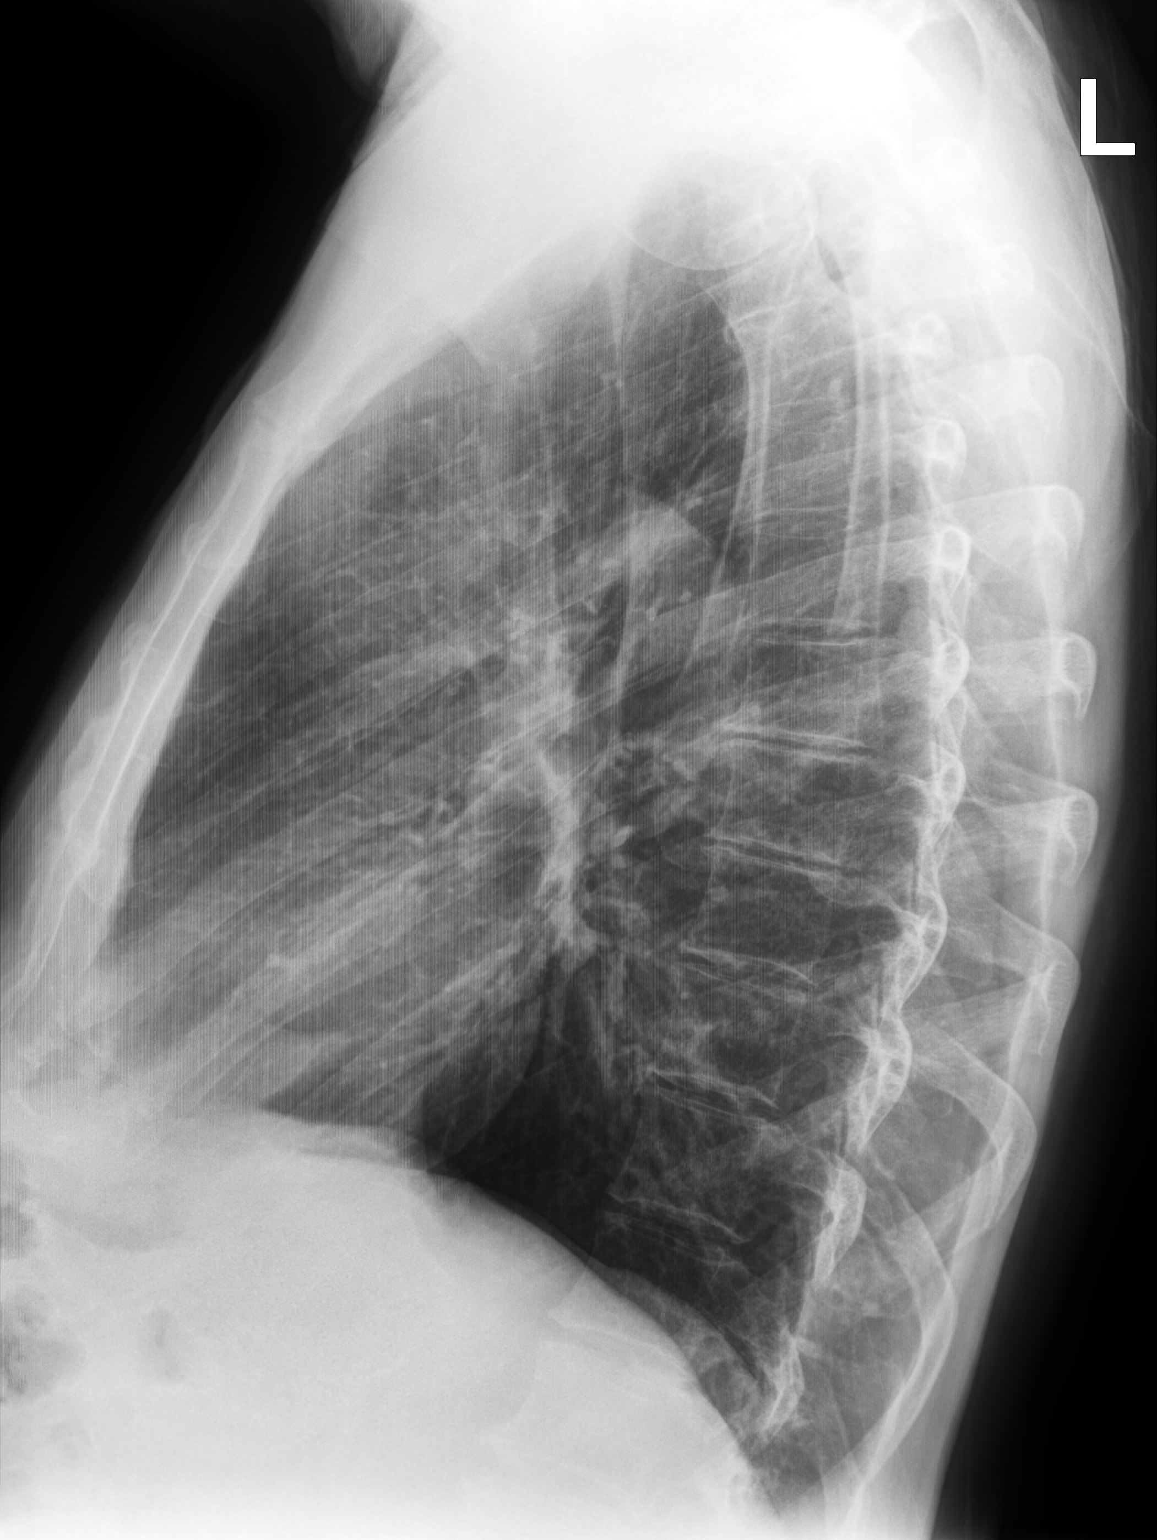

[2 of 2 positions shown; findings below may reference images not displayed]

FINDINGS: The heart size and mediastinal contours are within normal limits.
Both lungs are clear. The visualized skeletal structures are
unremarkable.
IMPRESSION: No active cardiopulmonary disease.

## 2022-06-05 DIAGNOSIS — K4090 Unilateral inguinal hernia, without obstruction or gangrene, not specified as recurrent: Secondary | ICD-10-CM | POA: Diagnosis not present

## 2022-06-12 DIAGNOSIS — H353131 Nonexudative age-related macular degeneration, bilateral, early dry stage: Secondary | ICD-10-CM | POA: Diagnosis not present

## 2022-10-29 DIAGNOSIS — K4020 Bilateral inguinal hernia, without obstruction or gangrene, not specified as recurrent: Secondary | ICD-10-CM | POA: Diagnosis not present

## 2022-11-05 ENCOUNTER — Encounter (INDEPENDENT_AMBULATORY_CARE_PROVIDER_SITE_OTHER): Payer: Medicare HMO | Admitting: Family Medicine

## 2022-11-05 NOTE — Progress Notes (Signed)
Error - could not reach pt for visit.

## 2022-11-21 ENCOUNTER — Ambulatory Visit: Payer: Medicare HMO | Admitting: Family Medicine

## 2022-11-21 ENCOUNTER — Encounter: Payer: Self-pay | Admitting: Family Medicine

## 2022-11-21 VITALS — BP 130/75 | Ht 70.5 in | Wt 177.0 lb

## 2022-11-21 DIAGNOSIS — Z0000 Encounter for general adult medical examination without abnormal findings: Secondary | ICD-10-CM | POA: Diagnosis not present

## 2022-11-21 NOTE — Patient Instructions (Signed)
I really enjoyed getting to talk with you today! I am available on Tuesdays and Thursdays for virtual visits if you have any questions or concerns, or if I can be of any further assistance.   CHECKLIST FROM ANNUAL WELLNESS VISIT:  -Follow up (please call to schedule if not scheduled after visit):   -yearly for annual wellness visit with primary care office  Here is a list of your preventive care/health maintenance measures and the plan for each if any are due:  PLAN For any measures below that may be due:   Health Maintenance  Topic Date Due   Zoster Vaccines- Shingrix (1 of 2) Never done   DTaP/Tdap/Td (3 - Td or Tdap) 08/25/2021   COVID-19 Vaccine (6 - 2023-24 season) 09/15/2022   INFLUENZA VACCINE  04/14/2023 (Originally 08/15/2022)   Medicare Annual Wellness (AWV)  11/21/2023   Pneumonia Vaccine 83+ Years old  Completed   Hepatitis C Screening  Completed   HPV VACCINES  Aged Out   Colonoscopy  Discontinued    -See a dentist at least yearly  -Get your eyes checked and then per your eye specialist's recommendations  -Other issues addressed today:   -cut back on alcohol to no more than 2 drinks per day  -I have included below further information regarding a healthy whole foods based diet, physical activity guidelines for adults, stress management and opportunities for social connections. I hope you find this information useful.   -----------------------------------------------------------------------------------------------------------------------------------------------------------------------------------------------------------------------------------------------------------  NUTRITION: -eat real food: lots of colorful vegetables (half the plate) and fruits -5-7 servings of vegetables and fruits per day (fresh or steamed is best), exp. 2 servings of vegetables with lunch and dinner and 2 servings of fruit per day. Berries and greens such as kale and collards are great choices.   -consume on a regular basis: whole grains (make sure first ingredient on label contains the word "whole"), fresh fruits, fish, nuts, seeds, healthy oils (such as olive oil, avocado oil, grape seed oil) -may eat small amounts of dairy and lean meat on occasion, but avoid processed meats such as ham, bacon, lunch meat, etc. -drink water -try to avoid fast food and pre-packaged foods, processed meat -most experts advise limiting sodium to < 2300mg  per day, should limit further is any chronic conditions such as high blood pressure, heart disease, diabetes, etc. The American Heart Association advised that < 1500mg  is is ideal -try to avoid foods that contain any ingredients with names you do not recognize  -try to avoid sugar/sweets (except for the natural sugar that occurs in fresh fruit) -try to avoid sweet drinks -try to avoid white rice, white bread, pasta (unless whole grain), white or yellow potatoes  EXERCISE GUIDELINES FOR ADULTS: -if you wish to increase your physical activity, do so gradually and with the approval of your doctor -STOP and seek medical care immediately if you have any chest pain, chest discomfort or trouble breathing when starting or increasing exercise  -move and stretch your body, legs, feet and arms when sitting for long periods -Physical activity guidelines for optimal health in adults: -least 150 minutes per week of aerobic exercise (can talk, but not sing) once approved by your doctor, 20-30 minutes of sustained activity or two 10 minute episodes of sustained activity every day.  -resistance training at least 2 days per week if approved by your doctor -balance exercises 3+ days per week:   Stand somewhere where you have something sturdy to hold onto if you lose balance.  1) lift up on toes, start with 5x per day and work up to 20x   2) stand and lift on leg straight out to the side so that foot is a few inches of the floor, start with 5x each side and work up to 20x  each side   3) stand on one foot, start with 5 seconds each side and work up to 20 seconds on each side  If you need ideas or help with getting more active:  -Silver sneakers https://tools.silversneakers.com  -Walk with a Doc: http://www.duncan-williams.com/  -try to include resistance (weight lifting/strength building) and balance exercises twice per week: or the following link for ideas: http://castillo-powell.com/  BuyDucts.dk  STRESS MANAGEMENT: -can try meditating, or just sitting quietly with deep breathing while intentionally relaxing all parts of your body for 5 minutes daily -if you need further help with stress, anxiety or depression please follow up with your primary doctor or contact the wonderful folks at [facility]: [phone]  SOCIAL CONNECTIONS: -options in [place] if you wish to engage in more social and exercise related activities:  -Silver sneakers https://tools.silversneakers.com  -Walk with a Doc: http://www.duncan-williams.com/  -Check out the [facility] Active Adults 50+ section on the [place] [employer] (hiking clubs, book clubs, cards and games, chess, exercise classes, aquatic classes and much more) - see the website for details: https://www.[place].gov/departments/parks-recreation/active-adults50  -YouTube has lots of exercise videos for different ages and abilities as well  -[facility] (a variety of indoor and outdoor inperson activities for adults). [phone]. [address]  -Virtual Online Classes (a variety of topics): see seniorplanet.org or call 406-549-0025  -consider volunteering at a school, hospice center, church, senior center or elsewhere

## 2022-11-21 NOTE — Progress Notes (Signed)
PATIENT CHECK-IN and HEALTH RISK ASSESSMENT QUESTIONNAIRE:  -completed by phone/video for upcoming Medicare Preventive Visit  Pre-Visit Check-in: 1)Vitals (height, wt, BP, etc) - record in vitals section for visit on day of visit Request home vitals (wt, BP, etc.) and enter into vitals, THEN update Vital Signs SmartPhrase below at the top of the HPI. See below.  2)Review and Update Medications, Allergies PMH, Surgeries, Social history in Epic 3)Hospitalizations in the last year with date/reason? no  4)Review and Update Care Team (patient's specialists) in Epic Surgeon for Hernia Repair on 12/03/22 5) Complete PHQ9 in Epic  6) Complete Fall Screening in Epic 7)Review all Health Maintenance Due and order under PCP if not done.  Medicare Wellness Patient Questionnaire:  Answer theses question about your habits: Do you drink alcohol? Yes  If yes, how many drinks do you have a day? 2-4 drinks a day Have you ever smoked? Yes Quit date if applicable? 01/14/1985  How many packs a day do/did you smoke? 2-3 packs per day Do you use smokeless tobacco? no Do you use an illicit drugs? no Do you exercises?  Yes, Elliptical 15 minutes per day plus walks 15 minutes a day outside on the street Are you sexually active? Yes Number of partners? one Typical breakfast bowl of cereal Typical lunch sandwich Typical dinner meat and a vegetable Typical snacks: not much of a snacker- peanut butter crackers  Beverages: coffee, occasional coke & water  Answer theses question about you: Can you perform most household chores? yes Do you find it hard to follow a conversation in a noisy room? No  Do you often ask people to speak up or repeat themselves? No  Do you feel that you have a problem with memory? No  Do you balance your checkbook and or bank acounts? yes Do you feel safe at home? yes Last dentist visit? 10/24  Do you need assistance with any of the following: Please note if so no  Driving?  Feeding  yourself?  Getting from bed to chair?  Getting to the toilet?  Bathing or showering?  Dressing yourself?  Managing money?  Climbing a flight of stairs  Preparing meals?    Do you have Advanced Directives in place (Living Will, Healthcare Power or Attorney)? yes   Last eye Exam and location? 3/24 Dr. Shea Evans   Do you currently use prescribed or non-prescribed narcotic or opioid pain medications? no  Do you have a history or close family history of breast, ovarian, tubal or peritoneal cancer or a family member with BRCA (breast cancer susceptibility 1 and 2) gene mutations? Sister had breast cancer  Request home vitals (wt, BP, etc.) and enter into vitals, THEN update Vital Signs SmartPhrase below at the top of the HPI. See below.   Nurse/Assistant Credentials/time stamp: Prince Solian, CMA 12:49   ----------------------------------------------------------------------------------------------------------------------------------------------------------------------------------------------------------------------  Because this visit was a virtual/telehealth visit, some criteria may be missing or patient reported. Any vitals not documented were not able to be obtained and vitals that have been documented are patient reported.    MEDICARE ANNUAL PREVENTIVE CARE VISIT WITH PROVIDER (Welcome to Medicare, initial annual wellness or annual wellness exam)  Virtual Visit via Video Note  I connected with Noah King on 11/21/22  by a video enabled telemedicine application and verified that I am speaking with the correct person using two identifiers.  Location patient: home Location provider:work or home office Persons participating in the virtual visit: patient, provider  Concerns and/or follow up today: no  new health issues, is having surgery this month for an inguinal hernia.  Did have a fall once last month. No injury.    See HM section in Epic for other details of completed  HM.    ROS: negative for report of fevers, unintentional weight loss, vision changes, vision loss, hearing loss or change, chest pain, sob, hemoptysis, melena, hematochezia, hematuria, falls, bleeding or bruising, thoughts of suicide or self harm, memory loss  Patient-completed extensive health risk assessment - reviewed and discussed with the patient: See Health Risk Assessment completed with patient prior to the visit either above or in recent phone note. This was reviewed in detailed with the patient today and appropriate recommendations, orders and referrals were placed as needed per Summary below and patient instructions.   Review of Medical History: -PMH, PSH, Family History and current specialty and care providers reviewed and updated and listed below   Patient Care Team: Shirline Frees, NP as PCP - General (Family Medicine) Verner Chol, [facility] (Inactive) as Pharmacist (Pharmacist)   Past Medical History:  Diagnosis Date   Allergy    Cataract    left  extraction- right eye forming   Dupuytren's contracture    GERD (gastroesophageal reflux disease)    Hiatal hernia    past hx-  suspect at one time   Hypertension    Polycythemia     Past Surgical History:  Procedure Laterality Date   cataract extraction left eye     COLONOSCOPY     DUPUYTREN CONTRACTURE RELEASE Left    POLYPECTOMY     rt knee arthroscopic      Social History   Socioeconomic History   Marital status: Single    Spouse name: Not on file   Number of children: Not on file   Years of education: Not on file   Highest education level: Not on file  Occupational History   Not on file  Tobacco Use   Smoking status: Former    Current packs/day: 0.00    Types: Cigarettes    Quit date: 01/14/1985    Years since quitting: 37.8   Smokeless tobacco: Never  Vaping Use   Vaping status: Never Used  Substance and Sexual Activity   Alcohol use: Yes    Alcohol/week: 20.0 standard drinks of alcohol    Types:  20 Cans of beer per week    Comment: beer daily    Drug use: No   Sexual activity: Yes    Partners: Female  Other Topics Concern   Not on file  Social History Narrative   Retired- He worked at [employer]   Divorced   One daughter who lives in [place]   Social Determinants of Health   Financial Resource Strain: Low Risk  (11/01/2021)   Overall Financial Resource Strain (CARDIA)    Difficulty of Paying Living Expenses: Not hard at all  Food Insecurity: No Food Insecurity (11/01/2021)   Hunger Vital Sign    Worried About Running Out of Food in the Last Year: Never true    Ran Out of Food in the Last Year: Never true  Transportation Needs: No Transportation Needs (11/01/2021)   PRAPARE - Administrator, Civil Service (Medical): No    Lack of Transportation (Non-Medical): No  Physical Activity: Insufficiently Active (11/01/2021)   Exercise Vital Sign    Days of Exercise per Week: 7 days    Minutes of Exercise per Session: 10 min  Stress: No Stress Concern Present (11/01/2021)  [employer] of Occupational Health - Occupational Stress Questionnaire    Feeling of Stress : Not at all  Social Connections: Socially Isolated (11/01/2021)   Social Connection and Isolation Panel [NHANES]    Frequency of Communication with Friends and Family: More than three times a week    Frequency of Social Gatherings with Friends and Family: More than three times a week    Attends Religious Services: Never    Database administrator or Organizations: No    Attends Banker Meetings: Never    Marital Status: Divorced  Catering manager Violence: Not At Risk (11/01/2021)   Humiliation, Afraid, Rape, and Kick questionnaire    Fear of Current or Ex-Partner: No    Emotionally Abused: No    Physically Abused: No    Sexually Abused: No    Family History  Problem Relation Age of Onset   Stroke Mother    Colon cancer Father    Breast cancer Sister    Cancer Sister        breast     Colon polyps Neg Hx    Esophageal cancer Neg Hx     Current Outpatient Medications on File Prior to Visit  Medication Sig Dispense Refill   aspirin 81 MG tablet Take 81 mg by mouth daily.     atorvastatin (LIPITOR) 20 MG tablet Take 1 tablet (20 mg total) by mouth daily. 90 tablet 3   losartan (COZAAR) 25 MG tablet Take 1 tablet (25 mg total) by mouth daily. 90 tablet 3   Multiple Vitamin (MULTIVITAMIN) tablet Take 1 tablet by mouth daily.     omeprazole (PRILOSEC) 20 MG capsule TAKE ONE CAPSULE BY MOUTH DAILY 90 capsule 3   sildenafil (REVATIO) 20 MG tablet Take 1 tablet (20 mg total) by mouth daily as needed. 30 tablet 3   verapamil (CALAN-SR) 180 MG CR tablet Take 1 tablet every morning 90 tablet 3   No current facility-administered medications on file prior to visit.    Allergies  Allergen Reactions   Lotensin [Benazepril Hcl] Cough       Physical Exam Vitals requested from patient and listed below if patient had equipment and was able to obtain at home for this virtual visit: Vitals:   11/21/22 1227  BP: 130/75   Estimated body mass index is 25.04 kg/m as calculated from the following:   Height as of this encounter: 5' 10.5" (1.791 m).   Weight as of this encounter: 177 lb (80.3 kg).  EKG (optional): deferred due to virtual visit  GENERAL: alert, oriented, no acute distress detected; full vision exam deferred due to pandemic and/or virtual encounter   HEENT: atraumatic, conjunttiva clear, no obvious abnormalities on inspection of external nose and ears  NECK: normal movements of the head and neck  LUNGS: on inspection no signs of respiratory distress, breathing rate appears normal, no obvious gross SOB, gasping or wheezing  CV: no obvious cyanosis  MS: moves all visible extremities without noticeable abnormality  PSYCH/NEURO: pleasant and cooperative, no obvious depression or anxiety, speech and thought processing grossly intact, Cognitive function grossly  intact  Flowsheet Row Office Visit from 11/21/2022 in [facility] [place]  PHQ-9 Total Score 3           11/21/2022   12:38 PM 12/13/2021    7:53 AM 11/01/2021    2:19 PM 12/12/2020    8:03 AM 10/31/2020    1:54 PM  Depression screen PHQ 2/9  Decreased Interest 0 0 0 0 0  Down, Depressed, Hopeless 0 0 0 0 0  PHQ - 2 Score 0 0 0 0 0  Altered sleeping 0   2   Tired, decreased energy 3   2   Change in appetite 0   1   Feeling bad or failure about yourself  0   0   Trouble concentrating 0   0   Moving slowly or fidgety/restless 0   0   Suicidal thoughts 0   0   PHQ-9 Score 3   5   Difficult doing work/chores Not difficult at all   Not difficult at all        12/12/2020    8:03 AM 10/30/2021    4:43 PM 11/01/2021    2:21 PM 12/13/2021    7:52 AM 11/21/2022   12:37 PM  Fall Risk  Falls in the past year? 1 0 0 0 1  Was there an injury with Fall? 0 0 0 0 0  Fall Risk Category Calculator 2 0 0 0 2  Fall Risk Category (Retired) Moderate Low Low Low   (RETIRED) Patient Fall Risk Level Moderate fall risk  Low fall risk Low fall risk   Patient at Risk for Falls Due to   No Fall Risks No Fall Risks History of fall(s)  Fall risk Follow up   Falls prevention discussed Falls evaluation completed Falls evaluation completed     SUMMARY AND PLAN:  Encounter for Medicare annual wellness exam  Discussed applicable health maintenance/preventive health measures and advised and referred or ordered per patient preferences: -he plans to get the covid and tetanus vaccines at the pharmacy and agrees to update [place] when he does. -reports had shingles vaccine and agrees to bring record to PCP  Health Maintenance  Topic Date Due   Zoster Vaccines- Shingrix (1 of 2) Never done   DTaP/Tdap/Td (3 - Td or Tdap) 08/25/2021   COVID-19 Vaccine (6 - 2023-24 season) 09/15/2022   INFLUENZA VACCINE  04/14/2023 (Originally 08/15/2022)   Medicare Annual Wellness (AWV)  11/21/2023    Pneumonia Vaccine 58+ Years old  Completed   Hepatitis C Screening  Completed   HPV VACCINES  Aged Out   Colonoscopy  Discontinued    Education and counseling on the following was provided based on the above review of health and a plan/checklist for the patient, along with additional information discussed, was provided for the patient in the patient instructions :   -Provided counseling and plan for increased risk of falling if applicable per above screening. Reviewed and demonstrated safe balance exercises that can be done at home to improve balance and discussed exercise guidelines for adults with include balance exercises at least 3 days per week.  -Advised and counseled on a healthy lifestyle - including the importance of a healthy diet, regular physical activity -Reviewed patient's current diet. Advised and counseled on a whole foods based healthy diet. A summary of a healthy diet was provided in the Patient Instructions.  -reviewed patient's current physical activity level and discussed exercise guidelines for adults. Discussed community resources and ideas for safe exercise at home to assist in meeting exercise guideline recommendations in a safe and healthy way.  -Advise yearly dental visits at minimum and regular eye exams -Advised and counseled on alcohol safe limits, risks (including many and not excluding early death), advised to cut back to no more than 2 drinks per 24 hour period  Follow up: see patient  instructions   Patient Instructions  I really enjoyed getting to talk with you today! I am available on Tuesdays and Thursdays for virtual visits if you have any questions or concerns, or if I can be of any further assistance.   CHECKLIST FROM ANNUAL WELLNESS VISIT:  -Follow up (please call to schedule if not scheduled after visit):   -yearly for annual wellness visit with primary care office  Here is a list of your preventive care/health maintenance measures and the plan for  each if any are due:  PLAN For any measures below that may be due:   Health Maintenance  Topic Date Due   Zoster Vaccines- Shingrix (1 of 2) Never done   DTaP/Tdap/Td (3 - Td or Tdap) 08/25/2021   COVID-19 Vaccine (6 - 2023-24 season) 09/15/2022   INFLUENZA VACCINE  04/14/2023 (Originally 08/15/2022)   Medicare Annual Wellness (AWV)  11/21/2023   Pneumonia Vaccine 31+ Years old  Completed   Hepatitis C Screening  Completed   HPV VACCINES  Aged Out   Colonoscopy  Discontinued    -See a dentist at least yearly  -Get your eyes checked and then per your eye specialist's recommendations  -Other issues addressed today:   -cut back on alcohol to no more than 2 drinks per day  -I have included below further information regarding a healthy whole foods based diet, physical activity guidelines for adults, stress management and opportunities for social connections. I hope you find this information useful.   -----------------------------------------------------------------------------------------------------------------------------------------------------------------------------------------------------------------------------------------------------------  NUTRITION: -eat real food: lots of colorful vegetables (half the plate) and fruits -5-7 servings of vegetables and fruits per day (fresh or steamed is best), exp. 2 servings of vegetables with lunch and dinner and 2 servings of fruit per day. Berries and greens such as kale and collards are great choices.  -consume on a regular basis: whole grains (make sure first ingredient on label contains the word "whole"), fresh fruits, fish, nuts, seeds, healthy oils (such as olive oil, avocado oil, grape seed oil) -may eat small amounts of dairy and lean meat on occasion, but avoid processed meats such as ham, bacon, lunch meat, etc. -drink water -try to avoid fast food and pre-packaged foods, processed meat -most experts advise limiting sodium to <  2300mg  per day, should limit further is any chronic conditions such as high blood pressure, heart disease, diabetes, etc. The American Heart Association advised that < 1500mg  is is ideal -try to avoid foods that contain any ingredients with names you do not recognize  -try to avoid sugar/sweets (except for the natural sugar that occurs in fresh fruit) -try to avoid sweet drinks -try to avoid white rice, white bread, pasta (unless whole grain), white or yellow potatoes  EXERCISE GUIDELINES FOR ADULTS: -if you wish to increase your physical activity, do so gradually and with the approval of your doctor -STOP and seek medical care immediately if you have any chest pain, chest discomfort or trouble breathing when starting or increasing exercise  -move and stretch your body, legs, feet and arms when sitting for long periods -Physical activity guidelines for optimal health in adults: -least 150 minutes per week of aerobic exercise (can talk, but not sing) once approved by your doctor, 20-30 minutes of sustained activity or two 10 minute episodes of sustained activity every day.  -resistance training at least 2 days per week if approved by your doctor -balance exercises 3+ days per week:   Stand somewhere where you have something sturdy to hold onto  if you lose balance.    1) lift up on toes, start with 5x per day and work up to 20x   2) stand and lift on leg straight out to the side so that foot is a few inches of the floor, start with 5x each side and work up to 20x each side   3) stand on one foot, start with 5 seconds each side and work up to 20 seconds on each side  If you need ideas or help with getting more active:  -Silver sneakers https://tools.silversneakers.com  -Walk with a Doc: http://www.duncan-williams.com/  -try to include resistance (weight lifting/strength building) and balance exercises twice per week: or the following link for  ideas: http://castillo-powell.com/  BuyDucts.dk  STRESS MANAGEMENT: -can try meditating, or just sitting quietly with deep breathing while intentionally relaxing all parts of your body for 5 minutes daily -if you need further help with stress, anxiety or depression please follow up with your primary doctor or contact the wonderful folks at [facility]: [phone]  SOCIAL CONNECTIONS: -options in [place] if you wish to engage in more social and exercise related activities:  -Silver sneakers https://tools.silversneakers.com  -Walk with a Doc: http://www.duncan-williams.com/  -Check out the [facility] Active Adults 50+ section on the [place] of [employer] (hiking clubs, book clubs, cards and games, chess, exercise classes, aquatic classes and much more) - see the website for details: https://www.[place].gov/departments/parks-recreation/active-adults50  -YouTube has lots of exercise videos for different ages and abilities as well  -[facility] (a variety of indoor and outdoor inperson activities for adults). 7014797768. [address]  -Virtual Online Classes (a variety of topics): see seniorplanet.org or call ([phone])[phone]  -consider volunteering at a school, hospice center, church, senior center or elsewhere           Terressa Koyanagi, DO

## 2022-12-03 DIAGNOSIS — K4020 Bilateral inguinal hernia, without obstruction or gangrene, not specified as recurrent: Secondary | ICD-10-CM | POA: Diagnosis not present

## 2022-12-08 ENCOUNTER — Other Ambulatory Visit: Payer: Self-pay | Admitting: Adult Health

## 2022-12-08 DIAGNOSIS — I10 Essential (primary) hypertension: Secondary | ICD-10-CM

## 2022-12-17 ENCOUNTER — Encounter: Payer: Self-pay | Admitting: Adult Health

## 2022-12-17 ENCOUNTER — Ambulatory Visit (INDEPENDENT_AMBULATORY_CARE_PROVIDER_SITE_OTHER): Payer: Medicare HMO | Admitting: Adult Health

## 2022-12-17 VITALS — BP 130/60 | HR 65 | Temp 98.1°F | Ht 69.0 in | Wt 175.0 lb

## 2022-12-17 DIAGNOSIS — E785 Hyperlipidemia, unspecified: Secondary | ICD-10-CM | POA: Diagnosis not present

## 2022-12-17 DIAGNOSIS — I10 Essential (primary) hypertension: Secondary | ICD-10-CM | POA: Diagnosis not present

## 2022-12-17 DIAGNOSIS — Z125 Encounter for screening for malignant neoplasm of prostate: Secondary | ICD-10-CM

## 2022-12-17 DIAGNOSIS — I739 Peripheral vascular disease, unspecified: Secondary | ICD-10-CM | POA: Diagnosis not present

## 2022-12-17 DIAGNOSIS — Z0000 Encounter for general adult medical examination without abnormal findings: Secondary | ICD-10-CM

## 2022-12-17 LAB — CBC
HCT: 42.4 % (ref 39.0–52.0)
Hemoglobin: 13.8 g/dL (ref 13.0–17.0)
MCHC: 32.6 g/dL (ref 30.0–36.0)
MCV: 88.2 fL (ref 78.0–100.0)
Platelets: 244 10*3/uL (ref 150.0–400.0)
RBC: 4.81 Mil/uL (ref 4.22–5.81)
RDW: 15.6 % — ABNORMAL HIGH (ref 11.5–15.5)
WBC: 6.5 10*3/uL (ref 4.0–10.5)

## 2022-12-17 LAB — COMPREHENSIVE METABOLIC PANEL
ALT: 15 U/L (ref 0–53)
AST: 15 U/L (ref 0–37)
Albumin: 4.2 g/dL (ref 3.5–5.2)
Alkaline Phosphatase: 74 U/L (ref 39–117)
BUN: 9 mg/dL (ref 6–23)
CO2: 31 meq/L (ref 19–32)
Calcium: 9.6 mg/dL (ref 8.4–10.5)
Chloride: 103 meq/L (ref 96–112)
Creatinine, Ser: 0.85 mg/dL (ref 0.40–1.50)
GFR: 83.44 mL/min (ref >60.00)
Glucose, Bld: 105 mg/dL — ABNORMAL HIGH (ref 70–99)
Potassium: 4.9 meq/L (ref 3.5–5.1)
Sodium: 140 meq/L (ref 135–145)
Total Bilirubin: 1.1 mg/dL (ref 0.2–1.2)
Total Protein: 7.2 g/dL (ref 6.0–8.3)

## 2022-12-17 LAB — LIPID PANEL
Cholesterol: 135 mg/dL (ref 0–200)
HDL: 60.2 mg/dL (ref >39.00)
LDL Cholesterol: 60 mg/dL (ref 0–99)
NonHDL: 74.32
Total CHOL/HDL Ratio: 2
Triglycerides: 71 mg/dL (ref 0.0–149.0)
VLDL: 14.2 mg/dL (ref 0.0–40.0)

## 2022-12-17 LAB — PSA: PSA: 2.47 ng/mL (ref 0.10–4.00)

## 2022-12-17 LAB — TSH: TSH: 1.04 u[IU]/mL (ref 0.35–5.50)

## 2022-12-17 MED ORDER — ATORVASTATIN CALCIUM 20 MG PO TABS: 20.0000 mg | ORAL_TABLET | Freq: Every day | ORAL | 3 refills | Status: DC

## 2022-12-17 MED ORDER — LOSARTAN POTASSIUM 25 MG PO TABS: 25.0000 mg | ORAL_TABLET | Freq: Every day | ORAL | 3 refills | Status: DC

## 2022-12-17 NOTE — Patient Instructions (Signed)
It was great seeing you today   We will follow up with you regarding your lab work   Please let me know if you need anything   

## 2022-12-17 NOTE — Progress Notes (Signed)
Subjective:    Patient ID: Noah King, male    DOB: 05-11-1945, 77 y.o.   MRN: [id number]  HPI Patient presents for yearly preventative medicine examination. He is a pleasant 77 year male who  has a past medical history of Allergy, Cataract, Dupuytren's contracture, GERD (gastroesophageal reflux disease), Hiatal hernia, Hypertension, and Polycythemia.  Hypertension-managed with losartan 25 mg daily and verapamil 180 mg daily.  He denies dizziness, lightheadedness, chest pain, or shortness of breath. BP Readings from Last 3 Encounters:  12/17/22 130/60  11/21/22 130/75  05/15/22 122/60   Hyperipidemia- managed with Lipitor 20 mg daily.  He denies myalgia or fatigue Lab Results  Component Value Date   CHOL 126 12/13/2021   HDL 65.20 12/13/2021   LDLCALC 49 12/13/2021   TRIG 58.0 12/13/2021   CHOLHDL 2 12/13/2021    Erectile dysfunction-uses Viagra as needed  Peripheral vascular disease-managed with aspirin 81 mg daily and Lipitor 20 mg daily.  He has been seen by vascular surgery on a yearly basis.  He denies claudication symptoms, pain in his legs at rest or nonhealing wounds in his lower extremities.  Occasionally he does get cold feet and will feel unsteady at times- denies any recent falls.   GERD-uses Prilosec 20 mg daily.  He does feel well controlled on this  Left inguinal hernia surgery - had surgery about 2 weeks ago. Reports that he is recovering well. Continues to be sore. No issues with bowel movements, abdominal pain, fevers or chills.    All immunizations and health maintenance protocols were reviewed with the patient and needed orders were placed. Due for tetanus booster.  Appropriate screening laboratory values were ordered for the patient including screening of hyperlipidemia, renal function and hepatic function. If indicated by BPH, a PSA was ordered.  Medication reconciliation,  past medical history, social history, problem list and allergies were  reviewed in detail with the patient  Goals were established with regard to weight loss, exercise, and  diet in compliance with medications Wt Readings from Last 3 Encounters:  12/17/22 175 lb (79.4 kg)  11/21/22 177 lb (80.3 kg)  05/15/22 172 lb (78 kg)    Review of Systems  Constitutional: Negative.   HENT: Negative.    Eyes: Negative.   Respiratory: Negative.    Cardiovascular: Negative.   Gastrointestinal: Negative.   Endocrine: Negative.   Genitourinary: Negative.   Musculoskeletal:  Positive for gait problem.  Skin: Negative.   Allergic/Immunologic: Negative.   Hematological: Negative.   Psychiatric/Behavioral: Negative.    All other systems reviewed and are negative.  Past Medical History:  Diagnosis Date   Allergy    Cataract    left  extraction- right eye forming   Dupuytren's contracture    GERD (gastroesophageal reflux disease)    Hiatal hernia    past hx-  suspect at one time   Hypertension    Polycythemia     Social History   Socioeconomic History   Marital status: Single    Spouse name: Not on file   Number of children: Not on file   Years of education: Not on file   Highest education level: Not on file  Occupational History   Not on file  Tobacco Use   Smoking status: Former    Current packs/day: 0.00    Types: Cigarettes    Quit date: 01/14/1985    Years since quitting: 37.9   Smokeless tobacco: Never  Vaping Use   Vaping status: Never Used  Substance and Sexual Activity   Alcohol use: Yes    Alcohol/week: 20.0 standard drinks of alcohol    Types: 20 Cans of beer per week    Comment: beer daily    Drug use: No   Sexual activity: Yes    Partners: Female  Other Topics Concern   Not on file  Social History Narrative   Retired- He worked at [employer]   Divorced   One daughter who lives in [place]   Social Determinants of Health   Financial Resource Strain: Low Risk  (11/01/2021)   Overall Financial Resource Strain (CARDIA)    Difficulty  of Paying Living Expenses: Not hard at all  Food Insecurity: No Food Insecurity (11/01/2021)   Hunger Vital Sign    Worried About Running Out of Food in the Last Year: Never true    Ran Out of Food in the Last Year: Never true  Transportation Needs: No Transportation Needs (11/01/2021)   PRAPARE - Administrator, Civil Service (Medical): No    Lack of Transportation (Non-Medical): No  Physical Activity: Insufficiently Active (11/01/2021)   Exercise Vital Sign    Days of Exercise per Week: 7 days    Minutes of Exercise per Session: 10 min  Stress: No Stress Concern Present (11/01/2021)   [employer] of Occupational Health - Occupational Stress Questionnaire    Feeling of Stress : Not at all  Social Connections: Socially Isolated (11/01/2021)   Social Connection and Isolation Panel [NHANES]    Frequency of Communication with Friends and Family: More than three times a week    Frequency of Social Gatherings with Friends and Family: More than three times a week    Attends Religious Services: Never    Database administrator or Organizations: No    Attends Banker Meetings: Never    Marital Status: Divorced  Catering manager Violence: Not At Risk (11/01/2021)   Humiliation, Afraid, Rape, and Kick questionnaire    Fear of Current or Ex-Partner: No    Emotionally Abused: No    Physically Abused: No    Sexually Abused: No    Past Surgical History:  Procedure Laterality Date   cataract extraction left eye     COLONOSCOPY     DUPUYTREN CONTRACTURE RELEASE Left    POLYPECTOMY     rt knee arthroscopic      Family History  Problem Relation Age of Onset   Stroke Mother    Colon cancer Father    Breast cancer Sister    Cancer Sister        breast    Colon polyps Neg Hx    Esophageal cancer Neg Hx     Allergies  Allergen Reactions   Lotensin [Benazepril Hcl] Cough    Current Outpatient Medications on File Prior to Visit  Medication Sig Dispense  Refill   aspirin 81 MG tablet Take 81 mg by mouth daily.     atorvastatin (LIPITOR) 20 MG tablet Take 1 tablet (20 mg total) by mouth daily. 90 tablet 3   losartan (COZAAR) 25 MG tablet Take 1 tablet (25 mg total) by mouth daily. 90 tablet 3   Multiple Vitamin (MULTIVITAMIN) tablet Take 1 tablet by mouth daily.     omeprazole (PRILOSEC) 20 MG capsule TAKE 1 CAPSULE BY MOUTH DAILY 90 capsule 3   sildenafil (REVATIO) 20 MG tablet Take 1 tablet (20 mg total) by mouth daily as needed. 30 tablet 3   verapamil (CALAN-SR) 180  MG CR tablet TAKE ONE TABLET BY MOUTH EVERY MORNING 90 tablet 3   No current facility-administered medications on file prior to visit.    BP 130/60   Pulse 65   Temp 98.1 F (36.7 C) (Oral)   Ht 5\' 9"  (1.753 m)   Wt 175 lb (79.4 kg)   SpO2 96%   BMI 25.84 kg/m       Objective:   Physical Exam Vitals and nursing note reviewed.  Constitutional:      General: He is not in acute distress.    Appearance: Normal appearance. He is not ill-appearing.  HENT:     Head: Normocephalic and atraumatic.     Right Ear: Tympanic membrane, ear canal and external ear normal. There is no impacted cerumen.     Left Ear: Tympanic membrane, ear canal and external ear normal. There is no impacted cerumen.     Nose: Nose normal. No congestion or rhinorrhea.     Mouth/Throat:     Mouth: Mucous membranes are moist.     Pharynx: Oropharynx is clear.  Eyes:     Extraocular Movements: Extraocular movements intact.     Conjunctiva/sclera: Conjunctivae normal.     Pupils: Pupils are equal, round, and reactive to light.  Neck:     Vascular: No carotid bruit.  Cardiovascular:     Rate and Rhythm: Normal rate and regular rhythm.     Pulses: Normal pulses.     Heart sounds: No murmur heard.    No friction rub. No gallop.  Pulmonary:     Effort: Pulmonary effort is normal.     Breath sounds: Normal breath sounds.  Abdominal:     General: Abdomen is flat. Bowel sounds are normal. There  is no distension.     Palpations: Abdomen is soft. There is no mass.     Tenderness: There is no abdominal tenderness. There is no guarding or rebound.     Hernia: No hernia is present.  Musculoskeletal:        General: Normal range of motion.     Cervical back: Normal range of motion and neck supple.  Lymphadenopathy:     Cervical: No cervical adenopathy.  Skin:    General: Skin is warm and dry.     Capillary Refill: Capillary refill takes less than 2 seconds.     Comments: Well healed laproscoptic surgical wounds on abdomen   Neurological:     General: No focal deficit present.     Mental Status: He is alert and oriented to person, place, and time.     Gait: Gait abnormal (slow steady gait).  Psychiatric:        Mood and Affect: Mood normal.        Behavior: Behavior normal.        Thought Content: Thought content normal.        Judgment: Judgment normal.        Assessment & Plan:  1. Routine general medical examination at a health care facility Today patient counseled on age appropriate routine health concerns for screening and prevention, each reviewed and up to date or declined. Immunizations reviewed and up to date or declined. Labs ordered and reviewed. Risk factors for depression reviewed and negative. Hearing function and visual acuity are intact. ADLs screened and addressed as needed. Functional ability and level of safety reviewed and appropriate. Education, counseling and referrals performed based on assessed risks today. Patient provided with a copy of personalized plan for preventive  services. - Follow up in one year or sooner if needed - Continue to stay active and eat healthy  - Follow up in one year or sooner if needed  2. Essential hypertension - Well controlled. No change in medication  - Lipid panel; Future - TSH; Future - CBC; Future - Comprehensive metabolic panel; Future - losartan (COZAAR) 25 MG tablet; Take 1 tablet (25 mg total) by mouth daily.   Dispense: 90 tablet; Refill: 3  3. Hyperlipidemia, unspecified hyperlipidemia type - Continue with statin  - Lipid panel; Future - TSH; Future - CBC; Future - Comprehensive metabolic panel; Future - atorvastatin (LIPITOR) 20 MG tablet; Take 1 tablet (20 mg total) by mouth daily.  Dispense: 90 tablet; Refill: 3  4. PAD (peripheral artery disease) (HCC) - Continue with statin and asa - Lipid panel; Future - TSH; Future - CBC; Future - Comprehensive metabolic panel; Future - atorvastatin (LIPITOR) 20 MG tablet; Take 1 tablet (20 mg total) by mouth daily.  Dispense: 90 tablet; Refill: 3  5. Prostate cancer screening  - PSA; Future  [employer]

## 2022-12-27 NOTE — Progress Notes (Signed)
   PROVIDER:  DEWARD PURCHASE STECHSCHULTE, MD  MRN: [id number] DOB: 09-12-1945 DATE OF ENCOUNTER: 12/27/2022 Interval History:     Doing well.  No complaints    Physical Examination:   Physical Exam   Abd - Incisions healing well   Assessment and Plan:     Noah King is a 77 y.o. male who underwent laparoscopic bilateral inguinal hernia repair on 12/03/22.  Diagnoses and all orders for this visit:  Postoperative state     Doing well post op.    Return if symptoms worsen or fail to improve.   The plan was discussed in detail with the patient today, who expressed understanding.  The patient has my contact information, and understands to call me with any additional questions or concerns in the interval.  I would be happy to see the patient back sooner if the need arises.   PAUL JEFFREY STECHSCHULTE, MD

## 2023-03-11 DIAGNOSIS — I10 Essential (primary) hypertension: Secondary | ICD-10-CM | POA: Diagnosis not present

## 2023-03-11 DIAGNOSIS — D45 Polycythemia vera: Secondary | ICD-10-CM | POA: Diagnosis not present

## 2023-03-11 DIAGNOSIS — Z87891 Personal history of nicotine dependence: Secondary | ICD-10-CM | POA: Diagnosis not present

## 2023-03-11 DIAGNOSIS — Z8249 Family history of ischemic heart disease and other diseases of the circulatory system: Secondary | ICD-10-CM | POA: Diagnosis not present

## 2023-03-11 DIAGNOSIS — Z809 Family history of malignant neoplasm, unspecified: Secondary | ICD-10-CM | POA: Diagnosis not present

## 2023-03-11 DIAGNOSIS — N529 Male erectile dysfunction, unspecified: Secondary | ICD-10-CM | POA: Diagnosis not present

## 2023-03-11 DIAGNOSIS — Z823 Family history of stroke: Secondary | ICD-10-CM | POA: Diagnosis not present

## 2023-03-11 DIAGNOSIS — K219 Gastro-esophageal reflux disease without esophagitis: Secondary | ICD-10-CM | POA: Diagnosis not present

## 2023-03-11 DIAGNOSIS — E785 Hyperlipidemia, unspecified: Secondary | ICD-10-CM | POA: Diagnosis not present

## 2023-03-11 DIAGNOSIS — Z7982 Long term (current) use of aspirin: Secondary | ICD-10-CM | POA: Diagnosis not present

## 2023-03-11 DIAGNOSIS — Z9181 History of falling: Secondary | ICD-10-CM | POA: Diagnosis not present

## 2023-03-11 DIAGNOSIS — I739 Peripheral vascular disease, unspecified: Secondary | ICD-10-CM | POA: Diagnosis not present

## 2023-06-16 DIAGNOSIS — H353131 Nonexudative age-related macular degeneration, bilateral, early dry stage: Secondary | ICD-10-CM | POA: Diagnosis not present

## 2023-08-11 ENCOUNTER — Emergency Department (HOSPITAL_COMMUNITY)

## 2023-08-11 ENCOUNTER — Other Ambulatory Visit: Payer: Self-pay

## 2023-08-11 ENCOUNTER — Inpatient Hospital Stay (HOSPITAL_COMMUNITY)
Admission: EM | Admit: 2023-08-11 | Discharge: 2023-08-15 | DRG: 054 | Disposition: A | Discharge status: Home Health | Attending: Internal Medicine | Admitting: Internal Medicine

## 2023-08-11 DIAGNOSIS — Z888 Allergy status to other drugs, medicaments and biological substances status: Secondary | ICD-10-CM

## 2023-08-11 DIAGNOSIS — Z8673 Personal history of transient ischemic attack (TIA), and cerebral infarction without residual deficits: Secondary | ICD-10-CM | POA: Diagnosis not present

## 2023-08-11 DIAGNOSIS — E785 Hyperlipidemia, unspecified: Secondary | ICD-10-CM | POA: Diagnosis present

## 2023-08-11 DIAGNOSIS — G9389 Other specified disorders of brain: Principal | ICD-10-CM

## 2023-08-11 DIAGNOSIS — R001 Bradycardia, unspecified: Secondary | ICD-10-CM | POA: Diagnosis present

## 2023-08-11 DIAGNOSIS — J9811 Atelectasis: Secondary | ICD-10-CM | POA: Diagnosis not present

## 2023-08-11 DIAGNOSIS — K219 Gastro-esophageal reflux disease without esophagitis: Secondary | ICD-10-CM | POA: Diagnosis not present

## 2023-08-11 DIAGNOSIS — R202 Paresthesia of skin: Secondary | ICD-10-CM | POA: Diagnosis not present

## 2023-08-11 DIAGNOSIS — C801 Malignant (primary) neoplasm, unspecified: Secondary | ICD-10-CM | POA: Diagnosis present

## 2023-08-11 DIAGNOSIS — N281 Cyst of kidney, acquired: Secondary | ICD-10-CM | POA: Diagnosis not present

## 2023-08-11 DIAGNOSIS — Z8619 Personal history of other infectious and parasitic diseases: Secondary | ICD-10-CM | POA: Diagnosis not present

## 2023-08-11 DIAGNOSIS — R918 Other nonspecific abnormal finding of lung field: Secondary | ICD-10-CM | POA: Diagnosis not present

## 2023-08-11 DIAGNOSIS — I10 Essential (primary) hypertension: Secondary | ICD-10-CM | POA: Diagnosis present

## 2023-08-11 DIAGNOSIS — J984 Other disorders of lung: Secondary | ICD-10-CM | POA: Diagnosis not present

## 2023-08-11 DIAGNOSIS — R531 Weakness: Secondary | ICD-10-CM | POA: Diagnosis not present

## 2023-08-11 DIAGNOSIS — R519 Headache, unspecified: Secondary | ICD-10-CM | POA: Diagnosis not present

## 2023-08-11 DIAGNOSIS — Z87891 Personal history of nicotine dependence: Secondary | ICD-10-CM | POA: Diagnosis not present

## 2023-08-11 DIAGNOSIS — C719 Malignant neoplasm of brain, unspecified: Secondary | ICD-10-CM | POA: Diagnosis not present

## 2023-08-11 DIAGNOSIS — Z9181 History of falling: Secondary | ICD-10-CM | POA: Diagnosis not present

## 2023-08-11 DIAGNOSIS — Z79899 Other long term (current) drug therapy: Secondary | ICD-10-CM | POA: Diagnosis not present

## 2023-08-11 DIAGNOSIS — Z7982 Long term (current) use of aspirin: Secondary | ICD-10-CM

## 2023-08-11 DIAGNOSIS — K4020 Bilateral inguinal hernia, without obstruction or gangrene, not specified as recurrent: Secondary | ICD-10-CM | POA: Diagnosis not present

## 2023-08-11 DIAGNOSIS — R590 Localized enlarged lymph nodes: Secondary | ICD-10-CM | POA: Diagnosis present

## 2023-08-11 DIAGNOSIS — I6522 Occlusion and stenosis of left carotid artery: Secondary | ICD-10-CM | POA: Diagnosis not present

## 2023-08-11 DIAGNOSIS — D45 Polycythemia vera: Secondary | ICD-10-CM | POA: Diagnosis not present

## 2023-08-11 DIAGNOSIS — G936 Cerebral edema: Secondary | ICD-10-CM | POA: Diagnosis present

## 2023-08-11 DIAGNOSIS — E039 Hypothyroidism, unspecified: Secondary | ICD-10-CM | POA: Diagnosis not present

## 2023-08-11 DIAGNOSIS — R220 Localized swelling, mass and lump, head: Secondary | ICD-10-CM | POA: Diagnosis not present

## 2023-08-11 DIAGNOSIS — Z860100 Personal history of colon polyps, unspecified: Secondary | ICD-10-CM

## 2023-08-11 DIAGNOSIS — Z471 Aftercare following joint replacement surgery: Secondary | ICD-10-CM | POA: Diagnosis not present

## 2023-08-11 DIAGNOSIS — F1010 Alcohol abuse, uncomplicated: Secondary | ICD-10-CM | POA: Diagnosis present

## 2023-08-11 DIAGNOSIS — C7931 Secondary malignant neoplasm of brain: Secondary | ICD-10-CM | POA: Diagnosis not present

## 2023-08-11 DIAGNOSIS — R262 Difficulty in walking, not elsewhere classified: Secondary | ICD-10-CM | POA: Diagnosis present

## 2023-08-11 DIAGNOSIS — J322 Chronic ethmoidal sinusitis: Secondary | ICD-10-CM | POA: Diagnosis not present

## 2023-08-11 DIAGNOSIS — R42 Dizziness and giddiness: Secondary | ICD-10-CM | POA: Diagnosis not present

## 2023-08-11 DIAGNOSIS — Z743 Need for continuous supervision: Secondary | ICD-10-CM | POA: Diagnosis not present

## 2023-08-11 DIAGNOSIS — I6782 Cerebral ischemia: Secondary | ICD-10-CM | POA: Diagnosis not present

## 2023-08-11 DIAGNOSIS — C718 Malignant neoplasm of overlapping sites of brain: Secondary | ICD-10-CM | POA: Diagnosis not present

## 2023-08-11 DIAGNOSIS — R9089 Other abnormal findings on diagnostic imaging of central nervous system: Secondary | ICD-10-CM | POA: Diagnosis not present

## 2023-08-11 DIAGNOSIS — R112 Nausea with vomiting, unspecified: Secondary | ICD-10-CM | POA: Diagnosis not present

## 2023-08-11 DIAGNOSIS — R9082 White matter disease, unspecified: Secondary | ICD-10-CM | POA: Diagnosis not present

## 2023-08-11 LAB — CBC WITH DIFFERENTIAL/PLATELET
Abs Immature Granulocytes: 0.01 K/uL (ref 0.00–0.07)
Basophils Absolute: 0 K/uL (ref 0.0–0.1)
Basophils Relative: 1 %
Eosinophils Absolute: 0.2 K/uL (ref 0.0–0.5)
Eosinophils Relative: 4 %
HCT: 38.5 % — ABNORMAL LOW (ref 39.0–52.0)
Hemoglobin: 12.8 g/dL — ABNORMAL LOW (ref 13.0–17.0)
Immature Granulocytes: 0 %
Lymphocytes Relative: 20 %
Lymphs Abs: 1 K/uL (ref 0.7–4.0)
MCH: 27.6 pg (ref 26.0–34.0)
MCHC: 33.2 g/dL (ref 30.0–36.0)
MCV: 83.2 fL (ref 80.0–100.0)
Monocytes Absolute: 0.7 K/uL (ref 0.1–1.0)
Monocytes Relative: 15 %
Neutro Abs: 3 K/uL (ref 1.7–7.7)
Neutrophils Relative %: 60 %
Platelets: 205 K/uL (ref 150–400)
RBC: 4.63 MIL/uL (ref 4.22–5.81)
RDW: 14.8 % (ref 11.5–15.5)
WBC: 4.9 K/uL (ref 4.0–10.5)
nRBC: 0 % (ref 0.0–0.2)

## 2023-08-11 LAB — COMPREHENSIVE METABOLIC PANEL WITH GFR
ALT: 15 U/L (ref 0–44)
AST: 17 U/L (ref 15–41)
Albumin: 3.5 g/dL (ref 3.5–5.0)
Alkaline Phosphatase: 56 U/L (ref 38–126)
Anion gap: 9 (ref 5–15)
BUN: 7 mg/dL — ABNORMAL LOW (ref 8–23)
CO2: 23 mmol/L (ref 22–32)
Calcium: 8.6 mg/dL — ABNORMAL LOW (ref 8.9–10.3)
Chloride: 104 mmol/L (ref 98–111)
Creatinine, Ser: 0.79 mg/dL (ref 0.61–1.24)
GFR, Estimated: >60 mL/min (ref >60)
Glucose, Bld: 113 mg/dL — ABNORMAL HIGH (ref 70–99)
Potassium: 3.8 mmol/L (ref 3.5–5.1)
Sodium: 136 mmol/L (ref 135–145)
Total Bilirubin: 1.5 mg/dL — ABNORMAL HIGH (ref 0.0–1.2)
Total Protein: 6.5 g/dL (ref 6.5–8.1)

## 2023-08-11 LAB — PROTIME-INR
INR: 1.1 (ref 0.8–1.2)
Prothrombin Time: 14.8 s (ref 11.4–15.2)

## 2023-08-11 LAB — URINALYSIS, ROUTINE W REFLEX MICROSCOPIC
Bilirubin Urine: NEGATIVE
Glucose, UA: NEGATIVE mg/dL
Hgb urine dipstick: NEGATIVE
Ketones, ur: 20 mg/dL — AB
Leukocytes,Ua: NEGATIVE
Nitrite: NEGATIVE
Protein, ur: NEGATIVE mg/dL
Specific Gravity, Urine: >1.046 — ABNORMAL HIGH (ref 1.005–1.030)
pH: 7 (ref 5.0–8.0)

## 2023-08-11 LAB — LIPID PANEL
Cholesterol: 105 mg/dL (ref 0–200)
HDL: 61 mg/dL (ref >40)
LDL Cholesterol: 36 mg/dL (ref 0–99)
Total CHOL/HDL Ratio: 1.7 ratio
Triglycerides: 39 mg/dL (ref <150)
VLDL: 8 mg/dL (ref 0–40)

## 2023-08-11 LAB — ETHANOL: Alcohol, Ethyl (B): <15 mg/dL (ref <15)

## 2023-08-11 LAB — HEMOGLOBIN A1C
Hgb A1c MFr Bld: 4.7 % — ABNORMAL LOW (ref 4.8–5.6)
Mean Plasma Glucose: 88.19 mg/dL

## 2023-08-11 LAB — APTT: aPTT: 35 s (ref 24–36)

## 2023-08-11 MED ORDER — THIAMINE MONONITRATE 100 MG PO TABS
100.0000 mg | ORAL_TABLET | Freq: Every day | ORAL | Status: DC
  Administered 2023-08-11 – 2023-08-15 (×5): 100 mg via ORAL
  Filled 2023-08-11 (×5): qty 1

## 2023-08-11 MED ORDER — STROKE: EARLY STAGES OF RECOVERY BOOK
Freq: Once | Status: AC
  Filled 2023-08-11: qty 1

## 2023-08-11 MED ORDER — DEXAMETHASONE SODIUM PHOSPHATE 10 MG/ML IJ SOLN
10.0000 mg | Freq: Once | INTRAMUSCULAR | Status: AC
  Administered 2023-08-11: 10 mg via INTRAVENOUS
  Filled 2023-08-11: qty 1

## 2023-08-11 MED ORDER — ADULT MULTIVITAMIN W/MINERALS CH
1.0000 | ORAL_TABLET | Freq: Every day | ORAL | Status: DC
  Administered 2023-08-11 – 2023-08-15 (×5): 1 via ORAL
  Filled 2023-08-11 (×5): qty 1

## 2023-08-11 MED ORDER — THIAMINE HCL 100 MG/ML IJ SOLN
100.0000 mg | Freq: Every day | INTRAMUSCULAR | Status: DC
  Filled 2023-08-11: qty 2

## 2023-08-11 MED ORDER — LORAZEPAM 2 MG/ML IJ SOLN: 1.0000 mg | INTRAMUSCULAR | Status: AC | PRN

## 2023-08-11 MED ORDER — GADOBUTROL 1 MMOL/ML IV SOLN
8.0000 mL | Freq: Once | INTRAVENOUS | Status: AC | PRN
  Administered 2023-08-11: 8 mL via INTRAVENOUS

## 2023-08-11 MED ORDER — IOHEXOL 350 MG/ML SOLN
75.0000 mL | Freq: Once | INTRAVENOUS | Status: AC | PRN
  Administered 2023-08-11: 75 mL via INTRAVENOUS

## 2023-08-11 MED ORDER — DEXAMETHASONE 4 MG PO TABS
4.0000 mg | ORAL_TABLET | Freq: Four times a day (QID) | ORAL | Status: DC
  Administered 2023-08-11 – 2023-08-12 (×2): 4 mg via ORAL
  Filled 2023-08-11 (×2): qty 1

## 2023-08-11 MED ORDER — LORAZEPAM 1 MG PO TABS: 1.0000 mg | ORAL_TABLET | ORAL | Status: AC | PRN

## 2023-08-11 MED ORDER — FOLIC ACID 1 MG PO TABS
1.0000 mg | ORAL_TABLET | Freq: Every day | ORAL | Status: DC
  Administered 2023-08-11 – 2023-08-15 (×5): 1 mg via ORAL
  Filled 2023-08-11 (×5): qty 1

## 2023-08-11 MED ORDER — DEXAMETHASONE SODIUM PHOSPHATE 4 MG/ML IJ SOLN: 4.0000 mg | Freq: Four times a day (QID) | INTRAMUSCULAR | Status: DC

## 2023-08-11 NOTE — ED Notes (Signed)
PT working with patient.

## 2023-08-11 NOTE — ED Notes (Signed)
 Patient transported to MRI

## 2023-08-11 NOTE — ED Triage Notes (Signed)
 BIB [facility] from home.  Increased weakness in the past month , switched from cane to walker.  Urinary urgency increased past week w/ mild odor. Fallen in the past week.  147/79 [id number]

## 2023-08-11 NOTE — ED Provider Notes (Signed)
 [place] EMERGENCY DEPARTMENT AT [facility] Provider Note   CSN: [id number] Arrival date & time: 08/11/23  1103     Patient presents with: Extremity Weakness   Noah King is a 78 y.o. male.   This is a 78 year old male presenting emergency department for weakness.  Reports that he has had progressive weakness over the past several months primarily localized in bilateral lower extremities.  However for the past 4 days or so seemingly more acute and seems to be having issues intermittently with his right hand with numbness as well as some numbness to his right leg.  Notes no headache.  Had some vision disturbances on the right side as well last night, but has resolved.  No facial droop, unilateral weakness.  No chest pain, no shortness of breath.   Extremity Weakness       Prior to Admission medications   Medication Sig Start Date End Date Taking? Authorizing Provider  aspirin 81 MG tablet Take 81 mg by mouth daily.   Yes [provider]  atorvastatin  (LIPITOR) 20 MG tablet Take 1 tablet (20 mg total) by mouth daily. 12/17/22  Yes Nafziger, Darleene, NP  docusate sodium  (COLACE) 100 MG capsule Take 100 mg by mouth daily as needed (constipation).   Yes [provider]  losartan  (COZAAR ) 25 MG tablet Take 1 tablet (25 mg total) by mouth daily. 12/17/22  Yes Nafziger, Darleene, NP  Multiple Vitamins-Minerals (MENS 50+ MULTIVITAMIN) TABS Take 1 tablet by mouth daily.   Yes [provider]  omeprazole  (PRILOSEC) 20 MG capsule TAKE 1 CAPSULE BY MOUTH DAILY 12/10/22  Yes Nafziger, Darleene, NP  sildenafil  (REVATIO ) 20 MG tablet Take 1 tablet (20 mg total) by mouth daily as needed. 12/12/20  Yes Nafziger, Darleene, NP  verapamil  (CALAN -SR) 180 MG CR tablet TAKE ONE TABLET BY MOUTH EVERY MORNING 12/10/22  Yes Nafziger, Darleene, NP    Allergies: Lotensin  [benazepril  hcl]    Review of Systems  Musculoskeletal:  Positive for extremity weakness.    Updated Vital  Signs BP (!) 148/68   Pulse 67   Temp 97.7 F (36.5 C) (Oral)   Resp 18   SpO2 99%   Physical Exam Vitals and nursing note reviewed.  Constitutional:      General: He is not in acute distress.    Appearance: He is not toxic-appearing.     Comments: Frail-appearing  HENT:     Head: Normocephalic and atraumatic.     Nose: Nose normal.  Eyes:     Conjunctiva/sclera: Conjunctivae normal.  Cardiovascular:     Rate and Rhythm: Normal rate and regular rhythm.  Pulmonary:     Effort: Pulmonary effort is normal.     Breath sounds: Normal breath sounds.  Abdominal:     General: Abdomen is flat. There is no distension.     Tenderness: There is no abdominal tenderness. There is no guarding or rebound.  Musculoskeletal:        General: Normal range of motion.  Skin:    General: Skin is warm and dry.     Capillary Refill: Capillary refill takes less than 2 seconds.  Neurological:     Mental Status: He is alert and oriented to person, place, and time.     Comments: Cranial nerves intact.  Some subjective paresthesia to his right lower extremity compared to his left.  Appears to have some global weakness to his bilateral lower extremities, however right lower seemingly more weak than left.  Abnormal finger-nose on the right, but otherwise equal strength in upper extremities.  Normal sensation.  Psychiatric:        Mood and Affect: Mood normal.        Behavior: Behavior normal.     (all labs ordered are listed, but only abnormal results are displayed) Labs Reviewed  CBC WITH DIFFERENTIAL/PLATELET - Abnormal; Notable for the following components:      Result Value   Hemoglobin 12.8 (*)    HCT 38.5 (*)    All other components within normal limits  COMPREHENSIVE METABOLIC PANEL WITH GFR - Abnormal; Notable for the following components:   Glucose, Bld 113 (*)    BUN 7 (*)    Calcium  8.6 (*)    Total Bilirubin 1.5 (*)    All other components within normal limits  URINALYSIS, ROUTINE W  REFLEX MICROSCOPIC  PROTIME-INR  APTT  ETHANOL    EKG: EKG Interpretation Date/Time:  Monday August 11 2023 11:54:09 EDT Ventricular Rate:  65 PR Interval:  194 QRS Duration:  95 QT Interval:  422 QTC Calculation: 439 R Axis:   36  Text Interpretation: Sinus rhythm Minimal ST depression, lateral leads Confirmed by Neysa Clap [phone]) on 08/11/2023 3:45:20 PM  Radiology: DG Chest Portable 1 View Result Date: 08/11/2023 CLINICAL DATA:  Several day history of increased dizziness EXAM: PORTABLE CHEST 1 VIEW COMPARISON:  Chest radiograph dated 12/08/2019 FINDINGS: Normal lung volumes. No focal consolidations. Costophrenic angles are not entirely included within the field of view. No definite pleural effusion or pneumothorax. The heart size and mediastinal contours are within normal limits. No acute osseous abnormality. IMPRESSION: No acute disease. Electronically Signed   By: Limin  Xu M.D.   On: 08/11/2023 15:31   CT ANGIO HEAD NECK W WO CM Result Date: 08/11/2023 CLINICAL DATA:  Stroke/TIA, determine embolic source EXAM: CT ANGIOGRAPHY HEAD AND NECK WITH AND WITHOUT CONTRAST TECHNIQUE: Multidetector CT imaging of the head and neck was performed using the standard protocol during bolus administration of intravenous contrast. Multiplanar CT image reconstructions and MIPs were obtained to evaluate the vascular anatomy. Carotid stenosis measurements (when applicable) are obtained utilizing NASCET criteria, using the distal internal carotid diameter as the denominator. RADIATION DOSE REDUCTION: This exam was performed according to the departmental dose-optimization program which includes automated exposure control, adjustment of the mA and/or kV according to patient size and/or use of iterative reconstruction technique. CONTRAST:  75mL OMNIPAQUE  IOHEXOL  350 MG/ML SOLN COMPARISON:  None Available. FINDINGS: CT HEAD FINDINGS Brain: There is a mixed solid and cystic mass present within the left parietal  lobe, measuring approximately 4.6 x 3.0 x 4.0 cm. The solid component is relatively dense to cerebral cortex. There is mild surrounding edema, but there is no shift of the midline structures. There is generalized cerebral volume loss and mild to moderate diffuse cerebral white matter disease. Vascular: Moderate calcific atheromatous disease. Skull: No osseous lesions. Sinuses/Orbits: Moderate mucosal disease within the frontal, ethmoid and maxillary sinuses. Status post left lens replacement. Other: None. Review of the MIP images confirms the above findings CTA NECK FINDINGS Aortic arch: Mild calcific atheromatous disease. Right carotid system: The common carotid artery is normal in caliber. There is minimal calcific plaque within the carotid bulb and origin of the internal carotid artery, but no luminal stenosis. Left carotid system: There is calcific and noncalcific plaque present within the proximal internal carotid artery with approximately 50% luminal stenosis. The remainder of the cervical segment is normal in caliber. Vertebral arteries:  The vertebral arteries are codominant and normal in caliber. Skeleton: Mild multilevel degenerative disc disease. Other neck: None. Upper chest: There are some streaky opacities present within the right lung apex. There some hazy dependent opacities within the upper lobes bilaterally. There is a calcified granuloma also present posterior medially within the left upper lobe. Review of the MIP images confirms the above findings CTA HEAD FINDINGS Anterior circulation: There is mild calcific atheromatous disease within the carotid siphons but no significant stenosis. The anterior and middle cerebral arteries are normal in caliber and unremarkable in appearance. Posterior circulation: The vertebrobasilar system is unremarkable. There is fetal origin of the left posterior cerebral artery. The posterior cerebral arteries and cerebellar arteries are normal in caliber. Venous sinuses:  Widely patent. Anatomic variants: Fetal origin of the left posterior cerebral artery. Review of the MIP images confirms the above findings IMPRESSION: 1. 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 2. Moderate calcific and noncalcific plaque within the proximal left internal carotid artery, with estimated 50% luminal stenosis. These results were called by telephone at the time of interpretation on 08/11/2023 at 2:31 pm to provider Dr. CARON SALT , who verbally acknowledged these results. Electronically Signed   By: Evalene Coho M.D.   On: 08/11/2023 14:37   MR BRAIN W WO CONTRAST Result Date: 08/11/2023 CLINICAL DATA:  Provided history: Neuro deficit, acute, stroke suspected. EXAM: MRI HEAD WITHOUT AND WITH CONTRAST TECHNIQUE: Multiplanar, multiecho pulse sequences of the brain and surrounding structures were obtained without and with intravenous contrast. CONTRAST:  8mL GADAVIST  GADOBUTROL  1 MMOL/ML IV SOLN COMPARISON:  None. FINDINGS: Brain: Generalized cerebral atrophy. 4.3 x 3.1 x 4.8 cm mass at the junction of the frontal and parietal lobes on the left (with involvement of the perirolandic region). The mass has peripheral enhancement and appears centrally necrotic. The mass extends to abut, and may involve, the overlying dura (for instance as seen on series 11, image 10). Susceptibility-weighted signal loss associated with this lesion (greatest superiorly), likely reflecting hemosiderin deposition. Moderate surrounding vasogenic edema. Local mass effect with partial effacement of the posterior body and atrium of the left lateral ventricle. No midline shift. Additional 5 mm enhancing lesion at the junction of the posterior hippocampus and crus of fornix on the left (series 10, image 39) (for instance as seen on series 11, image 13). Subtle edema also present at this site (series 6, image 14). Background mild multifocal T2 FLAIR hyperintense signal abnormality within the  cerebral white matter, nonspecific but compatible chronic small vessel ischemic disease. Several nonspecific chronic microhemorrhages scattered within the supratentorial and infratentorial brain. There is no acute infarct. No extra-axial fluid collection. No midline shift. Vascular: Maintained flow voids within the proximal large arterial vessels. Skull and upper cervical spine: No focal worrisome marrow lesion. Sinuses/Orbits: No mass or acute finding within the imaged orbits. Prior left ocular lens replacement. Moderate right ethmoid sinusitis. 20 mm mucous retention cyst, and minimal background mucosal thickening, within the left maxillary sinus. Mild bilateral ethmoid sinusitis. Mild mucosal thickening within the right frontal sinus. Severe left frontal sinusitis (with complete sinus opacification). IMPRESSION: 1. 4.3 x 4.8 cm peripherally enhancing and centrally necrotic mass at the junction of the frontal and parietal lobes on the left, as described. Moderate surrounding vasogenic edema. Local mass effect with partial effacement of the left lateral ventricle. No midline shift. 2. Additional 5 mm enhancing lesion at the junction of the posterior hippocampus and crus of fornix on  the left. 3. The above described lesions are most suspicious for intracranial metastases. However, a multicentric high-grade primary CNS neoplasm (such as glioblastoma multiforme) could also have this imaging presentation. 4. Several nonspecific chronic microhemorrhages scattered within the supratentorial and infratentorial brain. 5. Background parenchymal atrophy and chronic small vessel ischemic disease. 6. Paranasal sinus disease as described. Electronically Signed   By: Rockey Childs D.O.   On: 08/11/2023 14:24     Procedures   Medications Ordered in the ED  dexamethasone  (DECADRON ) tablet 4 mg (has no administration in time range)    Or  dexamethasone  (DECADRON ) injection 4 mg (has no administration in time range)   gadobutrol  (GADAVIST ) 1 MMOL/ML injection 8 mL (8 mLs Intravenous Contrast Given 08/11/23 1316)  iohexol  (OMNIPAQUE ) 350 MG/ML injection 75 mL (75 mLs Intravenous Contrast Given 08/11/23 1411)  dexamethasone  (DECADRON ) injection 10 mg (10 mg Intravenous Given 08/11/23 1537)  iohexol  (OMNIPAQUE ) 350 MG/ML injection 75 mL (75 mLs Intravenous Contrast Given 08/11/23 1546)    Clinical Course as of 08/11/23 1548  Mon Aug 11, 2023  1430 MR BRAIN W WO CONTRAST MPRESSION: 1. 4.3 x 4.8 cm peripherally enhancing and centrally necrotic mass at the junction of the frontal and parietal lobes on the left, as described. Moderate surrounding vasogenic edema. Local mass effect with partial effacement of the left lateral ventricle. No midline shift. 2. Additional 5 mm enhancing lesion at the junction of the posterior hippocampus and crus of fornix on the left. 3. The above described lesions are most suspicious for intracranial metastases. However, a multicentric high-grade primary CNS neoplasm (such as glioblastoma multiforme) could also have this imaging presentation. 4. Several nonspecific chronic microhemorrhages scattered within the supratentorial and infratentorial brain. 5. Background parenchymal atrophy and chronic small vessel ischemic disease. 6. Paranasal sinus disease as described.   Electronically Signed   By: Rockey Childs D.O.   On: 08/11/2023 14:24   [TY]  1545 Spoke with Dr. Tobie, hospitalist who agrees to admit patient. [TY]    Clinical Course User Index [TY] Neysa Caron PARAS, DO                                 Medical Decision Making This is a 78 year old male presenting emergency department with generalized weakness.  He is afebrile nontachycardic is hypertensive.  Physical exam concerning with weakness to right lower extremity and some intermittent paresthesias as well.  Some of his weakness is seemingly subacute/chronic, but more focal findings were concerning for acute  intracranial pathology.  He is outside of the window for acute stroke intervention or activation.  MRI with brain mass with some vasogenic edema.  Given Decadron .  Neurosurgery consulted.  CTA without LVO.  Workup with no significant metabolic derangements.  Normal kidney function.  No transaminitis to suggest hepatobiliary disease.  No fever, tachycardia or leukocytosis to suggest systemic infection.  Minor anemia.  Case discussed with neurosurgery, recommending scheduled Decadron  as well as CT chest abdomen and pelvis to evaluate for metastatic process.  Given patient's acute weakness which is interfering with his ADLs in conjunction with his newly found brain mass will admit for further management.  Amount and/or Complexity of Data Reviewed External Data Reviewed:     Details: Does not appear to have prior imaging of head. Labs: ordered. Decision-making details documented in ED Course. Radiology: ordered. Decision-making details documented in ED Course. ECG/medicine tests: ordered and independent interpretation performed.  Details: Sinus rhythm without ST segment changes to indicate ischemia. Discussion of management or test interpretation with external provider(s): NSGY and hospitalist.   Risk Prescription drug management. Decision regarding hospitalization. Diagnosis or treatment significantly limited by social determinants of health. Risk Details: Poor health literacy      Final diagnoses:  None    ED Discharge Orders     None          Neysa Caron PARAS, DO 08/11/23 1548

## 2023-08-11 NOTE — H&P (Addendum)
 History and Physical    Patient: Noah King FMW:[id number] DOB: July 27, 1945 DOA: 08/11/2023 DOS: the patient was seen and examined on 08/11/2023 . PCP: Merna Huxley, NP  Patient coming from: Home Chief complaint: Chief Complaint  Patient presents with   Extremity Weakness   HPI:  Noah King is a 78 y.o. male with past medical history  of history of tobacco abuse, hypothyroidism, essential hypertension, polycythemia vera, history of herpes zoster, history of gall abuse with no withdrawal history or shakes presenting today for generalized weakness difficulty with his gait and walking no falls but weakness he noticed more on the right side no difficulty swallowing vision chest pain palpitations incontinence numbness paresthesia or any other symptoms otherwise.  Patient is in no distress is thin appearing, livedo on lower extremities.  ED Course:  Vital signs in the ED were notable for the following:  Vitals:   08/11/23 1413 08/11/23 1630 08/11/23 1751 08/11/23 1759  BP: (!) 148/68 (!) 150/101 (!) 167/99 (!) 159/114  Pulse: 67 68 84 74  Temp: 97.7 F (36.5 C)   98.5 F (36.9 C)  Resp: 18  14 14   SpO2: 99%  100% 100%  TempSrc: Oral   Oral  >>ED evaluation thus far shows: CMP shows glucose 113 total bili 1.5 otherwise normal electrolytes normal kidney function normal LFTs. Lipid panel within normal limits. CBC showing normal white count normal platelets hemoglobin 12.8. Urinalysis within normal limits specific gravity of more than 1.046 and ketones of 20. CT angio head and neck shows a complex solid and cystic neoplasm in the left parietal lobe please see complete report MRI of the brain shows 4.3 x 4.8 cm necrotic mass at the junction of the frontal and parietal lobe on the left with vasogenic edema local mass effect suspicious for an intracranial mass including glioblastoma multiforme. EKG shows sinus rhythm at 65 PR 194 QTc 439 nonspecific ST depression in lateral  leads.  >>While in the ED patient received the following: Medications  dexamethasone  (DECADRON ) tablet 4 mg (has no administration in time range)    Or  dexamethasone  (DECADRON ) injection 4 mg (has no administration in time range)  LORazepam  (ATIVAN ) tablet 1-4 mg (has no administration in time range)    Or  LORazepam  (ATIVAN ) injection 1-4 mg (has no administration in time range)  thiamine  (VITAMIN B1) tablet 100 mg (has no administration in time range)    Or  thiamine  (VITAMIN B1) injection 100 mg (has no administration in time range)  folic acid  (FOLVITE ) tablet 1 mg (has no administration in time range)  multivitamin with minerals tablet 1 tablet (has no administration in time range)  gadobutrol  (GADAVIST ) 1 MMOL/ML injection 8 mL (8 mLs Intravenous Contrast Given 08/11/23 1316)  iohexol  (OMNIPAQUE ) 350 MG/ML injection 75 mL (75 mLs Intravenous Contrast Given 08/11/23 1411)  dexamethasone  (DECADRON ) injection 10 mg (10 mg Intravenous Given 08/11/23 1537)  iohexol  (OMNIPAQUE ) 350 MG/ML injection 75 mL (75 mLs Intravenous Contrast Given 08/11/23 1546)   Review of Systems  Neurological:  Positive for weakness.  All other systems reviewed and are negative.  Past Medical History:  Diagnosis Date   Allergy    Cataract    left  extraction- right eye forming   Dupuytren's contracture    GERD (gastroesophageal reflux disease)    Hiatal hernia    past hx-  suspect at one time   Hypertension    Polycythemia    Past Surgical History:  Procedure Laterality Date   cataract  extraction left eye     COLONOSCOPY     DUPUYTREN CONTRACTURE RELEASE Left    POLYPECTOMY     rt knee arthroscopic      reports that he quit smoking about 38 years ago. His smoking use included cigarettes. He has never used smokeless tobacco. He reports current alcohol use of about 20.0 standard drinks of alcohol per week. He reports that he does not use drugs. Allergies  Allergen Reactions   Lotensin  [Benazepril   Hcl] Cough   Family History  Problem Relation Age of Onset   Stroke Mother    Colon cancer Father    Breast cancer Sister    Cancer Sister        breast    Colon polyps Neg Hx    Esophageal cancer Neg Hx    Prior to Admission medications   Medication Sig Start Date End Date Taking? Authorizing Provider  aspirin 81 MG tablet Take 81 mg by mouth daily.   Yes [provider]  atorvastatin  (LIPITOR) 20 MG tablet Take 1 tablet (20 mg total) by mouth daily. 12/17/22  Yes Nafziger, Darleene, NP  docusate sodium  (COLACE) 100 MG capsule Take 100 mg by mouth daily as needed (constipation).   Yes [provider]  losartan  (COZAAR ) 25 MG tablet Take 1 tablet (25 mg total) by mouth daily. 12/17/22  Yes Nafziger, Darleene, NP  Multiple Vitamins-Minerals (MENS 50+ MULTIVITAMIN) TABS Take 1 tablet by mouth daily.   Yes [provider]  omeprazole  (PRILOSEC) 20 MG capsule TAKE 1 CAPSULE BY MOUTH DAILY 12/10/22  Yes Nafziger, Darleene, NP  sildenafil  (REVATIO ) 20 MG tablet Take 1 tablet (20 mg total) by mouth daily as needed. 12/12/20  Yes Nafziger, Darleene, NP  verapamil  (CALAN -SR) 180 MG CR tablet TAKE ONE TABLET BY MOUTH EVERY MORNING 12/10/22  Yes Nafziger, Darleene, NP                                                                                 Vitals:   08/11/23 1413 08/11/23 1630 08/11/23 1751 08/11/23 1759  BP: (!) 148/68 (!) 150/101 (!) 167/99 (!) 159/114  Pulse: 67 68 84 74  Resp: 18  14 14   Temp: 97.7 F (36.5 C)   98.5 F (36.9 C)  TempSrc: Oral   Oral  SpO2: 99%  100% 100%   Physical Exam Vitals reviewed.  Constitutional:      General: He is not in acute distress.    Appearance: He is underweight. He is not ill-appearing.  HENT:     Head: Normocephalic.     Right Ear: Hearing normal.     Left Ear: Hearing normal.     Mouth/Throat:     Lips: Pink.     Tongue: Tongue does not deviate from midline.  Eyes:     General: Lids are normal.     Extraocular Movements:  Extraocular movements intact.     Pupils: Pupils are equal, round, and reactive to light.  Cardiovascular:     Rate and Rhythm: Normal rate and regular rhythm.     Pulses:          Dorsalis pedis pulses are 2+ on the right  side and 2+ on the left side.       Posterior tibial pulses are 2+ on the right side and 2+ on the left side.     Heart sounds: Normal heart sounds.  Pulmonary:     Breath sounds: Examination of the right-upper field reveals rales. Examination of the left-upper field reveals rales. Examination of the right-middle field reveals rales. Examination of the left-middle field reveals rales. Examination of the right-lower field reveals rales. Examination of the left-lower field reveals rales. Rales present.  Abdominal:     General: There is no distension.     Palpations: Abdomen is soft.     Tenderness: There is no abdominal tenderness.  Skin:    Comments: Livedo reticularis.   Neurological:     General: No focal deficit present.     Mental Status: He is alert and oriented to person, place, and time.     Cranial Nerves: Cranial nerves 2-12 are intact. No cranial nerve deficit or facial asymmetry.     Motor: Weakness present.     Coordination: Coordination normal. Finger-Nose-Finger Test normal.     Deep Tendon Reflexes:     Reflex Scores:      Bicep reflexes are 0 on the right side and 2+ on the left side.      Patellar reflexes are 0 on the right side and 2+ on the left side. Psychiatric:        Attention and Perception: Attention normal.        Mood and Affect: Mood normal.        Speech: Speech normal.        Behavior: Behavior normal. Behavior is cooperative.     Labs on Admission: I have personally reviewed following labs and imaging studies CBC: Recent Labs  Lab 08/11/23 1155  WBC 4.9  NEUTROABS 3.0  HGB 12.8*  HCT 38.5*  MCV 83.2  PLT 205   Basic Metabolic Panel: Recent Labs  Lab 08/11/23 1155  NA 136  K 3.8  CL 104  CO2 23  GLUCOSE 113*  BUN  7*  CREATININE 0.79  CALCIUM  8.6*   GFR: CrCl cannot be calculated (Unknown ideal weight.). Liver Function Tests: Recent Labs  Lab 08/11/23 1155  AST 17  ALT 15  ALKPHOS 56  BILITOT 1.5*  PROT 6.5  ALBUMIN 3.5   No results for input(s): LIPASE, AMYLASE in the last 168 hours. No results for input(s): AMMONIA in the last 168 hours. Coagulation Profile: Recent Labs  Lab 08/11/23 1538  INR 1.1   Cardiac Enzymes: No results for input(s): CKTOTAL, CKMB, CKMBINDEX, TROPONINI in the last 168 hours. BNP (last 3 results) No results for input(s): PROBNP in the last 8760 hours. HbA1C: Recent Labs    08/11/23 1155  HGBA1C 4.7*   CBG: No results for input(s): GLUCAP in the last 168 hours. Lipid Profile: Recent Labs    08/11/23 1155  CHOL 105  HDL 61  LDLCALC 36  TRIG 39  CHOLHDL 1.7   Thyroid  Function Tests: No results for input(s): TSH, T4TOTAL, FREET4, T3FREE, THYROIDAB in the last 72 hours. Anemia Panel: No results for input(s): VITAMINB12, FOLATE, FERRITIN, TIBC, IRON, RETICCTPCT in the last 72 hours. Urine analysis:    Component Value Date/Time   COLORURINE YELLOW 08/11/2023 1155   APPEARANCEUR CLEAR 08/11/2023 1155   LABSPEC >1.046 (H) 08/11/2023 1155   PHURINE 7.0 08/11/2023 1155   GLUCOSEU NEGATIVE 08/11/2023 1155   HGBUR NEGATIVE 08/11/2023 1155   HGBUR negative  05/18/2009 0753   BILIRUBINUR NEGATIVE 08/11/2023 1155   BILIRUBINUR 1+ 11/09/2015 1136   KETONESUR 20 (A) 08/11/2023 1155   PROTEINUR NEGATIVE 08/11/2023 1155   UROBILINOGEN 1.0 11/09/2015 1136   UROBILINOGEN 0.2 05/18/2009 0753   NITRITE NEGATIVE 08/11/2023 1155   LEUKOCYTESUR NEGATIVE 08/11/2023 1155   Radiological Exams on Admission: CT CHEST ABDOMEN PELVIS W CONTRAST Result Date: 08/11/2023 CLINICAL DATA:  Brain/CNS neoplasm, staging. Increased weakness. * Tracking Code: BO * EXAM: CT CHEST, ABDOMEN, AND PELVIS WITH CONTRAST TECHNIQUE:  Multidetector CT imaging of the chest, abdomen and pelvis was performed following the standard protocol during bolus administration of intravenous contrast. RADIATION DOSE REDUCTION: This exam was performed according to the departmental dose-optimization program which includes automated exposure control, adjustment of the mA and/or kV according to patient size and/or use of iterative reconstruction technique. CONTRAST:  75mL OMNIPAQUE  IOHEXOL  350 MG/ML SOLN COMPARISON:  None Available. FINDINGS: CT CHEST FINDINGS Cardiovascular: Normal cardiac size. No pericardial effusion. No aortic aneurysm. There are coronary artery calcifications, in keeping with coronary artery disease. There are also mild peripheral atherosclerotic vascular calcifications of thoracic aorta and its major branches. Mediastinum/Nodes: Visualized thyroid  gland appears grossly unremarkable. No solid / cystic mediastinal masses. The esophagus is nondistended precluding optimal assessment. There are few mildly prominent mediastinal and hilar lymph nodes, which are indeterminate in etiology. No axillary lymphadenopathy by size criteria. Lungs/Pleura: The central tracheo-bronchial tree is patent. Linear irregular area of scarring noted in the right lung apex. There are additional patchy areas of linear, plate-like atelectasis and/or scarring throughout bilateral lungs. No mass or consolidation. No pleural effusion or pneumothorax. There multiple calcified and noncalcified nodules throughout bilateral lungs (marked with electronic arrow sign on series 2006), with largest in the right lung lower lobe measuring 7 x 7 mm. In the absence of prior imaging and in this patient with history of malignancy, these are suspicious. Short-term follow-up examination in 6-12 weeks is recommended to evaluate for the growth pattern. Musculoskeletal: The visualized soft tissues of the chest wall are grossly unremarkable. No suspicious osseous lesions. There are mild  multilevel degenerative changes in the visualized spine. CT ABDOMEN PELVIS FINDINGS Hepatobiliary: The liver is normal in size. Non-cirrhotic configuration. No suspicious mass. No intrahepatic or extrahepatic bile duct dilation. No calcified gallstones. Normal gallbladder wall thickness. No pericholecystic inflammatory changes. Pancreas: Unremarkable. No pancreatic ductal dilatation or surrounding inflammatory changes. Spleen: Within normal limits. No focal lesion. Adrenals/Urinary Tract: Adrenal glands are unremarkable. No suspicious renal mass. There are multiple scattered simple cysts throughout bilateral kidneys with largest arising from the right kidney lower pole measuring up to 2.7 x 3.2 cm. No obstructive uropathy on either side. Evaluation for nonobstructing renal calculi is limited due to excreted contrast in the collecting system. Unremarkable urinary bladder. Stomach/Bowel: There is a small sliding hiatal hernia. No disproportionate dilation of the small or large bowel loops. The appendix was not visualized; however there is no acute inflammatory process in the right lower quadrant. There are several loops of small bowel in the left abdomen and right lower quadrant (marked with electronic arrow sign on series 5) exhibiting mild-to-moderate circumferential wall thickening with perienteric fat stranding and prominence of fossa recta, compatible with enteritis, most likely infective/inflammatory in etiology. Vascular/Lymphatic: No ascites or pneumoperitoneum. No abdominal or pelvic lymphadenopathy, by size criteria. No aneurysmal dilation of the major abdominal arteries. There are mild peripheral atherosclerotic vascular calcifications of the aorta and its major branches. Reproductive: Normal size prostate. Symmetric seminal vesicles. Other: There  are fat containing umbilical and bilateral inguinal hernias. The soft tissues and abdominal wall are otherwise unremarkable. Musculoskeletal: No suspicious  osseous lesions. There is a relatively well-defined 1.2 x 1.7 cm lytic lesion in the upper portion of S1 vertebrae, incompletely characterized on the current exam but favored benign/degenerative in etiology. There are mild multilevel degenerative changes in the visualized spine. IMPRESSION: 1. There are several loops of small bowel in the left abdomen and right lower quadrant exhibiting mild-to-moderate circumferential wall thickening with perienteric fat stranding and prominence of vasa recta, compatible with enteritis, most likely infective/inflammatory in etiology. 2. There are multiple calcified and noncalcified nodules throughout bilateral lungs, with largest in the right lung lower lobe measuring 7 x 7 mm. In the absence of prior imaging and in this patient with history of malignancy, these are suspicious. Short-term follow-up examination in 6-12 weeks is recommended to evaluate for the growth pattern. 3. No metastatic disease identified within the abdomen or pelvis. 4. Multiple other nonacute observations, as described above. Aortic Atherosclerosis (ICD10-I70.0). Electronically Signed   By: Ree Molt M.D.   On: 08/11/2023 16:26   DG Chest Portable 1 View Result Date: 08/11/2023 CLINICAL DATA:  Several day history of increased dizziness EXAM: PORTABLE CHEST 1 VIEW COMPARISON:  Chest radiograph dated 12/08/2019 FINDINGS: Normal lung volumes. No focal consolidations. Costophrenic angles are not entirely included within the field of view. No definite pleural effusion or pneumothorax. The heart size and mediastinal contours are within normal limits. No acute osseous abnormality. IMPRESSION: No acute disease. Electronically Signed   By: Limin  Xu M.D.   On: 08/11/2023 15:31   CT ANGIO HEAD NECK W WO CM Result Date: 08/11/2023 CLINICAL DATA:  Stroke/TIA, determine embolic source EXAM: CT ANGIOGRAPHY HEAD AND NECK WITH AND WITHOUT CONTRAST TECHNIQUE: Multidetector CT imaging of the head and neck was  performed using the standard protocol during bolus administration of intravenous contrast. Multiplanar CT image reconstructions and MIPs were obtained to evaluate the vascular anatomy. Carotid stenosis measurements (when applicable) are obtained utilizing NASCET criteria, using the distal internal carotid diameter as the denominator. RADIATION DOSE REDUCTION: This exam was performed according to the departmental dose-optimization program which includes automated exposure control, adjustment of the mA and/or kV according to patient size and/or use of iterative reconstruction technique. CONTRAST:  75mL OMNIPAQUE  IOHEXOL  350 MG/ML SOLN COMPARISON:  None Available. FINDINGS: CT HEAD FINDINGS Brain: There is a mixed solid and cystic mass present within the left parietal lobe, measuring approximately 4.6 x 3.0 x 4.0 cm. The solid component is relatively dense to cerebral cortex. There is mild surrounding edema, but there is no shift of the midline structures. There is generalized cerebral volume loss and mild to moderate diffuse cerebral white matter disease. Vascular: Moderate calcific atheromatous disease. Skull: No osseous lesions. Sinuses/Orbits: Moderate mucosal disease within the frontal, ethmoid and maxillary sinuses. Status post left lens replacement. Other: None. Review of the MIP images confirms the above findings CTA NECK FINDINGS Aortic arch: Mild calcific atheromatous disease. Right carotid system: The common carotid artery is normal in caliber. There is minimal calcific plaque within the carotid bulb and origin of the internal carotid artery, but no luminal stenosis. Left carotid system: There is calcific and noncalcific plaque present within the proximal internal carotid artery with approximately 50% luminal stenosis. The remainder of the cervical segment is normal in caliber. Vertebral arteries: The vertebral arteries are codominant and normal in caliber. Skeleton: Mild multilevel degenerative disc  disease. Other neck: None. Upper  chest: There are some streaky opacities present within the right lung apex. There some hazy dependent opacities within the upper lobes bilaterally. There is a calcified granuloma also present posterior medially within the left upper lobe. Review of the MIP images confirms the above findings CTA HEAD FINDINGS Anterior circulation: There is mild calcific atheromatous disease within the carotid siphons but no significant stenosis. The anterior and middle cerebral arteries are normal in caliber and unremarkable in appearance. Posterior circulation: The vertebrobasilar system is unremarkable. There is fetal origin of the left posterior cerebral artery. The posterior cerebral arteries and cerebellar arteries are normal in caliber. Venous sinuses: Widely patent. Anatomic variants: Fetal origin of the left posterior cerebral artery. Review of the MIP images confirms the above findings IMPRESSION: 1. 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 2. Moderate calcific and noncalcific plaque within the proximal left internal carotid artery, with estimated 50% luminal stenosis. These results were called by telephone at the time of interpretation on 08/11/2023 at 2:31 pm to provider Dr. CARON SALT , who verbally acknowledged these results. Electronically Signed   By: Evalene Coho M.D.   On: 08/11/2023 14:37   MR BRAIN W WO CONTRAST Result Date: 08/11/2023 CLINICAL DATA:  Provided history: Neuro deficit, acute, stroke suspected. EXAM: MRI HEAD WITHOUT AND WITH CONTRAST TECHNIQUE: Multiplanar, multiecho pulse sequences of the brain and surrounding structures were obtained without and with intravenous contrast. CONTRAST:  8mL GADAVIST  GADOBUTROL  1 MMOL/ML IV SOLN COMPARISON:  None. FINDINGS: Brain: Generalized cerebral atrophy. 4.3 x 3.1 x 4.8 cm mass at the junction of the frontal and parietal lobes on the left (with involvement of the perirolandic region). The  mass has peripheral enhancement and appears centrally necrotic. The mass extends to abut, and may involve, the overlying dura (for instance as seen on series 11, image 10). Susceptibility-weighted signal loss associated with this lesion (greatest superiorly), likely reflecting hemosiderin deposition. Moderate surrounding vasogenic edema. Local mass effect with partial effacement of the posterior body and atrium of the left lateral ventricle. No midline shift. Additional 5 mm enhancing lesion at the junction of the posterior hippocampus and crus of fornix on the left (series 10, image 39) (for instance as seen on series 11, image 13). Subtle edema also present at this site (series 6, image 14). Background mild multifocal T2 FLAIR hyperintense signal abnormality within the cerebral white matter, nonspecific but compatible chronic small vessel ischemic disease. Several nonspecific chronic microhemorrhages scattered within the supratentorial and infratentorial brain. There is no acute infarct. No extra-axial fluid collection. No midline shift. Vascular: Maintained flow voids within the proximal large arterial vessels. Skull and upper cervical spine: No focal worrisome marrow lesion. Sinuses/Orbits: No mass or acute finding within the imaged orbits. Prior left ocular lens replacement. Moderate right ethmoid sinusitis. 20 mm mucous retention cyst, and minimal background mucosal thickening, within the left maxillary sinus. Mild bilateral ethmoid sinusitis. Mild mucosal thickening within the right frontal sinus. Severe left frontal sinusitis (with complete sinus opacification). IMPRESSION: 1. 4.3 x 4.8 cm peripherally enhancing and centrally necrotic mass at the junction of the frontal and parietal lobes on the left, as described. Moderate surrounding vasogenic edema. Local mass effect with partial effacement of the left lateral ventricle. No midline shift. 2. Additional 5 mm enhancing lesion at the junction of the  posterior hippocampus and crus of fornix on the left. 3. The above described lesions are most suspicious for intracranial metastases. However, a multicentric high-grade primary CNS  neoplasm (such as glioblastoma multiforme) could also have this imaging presentation. 4. Several nonspecific chronic microhemorrhages scattered within the supratentorial and infratentorial brain. 5. Background parenchymal atrophy and chronic small vessel ischemic disease. 6. Paranasal sinus disease as described. Electronically Signed   By: Rockey Childs D.O.   On: 08/11/2023 14:24   Data Reviewed: Relevant notes from primary care and specialist visits, past discharge summaries as available in EHR, including Care Everywhere . Prior diagnostic testing as pertinent to current admission diagnoses, Updated medications and problem lists for reconciliation .ED course, including vitals, labs, imaging, treatment and response to treatment,Triage notes, nursing and pharmacy notes and ED provider's notes.Notable results as noted in HPI.Discussed case with EDMD/ ED APP/ or Specialty MD on call and as needed.  Assessment & Plan  >>Right sided weakness/new brain mass: 2/2 abnormal findings of frontoparietal mass and left hippocampal focus concerning for metastatic disease.   Neurosurgery consulted on board and patient started on Decadron  4 mg every 6 hours with every 4 neurochecks. Aspiration and fall precautions, ambulate with assistance. Heparin 5000 subcu every 12 for DVT prophylaxis. Oncology consult as deemed appropriate per a.m. team.   >> Alcohol abuse: Patient drinks beer on a daily basis, no history of withdrawal symptoms. CIWA protocol and thiamine .   >> Essential hypertension Vitals:   08/11/23 1115 08/11/23 1200 08/11/23 1413 08/11/23 1630  BP: (!) 157/68 (!) 148/86 (!) 148/68 (!) 150/101   08/11/23 1751 08/11/23 1759  BP: (!) 167/99 (!) 159/114  Patient is currently on verapamil  180 mg, losartan  25 mg we will  continue him on verapamil .     DVT prophylaxis:  Heparin every 12 Consults:  Neurosurgery  Advance Care Planning:    Code Status: Not on file   Family Communication:  None Disposition Plan:  Home Severity of Illness: The appropriate patient status for this patient is INPATIENT. Inpatient status is judged to be reasonable and necessary in order to provide the required intensity of service to ensure the patient's safety. The patient's presenting symptoms, physical exam findings, and initial radiographic and laboratory data in the context of their chronic comorbidities is felt to place them at high risk for further clinical deterioration. Furthermore, it is not anticipated that the patient will be medically stable for discharge from the hospital within 2 midnights of admission.   * I certify that at the point of admission it is my clinical judgment that the patient will require inpatient hospital care spanning beyond 2 midnights from the point of admission due to high intensity of service, high risk for further deterioration and high frequency of surveillance required.*  Unresulted Labs (From admission, onward)     Start     Ordered   08/12/23 0500  Lipid panel  (Labs)  Tomorrow morning,   R       Comments: Fasting    08/11/23 1904            Meds ordered this encounter  Medications   gadobutrol  (GADAVIST ) 1 MMOL/ML injection 8 mL   iohexol  (OMNIPAQUE ) 350 MG/ML injection 75 mL   dexamethasone  (DECADRON ) injection 10 mg   OR Linked Order Group    dexamethasone  (DECADRON ) tablet 4 mg    dexamethasone  (DECADRON ) injection 4 mg   iohexol  (OMNIPAQUE ) 350 MG/ML injection 75 mL   OR Linked Order Group    LORazepam  (ATIVAN ) tablet 1-4 mg     CIWA-AR < 5 =:   0 mg     CIWA-AR 5 -10 =:  1 mg     CIWA-AR 11 -15 =:   2 mg     CIWA-AR 16 -20 =:   3 mg     CIWA-AR 16 -20 =:   Recheck CIWA-AR in 1 hour; if > 20 notify MD     CIWA-AR > 20 =:   4 mg     CIWA-AR > 20 =:   Call Rapid  Response    LORazepam  (ATIVAN ) injection 1-4 mg     CIWA-AR < 5 =:   0 mg     CIWA-AR 5 -10 =:   1 mg     CIWA-AR 11 -15 =:   2 mg     CIWA-AR 16 -20 =:   3 mg     CIWA-AR 16 -20 =:   Recheck CIWA-AR in 1 hour; if > 20 notify MD     CIWA-AR > 20 =:   4 mg     CIWA-AR > 20 =:   Call Rapid Response   OR Linked Order Group    thiamine  (VITAMIN B1) tablet 100 mg    thiamine  (VITAMIN B1) injection 100 mg   folic acid  (FOLVITE ) tablet 1 mg   multivitamin with minerals tablet 1 tablet    stroke: early stages of recovery book     Orders Placed This Encounter  Procedures   DG Chest Portable 1 View   CT ANGIO HEAD NECK W WO CM   MR BRAIN W WO CONTRAST   CT CHEST ABDOMEN PELVIS W CONTRAST   CBC with Differential   Comprehensive metabolic panel   Urinalysis, Routine w reflex microscopic -Urine, Clean Catch   Protime-INR   APTT   Ethanol   Lipid panel   Hemoglobin A1c   Lipid panel   Diet regular Room service appropriate? Yes; Fluid consistency: Thin   Vital signs   ED Cardiac monitoring   NIH Stroke Scale   Swallow screen   Initiate Carrier Fluid Protocol   If O2 sat <94% Administer O2 @ 2 Liters/Minute If O2 Sat < 94%, administer O2 at 2 liters/minute via nasal cannula.   Neuro checks   Clinical institute withdrawal assessment   Vital signs every 6 hours X 48 hours, then per unit protocol   Refer to Sidebar Report for reference: ETOH Withdrawal Guidelines   Clinical Institute Withdrawal Assessent (CIWA)   If Ativan  given, reassess Clinical Institute Withdrawal Assessment (CIWA) with blood pressure and pulse rate within 1 hour of Ativan  administration   Notify Pharmacy to change IV Ativan  to PO if tolerating POs well.   Notify physician (specify)   NIHSS score documentation NIHSS score range: 0-42   Vital signs   Notify physician (specify)   Swallow screen - If patient does NOT pass this screen, place order for SLP eval and treat (SLP2) - swallowing evaluation (BSE, MBS and/or  diet order as indicated)   RN to notify Physician for appropriate diet after patient passes swallow screen   NIH Stroke Scale   Intake and output   Cardiac Monitoring - Continuous Indefinite   Initiate Adult Central Line Maintenance and Catheter Protocol for patients with central line (CVC, PICC, Port, Hemodialysis, Trialysis)   Initiate Oral Care Protocol   Initiate Carrier Fluid Protocol   NIHSS score documentation NIHSS score range: 0-42   Apply Stroke Care Plan: Ischemic Stroke, TIA   Discuss with patient and document patient's goals for stroke risk factor reduction   Provide stroke education material to patient and  family   Nurse to provide smoking / tobacco cessation education   Vital signs   Consult to neurosurgery   Consult for Unassigned Medical Admission   Consult to Transition of Care Team   OT eval and treat   PT eval and treat   ED Pulse oximetry, continuous   Oxygen therapy Mode or (Route): Nasal cannula; Liters Per Minute: 2; Keep O2 saturation between: greater than 94 %   SLP eval and treat Reason for evaluation: Cognitive/Language evaluation   EKG 12-Lead   EKG 12-Lead   ED EKG   Saline lock IV   Admit to Inpatient (patient's expected length of stay will be greater than 2 midnights or inpatient only procedure)   Fall precautions   Aspiration precautions   Seizure precautions    Author: Mario LULLA Blanch, MD 12 pm- 8 pm. [facility]. 08/11/2023 7:04 PM Please note for any communication after hours contact [facility] Assigned provider on call on Amion.

## 2023-08-11 NOTE — Evaluation (Signed)
 Physical Therapy Evaluation Patient Details Name: Noah King MRN: [id number] DOB: 1945/07/26 Today's Date: 08/11/2023  History of Present Illness  78 y.o. male presents to [facility] hospital on 08/11/2023 with R hemiparesis and numbness. 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 PMH includes HTN.  Clinical Impression  Pt presents to PT with deficits in functional mobility, gait, balance, coordination, sensation. Pt has bilateral dorsiflexor weakness along with reports of numbness to both feet, resulting in foot drop when ambulating which is more significant on the R side. Pt often kicks RW when attempting to advance RLE, increasing his risk for falls. Pt was independent and driving prior to the last month and since then has fallen multiple times due to LE weakness and sensation deficits. Patient will benefit from intensive inpatient follow-up therapy, >3 hours/day.        If plan is discharge home, recommend the following: A little help with walking and/or transfers;A little help with bathing/dressing/bathroom;Assistance with cooking/housework;Assist for transportation;Help with stairs or ramp for entrance   Can travel by private vehicle        Equipment Recommendations None recommended by PT  Recommendations for Other Services  Rehab consult    Functional Status Assessment Patient has had a recent decline in their functional status and demonstrates the ability to make significant improvements in function in a reasonable and predictable amount of time.     Precautions / Restrictions Precautions Precautions: Fall Restrictions Weight Bearing Restrictions Per Provider Order: No      Mobility  Bed Mobility Overal bed mobility: Needs Assistance Bed Mobility: Supine to Sit, Sit to Supine     Supine to sit: Supervision Sit to supine: Min assist   General bed mobility comments: assist for RLE    Transfers Overall transfer level: Needs  assistance Equipment used: Rolling walker (2 wheels) Transfers: Sit to/from Stand Sit to Stand: Contact guard assist                Ambulation/Gait Ambulation/Gait assistance: Contact guard assist Gait Distance (Feet): 70 Feet Assistive device: Rolling walker (2 wheels) Gait Pattern/deviations: Step-through pattern, Decreased dorsiflexion - right, Decreased dorsiflexion - left Gait velocity: reduced Gait velocity interpretation: <1.8 ft/sec, indicate of risk for recurrent falls   General Gait Details: pt with significant R foot drop which he compensates for with increased R hip and knee flexion. Pt's R foot often bumps into RW when initiating a step, this becomes less consistent with increased gait distance  Stairs            Wheelchair Mobility     Tilt Bed    Modified Rankin (Stroke Patients Only)       Balance Overall balance assessment: Needs assistance Sitting-balance support: No upper extremity supported, Feet supported Sitting balance-Leahy Scale: Good     Standing balance support: Bilateral upper extremity supported, Reliant on assistive device for balance Standing balance-Leahy Scale: Poor                               Pertinent Vitals/Pain Pain Assessment Pain Assessment: No/denies pain    Home Living Family/patient expects to be discharged to:: Private residence Living Arrangements: Alone Available Help at Discharge: Friend(s);Available PRN/intermittently Type of Home: House Home Access: Stairs to enter Entrance Stairs-Rails: None Entrance Stairs-Number of Steps: 2   Home Layout: One level Home Equipment: Rollator (4 wheels);Cane - single point;Shower seat  Prior Function Prior Level of Function : Independent/Modified Independent;Driving (stopped driving recently due to foot drop)             Mobility Comments: pt has been primarily ambulating with a rollator for the last month due to LE weakness, prior was  independent       Extremity/Trunk Assessment   Upper Extremity Assessment Upper Extremity Assessment: RUE deficits/detail RUE Deficits / Details: ROM WFL, grossly 4+/5    Lower Extremity Assessment Lower Extremity Assessment: RLE deficits/detail;LLE deficits/detail RLE Deficits / Details: grossly 4+/5 aside from DF strength which appears to be 2-/5 RLE Sensation: decreased light touch LLE Deficits / Details: grossly 4+/5 aside from DF strength which appears to be 2-/5 LLE Sensation: decreased light touch    Cervical / Trunk Assessment Cervical / Trunk Assessment: Normal  Communication   Communication Communication: No apparent difficulties    Cognition Arousal: Alert Behavior During Therapy: WFL for tasks assessed/performed   PT - Cognitive impairments: No apparent impairments                         Following commands: Intact       Cueing Cueing Techniques: Verbal cues     General Comments General comments (skin integrity, edema, etc.): VSS on RA    Exercises     Assessment/Plan    PT Assessment Patient needs continued PT services  PT Problem List Decreased strength;Decreased activity tolerance;Decreased balance;Decreased coordination;Decreased mobility;Decreased knowledge of use of DME;Impaired sensation       PT Treatment Interventions DME instruction;Gait training;Stair training;Functional mobility training;Therapeutic activities;Therapeutic exercise;Balance training;Neuromuscular re-education;Cognitive remediation;Patient/family education    PT Goals (Current goals can be found in the Care Plan section)  Acute Rehab PT Goals Patient Stated Goal: to return to independence PT Goal Formulation: With patient Time For Goal Achievement: 08/25/23 Potential to Achieve Goals: Fair Additional Goals Additional Goal #1: Pt will score >19/24 on the DGI to indicate a reduced risk for falls Additional Goal #2: Pt will score >45/56 on the BERG balance test  to indicate a reduced risk for falls    Frequency Min 2X/week     Co-evaluation               AM-PAC PT 6 Clicks Mobility  Outcome Measure Help needed turning from your back to your side while in a flat bed without using bedrails?: A Little Help needed moving from lying on your back to sitting on the side of a flat bed without using bedrails?: A Little Help needed moving to and from a bed to a chair (including a wheelchair)?: A Little Help needed standing up from a chair using your arms (e.g., wheelchair or bedside chair)?: A Little Help needed to walk in hospital room?: A Little Help needed climbing 3-5 steps with a railing? : A Little 6 Click Score: 18    End of Session Equipment Utilized During Treatment: Gait belt Activity Tolerance: Patient tolerated treatment well Patient left: in bed;with call bell/phone within reach Nurse Communication: Mobility status PT Visit Diagnosis: Other abnormalities of gait and mobility (R26.89);Muscle weakness (generalized) (M62.81);Other symptoms and signs involving the nervous system (R29.898)    Time: 8282-8254 PT Time Calculation (min) (ACUTE ONLY): 28 min   Charges:   PT Evaluation $PT Eval Low Complexity: 1 Low   PT General Charges $$ ACUTE PT VISIT: 1 Visit         Bernardino JINNY Ruth, PT, DPT Acute Rehabilitation Office [phone]  Bernardino JINNY Ruth 08/11/2023, 6:00 PM

## 2023-08-11 NOTE — Consult Note (Signed)
 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 Otherwise, please hold all antiplatelets and anticoagulants Recommend admission to hospitalist service. Nsgy to follow as consultant ------------------------------------------------------------------------------------------------  CC: walking difficulty  HPI:     Patient is a 78 y.o. male w/ hx HTN, on aspirin 81, remote smoking hx (20 pack years, quit 30 years ago) who presents with 1 month of progressively worsening gait instability d/t both generalized weight loss/ fatigue and RLE weakness. He also notes numbness of the right hand/fingertips for the past 4 days. He denies neck pain, back pain, radicular pain, L sided weakness, aphasia, cancer hx  MRI shows large left frontoparietal intra-axial mass plus small mass near the left hippocampus raising concern for metastases. CTA shows 50% left ICA stenosis but is otherwise unremarkable  Patient Active Problem List   Diagnosis Date Noted   Herpes zoster 12/13/2009   Polycythemia vera (HCC) 04/16/2007   Contracture of palmar fascia 04/16/2007   Hypothyroidism 08/29/2006   Hyperlipidemia 08/29/2006   Essential hypertension 08/29/2006   Past Medical History:  Diagnosis Date   Allergy    Cataract    left  extraction- right eye forming   Dupuytren's contracture    GERD (gastroesophageal reflux disease)    Hiatal hernia    past hx-  suspect at one time   Hypertension    Polycythemia     Past Surgical History:  Procedure Laterality Date   cataract extraction left eye     COLONOSCOPY     DUPUYTREN CONTRACTURE RELEASE Left    POLYPECTOMY     rt knee arthroscopic       (Not in a hospital admission)  Allergies  Allergen Reactions   Lotensin  [Benazepril  Hcl] Cough    Social History   Tobacco Use   Smoking status: Former    Current packs/day: 0.00    Types: Cigarettes    Quit date: 01/14/1985    Years since quitting: 38.5   Smokeless tobacco: Never  Substance Use Topics   Alcohol use: Yes    Alcohol/week: 20.0 standard drinks of alcohol    Types: 20 Cans of beer per week    Comment: beer daily     Family History  Problem Relation Age of Onset   Stroke Mother    Colon cancer Father    Breast cancer Sister    Cancer Sister        breast    Colon polyps Neg Hx    Esophageal cancer Neg Hx      Review of Systems Pertinent items are noted in HPI.  Objective:   Patient Vitals for the past 8 hrs:  BP Temp Temp src Pulse Resp SpO2  08/11/23 1413 (!) 148/68 97.7 F (36.5 C) Oral 67 18 99 %  08/11/23 1215 -- -- -- 70 17 94 %  08/11/23 1200 (!) 148/86 -- -- 70 18 98 %  08/11/23 1116 -- 98.9 F (37.2 C) Oral -- -- --  08/11/23 1115 (!) 157/68 -- -- 74 18 100 %   No intake/output data recorded. No intake/output data recorded.    Exam: GCS 4E 5V 12M  AOx3 No aphasia or dysarthria PERRL EOMI, conjugate gaze FS  TM Full strength LUE and LLE RUE: 4+/5 throughout RLE: 4/5 throughout Subjective decreased sensation to light touch R hand. Otherwise intact   Data ReviewCBC:  Lab Results  Component Value Date   WBC 4.9 08/11/2023   RBC 4.63 08/11/2023   BMP:  Lab Results  Component Value Date   GLUCOSE 113 (H) 08/11/2023   CO2 23 08/11/2023   BUN 7 (L) 08/11/2023   CREATININE 0.79 08/11/2023   CREATININE 0.78 12/08/2019   CALCIUM  8.6 (L) 08/11/2023   Coagulation: No results found for: INR, APTT

## 2023-08-12 ENCOUNTER — Other Ambulatory Visit: Payer: Self-pay | Admitting: Radiation Therapy

## 2023-08-12 ENCOUNTER — Inpatient Hospital Stay (HOSPITAL_COMMUNITY)

## 2023-08-12 ENCOUNTER — Ambulatory Visit
Admit: 2023-08-12 | Discharge: 2023-08-12 | Disposition: A | Discharge status: Home / Self Care | Attending: Radiation Oncology | Admitting: Radiation Oncology

## 2023-08-12 ENCOUNTER — Other Ambulatory Visit: Payer: Self-pay | Admitting: Neurosurgery

## 2023-08-12 DIAGNOSIS — G9389 Other specified disorders of brain: Secondary | ICD-10-CM | POA: Insufficient documentation

## 2023-08-12 DIAGNOSIS — R590 Localized enlarged lymph nodes: Secondary | ICD-10-CM

## 2023-08-12 DIAGNOSIS — R531 Weakness: Secondary | ICD-10-CM | POA: Diagnosis not present

## 2023-08-12 DIAGNOSIS — R918 Other nonspecific abnormal finding of lung field: Secondary | ICD-10-CM | POA: Diagnosis not present

## 2023-08-12 LAB — LIPID PANEL
Cholesterol: 119 mg/dL (ref 0–200)
HDL: 71 mg/dL (ref >40)
LDL Cholesterol: 44 mg/dL (ref 0–99)
Total CHOL/HDL Ratio: 1.7 ratio
Triglycerides: 18 mg/dL (ref <150)
VLDL: 4 mg/dL (ref 0–40)

## 2023-08-12 LAB — OCCULT BLOOD X 1 CARD TO LAB, STOOL: Fecal Occult Bld: NEGATIVE

## 2023-08-12 MED ORDER — VERAPAMIL HCL ER 180 MG PO TBCR
180.0000 mg | EXTENDED_RELEASE_TABLET | Freq: Every day | ORAL | Status: DC
  Administered 2023-08-12 – 2023-08-14 (×3): 180 mg via ORAL
  Filled 2023-08-12 (×4): qty 1

## 2023-08-12 MED ORDER — PANTOPRAZOLE SODIUM 40 MG PO TBEC
40.0000 mg | DELAYED_RELEASE_TABLET | Freq: Every day | ORAL | Status: DC
  Administered 2023-08-14 – 2023-08-15 (×2): 40 mg via ORAL
  Filled 2023-08-12 (×2): qty 1

## 2023-08-12 MED ORDER — GADOBUTROL 1 MMOL/ML IV SOLN
8.0000 mL | Freq: Once | INTRAVENOUS | Status: AC | PRN
  Administered 2023-08-12: 8 mL via INTRAVENOUS

## 2023-08-12 MED ORDER — DOCUSATE SODIUM 100 MG PO CAPS: 100.0000 mg | ORAL_CAPSULE | Freq: Every day | ORAL | Status: DC | PRN

## 2023-08-12 MED ORDER — DEXAMETHASONE 4 MG PO TABS
4.0000 mg | ORAL_TABLET | Freq: Two times a day (BID) | ORAL | Status: DC
  Administered 2023-08-12 – 2023-08-15 (×7): 4 mg via ORAL
  Filled 2023-08-12 (×8): qty 1

## 2023-08-12 MED ORDER — POLYETHYLENE GLYCOL 3350 17 G PO PACK: 17.0000 g | PACK | Freq: Every day | ORAL | Status: DC | PRN

## 2023-08-12 MED ORDER — ENOXAPARIN SODIUM 40 MG/0.4ML IJ SOSY
40.0000 mg | PREFILLED_SYRINGE | Freq: Every day | INTRAMUSCULAR | Status: DC
  Administered 2023-08-12: 40 mg via SUBCUTANEOUS
  Filled 2023-08-12: qty 0.4

## 2023-08-12 MED ORDER — ATORVASTATIN CALCIUM 10 MG PO TABS
20.0000 mg | ORAL_TABLET | Freq: Every day | ORAL | Status: DC
  Administered 2023-08-12 – 2023-08-15 (×4): 20 mg via ORAL
  Filled 2023-08-12 (×4): qty 2

## 2023-08-12 MED ORDER — DEXAMETHASONE SODIUM PHOSPHATE 4 MG/ML IJ SOLN
4.0000 mg | Freq: Two times a day (BID) | INTRAMUSCULAR | Status: DC
  Filled 2023-08-12 (×2): qty 1

## 2023-08-12 NOTE — Consult Note (Addendum)
 Radiation Oncology         [phone] ________________________________  Name: Noah King        MRN: [id number]  Date of Service: 08/12/23              DOB: [birth year]  RR:Wjqsphzm, Darleene, NP     REFERRING PHYSICIAN: Dr. Dorn Ned   DIAGNOSIS: The encounter diagnosis was Brain mass.   HISTORY OF PRESENT ILLNESS: Noah King is a 78 y.o. male seen at the request of Dr. Ned for what appears to be a new diagnosis of metastatic disease involving the brain.  The patient presented to the emergency room yesterday experiencing weakness with his lower extremities and a fall about a week ago.  He has been experiencing weakness over the course of approximately 1 month.  He was seen in the emergency room and imaging showed a 4.3 x 4.8 cm peripherally enhancing mass with central necrosis at the juncture of the frontal and parietal lobes on the left side with moderate vasogenic edema, local mass effect with partial effacement of the left lateral ventricle, and an additional 5 mm enhancing lesion at the juncture of the posterior hippocampus and crus of the fornix on the left.  Nonspecific chronic microhemorrhages were also noted in a background of parenchymal atrophy and chronic small vessel disease.  CT angio of the head and neck showed a moderately calcified and noncalcified plaque within the proximal left internal carotid artery with 50% luminal stenosis.  Chest abdomen and pelvis CT was performed, there were multiple prominent mediastinal and hilar lymph nodes which were felt to be indeterminant, he had linear and irregular scarring in the right lung apex with patchy areas of platelike atelectasis throughout bilateral lungs without specific mass.  There are multiple calcified and noncalcified nodules throughout both lungs the largest in the right lower lobe measured 7 mm in greatest dimension.  Multiple simple cysts in both kidneys were noted the largest measuring up to 3.2 cm, and  several loops of small bowel in the left abdomen and right lower quadrant showed mild to moderate circumferential wall thickening with perienteric fat stranding and prominence of both fossa recta compatible with enteritis without any other focal findings.  There was a well-defined 1.7 cm lytic lesion in the upper portion of the S1 vertebral body incompletely characterized but favored to be benign and/or degenerative.  Dr. Ned feels that the patient is a good candidate for craniotomy, and has asked Dr. Dewey to consider preoperative stereotactic radiosurgery.  We have been asked to see the patient to begin the process of diagnosing what appears to be a metastatic cancer.   PREVIOUS RADIATION THERAPY: No   PAST MEDICAL HISTORY:  Past Medical History:  Diagnosis Date   Allergy    Cataract    left  extraction- right eye forming   Dupuytren's contracture    GERD (gastroesophageal reflux disease)    Hiatal hernia    past hx-  suspect at one time   Hypertension    Polycythemia        PAST SURGICAL HISTORY: Past Surgical History:  Procedure Laterality Date   cataract extraction left eye     COLONOSCOPY     DUPUYTREN CONTRACTURE RELEASE Left    POLYPECTOMY     rt knee arthroscopic       FAMILY HISTORY:  Family History  Problem Relation Age of Onset   Stroke Mother    Colon cancer Father    Breast cancer Sister  Cancer Sister        breast    Colon polyps Neg Hx    Esophageal cancer Neg Hx      SOCIAL HISTORY:  reports that he quit smoking about 38 years ago. His smoking use included cigarettes. He has never used smokeless tobacco. He reports current alcohol use of about 20.0 standard drinks of alcohol per week. He reports that he does not use drugs. The patient is single. He lives in [place] and his daughter Noah King lives in [place].   ALLERGIES: Lotensin  [benazepril  hcl]   MEDICATIONS:  Current Facility-Administered Medications  Medication Dose Route  Frequency Provider Last Rate Last Admin    stroke: early stages of recovery book   Does not apply Once Tobie Mario GAILS, MD       dexamethasone  (DECADRON ) tablet 4 mg  4 mg Oral Q6H Garst, Jonathan R, MD   4 mg at 08/12/23 9472   Or   dexamethasone  (DECADRON ) injection 4 mg  4 mg Intravenous Q6H Darnella Dorn SAUNDERS, MD       enoxaparin  (LOVENOX ) injection 40 mg  40 mg Subcutaneous Daily Elpidio Reyes DEL, MD       folic acid  (FOLVITE ) tablet 1 mg  1 mg Oral Daily Tobie Mario V, MD   1 mg at 08/11/23 1648   LORazepam  (ATIVAN ) tablet 1-4 mg  1-4 mg Oral Q1H PRN Patel, Ekta V, MD       Or   LORazepam  (ATIVAN ) injection 1-4 mg  1-4 mg Intravenous Q1H PRN Patel, Ekta V, MD       multivitamin with minerals tablet 1 tablet  1 tablet Oral Daily Tobie Mario GAILS, MD   1 tablet at 08/11/23 1648   thiamine  (VITAMIN B1) tablet 100 mg  100 mg Oral Daily Tobie Mario V, MD   100 mg at 08/11/23 1648   Or   thiamine  (VITAMIN B1) injection 100 mg  100 mg Intravenous Daily Tobie Mario GAILS, MD         REVIEW OF SYSTEMS: On review of systems, the patient reports that he is doing okay. He describes improvement in the weakness he's had since coming to the hospital. He reports in the last month he has also noticed loss of control of his urinary habits, and denies a history of known prostate problems. He states his friend has told him in the last three months he appears that he's lost a siginficant amount of weight, which the patient also agrees he's noticed but he's not weighed himself in that time. He reports in retrospect he has noticed constipation which he's treated with dulcolax for about two years, and has had intermittent episodes of dark stools including this change in bowel movement in the last week pretty consistently. He denies any abdominal pain, nausea, or vomiting. No other complaints are verbalized.   PHYSICAL EXAM:  Wt Readings from Last 3 Encounters:  12/17/22 175 lb (79.4 kg)  11/21/22 177 lb (80.3 kg)   05/15/22 172 lb (78 kg)   Temp Readings from Last 3 Encounters:  08/12/23 97.9 F (36.6 C) (Oral)  12/17/22 98.1 F (36.7 C) (Oral)  05/15/22 98.3 F (36.8 C) (Oral)   BP Readings from Last 3 Encounters:  08/12/23 (!) 152/85  12/17/22 130/60  11/21/22 130/75   Pulse Readings from Last 3 Encounters:  08/12/23 89  12/17/22 65  05/15/22 70   Pain Assessment Pain Score: 0-No pain/10  Unable to assess due to encounter type  ECOG = 2  0 - Asymptomatic (Fully active, able to carry on all predisease activities without restriction)  1 - Symptomatic but completely ambulatory (Restricted in physically strenuous activity but ambulatory and able to carry out work of a light or sedentary nature. For example, light housework, office work)  2 - Symptomatic, <50% in bed during the day (Ambulatory and capable of all self care but unable to carry out any work activities. Up and about more than 50% of waking hours)  3 - Symptomatic, >50% in bed, but not bedbound (Capable of only limited self-care, confined to bed or chair 50% or more of waking hours)  4 - Bedbound (Completely disabled. Cannot carry on any self-care. Totally confined to bed or chair)  5 - Death   Raylene MM, Creech RH, Tormey DC, et al. [phone]). Toxicity and response criteria of the [facility]. Am. DOROTHA Bridges. Oncol. 5 (6): 649-55    LABORATORY DATA:  Lab Results  Component Value Date   WBC 4.9 08/11/2023   HGB 12.8 (L) 08/11/2023   HCT 38.5 (L) 08/11/2023   MCV 83.2 08/11/2023   PLT 205 08/11/2023   Lab Results  Component Value Date   NA 136 08/11/2023   K 3.8 08/11/2023   CL 104 08/11/2023   CO2 23 08/11/2023   Lab Results  Component Value Date   ALT 15 08/11/2023   AST 17 08/11/2023   ALKPHOS 56 08/11/2023   BILITOT 1.5 (H) 08/11/2023      RADIOGRAPHY: CT CHEST ABDOMEN PELVIS W CONTRAST Result Date: 08/11/2023 CLINICAL DATA:  Brain/CNS neoplasm, staging. Increased weakness.  * Tracking Code: BO * EXAM: CT CHEST, ABDOMEN, AND PELVIS WITH CONTRAST TECHNIQUE: Multidetector CT imaging of the chest, abdomen and pelvis was performed following the standard protocol during bolus administration of intravenous contrast. RADIATION DOSE REDUCTION: This exam was performed according to the departmental dose-optimization program which includes automated exposure control, adjustment of the mA and/or kV according to patient size and/or use of iterative reconstruction technique. CONTRAST:  75mL OMNIPAQUE  IOHEXOL  350 MG/ML SOLN COMPARISON:  None Available. FINDINGS: CT CHEST FINDINGS Cardiovascular: Normal cardiac size. No pericardial effusion. No aortic aneurysm. There are coronary artery calcifications, in keeping with coronary artery disease. There are also mild peripheral atherosclerotic vascular calcifications of thoracic aorta and its major branches. Mediastinum/Nodes: Visualized thyroid  gland appears grossly unremarkable. No solid / cystic mediastinal masses. The esophagus is nondistended precluding optimal assessment. There are few mildly prominent mediastinal and hilar lymph nodes, which are indeterminate in etiology. No axillary lymphadenopathy by size criteria. Lungs/Pleura: The central tracheo-bronchial tree is patent. Linear irregular area of scarring noted in the right lung apex. There are additional patchy areas of linear, plate-like atelectasis and/or scarring throughout bilateral lungs. No mass or consolidation. No pleural effusion or pneumothorax. There multiple calcified and noncalcified nodules throughout bilateral lungs (marked with electronic arrow sign on series 2006), with largest in the right lung lower lobe measuring 7 x 7 mm. In the absence of prior imaging and in this patient with history of malignancy, these are suspicious. Short-term follow-up examination in 6-12 weeks is recommended to evaluate for the growth pattern. Musculoskeletal: The visualized soft tissues of the chest  wall are grossly unremarkable. No suspicious osseous lesions. There are mild multilevel degenerative changes in the visualized spine. CT ABDOMEN PELVIS FINDINGS Hepatobiliary: The liver is normal in size. Non-cirrhotic configuration. No suspicious mass. No intrahepatic or extrahepatic bile duct dilation. No calcified gallstones. Normal gallbladder wall thickness.  No pericholecystic inflammatory changes. Pancreas: Unremarkable. No pancreatic ductal dilatation or surrounding inflammatory changes. Spleen: Within normal limits. No focal lesion. Adrenals/Urinary Tract: Adrenal glands are unremarkable. No suspicious renal mass. There are multiple scattered simple cysts throughout bilateral kidneys with largest arising from the right kidney lower pole measuring up to 2.7 x 3.2 cm. No obstructive uropathy on either side. Evaluation for nonobstructing renal calculi is limited due to excreted contrast in the collecting system. Unremarkable urinary bladder. Stomach/Bowel: There is a small sliding hiatal hernia. No disproportionate dilation of the small or large bowel loops. The appendix was not visualized; however there is no acute inflammatory process in the right lower quadrant. There are several loops of small bowel in the left abdomen and right lower quadrant (marked with electronic arrow sign on series 5) exhibiting mild-to-moderate circumferential wall thickening with perienteric fat stranding and prominence of fossa recta, compatible with enteritis, most likely infective/inflammatory in etiology. Vascular/Lymphatic: No ascites or pneumoperitoneum. No abdominal or pelvic lymphadenopathy, by size criteria. No aneurysmal dilation of the major abdominal arteries. There are mild peripheral atherosclerotic vascular calcifications of the aorta and its major branches. Reproductive: Normal size prostate. Symmetric seminal vesicles. Other: There are fat containing umbilical and bilateral inguinal hernias. The soft tissues and  abdominal wall are otherwise unremarkable. Musculoskeletal: No suspicious osseous lesions. There is a relatively well-defined 1.2 x 1.7 cm lytic lesion in the upper portion of S1 vertebrae, incompletely characterized on the current exam but favored benign/degenerative in etiology. There are mild multilevel degenerative changes in the visualized spine. IMPRESSION: 1. There are several loops of small bowel in the left abdomen and right lower quadrant exhibiting mild-to-moderate circumferential wall thickening with perienteric fat stranding and prominence of vasa recta, compatible with enteritis, most likely infective/inflammatory in etiology. 2. There are multiple calcified and noncalcified nodules throughout bilateral lungs, with largest in the right lung lower lobe measuring 7 x 7 mm. In the absence of prior imaging and in this patient with history of malignancy, these are suspicious. Short-term follow-up examination in 6-12 weeks is recommended to evaluate for the growth pattern. 3. No metastatic disease identified within the abdomen or pelvis. 4. Multiple other nonacute observations, as described above. Aortic Atherosclerosis (ICD10-I70.0). Electronically Signed   By: Ree Molt M.D.   On: 08/11/2023 16:26   DG Chest Portable 1 View Result Date: 08/11/2023 CLINICAL DATA:  Several day history of increased dizziness EXAM: PORTABLE CHEST 1 VIEW COMPARISON:  Chest radiograph dated 12/08/2019 FINDINGS: Normal lung volumes. No focal consolidations. Costophrenic angles are not entirely included within the field of view. No definite pleural effusion or pneumothorax. The heart size and mediastinal contours are within normal limits. No acute osseous abnormality. IMPRESSION: No acute disease. Electronically Signed   By: Limin  Xu M.D.   On: 08/11/2023 15:31   CT ANGIO HEAD NECK W WO CM Result Date: 08/11/2023 CLINICAL DATA:  Stroke/TIA, determine embolic source EXAM: CT ANGIOGRAPHY HEAD AND NECK WITH AND WITHOUT  CONTRAST TECHNIQUE: Multidetector CT imaging of the head and neck was performed using the standard protocol during bolus administration of intravenous contrast. Multiplanar CT image reconstructions and MIPs were obtained to evaluate the vascular anatomy. Carotid stenosis measurements (when applicable) are obtained utilizing NASCET criteria, using the distal internal carotid diameter as the denominator. RADIATION DOSE REDUCTION: This exam was performed according to the departmental dose-optimization program which includes automated exposure control, adjustment of the mA and/or kV according to patient size and/or use of iterative reconstruction technique. CONTRAST:  75mL OMNIPAQUE  IOHEXOL  350 MG/ML SOLN COMPARISON:  None Available. FINDINGS: CT HEAD FINDINGS Brain: There is a mixed solid and cystic mass present within the left parietal lobe, measuring approximately 4.6 x 3.0 x 4.0 cm. The solid component is relatively dense to cerebral cortex. There is mild surrounding edema, but there is no shift of the midline structures. There is generalized cerebral volume loss and mild to moderate diffuse cerebral white matter disease. Vascular: Moderate calcific atheromatous disease. Skull: No osseous lesions. Sinuses/Orbits: Moderate mucosal disease within the frontal, ethmoid and maxillary sinuses. Status post left lens replacement. Other: None. Review of the MIP images confirms the above findings CTA NECK FINDINGS Aortic arch: Mild calcific atheromatous disease. Right carotid system: The common carotid artery is normal in caliber. There is minimal calcific plaque within the carotid bulb and origin of the internal carotid artery, but no luminal stenosis. Left carotid system: There is calcific and noncalcific plaque present within the proximal internal carotid artery with approximately 50% luminal stenosis. The remainder of the cervical segment is normal in caliber. Vertebral arteries: The vertebral arteries are codominant and  normal in caliber. Skeleton: Mild multilevel degenerative disc disease. Other neck: None. Upper chest: There are some streaky opacities present within the right lung apex. There some hazy dependent opacities within the upper lobes bilaterally. There is a calcified granuloma also present posterior medially within the left upper lobe. Review of the MIP images confirms the above findings CTA HEAD FINDINGS Anterior circulation: There is mild calcific atheromatous disease within the carotid siphons but no significant stenosis. The anterior and middle cerebral arteries are normal in caliber and unremarkable in appearance. Posterior circulation: The vertebrobasilar system is unremarkable. There is fetal origin of the left posterior cerebral artery. The posterior cerebral arteries and cerebellar arteries are normal in caliber. Venous sinuses: Widely patent. Anatomic variants: Fetal origin of the left posterior cerebral artery. Review of the MIP images confirms the above findings IMPRESSION: 1. 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 2. Moderate calcific and noncalcific plaque within the proximal left internal carotid artery, with estimated 50% luminal stenosis. These results were called by telephone at the time of interpretation on 08/11/2023 at 2:31 pm to provider Dr. CARON SALT , who verbally acknowledged these results. Electronically Signed   By: Evalene Coho M.D.   On: 08/11/2023 14:37   MR BRAIN W WO CONTRAST Result Date: 08/11/2023 CLINICAL DATA:  Provided history: Neuro deficit, acute, stroke suspected. EXAM: MRI HEAD WITHOUT AND WITH CONTRAST TECHNIQUE: Multiplanar, multiecho pulse sequences of the brain and surrounding structures were obtained without and with intravenous contrast. CONTRAST:  8mL GADAVIST  GADOBUTROL  1 MMOL/ML IV SOLN COMPARISON:  None. FINDINGS: Brain: Generalized cerebral atrophy. 4.3 x 3.1 x 4.8 cm mass at the junction of the frontal and parietal lobes  on the left (with involvement of the perirolandic region). The mass has peripheral enhancement and appears centrally necrotic. The mass extends to abut, and may involve, the overlying dura (for instance as seen on series 11, image 10). Susceptibility-weighted signal loss associated with this lesion (greatest superiorly), likely reflecting hemosiderin deposition. Moderate surrounding vasogenic edema. Local mass effect with partial effacement of the posterior body and atrium of the left lateral ventricle. No midline shift. Additional 5 mm enhancing lesion at the junction of the posterior hippocampus and crus of fornix on the left (series 10, image 39) (for instance as seen on series 11, image 13). Subtle edema also present at this site (  series 6, image 14). Background mild multifocal T2 FLAIR hyperintense signal abnormality within the cerebral white matter, nonspecific but compatible chronic small vessel ischemic disease. Several nonspecific chronic microhemorrhages scattered within the supratentorial and infratentorial brain. There is no acute infarct. No extra-axial fluid collection. No midline shift. Vascular: Maintained flow voids within the proximal large arterial vessels. Skull and upper cervical spine: No focal worrisome marrow lesion. Sinuses/Orbits: No mass or acute finding within the imaged orbits. Prior left ocular lens replacement. Moderate right ethmoid sinusitis. 20 mm mucous retention cyst, and minimal background mucosal thickening, within the left maxillary sinus. Mild bilateral ethmoid sinusitis. Mild mucosal thickening within the right frontal sinus. Severe left frontal sinusitis (with complete sinus opacification). IMPRESSION: 1. 4.3 x 4.8 cm peripherally enhancing and centrally necrotic mass at the junction of the frontal and parietal lobes on the left, as described. Moderate surrounding vasogenic edema. Local mass effect with partial effacement of the left lateral ventricle. No midline shift. 2.  Additional 5 mm enhancing lesion at the junction of the posterior hippocampus and crus of fornix on the left. 3. The above described lesions are most suspicious for intracranial metastases. However, a multicentric high-grade primary CNS neoplasm (such as glioblastoma multiforme) could also have this imaging presentation. 4. Several nonspecific chronic microhemorrhages scattered within the supratentorial and infratentorial brain. 5. Background parenchymal atrophy and chronic small vessel ischemic disease. 6. Paranasal sinus disease as described. Electronically Signed   By: Rockey Childs D.O.   On: 08/11/2023 14:24       IMPRESSION/PLAN: 1. 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 Dr. Dewey has reviewed the patient's work up to date including his imaging studies. While the mediastinal and hilar nodes on the CT CAP were indeterminite, we will ask our pulmonary colleagues to review his scan to see if a navigational biopsy should be performed to investigate the source of his brain disease. He also has a sacral lesion to consider if bronchoscopy is not possible. If he has a thoracic malignancy, nodal tissue could be sent for molecular studies where sacral biopsy may not reveal the diagnosis or be able to complete a molecular profiling of the tumor. If his disease is actually a primary brain malignancy, he would not receive SRS and rather would go for craniotomy and postoperative fractionated radiotherapy. We will follow up with Dr. Lanny impression and coordinate with the patient to discuss next steps.  2. Possible melena. The patient has a history of benign colon polyps and his last colonoscopy was in 2022 with Dr. Aneita. I've ordered a guaiac stool test to see if this also needs to be investigated further.  In a visit lasting 60 minutes, greater than 50% of the time was spent by phone and in floor time discussing the patient's condition, in preparation for the discussion, and  coordinating the patient's care.       Donald KYM Husband, PAC    ADDENDUM: Dr. Shelah has been contacted and his colleages as well who perform these procedures. Dr. Jude is in agreement to attempt brochoscopy with [facility]US tomorrow. We will follow up with these results when available and I will speak with the patient and his daughter to discuss the additional information we're trying to gather from this procedure to then decide how to proceed. If his cytology from [facility] shows malignancy, we will proceed with preoperative stereotactic radiosurgery [facility]), but if cytology is negative, Dr. Debby would proceed with craniotomy first. The patient is also aware that guaiac testing  results could influence the need for colonoscopy.     Donald KYM Husband, [facility]    **Disclaimer: This note was dictated with voice recognition software. Similar sounding words can inadvertently be transcribed and this note may contain transcription errors which may not have been corrected upon publication of note.**

## 2023-08-12 NOTE — Progress Notes (Signed)
 Assessment/Plan: 78 y/o M on aspirin 81, remote smoking hx who presents with progressive R hemiparesis, found to have L frontoparietal cystic mass, small L hippocampal focus, and multiple lung nodules consistent with likely metastatic disease  LOS: 1 day  Decadron  4q12 Tentative plan for craniotomy for tumor resection Thursday 8/7. Discussed idea of preop SRS with radiation oncology team. They would require biopsy/path before SRS. They have sent a message to pulmonology to evaluate possible lung biopsy inpatient.  Ok for DVT chemoppx. Otherwise hold anticoagulation and antiplatelets Diet as tolerated Activity as tolerated  Subjective: Patient is unchanged neurologically. He underwent CTCAP overnight.   Objective: Vital signs in last 24 hours: Temp:  [97.7 F (36.5 C)-99.3 F (37.4 C)] 97.9 F (36.6 C) (07/29 0827) Pulse Rate:  [58-89] 89 (07/29 0827) Resp:  [14-20] 20 (07/29 0827) BP: (147-167)/(68-114) 152/85 (07/29 0827) SpO2:  [94 %-100 %] 98 % (07/29 0827)  Intake/Output from previous day: 07/28 0701 - 07/29 0700 In: 120 [P.O.:120] Out: -  Intake/Output this shift: No intake/output data recorded.  Exam: GCS 4E 5V 49M  AOx3 No aphasia or dysarthria PERRL EOMI, conjugate gaze FS TM Full strength LUE and LLE RUE: 4+/5 throughout RLE: 4/5 throughout Subjective decreased sensation to light touch R hand. Otherwise intact  Lab Results: Recent Labs    08/11/23 1155  WBC 4.9  HGB 12.8*  HCT 38.5*  PLT 205   BMET Recent Labs    08/11/23 1155  NA 136  K 3.8  CL 104  CO2 23  GLUCOSE 113*  BUN 7*  CREATININE 0.79  CALCIUM  8.6*    Studies/Results: CT CHEST ABDOMEN PELVIS W CONTRAST Result Date: 08/11/2023 CLINICAL DATA:  Brain/CNS neoplasm, staging. Increased weakness. * Tracking Code: BO * EXAM: CT CHEST, ABDOMEN, AND PELVIS WITH CONTRAST TECHNIQUE: Multidetector CT imaging of the chest, abdomen and pelvis was performed following the standard protocol during  bolus administration of intravenous contrast. RADIATION DOSE REDUCTION: This exam was performed according to the departmental dose-optimization program which includes automated exposure control, adjustment of the mA and/or kV according to patient size and/or use of iterative reconstruction technique. CONTRAST:  75mL OMNIPAQUE  IOHEXOL  350 MG/ML SOLN COMPARISON:  None Available. FINDINGS: CT CHEST FINDINGS Cardiovascular: Normal cardiac size. No pericardial effusion. No aortic aneurysm. There are coronary artery calcifications, in keeping with coronary artery disease. There are also mild peripheral atherosclerotic vascular calcifications of thoracic aorta and its major branches. Mediastinum/Nodes: Visualized thyroid  gland appears grossly unremarkable. No solid / cystic mediastinal masses. The esophagus is nondistended precluding optimal assessment. There are few mildly prominent mediastinal and hilar lymph nodes, which are indeterminate in etiology. No axillary lymphadenopathy by size criteria. Lungs/Pleura: The central tracheo-bronchial tree is patent. Linear irregular area of scarring noted in the right lung apex. There are additional patchy areas of linear, plate-like atelectasis and/or scarring throughout bilateral lungs. No mass or consolidation. No pleural effusion or pneumothorax. There multiple calcified and noncalcified nodules throughout bilateral lungs (marked with electronic arrow sign on series 2006), with largest in the right lung lower lobe measuring 7 x 7 mm. In the absence of prior imaging and in this patient with history of malignancy, these are suspicious. Short-term follow-up examination in 6-12 weeks is recommended to evaluate for the growth pattern. Musculoskeletal: The visualized soft tissues of the chest wall are grossly unremarkable. No suspicious osseous lesions. There are mild multilevel degenerative changes in the visualized spine. CT ABDOMEN PELVIS FINDINGS Hepatobiliary: The liver is  normal in size. Non-cirrhotic  configuration. No suspicious mass. No intrahepatic or extrahepatic bile duct dilation. No calcified gallstones. Normal gallbladder wall thickness. No pericholecystic inflammatory changes. Pancreas: Unremarkable. No pancreatic ductal dilatation or surrounding inflammatory changes. Spleen: Within normal limits. No focal lesion. Adrenals/Urinary Tract: Adrenal glands are unremarkable. No suspicious renal mass. There are multiple scattered simple cysts throughout bilateral kidneys with largest arising from the right kidney lower pole measuring up to 2.7 x 3.2 cm. No obstructive uropathy on either side. Evaluation for nonobstructing renal calculi is limited due to excreted contrast in the collecting system. Unremarkable urinary bladder. Stomach/Bowel: There is a small sliding hiatal hernia. No disproportionate dilation of the small or large bowel loops. The appendix was not visualized; however there is no acute inflammatory process in the right lower quadrant. There are several loops of small bowel in the left abdomen and right lower quadrant (marked with electronic arrow sign on series 5) exhibiting mild-to-moderate circumferential wall thickening with perienteric fat stranding and prominence of fossa recta, compatible with enteritis, most likely infective/inflammatory in etiology. Vascular/Lymphatic: No ascites or pneumoperitoneum. No abdominal or pelvic lymphadenopathy, by size criteria. No aneurysmal dilation of the major abdominal arteries. There are mild peripheral atherosclerotic vascular calcifications of the aorta and its major branches. Reproductive: Normal size prostate. Symmetric seminal vesicles. Other: There are fat containing umbilical and bilateral inguinal hernias. The soft tissues and abdominal wall are otherwise unremarkable. Musculoskeletal: No suspicious osseous lesions. There is a relatively well-defined 1.2 x 1.7 cm lytic lesion in the upper portion of S1 vertebrae,  incompletely characterized on the current exam but favored benign/degenerative in etiology. There are mild multilevel degenerative changes in the visualized spine. IMPRESSION: 1. There are several loops of small bowel in the left abdomen and right lower quadrant exhibiting mild-to-moderate circumferential wall thickening with perienteric fat stranding and prominence of vasa recta, compatible with enteritis, most likely infective/inflammatory in etiology. 2. There are multiple calcified and noncalcified nodules throughout bilateral lungs, with largest in the right lung lower lobe measuring 7 x 7 mm. In the absence of prior imaging and in this patient with history of malignancy, these are suspicious. Short-term follow-up examination in 6-12 weeks is recommended to evaluate for the growth pattern. 3. No metastatic disease identified within the abdomen or pelvis. 4. Multiple other nonacute observations, as described above. Aortic Atherosclerosis (ICD10-I70.0). Electronically Signed   By: Ree Molt M.D.   On: 08/11/2023 16:26   DG Chest Portable 1 View Result Date: 08/11/2023 CLINICAL DATA:  Several day history of increased dizziness EXAM: PORTABLE CHEST 1 VIEW COMPARISON:  Chest radiograph dated 12/08/2019 FINDINGS: Normal lung volumes. No focal consolidations. Costophrenic angles are not entirely included within the field of view. No definite pleural effusion or pneumothorax. The heart size and mediastinal contours are within normal limits. No acute osseous abnormality. IMPRESSION: No acute disease. Electronically Signed   By: Limin  Xu M.D.   On: 08/11/2023 15:31   CT ANGIO HEAD NECK W WO CM Result Date: 08/11/2023 CLINICAL DATA:  Stroke/TIA, determine embolic source EXAM: CT ANGIOGRAPHY HEAD AND NECK WITH AND WITHOUT CONTRAST TECHNIQUE: Multidetector CT imaging of the head and neck was performed using the standard protocol during bolus administration of intravenous contrast. Multiplanar CT image  reconstructions and MIPs were obtained to evaluate the vascular anatomy. Carotid stenosis measurements (when applicable) are obtained utilizing NASCET criteria, using the distal internal carotid diameter as the denominator. RADIATION DOSE REDUCTION: This exam was performed according to the departmental dose-optimization program which includes automated exposure control,  adjustment of the mA and/or kV according to patient size and/or use of iterative reconstruction technique. CONTRAST:  75mL OMNIPAQUE  IOHEXOL  350 MG/ML SOLN COMPARISON:  None Available. FINDINGS: CT HEAD FINDINGS Brain: There is a mixed solid and cystic mass present within the left parietal lobe, measuring approximately 4.6 x 3.0 x 4.0 cm. The solid component is relatively dense to cerebral cortex. There is mild surrounding edema, but there is no shift of the midline structures. There is generalized cerebral volume loss and mild to moderate diffuse cerebral white matter disease. Vascular: Moderate calcific atheromatous disease. Skull: No osseous lesions. Sinuses/Orbits: Moderate mucosal disease within the frontal, ethmoid and maxillary sinuses. Status post left lens replacement. Other: None. Review of the MIP images confirms the above findings CTA NECK FINDINGS Aortic arch: Mild calcific atheromatous disease. Right carotid system: The common carotid artery is normal in caliber. There is minimal calcific plaque within the carotid bulb and origin of the internal carotid artery, but no luminal stenosis. Left carotid system: There is calcific and noncalcific plaque present within the proximal internal carotid artery with approximately 50% luminal stenosis. The remainder of the cervical segment is normal in caliber. Vertebral arteries: The vertebral arteries are codominant and normal in caliber. Skeleton: Mild multilevel degenerative disc disease. Other neck: None. Upper chest: There are some streaky opacities present within the right lung apex. There some  hazy dependent opacities within the upper lobes bilaterally. There is a calcified granuloma also present posterior medially within the left upper lobe. Review of the MIP images confirms the above findings CTA HEAD FINDINGS Anterior circulation: There is mild calcific atheromatous disease within the carotid siphons but no significant stenosis. The anterior and middle cerebral arteries are normal in caliber and unremarkable in appearance. Posterior circulation: The vertebrobasilar system is unremarkable. There is fetal origin of the left posterior cerebral artery. The posterior cerebral arteries and cerebellar arteries are normal in caliber. Venous sinuses: Widely patent. Anatomic variants: Fetal origin of the left posterior cerebral artery. Review of the MIP images confirms the above findings IMPRESSION: 1. 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 2. Moderate calcific and noncalcific plaque within the proximal left internal carotid artery, with estimated 50% luminal stenosis. These results were called by telephone at the time of interpretation on 08/11/2023 at 2:31 pm to provider Dr. CARON SALT , who verbally acknowledged these results. Electronically Signed   By: Evalene Coho M.D.   On: 08/11/2023 14:37   MR BRAIN W WO CONTRAST Result Date: 08/11/2023 CLINICAL DATA:  Provided history: Neuro deficit, acute, stroke suspected. EXAM: MRI HEAD WITHOUT AND WITH CONTRAST TECHNIQUE: Multiplanar, multiecho pulse sequences of the brain and surrounding structures were obtained without and with intravenous contrast. CONTRAST:  8mL GADAVIST  GADOBUTROL  1 MMOL/ML IV SOLN COMPARISON:  None. FINDINGS: Brain: Generalized cerebral atrophy. 4.3 x 3.1 x 4.8 cm mass at the junction of the frontal and parietal lobes on the left (with involvement of the perirolandic region). The mass has peripheral enhancement and appears centrally necrotic. The mass extends to abut, and may involve, the  overlying dura (for instance as seen on series 11, image 10). Susceptibility-weighted signal loss associated with this lesion (greatest superiorly), likely reflecting hemosiderin deposition. Moderate surrounding vasogenic edema. Local mass effect with partial effacement of the posterior body and atrium of the left lateral ventricle. No midline shift. Additional 5 mm enhancing lesion at the junction of the posterior hippocampus and crus of fornix on the left (series 10,  image 39) (for instance as seen on series 11, image 13). Subtle edema also present at this site (series 6, image 14). Background mild multifocal T2 FLAIR hyperintense signal abnormality within the cerebral white matter, nonspecific but compatible chronic small vessel ischemic disease. Several nonspecific chronic microhemorrhages scattered within the supratentorial and infratentorial brain. There is no acute infarct. No extra-axial fluid collection. No midline shift. Vascular: Maintained flow voids within the proximal large arterial vessels. Skull and upper cervical spine: No focal worrisome marrow lesion. Sinuses/Orbits: No mass or acute finding within the imaged orbits. Prior left ocular lens replacement. Moderate right ethmoid sinusitis. 20 mm mucous retention cyst, and minimal background mucosal thickening, within the left maxillary sinus. Mild bilateral ethmoid sinusitis. Mild mucosal thickening within the right frontal sinus. Severe left frontal sinusitis (with complete sinus opacification). IMPRESSION: 1. 4.3 x 4.8 cm peripherally enhancing and centrally necrotic mass at the junction of the frontal and parietal lobes on the left, as described. Moderate surrounding vasogenic edema. Local mass effect with partial effacement of the left lateral ventricle. No midline shift. 2. Additional 5 mm enhancing lesion at the junction of the posterior hippocampus and crus of fornix on the left. 3. The above described lesions are most suspicious for  intracranial metastases. However, a multicentric high-grade primary CNS neoplasm (such as glioblastoma multiforme) could also have this imaging presentation. 4. Several nonspecific chronic microhemorrhages scattered within the supratentorial and infratentorial brain. 5. Background parenchymal atrophy and chronic small vessel ischemic disease. 6. Paranasal sinus disease as described. Electronically Signed   By: Rockey Childs D.O.   On: 08/11/2023 14:24      Dorn JONELLE Glade 08/12/2023, 8:30 AM

## 2023-08-12 NOTE — Consult Note (Signed)
 NAME:  Noah King, MRN:  [id number], DOB:  Jan 18, 1945, LOS: 1 ADMISSION DATE:  08/11/2023, CONSULTATION DATE:  08/12/23 REFERRING MD:  Elpidio, CHIEF COMPLAINT:  lung nodules    History of Present Illness:  Noah King is a 78 y.o. M with PMH significant for Hypothyroidism, HTN, polycythemia vera, ETOH and tobacco abuse who initially presented to the ED 7/28 with R sided and generalized weakness with gait disturbance.  This has been going on for 2 years but worsening over the past 2 weeks to the point where he is dependent on a walker. he was found to have a peripherally enhancing necrotic mass at the junction of the L frontal and parietal lobes with local mass effect with 5mm enhancing lesion of the posterior hippocampus.  CTA chest revealed multiple calcified and non-calcified nodules throughout the bilateral lungs -largest 7 mm nodule at the right base close to the diaphragm and right hilar lymphadenopathy.  PCCM was consulted for bronchoscopy in this setting.  Seen by radiation oncology would like to start him on radiation while awaiting brain biopsy on 8/7 He is followed up with 2 packs/day but quit 40 years ago, but 35 pack years  Pertinent  Medical History   has a past medical history of Allergy, Cataract, Dupuytren's contracture, GERD (gastroesophageal reflux disease), Hiatal hernia, Hypertension, and Polycythemia.    Significant Hospital Events: Including procedures, antibiotic start and stop dates in addition to other pertinent events   7/28 admit with brain mass and lung nodules   Interim History / Subjective:  Denies chest pain shortness of breath No headache or blurred vision  Objective    Blood pressure (!) 149/70, pulse 93, temperature 98.4 F (36.9 C), temperature source Oral, resp. rate 20, SpO2 97%.        Intake/Output Summary (Last 24 hours) at 08/12/2023 1241 Last data filed at 08/12/2023 0600 Gross per 24 hour  Intake 120 ml  Output --  Net 120 ml    There were no vitals filed for this visit.  Gen. Pleasant, elderly, well-nourished, in no distress, normal affect ENT - no pallor,icterus, no JVD, no lymphadenopathy Neck: No JVD, no thyromegaly, no carotid bruits Lungs: no use of accessory muscles, no dullness to percussion, clear without rales or rhonchi  Cardiovascular: Rhythm regular, heart sounds  normal, no murmurs or gallops, no peripheral edema Abdomen: soft and non-tender, no hepatosplenomegaly, BS normal. Musculoskeletal: No deformities, no cyanosis or clubbing Neuro:  alert, non focal    Resolved problem list   Assessment and Plan   Left frontal parietal cystic brain mass Multiple bilateral pulmonary nodules Mediastinal and right hilar lymphadenopathy History of tobacco use Plan for craniotomy for tumor resection 8/7 Radiation oncology consulted, need biopsy/path before [facility] -plan for EBUS and biopsy , planned for 7/30 Discussed risks and benefits with patient in detail and he is agreeable to proceed.  Discussed that given small size of lymphadenopathy, yield may be low He is not excited about the prospect of brain biopsy and feels encouraged about the possibility of getting a diagnosis with bronchoscopy procedure      Best Practice (right click and Reselect all SmartList Selections daily)    Code Status:  full code Last date of multidisciplinary goals of care discussion [NA]  Labs   CBC: Recent Labs  Lab 08/11/23 1155  WBC 4.9  NEUTROABS 3.0  HGB 12.8*  HCT 38.5*  MCV 83.2  PLT 205    Basic Metabolic Panel: Recent Labs  Lab 08/11/23  1155  NA 136  K 3.8  CL 104  CO2 23  GLUCOSE 113*  BUN 7*  CREATININE 0.79  CALCIUM  8.6*   GFR: CrCl cannot be calculated (Unknown ideal weight.). Recent Labs  Lab 08/11/23 1155  WBC 4.9    Liver Function Tests: Recent Labs  Lab 08/11/23 1155  AST 17  ALT 15  ALKPHOS 56  BILITOT 1.5*  PROT 6.5  ALBUMIN 3.5   No results for input(s):  LIPASE, AMYLASE in the last 168 hours. No results for input(s): AMMONIA in the last 168 hours.  ABG No results found for: PHART, PCO2ART, PO2ART, HCO3, TCO2, ACIDBASEDEF, O2SAT   Coagulation Profile: Recent Labs  Lab 08/11/23 1538  INR 1.1    Cardiac Enzymes: No results for input(s): CKTOTAL, CKMB, CKMBINDEX, TROPONINI in the last 168 hours.  HbA1C: Hgb A1c MFr Bld  Date/Time Value Ref Range Status  08/11/2023 11:55 AM 4.7 (L) 4.8 - 5.6 % Final    Comment:    (NOTE) Diagnosis of Diabetes The following HbA1c ranges recommended by the American Diabetes Association (ADA) may be used as an aid in the diagnosis of diabetes mellitus.  Hemoglobin             Suggested A1C NGSP%              Diagnosis  <5.7                   Non Diabetic  5.7-6.4                Pre-Diabetic  >6.4                   Diabetic  <7.0                   Glycemic control for                       adults with diabetes.      CBG: No results for input(s): GLUCAP in the last 168 hours.  Review of Systems:   As in HPI  Past Medical History:  He,  has a past medical history of Allergy, Cataract, Dupuytren's contracture, GERD (gastroesophageal reflux disease), Hiatal hernia, Hypertension, and Polycythemia.   Surgical History:   Past Surgical History:  Procedure Laterality Date   cataract extraction left eye     COLONOSCOPY     DUPUYTREN CONTRACTURE RELEASE Left    POLYPECTOMY     rt knee arthroscopic       Social History:   reports that he quit smoking about 38 years ago. His smoking use included cigarettes. He has never used smokeless tobacco. He reports current alcohol use of about 20.0 standard drinks of alcohol per week. He reports that he does not use drugs.   Family History:  His family history includes Breast cancer in his sister; Cancer in his sister; Colon cancer in his father; Stroke in his mother. There is no history of Colon polyps or Esophageal  cancer.   Allergies Allergies  Allergen Reactions   Lotensin  [Benazepril  Hcl] Cough     Home Medications  Prior to Admission medications   Medication Sig Start Date End Date Taking? Authorizing Provider  aspirin 81 MG tablet Take 81 mg by mouth daily.   Yes [provider]  atorvastatin  (LIPITOR) 20 MG tablet Take 1 tablet (20 mg total) by mouth daily. 12/17/22  Yes Nafziger, Darleene, NP  docusate sodium  (COLACE)  100 MG capsule Take 100 mg by mouth daily as needed (constipation).   Yes [provider]  losartan  (COZAAR ) 25 MG tablet Take 1 tablet (25 mg total) by mouth daily. 12/17/22  Yes Nafziger, Darleene, NP  Multiple Vitamins-Minerals (MENS 50+ MULTIVITAMIN) TABS Take 1 tablet by mouth daily.   Yes [provider]  omeprazole  (PRILOSEC) 20 MG capsule TAKE 1 CAPSULE BY MOUTH DAILY 12/10/22  Yes Nafziger, Darleene, NP  sildenafil  (REVATIO ) 20 MG tablet Take 1 tablet (20 mg total) by mouth daily as needed. 12/12/20  Yes Nafziger, Darleene, NP  verapamil  (CALAN -SR) 180 MG CR tablet TAKE ONE TABLET BY MOUTH EVERY MORNING 12/10/22  Yes Nafziger, Darleene, NP      Harden Staff MD. FCCP. [facility] Pager : [phone]  If no response to pager , please call [phone] until 7 pm After 7:00 pm call Elink  [phone]   08/12/2023

## 2023-08-12 NOTE — TOC Initial Note (Signed)
 Transition of Care [facility]) - Initial/Assessment Note    Patient Details  Name: Noah King MRN: [id number] Date of Birth: [birth year]  Transition of Care [facility]) CM/SW Contact:    Andrez JULIANNA George, RN Phone Number: 08/12/2023, 11:04 AM  Clinical Narrative:                  Pt is from home alone. States his daughter lives in [place], [place] and can come and stay with him for a little while at his home. She does still work.  Pt uses friends for transportation.  Pt manages his own medications.   Tentative plan for craniotomy for tumor resection Thursday 8/7. Discussed idea of preop SRS with radiation oncology team. They would require biopsy/path before SRS. They have sent a message to pulmonology to evaluate possible lung biopsy inpatient.  CIR doesn't plan on a workup until after craniotomy if pt still needs rehab at that time.   IP Care management following.  Expected Discharge Plan: IP Rehab Facility Barriers to Discharge: Continued Medical Work up   Patient Goals and CMS Choice            Expected Discharge Plan and Services   Discharge Planning Services: CM Consult Post Acute Care Choice: IP Rehab Living arrangements for the past 2 months: Single Family Home                                      Prior Living Arrangements/Services Living arrangements for the past 2 months: Single Family Home Lives with:: Self Patient language and need for interpreter reviewed:: Yes Do you feel safe going back to the place where you live?: Yes          Current home services: DME (cane/ rollator) Criminal Activity/Legal Involvement Pertinent to Current Situation/Hospitalization: No - Comment as needed  Activities of Daily Living      Permission Sought/Granted                  Emotional Assessment Appearance:: Appears stated age Attitude/Demeanor/Rapport: Engaged Affect (typically observed): Accepting Orientation: : Oriented to Self, Oriented to Place, Oriented to   Time, Oriented to Situation   Psych Involvement: No (comment)  Admission diagnosis:  Brain mass [G93.89] Right sided weakness [R53.1] Patient Active Problem List   Diagnosis Date Noted   Right sided weakness 08/11/2023   Herpes zoster 12/13/2009   Polycythemia vera (HCC) 04/16/2007   Contracture of palmar fascia 04/16/2007   Hypothyroidism 08/29/2006   Hyperlipidemia 08/29/2006   Essential hypertension 08/29/2006   PCP:  Merna Huxley, NP Pharmacy:   [facility] [id number], [place] - [address] [place] [address] Phone: [phone] Fax: [phone]     Social Drivers of Health (SDOH) Social History: SDOH Screenings   Food Insecurity: No Food Insecurity (08/12/2023)  Housing: Low Risk  (08/12/2023)  Transportation Needs: No Transportation Needs (08/12/2023)  Utilities: Not At Risk (08/12/2023)  Alcohol Screen: Low Risk  (11/01/2021)  Depression (PHQ2-9): Low Risk  (11/21/2022)  Financial Resource Strain: Low Risk  (11/01/2021)  Physical Activity: Insufficiently Active (11/01/2021)  Social Connections: Moderately Isolated (08/12/2023)  Stress: No Stress Concern Present (11/01/2021)  Tobacco Use: Medium Risk (12/27/2022)   Received from [facility]   SDOH Interventions:     Readmission Risk Interventions     No data to display

## 2023-08-12 NOTE — H&P (View-Only) (Signed)
 NAME:  Noah King, MRN:  [id number], DOB:  Jan 18, 1945, LOS: 1 ADMISSION DATE:  08/11/2023, CONSULTATION DATE:  08/12/23 REFERRING MD:  Elpidio, CHIEF COMPLAINT:  lung nodules    History of Present Illness:  Noah King is a 78 y.o. M with PMH significant for Hypothyroidism, HTN, polycythemia vera, ETOH and tobacco abuse who initially presented to the ED 7/28 with R sided and generalized weakness with gait disturbance.  This has been going on for 2 years but worsening over the past 2 weeks to the point where he is dependent on a walker. he was found to have a peripherally enhancing necrotic mass at the junction of the L frontal and parietal lobes with local mass effect with 5mm enhancing lesion of the posterior hippocampus.  CTA chest revealed multiple calcified and non-calcified nodules throughout the bilateral lungs -largest 7 mm nodule at the right base close to the diaphragm and right hilar lymphadenopathy.  PCCM was consulted for bronchoscopy in this setting.  Seen by radiation oncology would like to start him on radiation while awaiting brain biopsy on 8/7 He is followed up with 2 packs/day but quit 40 years ago, but 35 pack years  Pertinent  Medical History   has a past medical history of Allergy, Cataract, Dupuytren's contracture, GERD (gastroesophageal reflux disease), Hiatal hernia, Hypertension, and Polycythemia.    Significant Hospital Events: Including procedures, antibiotic start and stop dates in addition to other pertinent events   7/28 admit with brain mass and lung nodules   Interim History / Subjective:  Denies chest pain shortness of breath No headache or blurred vision  Objective    Blood pressure (!) 149/70, pulse 93, temperature 98.4 F (36.9 C), temperature source Oral, resp. rate 20, SpO2 97%.        Intake/Output Summary (Last 24 hours) at 08/12/2023 1241 Last data filed at 08/12/2023 0600 Gross per 24 hour  Intake 120 ml  Output --  Net 120 ml    There were no vitals filed for this visit.  Gen. Pleasant, elderly, well-nourished, in no distress, normal affect ENT - no pallor,icterus, no JVD, no lymphadenopathy Neck: No JVD, no thyromegaly, no carotid bruits Lungs: no use of accessory muscles, no dullness to percussion, clear without rales or rhonchi  Cardiovascular: Rhythm regular, heart sounds  normal, no murmurs or gallops, no peripheral edema Abdomen: soft and non-tender, no hepatosplenomegaly, BS normal. Musculoskeletal: No deformities, no cyanosis or clubbing Neuro:  alert, non focal    Resolved problem list   Assessment and Plan   Left frontal parietal cystic brain mass Multiple bilateral pulmonary nodules Mediastinal and right hilar lymphadenopathy History of tobacco use Plan for craniotomy for tumor resection 8/7 Radiation oncology consulted, need biopsy/path before [facility] -plan for EBUS and biopsy , planned for 7/30 Discussed risks and benefits with patient in detail and he is agreeable to proceed.  Discussed that given small size of lymphadenopathy, yield may be low He is not excited about the prospect of brain biopsy and feels encouraged about the possibility of getting a diagnosis with bronchoscopy procedure      Best Practice (right click and Reselect all SmartList Selections daily)    Code Status:  full code Last date of multidisciplinary goals of care discussion [NA]  Labs   CBC: Recent Labs  Lab 08/11/23 1155  WBC 4.9  NEUTROABS 3.0  HGB 12.8*  HCT 38.5*  MCV 83.2  PLT 205    Basic Metabolic Panel: Recent Labs  Lab 08/11/23  1155  NA 136  K 3.8  CL 104  CO2 23  GLUCOSE 113*  BUN 7*  CREATININE 0.79  CALCIUM  8.6*   GFR: CrCl cannot be calculated (Unknown ideal weight.). Recent Labs  Lab 08/11/23 1155  WBC 4.9    Liver Function Tests: Recent Labs  Lab 08/11/23 1155  AST 17  ALT 15  ALKPHOS 56  BILITOT 1.5*  PROT 6.5  ALBUMIN 3.5   No results for input(s):  LIPASE, AMYLASE in the last 168 hours. No results for input(s): AMMONIA in the last 168 hours.  ABG No results found for: PHART, PCO2ART, PO2ART, HCO3, TCO2, ACIDBASEDEF, O2SAT   Coagulation Profile: Recent Labs  Lab 08/11/23 1538  INR 1.1    Cardiac Enzymes: No results for input(s): CKTOTAL, CKMB, CKMBINDEX, TROPONINI in the last 168 hours.  HbA1C: Hgb A1c MFr Bld  Date/Time Value Ref Range Status  08/11/2023 11:55 AM 4.7 (L) 4.8 - 5.6 % Final    Comment:    (NOTE) Diagnosis of Diabetes The following HbA1c ranges recommended by the American Diabetes Association (ADA) may be used as an aid in the diagnosis of diabetes mellitus.  Hemoglobin             Suggested A1C NGSP%              Diagnosis  <5.7                   Non Diabetic  5.7-6.4                Pre-Diabetic  >6.4                   Diabetic  <7.0                   Glycemic control for                       adults with diabetes.      CBG: No results for input(s): GLUCAP in the last 168 hours.  Review of Systems:   As in HPI  Past Medical History:  He,  has a past medical history of Allergy, Cataract, Dupuytren's contracture, GERD (gastroesophageal reflux disease), Hiatal hernia, Hypertension, and Polycythemia.   Surgical History:   Past Surgical History:  Procedure Laterality Date   cataract extraction left eye     COLONOSCOPY     DUPUYTREN CONTRACTURE RELEASE Left    POLYPECTOMY     rt knee arthroscopic       Social History:   reports that he quit smoking about 38 years ago. His smoking use included cigarettes. He has never used smokeless tobacco. He reports current alcohol use of about 20.0 standard drinks of alcohol per week. He reports that he does not use drugs.   Family History:  His family history includes Breast cancer in his sister; Cancer in his sister; Colon cancer in his father; Stroke in his mother. There is no history of Colon polyps or Esophageal  cancer.   Allergies Allergies  Allergen Reactions   Lotensin  [Benazepril  Hcl] Cough     Home Medications  Prior to Admission medications   Medication Sig Start Date End Date Taking? Authorizing Provider  aspirin 81 MG tablet Take 81 mg by mouth daily.   Yes [provider]  atorvastatin  (LIPITOR) 20 MG tablet Take 1 tablet (20 mg total) by mouth daily. 12/17/22  Yes Nafziger, Darleene, NP  docusate sodium  (COLACE)  100 MG capsule Take 100 mg by mouth daily as needed (constipation).   Yes [provider]  losartan  (COZAAR ) 25 MG tablet Take 1 tablet (25 mg total) by mouth daily. 12/17/22  Yes Nafziger, Darleene, NP  Multiple Vitamins-Minerals (MENS 50+ MULTIVITAMIN) TABS Take 1 tablet by mouth daily.   Yes [provider]  omeprazole  (PRILOSEC) 20 MG capsule TAKE 1 CAPSULE BY MOUTH DAILY 12/10/22  Yes Nafziger, Darleene, NP  sildenafil  (REVATIO ) 20 MG tablet Take 1 tablet (20 mg total) by mouth daily as needed. 12/12/20  Yes Nafziger, Darleene, NP  verapamil  (CALAN -SR) 180 MG CR tablet TAKE ONE TABLET BY MOUTH EVERY MORNING 12/10/22  Yes Nafziger, Darleene, NP      Harden Staff MD. FCCP. [facility] Pager : [phone]  If no response to pager , please call [phone] until 7 pm After 7:00 pm call Elink  [phone]   08/12/2023

## 2023-08-12 NOTE — TOC CAGE-AID Note (Signed)
 Transition of Care [facility]) - CAGE-AID Screening   Patient Details  Name: Noah King MRN: [id number] Date of Birth: 07-26-1945  Transition of Care [facility]) CM/SW Contact:    Andrez JULIANNA George, RN Phone Number: 08/12/2023, 11:02 AM   Clinical Narrative:  Pt refused inpatient/ outpatient alcohol counseling resources.   CAGE-AID Screening:    Have You Ever Felt You Ought to Cut Down on Your Drinking or Drug Use?: Yes Have People Annoyed You By Critizing Your Drinking Or Drug Use?: No Have You Felt Bad Or Guilty About Your Drinking Or Drug Use?: No Have You Ever Had a Drink or Used Drugs First Thing In The Morning to Steady Your Nerves or to Get Rid of a Hangover?: No CAGE-AID Score: 1  Substance Abuse Education Offered: Yes (refused)

## 2023-08-12 NOTE — Progress Notes (Signed)
   Inpatient Rehabilitation Admissions Coordinator   Noted plans for tumor resection 8/7. We will follow up postop.  Heron Leavell, RN, MSN Rehab Admissions Coordinator [phone] 08/12/2023 9:24 AM

## 2023-08-12 NOTE — Consult Note (Addendum)
  [facility]cer Center CONSULT NOTE  Patient Care Team: Merna Huxley, NP as PCP - General (Family Medicine) Liane Sharyne MATSU, [facility] (Inactive) as Pharmacist (Pharmacist)  CHIEF COMPLAINTS/PURPOSE OF CONSULTATION:  Brain mass  REFERRING PHYSICIAN: Dr. Elpidio  HISTORY OF PRESENTING ILLNESS:  Noah King 78 y.o. male who was admitted on 08/11/2023 with complaints of extremity weakness. Workup was done including imaging which showed brain mass.  Therefore oncology consultation has been requested. Patient is seen awake alert and oriented x 3 sitting up in chair at bedside. He is a very pleasant gentleman who seems to be a good historian. Patient reports that over the last 3 to 4 years he had progressive staggering and unbalanced gait, which necessitated him to buy a walking stick.  Patient initially thought it was all age-related.  He went on to buy a 3 legged cane, then as his balance became worse he bought a rollator.  Acknowledges that he fell a few times at home.  Denies hitting his head.  Denies headaches, dizziness, nausea/vomiting or other GI symptoms, chest pain or other acute pain. Reports his balance became much worse in the last 1 month.  Medical history significant for PAD, hypertension, and hyperlipidemia.  Surgical history includes remote history of right knee surgery, surgery on left hand for Dupuytren's contracture. Family oncologic history significant for father dying at age 102 from colon cancer.  His mother also died at age 50 from strokes and unknown cancer.   Social history is significant for 20-year history of tobacco use beginning at age 54, quit in his 48s; admits to being a heavy smoker at 2 packs/day.  Admits to alcohol use 1-4 beers per day, also quit several years ago.  Denies illicit or recreational drug use.   Formerly in the [employer] x 3 years with no exposure to hazardous materials.  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  Lives alone.  Patient has 1 daughter who is alive and well.     I have reviewed his chart and materials related to his cancer extensively and collaborated history with the patient. Summary of oncologic history is as follows: Oncology History   No history exists.    ASSESSMENT & PLAN:  Brain mass Multiple bilateral pulmonary nodules Mediastinal and right hilar lymphadenopathy -- MR brain done 7/28 shows 4.3 x 4.8 cm necrotic mass at junction of frontal and parietal lobes on the left. Suspicious for intracranial mets however high-grade primary CNS neoplasm cannot be excluded.  --CT imaging chest shows multiple nodules throughout bilateral lungs, largest in RLL measuring 7x7 mm, suspicious for malignancy.  No mets in abdomen or pelvis noted. -- Seen by Rad Onc with consideration for SRS. Needs biopsy before SRS -- Pulmonary eval done -plan for EBUS with biopsy on 7/30 -- Plan noted for craniotomy for tumor resection on 08/21/2023.  -- Neurosurg team following -- Medical Oncology will also require biopsy/path for treatment planning if needed.  -- Medical Oncology/Dr. Everlynn Sagun following closely and will make further evaluation and treatment recommendations  Generalized weakness with gait disturbance History of falls --Patient reports 3 to 4 years of progressively worsening gait necessitating ambulation aids.   --Admits to multiple falls at home --Likely secondary to brain mass --Continue supportive care and falls precautions.  Hypertension Hyperlipidemia PAD -- Blood pressure 149/70 --On statin and anti-hypertensives, continue as ordered --Continue to monitor BP closely  MEDICAL HISTORY:  Past Medical History:  Diagnosis Date   Allergy    Cataract    left  extraction- right eye forming   Dupuytren's contracture    GERD (gastroesophageal reflux disease)    Hiatal hernia    past hx-  suspect at  one time   Hypertension    Polycythemia     SURGICAL HISTORY: Past Surgical History:  Procedure Laterality Date   cataract extraction left eye     COLONOSCOPY     DUPUYTREN CONTRACTURE RELEASE Left    POLYPECTOMY     rt knee arthroscopic      SOCIAL HISTORY: Social History   Socioeconomic History   Marital status: Single    Spouse name: Not on file   Number of children: Not on file   Years of education: Not on file   Highest education level: Not on file  Occupational History   Not on file  Tobacco Use   Smoking status: Former    Current packs/day: 0.00    Types: Cigarettes    Quit date: 01/14/1985    Years since quitting: 38.6   Smokeless tobacco: Never  Vaping Use   Vaping status: Never Used  Substance and Sexual Activity   Alcohol use: Yes    Alcohol/week: 20.0 standard drinks of alcohol    Types: 20 Cans of beer per week    Comment: beer daily    Drug use: No   Sexual activity: Yes    Partners: Female  Other Topics Concern   Not on file  Social History Narrative   Retired- He worked at [employer]   Divorced   One daughter who lives in [place]   Social Drivers of Health   Financial Resource Strain: Low Risk  (11/01/2021)   Overall Financial Resource Strain (CARDIA)    Difficulty of Paying Living Expenses: Not hard at all  Food Insecurity: No Food Insecurity (08/12/2023)   Hunger Vital Sign    Worried About Running Out of Food in the Last Year: Never true    Ran Out of Food in the Last Year: Never true  Transportation Needs: No Transportation Needs (08/12/2023)   PRAPARE - Administrator, Civil Service (Medical): No    Lack of Transportation (Non-Medical): No  Physical Activity: Insufficiently Active (11/01/2021)   Exercise Vital Sign    Days of Exercise per Week: 7 days    Minutes of Exercise per Session: 10 min  Stress: No Stress Concern Present (11/01/2021)   [employer] of Occupational Health - Occupational Stress Questionnaire     Feeling of Stress : Not at all  Social Connections: Moderately Isolated (08/12/2023)   Social Connection and Isolation Panel    Frequency of Communication with Friends and Family: More than three times a week    Frequency of Social Gatherings with Friends and Family: Three times a week    Attends Religious Services: Never    Active Member of Clubs or Organizations: No    Attends Banker Meetings: 1 to 4 times per year    Marital Status: Divorced  Intimate Partner Violence: Not At Risk (08/12/2023)   Humiliation, Afraid, Rape, and Kick questionnaire    Fear of Current or Ex-Partner: No    Emotionally Abused: No    Physically Abused: No    Sexually Abused: No    FAMILY HISTORY: Family History  Problem Relation Age of Onset   Stroke Mother    Colon cancer Father  Breast cancer Sister    Cancer Sister        breast    Colon polyps Neg Hx    Esophageal cancer Neg Hx      PHYSICAL EXAMINATION: ECOG PERFORMANCE STATUS: 3 - Symptomatic, >50% confined to bed  Vitals:   08/12/23 0827 08/12/23 1134  BP: (!) 152/85 (!) 149/70  Pulse: 89 93  Resp: 20 20  Temp: 97.9 F (36.6 C) 98.4 F (36.9 C)  SpO2: 98% 97%   There were no vitals filed for this visit.  GENERAL: alert, +pleasant SKIN: +pale skin color, texture, turgor are normal, no rashes or significant lesions EYES: normal, conjunctiva are pink and non-injected, sclera clear OROPHARYNX: no exudate, no erythema and lips, buccal mucosa, and tongue normal  NECK: supple, thyroid  normal size, non-tender, without nodularity LYMPH: no palpable lymphadenopathy in the cervical, axillary or inguinal LUNGS: clear to auscultation and percussion with normal breathing effort HEART: regular rate & rhythm and no murmurs and no lower extremity edema ABDOMEN: abdomen soft, non-tender and normal bowel sounds MUSCULOSKELETAL: no cyanosis of digits and no clubbing  PSYCH: alert & oriented x 3 with fluent speech NEURO: no focal  motor/sensory deficits   ALLERGIES:  is allergic to lotensin  [benazepril  hcl].  MEDICATIONS:  Current Facility-Administered Medications  Medication Dose Route Frequency Provider Last Rate Last Admin   dexamethasone  (DECADRON ) tablet 4 mg  4 mg Oral Q12H Garst, Jonathan R, MD   4 mg at 08/12/23 1111   Or   dexamethasone  (DECADRON ) injection 4 mg  4 mg Intravenous Q12H Darnella Dorn SAUNDERS, MD       enoxaparin  (LOVENOX ) injection 40 mg  40 mg Subcutaneous Daily Elpidio Reyes DEL, MD   40 mg at 08/12/23 1100   folic acid  (FOLVITE ) tablet 1 mg  1 mg Oral Daily Tobie Mario GAILS, MD   1 mg at 08/12/23 1100   LORazepam  (ATIVAN ) tablet 1-4 mg  1-4 mg Oral Q1H PRN Tobie Mario GAILS, MD       Or   LORazepam  (ATIVAN ) injection 1-4 mg  1-4 mg Intravenous Q1H PRN Patel, Ekta V, MD       multivitamin with minerals tablet 1 tablet  1 tablet Oral Daily Tobie Mario GAILS, MD   1 tablet at 08/12/23 1100   polyethylene glycol (MIRALAX  / GLYCOLAX ) packet 17 g  17 g Oral Daily PRN Elpidio Reyes DEL, MD       thiamine  (VITAMIN B1) tablet 100 mg  100 mg Oral Daily Tobie Mario V, MD   100 mg at 08/12/23 1100   Or   thiamine  (VITAMIN B1) injection 100 mg  100 mg Intravenous Daily Tobie Mario GAILS, MD         LABORATORY DATA:  I have reviewed the data as listed Lab Results  Component Value Date   WBC 4.9 08/11/2023   HGB 12.8 (L) 08/11/2023   HCT 38.5 (L) 08/11/2023   MCV 83.2 08/11/2023   PLT 205 08/11/2023   Recent Labs    12/17/22 1029 08/11/23 1155  NA 140 136  K 4.9 3.8  CL 103 104  CO2 31 23  GLUCOSE 105* 113*  BUN 9 7*  CREATININE 0.85 0.79  CALCIUM  9.6 8.6*  GFRNONAA  --  >60  PROT 7.2 6.5  ALBUMIN 4.2 3.5  AST 15 17  ALT 15 15  ALKPHOS 74 56  BILITOT 1.1 1.5*    RADIOGRAPHIC STUDIES: I have personally reviewed the radiological images as listed and  agreed with the findings in the report. CT CHEST ABDOMEN PELVIS W CONTRAST Result Date: 08/11/2023 CLINICAL DATA:  Brain/CNS neoplasm, staging.  Increased weakness. * Tracking Code: BO * EXAM: CT CHEST, ABDOMEN, AND PELVIS WITH CONTRAST TECHNIQUE: Multidetector CT imaging of the chest, abdomen and pelvis was performed following the standard protocol during bolus administration of intravenous contrast. RADIATION DOSE REDUCTION: This exam was performed according to the departmental dose-optimization program which includes automated exposure control, adjustment of the mA and/or kV according to patient size and/or use of iterative reconstruction technique. CONTRAST:  75mL OMNIPAQUE  IOHEXOL  350 MG/ML SOLN COMPARISON:  None Available. FINDINGS: CT CHEST FINDINGS Cardiovascular: Normal cardiac size. No pericardial effusion. No aortic aneurysm. There are coronary artery calcifications, in keeping with coronary artery disease. There are also mild peripheral atherosclerotic vascular calcifications of thoracic aorta and its major branches. Mediastinum/Nodes: Visualized thyroid  gland appears grossly unremarkable. No solid / cystic mediastinal masses. The esophagus is nondistended precluding optimal assessment. There are few mildly prominent mediastinal and hilar lymph nodes, which are indeterminate in etiology. No axillary lymphadenopathy by size criteria. Lungs/Pleura: The central tracheo-bronchial tree is patent. Linear irregular area of scarring noted in the right lung apex. There are additional patchy areas of linear, plate-like atelectasis and/or scarring throughout bilateral lungs. No mass or consolidation. No pleural effusion or pneumothorax. There multiple calcified and noncalcified nodules throughout bilateral lungs (marked with electronic arrow sign on series 2006), with largest in the right lung lower lobe measuring 7 x 7 mm. In the absence of prior imaging and in this patient with history of malignancy, these are suspicious. Short-term follow-up examination in 6-12 weeks is recommended to evaluate for the growth pattern. Musculoskeletal: The visualized soft  tissues of the chest wall are grossly unremarkable. No suspicious osseous lesions. There are mild multilevel degenerative changes in the visualized spine. CT ABDOMEN PELVIS FINDINGS Hepatobiliary: The liver is normal in size. Non-cirrhotic configuration. No suspicious mass. No intrahepatic or extrahepatic bile duct dilation. No calcified gallstones. Normal gallbladder wall thickness. No pericholecystic inflammatory changes. Pancreas: Unremarkable. No pancreatic ductal dilatation or surrounding inflammatory changes. Spleen: Within normal limits. No focal lesion. Adrenals/Urinary Tract: Adrenal glands are unremarkable. No suspicious renal mass. There are multiple scattered simple cysts throughout bilateral kidneys with largest arising from the right kidney lower pole measuring up to 2.7 x 3.2 cm. No obstructive uropathy on either side. Evaluation for nonobstructing renal calculi is limited due to excreted contrast in the collecting system. Unremarkable urinary bladder. Stomach/Bowel: There is a small sliding hiatal hernia. No disproportionate dilation of the small or large bowel loops. The appendix was not visualized; however there is no acute inflammatory process in the right lower quadrant. There are several loops of small bowel in the left abdomen and right lower quadrant (marked with electronic arrow sign on series 5) exhibiting mild-to-moderate circumferential wall thickening with perienteric fat stranding and prominence of fossa recta, compatible with enteritis, most likely infective/inflammatory in etiology. Vascular/Lymphatic: No ascites or pneumoperitoneum. No abdominal or pelvic lymphadenopathy, by size criteria. No aneurysmal dilation of the major abdominal arteries. There are mild peripheral atherosclerotic vascular calcifications of the aorta and its major branches. Reproductive: Normal size prostate. Symmetric seminal vesicles. Other: There are fat containing umbilical and bilateral inguinal hernias. The  soft tissues and abdominal wall are otherwise unremarkable. Musculoskeletal: No suspicious osseous lesions. There is a relatively well-defined 1.2 x 1.7 cm lytic lesion in the upper portion of S1 vertebrae, incompletely characterized on the current exam but favored benign/degenerative  in etiology. There are mild multilevel degenerative changes in the visualized spine. IMPRESSION: 1. There are several loops of small bowel in the left abdomen and right lower quadrant exhibiting mild-to-moderate circumferential wall thickening with perienteric fat stranding and prominence of vasa recta, compatible with enteritis, most likely infective/inflammatory in etiology. 2. There are multiple calcified and noncalcified nodules throughout bilateral lungs, with largest in the right lung lower lobe measuring 7 x 7 mm. In the absence of prior imaging and in this patient with history of malignancy, these are suspicious. Short-term follow-up examination in 6-12 weeks is recommended to evaluate for the growth pattern. 3. No metastatic disease identified within the abdomen or pelvis. 4. Multiple other nonacute observations, as described above. Aortic Atherosclerosis (ICD10-I70.0). Electronically Signed   By: Ree Molt M.D.   On: 08/11/2023 16:26   DG Chest Portable 1 View Result Date: 08/11/2023 CLINICAL DATA:  Several day history of increased dizziness EXAM: PORTABLE CHEST 1 VIEW COMPARISON:  Chest radiograph dated 12/08/2019 FINDINGS: Normal lung volumes. No focal consolidations. Costophrenic angles are not entirely included within the field of view. No definite pleural effusion or pneumothorax. The heart size and mediastinal contours are within normal limits. No acute osseous abnormality. IMPRESSION: No acute disease. Electronically Signed   By: Limin  Xu M.D.   On: 08/11/2023 15:31   CT ANGIO HEAD NECK W WO CM Result Date: 08/11/2023 CLINICAL DATA:  Stroke/TIA, determine embolic source EXAM: CT ANGIOGRAPHY HEAD AND NECK  WITH AND WITHOUT CONTRAST TECHNIQUE: Multidetector CT imaging of the head and neck was performed using the standard protocol during bolus administration of intravenous contrast. Multiplanar CT image reconstructions and MIPs were obtained to evaluate the vascular anatomy. Carotid stenosis measurements (when applicable) are obtained utilizing NASCET criteria, using the distal internal carotid diameter as the denominator. RADIATION DOSE REDUCTION: This exam was performed according to the departmental dose-optimization program which includes automated exposure control, adjustment of the mA and/or kV according to patient size and/or use of iterative reconstruction technique. CONTRAST:  75mL OMNIPAQUE  IOHEXOL  350 MG/ML SOLN COMPARISON:  None Available. FINDINGS: CT HEAD FINDINGS Brain: There is a mixed solid and cystic mass present within the left parietal lobe, measuring approximately 4.6 x 3.0 x 4.0 cm. The solid component is relatively dense to cerebral cortex. There is mild surrounding edema, but there is no shift of the midline structures. There is generalized cerebral volume loss and mild to moderate diffuse cerebral white matter disease. Vascular: Moderate calcific atheromatous disease. Skull: No osseous lesions. Sinuses/Orbits: Moderate mucosal disease within the frontal, ethmoid and maxillary sinuses. Status post left lens replacement. Other: None. Review of the MIP images confirms the above findings CTA NECK FINDINGS Aortic arch: Mild calcific atheromatous disease. Right carotid system: The common carotid artery is normal in caliber. There is minimal calcific plaque within the carotid bulb and origin of the internal carotid artery, but no luminal stenosis. Left carotid system: There is calcific and noncalcific plaque present within the proximal internal carotid artery with approximately 50% luminal stenosis. The remainder of the cervical segment is normal in caliber. Vertebral arteries: The vertebral arteries  are codominant and normal in caliber. Skeleton: Mild multilevel degenerative disc disease. Other neck: None. Upper chest: There are some streaky opacities present within the right lung apex. There some hazy dependent opacities within the upper lobes bilaterally. There is a calcified granuloma also present posterior medially within the left upper lobe. Review of the MIP images confirms the above findings CTA HEAD FINDINGS Anterior circulation:  There is mild calcific atheromatous disease within the carotid siphons but no significant stenosis. The anterior and middle cerebral arteries are normal in caliber and unremarkable in appearance. Posterior circulation: The vertebrobasilar system is unremarkable. There is fetal origin of the left posterior cerebral artery. The posterior cerebral arteries and cerebellar arteries are normal in caliber. Venous sinuses: Widely patent. Anatomic variants: Fetal origin of the left posterior cerebral artery. Review of the MIP images confirms the above findings IMPRESSION: 1. 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 2. Moderate calcific and noncalcific plaque within the proximal left internal carotid artery, with estimated 50% luminal stenosis. These results were called by telephone at the time of interpretation on 08/11/2023 at 2:31 pm to provider Dr. CARON SALT , who verbally acknowledged these results. Electronically Signed   By: Evalene Coho M.D.   On: 08/11/2023 14:37   MR BRAIN W WO CONTRAST Result Date: 08/11/2023 CLINICAL DATA:  Provided history: Neuro deficit, acute, stroke suspected. EXAM: MRI HEAD WITHOUT AND WITH CONTRAST TECHNIQUE: Multiplanar, multiecho pulse sequences of the brain and surrounding structures were obtained without and with intravenous contrast. CONTRAST:  8mL GADAVIST  GADOBUTROL  1 MMOL/ML IV SOLN COMPARISON:  None. FINDINGS: Brain: Generalized cerebral atrophy. 4.3 x 3.1 x 4.8 cm mass at the junction of the frontal  and parietal lobes on the left (with involvement of the perirolandic region). The mass has peripheral enhancement and appears centrally necrotic. The mass extends to abut, and may involve, the overlying dura (for instance as seen on series 11, image 10). Susceptibility-weighted signal loss associated with this lesion (greatest superiorly), likely reflecting hemosiderin deposition. Moderate surrounding vasogenic edema. Local mass effect with partial effacement of the posterior body and atrium of the left lateral ventricle. No midline shift. Additional 5 mm enhancing lesion at the junction of the posterior hippocampus and crus of fornix on the left (series 10, image 39) (for instance as seen on series 11, image 13). Subtle edema also present at this site (series 6, image 14). Background mild multifocal T2 FLAIR hyperintense signal abnormality within the cerebral white matter, nonspecific but compatible chronic small vessel ischemic disease. Several nonspecific chronic microhemorrhages scattered within the supratentorial and infratentorial brain. There is no acute infarct. No extra-axial fluid collection. No midline shift. Vascular: Maintained flow voids within the proximal large arterial vessels. Skull and upper cervical spine: No focal worrisome marrow lesion. Sinuses/Orbits: No mass or acute finding within the imaged orbits. Prior left ocular lens replacement. Moderate right ethmoid sinusitis. 20 mm mucous retention cyst, and minimal background mucosal thickening, within the left maxillary sinus. Mild bilateral ethmoid sinusitis. Mild mucosal thickening within the right frontal sinus. Severe left frontal sinusitis (with complete sinus opacification). IMPRESSION: 1. 4.3 x 4.8 cm peripherally enhancing and centrally necrotic mass at the junction of the frontal and parietal lobes on the left, as described. Moderate surrounding vasogenic edema. Local mass effect with partial effacement of the left lateral ventricle. No  midline shift. 2. Additional 5 mm enhancing lesion at the junction of the posterior hippocampus and crus of fornix on the left. 3. The above described lesions are most suspicious for intracranial metastases. However, a multicentric high-grade primary CNS neoplasm (such as glioblastoma multiforme) could also have this imaging presentation. 4. Several nonspecific chronic microhemorrhages scattered within the supratentorial and infratentorial brain. 5. Background parenchymal atrophy and chronic small vessel ischemic disease. 6. Paranasal sinus disease as described. Electronically Signed   By: Rockey Childs D.O.  On: 08/11/2023 14:24     The total time spent in the appointment was 55 minutes encounter with patients including review of chart and various tests results, discussions about plan of care and coordination of care plan   All questions were answered. The patient knows to call the clinic with any problems, questions or concerns. No barriers to learning was detected.  Olam JINNY Brunner, NP 7/29/20252:12 PM   Attending Note  I personally saw the patient, reviewed the chart and examined the patient. The plan of care was discussed with the patient and the admitting team. I agree with the assessment and plan as documented above. Thank you very much for the consultation. Brain mass with Lung nodules: 4.8 cm necrotic mass left fronto-parietal area Lung nodules bilateral lungs 7 mm: Bronchoscopy planned for 7/30 Steroids Once we have tissue diagnosis, additional recommendations and likely follow up with a different oncology specialist may be requested. Rad Onc will be following the patient as well.

## 2023-08-12 NOTE — Plan of Care (Signed)
  Problem: Education: Goal: Knowledge of disease or condition will improve Outcome: Progressing   Problem: Ischemic Stroke/TIA Tissue Perfusion: Goal: Complications of ischemic stroke/TIA will be minimized Outcome: Progressing   Problem: Health Behavior/Discharge Planning: Goal: Ability to manage health-related needs will improve Outcome: Progressing   Problem: Clinical Measurements: Goal: Will remain free from infection Outcome: Progressing   Problem: Safety: Goal: Ability to remain free from injury will improve Outcome: Progressing   Problem: Skin Integrity: Goal: Risk for impaired skin integrity will decrease Outcome: Progressing

## 2023-08-12 NOTE — Evaluation (Signed)
 Occupational Therapy Evaluation Patient Details Name: Noah King MRN: [id number] DOB: 1945/07/25 Today's Date: 08/12/2023   History of Present Illness   78 y.o. male presents to [facility] hospital on 08/11/2023 with R hemiparesis and numbness. 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 PMH includes HTN.     Clinical Impressions Pt ind at baseline with ADLs and has been using a rollator x1 month due to BLE weakness. Pt lives alone. Pt currently with R deficits, needing min-mod A for ADLs, min A for bed mobility and mod A for transfers with RW. Pt with heavy R lateral lean in standing needing mod-max A to remain upright. Pt able to lateral scoot transfer to chair with minA. Pt and perform seated grooming task. Pt presenting with impairments listed below, will follow acutely. Patient will benefit from intensive inpatient follow-up therapy, >3 hours/day to maximize safety/ind with ADL/functional mobility.      If plan is discharge home, recommend the following:   A lot of help with walking and/or transfers;A lot of help with bathing/dressing/bathroom;Assistance with cooking/housework;Assist for transportation     Functional Status Assessment   Patient has had a recent decline in their functional status and demonstrates the ability to make significant improvements in function in a reasonable and predictable amount of time.     Equipment Recommendations   Other (comment) (defer)     Recommendations for Other Services   PT consult;Rehab consult     Precautions/Restrictions   Precautions Precautions: Fall Restrictions Weight Bearing Restrictions Per Provider Order: No     Mobility Bed Mobility Overal bed mobility: Needs Assistance Bed Mobility: Supine to Sit, Sit to Supine     Supine to sit: Min assist     General bed mobility comments: assist for RLE    Transfers Overall transfer level: Needs assistance Equipment used:  Rolling walker (2 wheels) Transfers: Sit to/from Stand Sit to Stand: Mod assist           General transfer comment: heavy R lateral lean in standing      Balance Overall balance assessment: Needs assistance Sitting-balance support: No upper extremity supported, Feet supported Sitting balance-Leahy Scale: Good     Standing balance support: Bilateral upper extremity supported, Reliant on assistive device for balance Standing balance-Leahy Scale: Poor                             ADL either performed or assessed with clinical judgement   ADL Overall ADL's : Needs assistance/impaired Eating/Feeding: Set up;Sitting   Grooming: Set up;Sitting   Upper Body Bathing: Minimal assistance;Sitting   Lower Body Bathing: Moderate assistance;Sitting/lateral leans   Upper Body Dressing : Minimal assistance;Sitting   Lower Body Dressing: Moderate assistance;Sitting/lateral leans   Toilet Transfer: Moderate assistance   Toileting- Clothing Manipulation and Hygiene: Moderate assistance       Functional mobility during ADLs: Moderate assistance;Rolling walker (2 wheels)       Vision Baseline Vision/History: 1 Wears glasses Vision Assessment?: No apparent visual deficits     Perception Perception: Not tested       Praxis Praxis: Not tested       Pertinent Vitals/Pain Pain Assessment Pain Assessment: No/denies pain     Extremity/Trunk Assessment Upper Extremity Assessment Upper Extremity Assessment: Right hand dominant RUE Deficits / Details: +drift, slightly weaker compared to LUE, impaired coordination   Lower Extremity Assessment Lower Extremity Assessment: Defer to PT  evaluation   Cervical / Trunk Assessment Cervical / Trunk Assessment: Normal   Communication Communication Communication: No apparent difficulties   Cognition Arousal: Alert Behavior During Therapy: WFL for tasks assessed/performed Cognition: No apparent impairments                                Following commands: Intact       Cueing  General Comments   Cueing Techniques: Verbal cues  VSS   Exercises     Shoulder Instructions      Home Living Family/patient expects to be discharged to:: Private residence Living Arrangements: Alone Available Help at Discharge: Friend(s);Available PRN/intermittently Type of Home: House Home Access: Stairs to enter [employer] of Steps: 2 Entrance Stairs-Rails: None Home Layout: One level     Bathroom Shower/Tub: Producer, television/film/video: Standard Bathroom Accessibility: No   Home Equipment: Rollator (4 wheels);Cane - single point;Shower seat          Prior Functioning/Environment Prior Level of Function : Driving;Independent/Modified Independent (hasnt driven in the past month)             Mobility Comments: pt has been primarily ambulating with a rollator for the last month due to LE weakness, prior was independent ADLs Comments: pt reports incr challenge with ADLs (bathing specifically), manages own meds, cooks    OT Problem List: Decreased strength;Decreased range of motion;Decreased activity tolerance;Impaired balance (sitting and/or standing);Decreased coordination;Decreased safety awareness   OT Treatment/Interventions: Self-care/ADL training;Therapeutic exercise;Energy conservation;DME and/or AE instruction;Therapeutic activities;Balance training;Patient/family education      OT Goals(Current goals can be found in the care plan section)   Acute Rehab OT Goals Patient Stated Goal: none stated OT Goal Formulation: With patient Time For Goal Achievement: 08/26/23 Potential to Achieve Goals: Good ADL Goals Pt Will Perform Upper Body Dressing: with min assist;sitting Pt Will Perform Lower Body Dressing: with min assist;sitting/lateral leans;sit to/from stand Pt Will Transfer to Toilet: with min assist;regular height toilet Additional ADL Goal #1: pt will  demonstrate fair standing balance in prep for OOB ADLs   OT Frequency:  Min 2X/week    Co-evaluation              AM-PAC OT 6 Clicks Daily Activity     Outcome Measure Help from another person eating meals?: A Little Help from another person taking care of personal grooming?: A Little Help from another person toileting, which includes using toliet, bedpan, or urinal?: A Lot Help from another person bathing (including washing, rinsing, drying)?: A Lot Help from another person to put on and taking off regular upper body clothing?: A Little Help from another person to put on and taking off regular lower body clothing?: A Lot 6 Click Score: 15   End of Session Equipment Utilized During Treatment: Gait belt;Rolling walker (2 wheels) Nurse Communication: Mobility status  Activity Tolerance: Patient tolerated treatment well Patient left: in chair;with call bell/phone within reach;with chair alarm set  OT Visit Diagnosis: Unsteadiness on feet (R26.81);Other abnormalities of gait and mobility (R26.89);Muscle weakness (generalized) (M62.81)                Time: 0929-1000 OT Time Calculation (min): 31 min Charges:  OT General Charges $OT Visit: 1 Visit OT Evaluation $OT Eval Moderate Complexity: 1 Mod OT Treatments $Self Care/Home Management : 8-22 mins  Cosme Jacob K, OTD, OTR/L SecureChat Preferred Acute Rehab [phone]   Laneta MARLA Pereyra  08/12/2023, 11:10 AM

## 2023-08-12 NOTE — Progress Notes (Signed)
 PROGRESS NOTE  Noah King  FMW:[id number] DOB: 08-16-1945 DOA: 08/11/2023 PCP: Merna Huxley, NP  Consultants  Brief Narrative: Noah King is a 78 y.o. M with PMH significant for Hypothyroidism, HTN, polycythemia vera, ETOH and tobacco abuse who initially presented to the ED 7/28 with R sided and generalized weakness with gait disturbance.  This has been going on for 2 years but worsening over the past 2 weeks to the point where he is dependent on a walker. he was found to have a peripherally enhancing necrotic mass at the junction of the L frontal and parietal lobes with local mass effect with 5mm enhancing lesion of the posterior hippocampus.  CTA chest revealed multiple calcified and non-calcified nodules throughout the bilateral lungs -largest 7 mm nodule at the right base close to the diaphragm and right hilar lymphadenopathy.    Assessment & Plan: Right sided weakness/new brain mass: - Deemed most likely to be metastatic.  Possibility of primary and lung based on CT chest. - Pulmonology consulted for possible bronchoscopy and biopsy, plan for 7/30. - Radiation oncology and oncology have been consulted. - Plan for craniotomy for tumor resection 8/7 per neurosurgery. - Per neurosurgery, patient has been started on Decadron  4 mg every 6 hours. - Lovenox  for DVT prophylaxis.  Will hold tonight in light of upcoming procedures, restart after bronch tomorrow 7/30  Essential hypertension  History of the same on outpatient basis. - Restarting home verapamil .  He is also on losartan  outpatient but we will hold this  GERD: - Takes Prilosec on outpatient basis. - Start Protonix  here  Alcohol abuse: Patient drinks beer on a daily basis, no history of withdrawal symptoms. CIWA protocol and thiamine . -No issues thus far.   DVT prophylaxis:  enoxaparin  (LOVENOX ) injection 40 mg Start: 08/12/23 1000 **Holding Lovenox  midnight 7/29 in light of bronch 7/30.    Code Status:   Code  Status: Not on file Level of care: Telemetry Medical Status is: Inpatient  Consults called: Neurosurgery, pulmonology, oncology, radiation oncology  Subjective: Patient without complaints today.  Feels fairly well.  Objective: Vitals:   08/12/23 0417 08/12/23 0827 08/12/23 1134 08/12/23 1536  BP: (!) 155/73 (!) 152/85 (!) 149/70 (!) 157/83  Pulse: 60 89 93 80  Resp: 18 20 20 20   Temp: 98 F (36.7 C) 97.9 F (36.6 C) 98.4 F (36.9 C) 98.1 F (36.7 C)  TempSrc: Oral Oral Oral Oral  SpO2: 97% 98% 97% 96%    Intake/Output Summary (Last 24 hours) at 08/12/2023 1628 Last data filed at 08/12/2023 1516 Gross per 24 hour  Intake 120 ml  Output 650 ml  Net -530 ml   There were no vitals filed for this visit. There is no height or weight on file to calculate BMI.  Gen: 78 y.o. male in no apparent distress.  Nontoxic Pulm: Non-labored breathing.  Clear to auscultation bilaterally.  CV: Regular rate and rhythm. No murmur, rub, or gallop. No JVD GI: Abdomen soft, non-tender, non-distended, with normoactive bowel sounds. No organomegaly or masses felt. Ext: Warm, no deformities, no pedal edema Skin: No rashes, lesions no ulcers Neuro: Alert and oriented.  Some minor contraction of left hand which is chronic secondary to surgery for Dupuytren's contracture. Psych: Calm  Judgement and insight appear normal. Mood & affect appropriate.     I have personally reviewed the following labs and images: CBC: Recent Labs  Lab 08/11/23 1155  WBC 4.9  NEUTROABS 3.0  HGB 12.8*  HCT 38.5*  MCV  83.2  PLT 205   BMP &GFR Recent Labs  Lab 08/11/23 1155  NA 136  K 3.8  CL 104  CO2 23  GLUCOSE 113*  BUN 7*  CREATININE 0.79  CALCIUM  8.6*   CrCl cannot be calculated (Unknown ideal weight.). Liver & Pancreas: Recent Labs  Lab 08/11/23 1155  AST 17  ALT 15  ALKPHOS 56  BILITOT 1.5*  PROT 6.5  ALBUMIN 3.5   No results for input(s): LIPASE, AMYLASE in the last 168 hours. No  results for input(s): AMMONIA in the last 168 hours. Diabetic: Recent Labs    08/11/23 1155  HGBA1C 4.7*   No results for input(s): GLUCAP in the last 168 hours. Cardiac Enzymes: No results for input(s): CKTOTAL, CKMB, CKMBINDEX, TROPONINI in the last 168 hours. No results for input(s): PROBNP in the last 8760 hours. Coagulation Profile: Recent Labs  Lab 08/11/23 1538  INR 1.1   Thyroid  Function Tests: No results for input(s): TSH, T4TOTAL, FREET4, T3FREE, THYROIDAB in the last 72 hours. Lipid Profile: Recent Labs    08/11/23 1155 08/12/23 0442  CHOL 105 119  HDL 61 71  LDLCALC 36 44  TRIG 39 18  CHOLHDL 1.7 1.7   Anemia Panel: No results for input(s): VITAMINB12, FOLATE, FERRITIN, TIBC, IRON, RETICCTPCT in the last 72 hours. Urine analysis:    Component Value Date/Time   COLORURINE YELLOW 08/11/2023 1155   APPEARANCEUR CLEAR 08/11/2023 1155   LABSPEC >1.046 (H) 08/11/2023 1155   PHURINE 7.0 08/11/2023 1155   GLUCOSEU NEGATIVE 08/11/2023 1155   HGBUR NEGATIVE 08/11/2023 1155   HGBUR negative 05/18/2009 0753   BILIRUBINUR NEGATIVE 08/11/2023 1155   BILIRUBINUR 1+ 11/09/2015 1136   KETONESUR 20 (A) 08/11/2023 1155   PROTEINUR NEGATIVE 08/11/2023 1155   UROBILINOGEN 1.0 11/09/2015 1136   UROBILINOGEN 0.2 05/18/2009 0753   NITRITE NEGATIVE 08/11/2023 1155   LEUKOCYTESUR NEGATIVE 08/11/2023 1155   Sepsis Labs: Invalid input(s): PROCALCITONIN, LACTICIDVEN  Microbiology: No results found for this or any previous visit (from the past 240 hours).  Radiology Studies: No results found.  Scheduled Meds:  dexamethasone   4 mg Oral Q12H   Or   dexamethasone  (DECADRON ) injection  4 mg Intravenous Q12H   enoxaparin  (LOVENOX ) injection  40 mg Subcutaneous Daily   folic acid   1 mg Oral Daily   multivitamin with minerals  1 tablet Oral Daily   thiamine   100 mg Oral Daily   Or   thiamine   100 mg Intravenous Daily   Continuous  Infusions:   LOS: 1 day   35 minutes with more than 50% spent in reviewing records, counseling patient/family and coordinating care.  Reyes VEAR Gaw, MD [facility] [email or web address] 08/12/2023, 4:28 PM

## 2023-08-12 NOTE — Evaluation (Signed)
 Speech Language Pathology Evaluation Patient Details Name: Noah King MRN: [id number] DOB: September 20, 1945 Today's Date: 08/12/2023 Time: 1251-1310 SLP Time Calculation (min) (ACUTE ONLY): 19 min  Problem List:  Patient Active Problem List   Diagnosis Date Noted   Right sided weakness 08/11/2023   Herpes zoster 12/13/2009   Polycythemia vera (HCC) 04/16/2007   Contracture of palmar fascia 04/16/2007   Hypothyroidism 08/29/2006   Hyperlipidemia 08/29/2006   Essential hypertension 08/29/2006   Past Medical History:  Past Medical History:  Diagnosis Date   Allergy    Cataract    left  extraction- right eye forming   Dupuytren's contracture    GERD (gastroesophageal reflux disease)    Hiatal hernia    past hx-  suspect at one time   Hypertension    Polycythemia    Past Surgical History:  Past Surgical History:  Procedure Laterality Date   cataract extraction left eye     COLONOSCOPY     DUPUYTREN CONTRACTURE RELEASE Left    POLYPECTOMY     rt knee arthroscopic     HPI:  78 y.o. male presents to [facility] hospital on 08/11/2023 with R hemiparesis and numbness. 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 PMH includes HTN.   Assessment / Plan / Recommendation Clinical Impression  Pt and friend reported some memory difficulty recalling the different providers. He was cooperative, alert with adequate use of language in conversation with intelligible speech. He scored a 19/30 on the SLUMS with deficits primarily in the area of word recall, recall of questions during assessment and minimally decreased problem solving (inaccurate for length of hands during clock drawing). Functionally he did recall plan to have surgery next week. He stated he may need to stay in hospital until then due to uncertainty of assist if discharged prior to surgery. Pt was agreeable to continued ST for cognition.    SLP Assessment  SLP Recommendation/Assessment: Patient needs  continued Speech Language Pathology Services SLP Visit Diagnosis: Cognitive communication deficit (R41.841)     Assistance Recommended at Discharge  Intermittent Supervision/Assistance  Functional Status Assessment Patient has had a recent decline in their functional status and demonstrates the ability to make significant improvements in function in a reasonable and predictable amount of time.  Frequency and Duration min 2x/week  2 weeks      SLP Evaluation Cognition  Overall Cognitive Status: Impaired/Different from baseline Arousal/Alertness: Awake/alert Orientation Level: Oriented X4 Year: 2025 Day of Week: Correct Attention: Sustained Sustained Attention: Appears intact Memory: Impaired (0/5 words recalled) Memory Impairment: Retrieval deficit Awareness: Appears intact Problem Solving: Impaired Problem Solving Impairment: Functional basic Safety/Judgment: Appears intact       Comprehension  Auditory Comprehension Overall Auditory Comprehension: Appears within functional limits for tasks assessed Visual Recognition/Discrimination Discrimination: Not tested Reading Comprehension Reading Status: Not tested    Expression Expression Primary Mode of Expression: Verbal Verbal Expression Overall Verbal Expression: Appears within functional limits for tasks assessed Initiation: No impairment Level of Generative/Spontaneous Verbalization: Conversation Repetition:  (NT) Naming: Impairment Divergent:  (minimal, recalled 14 animals one min) Pragmatics: No impairment Written Expression Written Expression: Not tested   Oral / Motor  Oral Motor/Sensory Function Overall Oral Motor/Sensory Function: Within functional limits Motor Speech Overall Motor Speech: Appears within functional limits for tasks assessed Respiration: Within functional limits Phonation: Normal Resonance: Within functional limits Articulation: Within functional limitis Intelligibility:  Intelligible Motor Planning: Within functional limits Motor Speech Errors: Not applicable  [name] 08/12/2023, 2:27 PM

## 2023-08-12 NOTE — Progress Notes (Signed)
 I spoke with the patient. His hemocult test was negative, and we discussed the rationale to continue aiming to offer [facility] treatment and coordinating the logistics of this therapy while awaiting his EBUS cytology. He is in agreement. He met with Dr. Gudena today as well as Dr. Jude who will perform tomorrow's procedure. I will check back in with him tomorrow.      Donald KYM Husband, PAC

## 2023-08-12 NOTE — Progress Notes (Signed)
 Physical Therapy Treatment Patient Details Name: Noah King MRN: [id number] DOB: [birth year] Today's Date: 08/12/2023   History of Present Illness 78 y.o. male presents to [facility] hospital on 08/11/2023 with R hemiparesis and numbness. 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 PMH includes HTN.    PT Comments  Pt received sitting in the recliner and agreeable to session. Pt demonstrates R lateral lean upon initial stand, however is able to correct and maintain midline during the remainder of the session. Pt demonstrates RLE tremors with elevation during first marches, however improves with progressed mobility. Pt demonstrates impaired RLE coordination and possibly some increased tone during ambulation causing increased instability. Pt able have BM and perform pericare without assist. Pt continues to benefit from PT services to progress toward functional mobility goals.    If plan is discharge home, recommend the following: A little help with walking and/or transfers;A little help with bathing/dressing/bathroom;Assistance with cooking/housework;Assist for transportation;Help with stairs or ramp for entrance   Can travel by private vehicle        Equipment Recommendations  None recommended by PT    Recommendations for Other Services       Precautions / Restrictions Precautions Precautions: Fall Restrictions Weight Bearing Restrictions Per Provider Order: No     Mobility  Bed Mobility Overal bed mobility: Needs Assistance Bed Mobility: Sit to Supine       Sit to supine: Contact guard assist, Used rails        Transfers Overall transfer level: Needs assistance Equipment used: Rolling walker (2 wheels) Transfers: Sit to/from Stand Sit to Stand: Mod assist, Min assist           General transfer comment: From recliner with mod A for power up and stability due to R lateral lean initially. from low toilet seat with min A. Cues for hand  placement    Ambulation/Gait Ambulation/Gait assistance: Min assist Gait Distance (Feet): 10 Feet (+10, +20) Assistive device: Rolling walker (2 wheels) Gait Pattern/deviations: Ataxic, Trunk flexed, Step-through pattern, Decreased step length - right, Decreased stride length Gait velocity: reduced     General Gait Details: Pt demonstrates impaired RLE control with steppage gait and decreased step length, which appears to be due to increased muscle tone. Pt able to increase step length with frequent cues for foot placement. Cues for upright posture and RW proximity. min A for RW management and balance   Stairs             Wheelchair Mobility     Tilt Bed    Modified Rankin (Stroke Patients Only)       Balance Overall balance assessment: Needs assistance Sitting-balance support: No upper extremity supported, Feet supported Sitting balance-Leahy Scale: Good Sitting balance - Comments: sitting EOB   Standing balance support: Bilateral upper extremity supported, Reliant on assistive device for balance, During functional activity Standing balance-Leahy Scale: Poor Standing balance comment: with RW support                            Communication Communication Communication: No apparent difficulties  Cognition Arousal: Alert Behavior During Therapy: WFL for tasks assessed/performed   PT - Cognitive impairments: Memory                       PT - Cognition Comments: Pt reports some difficulty remembering PLOF Following commands: Intact      Cueing  Cueing Techniques: Verbal cues  Exercises General Exercises - Lower Extremity Hip Flexion/Marching: AROM, Standing, Both, 10 reps (x2)    General Comments        Pertinent Vitals/Pain Pain Assessment Pain Assessment: No/denies pain     PT Goals (current goals can now be found in the care plan section) Acute Rehab PT Goals Patient Stated Goal: to return to independence PT Goal Formulation:  With patient Time For Goal Achievement: 08/25/23 Progress towards PT goals: Progressing toward goals    Frequency    Min 2X/week       AM-PAC PT 6 Clicks Mobility   Outcome Measure  Help needed turning from your back to your side while in a flat bed without using bedrails?: A Little Help needed moving from lying on your back to sitting on the side of a flat bed without using bedrails?: A Little Help needed moving to and from a bed to a chair (including a wheelchair)?: A Little Help needed standing up from a chair using your arms (e.g., wheelchair or bedside chair)?: A Little Help needed to walk in hospital room?: A Little Help needed climbing 3-5 steps with a railing? : A Lot 6 Click Score: 17    End of Session Equipment Utilized During Treatment: Gait belt Activity Tolerance: Patient tolerated treatment well Patient left: in bed;with call bell/phone within reach;with family/visitor present;with bed alarm set Nurse Communication: Mobility status PT Visit Diagnosis: Other abnormalities of gait and mobility (R26.89);Muscle weakness (generalized) (M62.81);Other symptoms and signs involving the nervous system (R29.898)     Time: 8547-8479 PT Time Calculation (min) (ACUTE ONLY): 28 min  Charges:    $Gait Training: 8-22 mins $Therapeutic Exercise: 8-22 mins PT General Charges $$ ACUTE PT VISIT: 1 Visit                     Darryle George, PTA Acute Rehabilitation Services Secure Chat Preferred  Office:([phone]    Darryle George 08/12/2023, 4:04 PM

## 2023-08-12 NOTE — Progress Notes (Signed)
 Received pt from ED via stretcher, AOX4, follows commands, hooked to telemonitoring, oriented to room ans surroundings, keep call bell reach

## 2023-08-13 ENCOUNTER — Inpatient Hospital Stay (HOSPITAL_COMMUNITY): Admitting: Registered Nurse

## 2023-08-13 ENCOUNTER — Encounter (HOSPITAL_COMMUNITY): Payer: Self-pay | Admitting: Internal Medicine

## 2023-08-13 ENCOUNTER — Encounter (HOSPITAL_COMMUNITY)
Admission: EM | Disposition: A | Discharge status: Home Health | Payer: Self-pay | Source: Home / Self Care | Attending: Internal Medicine

## 2023-08-13 DIAGNOSIS — Z87891 Personal history of nicotine dependence: Secondary | ICD-10-CM | POA: Diagnosis not present

## 2023-08-13 DIAGNOSIS — G9389 Other specified disorders of brain: Secondary | ICD-10-CM | POA: Diagnosis not present

## 2023-08-13 DIAGNOSIS — I10 Essential (primary) hypertension: Secondary | ICD-10-CM

## 2023-08-13 DIAGNOSIS — R590 Localized enlarged lymph nodes: Secondary | ICD-10-CM | POA: Diagnosis not present

## 2023-08-13 DIAGNOSIS — E039 Hypothyroidism, unspecified: Secondary | ICD-10-CM | POA: Diagnosis not present

## 2023-08-13 DIAGNOSIS — R531 Weakness: Secondary | ICD-10-CM | POA: Diagnosis not present

## 2023-08-13 HISTORY — PX: VIDEO BRONCHOSCOPY WITH ENDOBRONCHIAL ULTRASOUND: SHX6177

## 2023-08-13 MED ORDER — FENTANYL CITRATE (PF) 100 MCG/2ML IJ SOLN
INTRAMUSCULAR | Status: DC | PRN
  Administered 2023-08-13 (×2): 50 ug via INTRAVENOUS

## 2023-08-13 MED ORDER — ROCURONIUM BROMIDE 10 MG/ML (PF) SYRINGE
PREFILLED_SYRINGE | INTRAVENOUS | Status: DC | PRN
  Administered 2023-08-13: 40 mg via INTRAVENOUS

## 2023-08-13 MED ORDER — LACTATED RINGERS IV SOLN
INTRAVENOUS | Status: DC | PRN
Start: 2023-08-13 — End: 2023-08-13

## 2023-08-13 MED ORDER — CHLORHEXIDINE GLUCONATE 0.12 % MT SOLN
15.0000 mL | Freq: Once | OROMUCOSAL | Status: AC
  Administered 2023-08-13: 15 mL via OROMUCOSAL

## 2023-08-13 MED ORDER — PHENYLEPHRINE 80 MCG/ML (10ML) SYRINGE FOR IV PUSH (FOR BLOOD PRESSURE SUPPORT)
PREFILLED_SYRINGE | INTRAVENOUS | Status: DC | PRN
  Administered 2023-08-13: 120 ug via INTRAVENOUS
  Administered 2023-08-13: 80 ug via INTRAVENOUS

## 2023-08-13 MED ORDER — FENTANYL CITRATE (PF) 100 MCG/2ML IJ SOLN
INTRAMUSCULAR | Status: AC
  Filled 2023-08-13: qty 2

## 2023-08-13 MED ORDER — SUCCINYLCHOLINE CHLORIDE 200 MG/10ML IV SOSY
PREFILLED_SYRINGE | INTRAVENOUS | Status: DC | PRN
  Administered 2023-08-13: 100 mg via INTRAVENOUS

## 2023-08-13 MED ORDER — PROPOFOL 10 MG/ML IV BOLUS
INTRAVENOUS | Status: DC | PRN
  Administered 2023-08-13: 120 mg via INTRAVENOUS

## 2023-08-13 MED ORDER — LIDOCAINE 2% (20 MG/ML) 5 ML SYRINGE
INTRAMUSCULAR | Status: DC | PRN
  Administered 2023-08-13: 100 mg via INTRAVENOUS

## 2023-08-13 MED ORDER — CHLORHEXIDINE GLUCONATE 0.12 % MT SOLN
OROMUCOSAL | Status: AC
Start: 2023-08-13 — End: 2023-08-13
  Filled 2023-08-13: qty 15

## 2023-08-13 MED ORDER — SUGAMMADEX SODIUM 200 MG/2ML IV SOLN
INTRAVENOUS | Status: DC | PRN
  Administered 2023-08-13: 200 mg via INTRAVENOUS

## 2023-08-13 MED ORDER — DEXAMETHASONE SODIUM PHOSPHATE 10 MG/ML IJ SOLN
INTRAMUSCULAR | Status: DC | PRN
  Administered 2023-08-13: 10 mg via INTRAVENOUS

## 2023-08-13 NOTE — Plan of Care (Signed)
  Problem: Self-Care: Goal: Ability to participate in self-care as condition permits will improve Outcome: Progressing Goal: Verbalization of feelings and concerns over difficulty with self-care will improve Outcome: Progressing Goal: Ability to communicate needs accurately will improve Outcome: Progressing   Problem: Clinical Measurements: Goal: Ability to maintain clinical measurements within normal limits will improve Outcome: Progressing Goal: Will remain free from infection Outcome: Progressing Goal: Diagnostic test results will improve Outcome: Progressing Goal: Respiratory complications will improve Outcome: Progressing Goal: Cardiovascular complication will be avoided Outcome: Progressing   Problem: Activity: Goal: Risk for activity intolerance will decrease Outcome: Progressing   Problem: Nutrition: Goal: Adequate nutrition will be maintained Outcome: Progressing   Problem: Elimination: Goal: Will not experience complications related to bowel motility Outcome: Progressing Goal: Will not experience complications related to urinary retention Outcome: Progressing   Problem: Pain Managment: Goal: General experience of comfort will improve and/or be controlled Outcome: Progressing   Problem: Safety: Goal: Ability to remain free from injury will improve Outcome: Progressing   Problem: Skin Integrity: Goal: Risk for impaired skin integrity will decrease Outcome: Progressing

## 2023-08-13 NOTE — Anesthesia Preprocedure Evaluation (Signed)
 Anesthesia Evaluation  Patient identified by MRN, date of birth, ID band Patient awake    Reviewed: Allergy & Precautions, NPO status , Patient's Chart, lab work & pertinent test results  History of Anesthesia Complications Negative for: history of anesthetic complications  Airway Mallampati: II  TM Distance: >3 FB Neck ROM: Full    Dental no notable dental hx. (+) Teeth Intact   Pulmonary neg pulmonary ROS, neg sleep apnea, neg COPD, Patient abstained from smoking.Not current smoker, former smoker Mass in lung, suspected primary for brain metastases (scheduled for crani next week)   Pulmonary exam normal breath sounds clear to auscultation       Cardiovascular Exercise Tolerance: Good METShypertension, Pt. on medications (-) CAD and (-) Past MI (-) dysrhythmias  Rhythm:Regular Rate:Normal - Systolic murmurs    Neuro/Psych negative neurological ROS  negative psych ROS   GI/Hepatic hiatal hernia,GERD  Medicated and Controlled,,(+)     (-) substance abuse    Endo/Other  neg diabetesHypothyroidism    Renal/GU negative Renal ROS     Musculoskeletal   Abdominal   Peds  Hematology   Anesthesia Other Findings Past Medical History: No date: Allergy No date: Cataract     Comment:  left  extraction- right eye forming No date: Dupuytren's contracture No date: GERD (gastroesophageal reflux disease) No date: Hiatal hernia     Comment:  past hx-  suspect at one time No date: Hypertension No date: Polycythemia  Reproductive/Obstetrics                              Anesthesia Physical Anesthesia Plan  ASA: 3  Anesthesia Plan: General   Post-op Pain Management:    Induction: Intravenous  PONV Risk Score and Plan: 2 and Ondansetron, Dexamethasone  and Treatment may vary due to age or medical condition  Airway Management Planned: Oral ETT  Additional Equipment: None  Intra-op Plan:    Post-operative Plan: Extubation in OR  Informed Consent: I have reviewed the patients History and Physical, chart, labs and discussed the procedure including the risks, benefits and alternatives for the proposed anesthesia with the patient or authorized representative who has indicated his/her understanding and acceptance.     Dental advisory given  Plan Discussed with: CRNA and Surgeon  Anesthesia Plan Comments: (Discussed risks of anesthesia with patient, including PONV, sore throat, lip/dental/eye damage. Rare risks discussed as well, such as cardiorespiratory and neurological sequelae, and allergic reactions. Discussed the role of CRNA in patient's perioperative care. Patient understands.)         Anesthesia Quick Evaluation

## 2023-08-13 NOTE — Progress Notes (Signed)
 Assessment/Plan: 78 y/o M on aspirin 81, remote smoking hx who presents with progressive R hemiparesis, found to have L frontoparietal cystic mass, small L hippocampal focus, and multiple lung nodules consistent with likely metastatic disease  LOS: 2 days  Decadron  4q12 Tentative plan for craniotomy for tumor resection Thursday 8/7. From nsgy standpoint, patient can discharge home and come back for surgery. Will defer to other teams [facility] for DVT chemoppx. Otherwise hold anticoagulation and antiplatelets Diet as tolerated Activity as tolerated  Subjective: Nae o/n. Radonc MRI performed yesterday  Objective: Vital signs in last 24 hours: Temp:  [97.8 F (36.6 C)-98.8 F (37.1 C)] 97.8 F (36.6 C) (07/30 0725) Pulse Rate:  [50-93] 53 (07/30 0725) Resp:  [18-20] 18 (07/30 0413) BP: (113-163)/(57-86) 163/82 (07/30 0725) SpO2:  [95 %-99 %] 99 % (07/30 0725)  Intake/Output from previous day: 07/29 0701 - 07/30 0700 In: -  Out: 1450 [Urine:1450] Intake/Output this shift: No intake/output data recorded.  Exam: GCS 4E 5V 28M  AOx3 No aphasia or dysarthria PERRL EOMI, conjugate gaze FS TM Full strength LUE and LLE RUE: 4+/5 throughout RLE: 4/5 throughout Subjective decreased sensation to light touch R hand. Otherwise intact  Lab Results: Recent Labs    08/11/23 1155  WBC 4.9  HGB 12.8*  HCT 38.5*  PLT 205   BMET Recent Labs    08/11/23 1155  NA 136  K 3.8  CL 104  CO2 23  GLUCOSE 113*  BUN 7*  CREATININE 0.79  CALCIUM  8.6*    Studies/Results: MR BRAIN W WO CONTRAST Result Date: 08/13/2023 CLINICAL DATA:  Brain/CNS neoplasm, staging preoperative SRS treatment planning EXAM: MRI HEAD WITHOUT AND WITH CONTRAST TECHNIQUE: Multiplanar, multiecho pulse sequences of the brain and surrounding structures were obtained without and with intravenous contrast. CONTRAST:  8mL GADAVIST  GADOBUTROL  1 MMOL/ML IV SOLN COMPARISON:  MRI head 08/11/2023. FINDINGS: Brain: When  accounting for differences in technique, no change in a left frontoparietal mass with peripheral enhancement central necrosis. Similar abutment of the overlying dura with possible involvement. Similar mass effect and surrounding edema. Similar additional 5 mm focus of enhancement and in the left posterior hippocampal region. No other lesions identified. No evidence of acute hemorrhage outside of the mass. Similar multifocal chronic microhemorrhages in the supratentorial and infratentorial brain with associated susceptibility artifact. No evidence of acute infarct, midline shift or hydrocephalus. Patchy T2/FLAIR hyperintensities the white matter compatible with chronic microvascular ischemic disease. Vascular: Normal flow voids. Skull and upper cervical spine: Normal marrow signal. Sinuses/Orbits: Similar paranasal sinus mucosal thickening air-fluid levels. No acute orbital findings. Other: No sizable mastoid effusions. IMPRESSION: When accounting for differences in technique, no substantial change in left frontoparietal and left hippocampal enhancing lesions, as detailed above. No new lesions identified. No progressive mass effect. Electronically Signed   By: Gilmore GORMAN Molt M.D.   On: 08/13/2023 01:21   CT CHEST ABDOMEN PELVIS W CONTRAST Result Date: 08/11/2023 CLINICAL DATA:  Brain/CNS neoplasm, staging. Increased weakness. * Tracking Code: BO * EXAM: CT CHEST, ABDOMEN, AND PELVIS WITH CONTRAST TECHNIQUE: Multidetector CT imaging of the chest, abdomen and pelvis was performed following the standard protocol during bolus administration of intravenous contrast. RADIATION DOSE REDUCTION: This exam was performed according to the departmental dose-optimization program which includes automated exposure control, adjustment of the mA and/or kV according to patient size and/or use of iterative reconstruction technique. CONTRAST:  75mL OMNIPAQUE  IOHEXOL  350 MG/ML SOLN COMPARISON:  None Available. FINDINGS: CT CHEST  FINDINGS Cardiovascular: Normal cardiac size.  No pericardial effusion. No aortic aneurysm. There are coronary artery calcifications, in keeping with coronary artery disease. There are also mild peripheral atherosclerotic vascular calcifications of thoracic aorta and its major branches. Mediastinum/Nodes: Visualized thyroid  gland appears grossly unremarkable. No solid / cystic mediastinal masses. The esophagus is nondistended precluding optimal assessment. There are few mildly prominent mediastinal and hilar lymph nodes, which are indeterminate in etiology. No axillary lymphadenopathy by size criteria. Lungs/Pleura: The central tracheo-bronchial tree is patent. Linear irregular area of scarring noted in the right lung apex. There are additional patchy areas of linear, plate-like atelectasis and/or scarring throughout bilateral lungs. No mass or consolidation. No pleural effusion or pneumothorax. There multiple calcified and noncalcified nodules throughout bilateral lungs (marked with electronic arrow sign on series 2006), with largest in the right lung lower lobe measuring 7 x 7 mm. In the absence of prior imaging and in this patient with history of malignancy, these are suspicious. Short-term follow-up examination in 6-12 weeks is recommended to evaluate for the growth pattern. Musculoskeletal: The visualized soft tissues of the chest wall are grossly unremarkable. No suspicious osseous lesions. There are mild multilevel degenerative changes in the visualized spine. CT ABDOMEN PELVIS FINDINGS Hepatobiliary: The liver is normal in size. Non-cirrhotic configuration. No suspicious mass. No intrahepatic or extrahepatic bile duct dilation. No calcified gallstones. Normal gallbladder wall thickness. No pericholecystic inflammatory changes. Pancreas: Unremarkable. No pancreatic ductal dilatation or surrounding inflammatory changes. Spleen: Within normal limits. No focal lesion. Adrenals/Urinary Tract: Adrenal glands are  unremarkable. No suspicious renal mass. There are multiple scattered simple cysts throughout bilateral kidneys with largest arising from the right kidney lower pole measuring up to 2.7 x 3.2 cm. No obstructive uropathy on either side. Evaluation for nonobstructing renal calculi is limited due to excreted contrast in the collecting system. Unremarkable urinary bladder. Stomach/Bowel: There is a small sliding hiatal hernia. No disproportionate dilation of the small or large bowel loops. The appendix was not visualized; however there is no acute inflammatory process in the right lower quadrant. There are several loops of small bowel in the left abdomen and right lower quadrant (marked with electronic arrow sign on series 5) exhibiting mild-to-moderate circumferential wall thickening with perienteric fat stranding and prominence of fossa recta, compatible with enteritis, most likely infective/inflammatory in etiology. Vascular/Lymphatic: No ascites or pneumoperitoneum. No abdominal or pelvic lymphadenopathy, by size criteria. No aneurysmal dilation of the major abdominal arteries. There are mild peripheral atherosclerotic vascular calcifications of the aorta and its major branches. Reproductive: Normal size prostate. Symmetric seminal vesicles. Other: There are fat containing umbilical and bilateral inguinal hernias. The soft tissues and abdominal wall are otherwise unremarkable. Musculoskeletal: No suspicious osseous lesions. There is a relatively well-defined 1.2 x 1.7 cm lytic lesion in the upper portion of S1 vertebrae, incompletely characterized on the current exam but favored benign/degenerative in etiology. There are mild multilevel degenerative changes in the visualized spine. IMPRESSION: 1. There are several loops of small bowel in the left abdomen and right lower quadrant exhibiting mild-to-moderate circumferential wall thickening with perienteric fat stranding and prominence of vasa recta, compatible with  enteritis, most likely infective/inflammatory in etiology. 2. There are multiple calcified and noncalcified nodules throughout bilateral lungs, with largest in the right lung lower lobe measuring 7 x 7 mm. In the absence of prior imaging and in this patient with history of malignancy, these are suspicious. Short-term follow-up examination in 6-12 weeks is recommended to evaluate for the growth pattern. 3. No metastatic disease identified within the abdomen  or pelvis. 4. Multiple other nonacute observations, as described above. Aortic Atherosclerosis (ICD10-I70.0). Electronically Signed   By: Ree Molt M.D.   On: 08/11/2023 16:26   DG Chest Portable 1 View Result Date: 08/11/2023 CLINICAL DATA:  Several day history of increased dizziness EXAM: PORTABLE CHEST 1 VIEW COMPARISON:  Chest radiograph dated 12/08/2019 FINDINGS: Normal lung volumes. No focal consolidations. Costophrenic angles are not entirely included within the field of view. No definite pleural effusion or pneumothorax. The heart size and mediastinal contours are within normal limits. No acute osseous abnormality. IMPRESSION: No acute disease. Electronically Signed   By: Limin  Xu M.D.   On: 08/11/2023 15:31   CT ANGIO HEAD NECK W WO CM Result Date: 08/11/2023 CLINICAL DATA:  Stroke/TIA, determine embolic source EXAM: CT ANGIOGRAPHY HEAD AND NECK WITH AND WITHOUT CONTRAST TECHNIQUE: Multidetector CT imaging of the head and neck was performed using the standard protocol during bolus administration of intravenous contrast. Multiplanar CT image reconstructions and MIPs were obtained to evaluate the vascular anatomy. Carotid stenosis measurements (when applicable) are obtained utilizing NASCET criteria, using the distal internal carotid diameter as the denominator. RADIATION DOSE REDUCTION: This exam was performed according to the departmental dose-optimization program which includes automated exposure control, adjustment of the mA and/or kV  according to patient size and/or use of iterative reconstruction technique. CONTRAST:  75mL OMNIPAQUE  IOHEXOL  350 MG/ML SOLN COMPARISON:  None Available. FINDINGS: CT HEAD FINDINGS Brain: There is a mixed solid and cystic mass present within the left parietal lobe, measuring approximately 4.6 x 3.0 x 4.0 cm. The solid component is relatively dense to cerebral cortex. There is mild surrounding edema, but there is no shift of the midline structures. There is generalized cerebral volume loss and mild to moderate diffuse cerebral white matter disease. Vascular: Moderate calcific atheromatous disease. Skull: No osseous lesions. Sinuses/Orbits: Moderate mucosal disease within the frontal, ethmoid and maxillary sinuses. Status post left lens replacement. Other: None. Review of the MIP images confirms the above findings CTA NECK FINDINGS Aortic arch: Mild calcific atheromatous disease. Right carotid system: The common carotid artery is normal in caliber. There is minimal calcific plaque within the carotid bulb and origin of the internal carotid artery, but no luminal stenosis. Left carotid system: There is calcific and noncalcific plaque present within the proximal internal carotid artery with approximately 50% luminal stenosis. The remainder of the cervical segment is normal in caliber. Vertebral arteries: The vertebral arteries are codominant and normal in caliber. Skeleton: Mild multilevel degenerative disc disease. Other neck: None. Upper chest: There are some streaky opacities present within the right lung apex. There some hazy dependent opacities within the upper lobes bilaterally. There is a calcified granuloma also present posterior medially within the left upper lobe. Review of the MIP images confirms the above findings CTA HEAD FINDINGS Anterior circulation: There is mild calcific atheromatous disease within the carotid siphons but no significant stenosis. The anterior and middle cerebral arteries are normal in  caliber and unremarkable in appearance. Posterior circulation: The vertebrobasilar system is unremarkable. There is fetal origin of the left posterior cerebral artery. The posterior cerebral arteries and cerebellar arteries are normal in caliber. Venous sinuses: Widely patent. Anatomic variants: Fetal origin of the left posterior cerebral artery. Review of the MIP images confirms the above findings IMPRESSION: 1. 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 2. Moderate calcific and noncalcific plaque within the proximal left internal carotid artery, with estimated 50% luminal stenosis. These results  were called by telephone at the time of interpretation on 08/11/2023 at 2:31 pm to provider Dr. CARON SALT , who verbally acknowledged these results. Electronically Signed   By: Evalene Coho M.D.   On: 08/11/2023 14:37   MR BRAIN W WO CONTRAST Result Date: 08/11/2023 CLINICAL DATA:  Provided history: Neuro deficit, acute, stroke suspected. EXAM: MRI HEAD WITHOUT AND WITH CONTRAST TECHNIQUE: Multiplanar, multiecho pulse sequences of the brain and surrounding structures were obtained without and with intravenous contrast. CONTRAST:  8mL GADAVIST  GADOBUTROL  1 MMOL/ML IV SOLN COMPARISON:  None. FINDINGS: Brain: Generalized cerebral atrophy. 4.3 x 3.1 x 4.8 cm mass at the junction of the frontal and parietal lobes on the left (with involvement of the perirolandic region). The mass has peripheral enhancement and appears centrally necrotic. The mass extends to abut, and may involve, the overlying dura (for instance as seen on series 11, image 10). Susceptibility-weighted signal loss associated with this lesion (greatest superiorly), likely reflecting hemosiderin deposition. Moderate surrounding vasogenic edema. Local mass effect with partial effacement of the posterior body and atrium of the left lateral ventricle. No midline shift. Additional 5 mm enhancing lesion at the junction of the  posterior hippocampus and crus of fornix on the left (series 10, image 39) (for instance as seen on series 11, image 13). Subtle edema also present at this site (series 6, image 14). Background mild multifocal T2 FLAIR hyperintense signal abnormality within the cerebral white matter, nonspecific but compatible chronic small vessel ischemic disease. Several nonspecific chronic microhemorrhages scattered within the supratentorial and infratentorial brain. There is no acute infarct. No extra-axial fluid collection. No midline shift. Vascular: Maintained flow voids within the proximal large arterial vessels. Skull and upper cervical spine: No focal worrisome marrow lesion. Sinuses/Orbits: No mass or acute finding within the imaged orbits. Prior left ocular lens replacement. Moderate right ethmoid sinusitis. 20 mm mucous retention cyst, and minimal background mucosal thickening, within the left maxillary sinus. Mild bilateral ethmoid sinusitis. Mild mucosal thickening within the right frontal sinus. Severe left frontal sinusitis (with complete sinus opacification). IMPRESSION: 1. 4.3 x 4.8 cm peripherally enhancing and centrally necrotic mass at the junction of the frontal and parietal lobes on the left, as described. Moderate surrounding vasogenic edema. Local mass effect with partial effacement of the left lateral ventricle. No midline shift. 2. Additional 5 mm enhancing lesion at the junction of the posterior hippocampus and crus of fornix on the left. 3. The above described lesions are most suspicious for intracranial metastases. However, a multicentric high-grade primary CNS neoplasm (such as glioblastoma multiforme) could also have this imaging presentation. 4. Several nonspecific chronic microhemorrhages scattered within the supratentorial and infratentorial brain. 5. Background parenchymal atrophy and chronic small vessel ischemic disease. 6. Paranasal sinus disease as described. Electronically Signed   By: Rockey Childs D.O.   On: 08/11/2023 14:24      Dorn JONELLE Glade 08/13/2023, 8:07 AM

## 2023-08-13 NOTE — Progress Notes (Signed)
 Patient concerned about Therapy needs if Discharged prior to scheduled surgery 8/7, now that preoperative SRS is canceled. States he is concerned about transferring from chair to bed etc even with rollator he has at home. His Daughter lives in [place] and though he considered going home with her, she has 8 steps to front or back door, whereas his home only has two, 4inch steps and she likely cannot stay with him all the days leading up to procedure.

## 2023-08-13 NOTE — Progress Notes (Signed)
 [facility] PROGRESS NOTE   Emir C Taddeo FMW:[id number] DOB: [birth year] DOA: 08/11/2023  PCP: Merna Huxley, NP  Brief History: 78 y.o. M with PMH significant for Hypothyroidism, HTN, polycythemia vera, ETOH and tobacco abuse who initially presented to the ED 7/28 with R sided and generalized weakness with gait disturbance.  This has been going on for 2 years but worsening over the past 2 weeks to the point where he is dependent on a walker. he was found to have a peripherally enhancing necrotic mass at the junction of the L frontal and parietal lobes with local mass effect with 5mm enhancing lesion of the posterior hippocampus.  CTA chest revealed multiple calcified and non-calcified nodules throughout the bilateral lungs -largest 7 mm nodule at the right base close to the diaphragm and right hilar lymphadenopathy.    Consultants: Neurosurgery.  Medical oncology.  Pulmonology.  Procedures: Plan is for endobronchial ultrasound with biopsy today.    Subjective/Interval History: Patient denies any shortness of breath or chest pain.  Right-sided weakness has improved slightly.  No other complaints offered.  Denies any headaches.    Assessment/Plan:  Brain mass concerning for metastatic disease - Deemed most likely to be metastatic.  Possibility of primary and lung based on CT chest. - Pulmonology consulted for possible bronchoscopy and biopsy, plan for 7/30. - Radiation oncology and oncology have been consulted. - Plan for craniotomy for tumor resection 8/7 per neurosurgery. - Per neurosurgery, patient has been started on Decadron  4 mg every 6 hours.  Changed over to every 12 hours. Await EBUS with biopsy today.   Essential hypertension Patient noted to be on verapamil .  Losartan  on hold.  Monitor blood pressures.   GERD: Continue PPI   Alcohol abuse: Patient drinks beer on a daily basis, no history of withdrawal symptoms. CIWA protocol and thiamine . No withdrawal  symptoms noted.  DVT prophylaxis: Lovenox  currently on hold for procedure today.  Code Status:   Full code Family communication: No family at bedside Disposition: CIR is recommended    Medications: Scheduled:  atorvastatin   20 mg Oral Daily   dexamethasone   4 mg Oral Q12H   Or   dexamethasone  (DECADRON ) injection  4 mg Intravenous Q12H   folic acid   1 mg Oral Daily   multivitamin with minerals  1 tablet Oral Daily   pantoprazole   40 mg Oral Q0600   thiamine   100 mg Oral Daily   Or   thiamine   100 mg Intravenous Daily   verapamil   180 mg Oral Daily   Continuous: PRN:docusate sodium , LORazepam  **OR** LORazepam , polyethylene glycol   Objective:  Vital Signs  Vitals:   08/12/23 2127 08/12/23 2347 08/13/23 0413 08/13/23 0725  BP: (!) 113/57 130/60 136/68 (!) 163/82  Pulse: 73 (!) 59 (!) 50 (!) 53  Resp: 18 18 18    Temp: 98.8 F (37.1 C) 98.4 F (36.9 C) 97.8 F (36.6 C) 97.8 F (36.6 C)  TempSrc: Oral Oral Oral Oral  SpO2: 95% 96% 99% 99%    Intake/Output Summary (Last 24 hours) at 08/13/2023 1016 Last data filed at 08/12/2023 2327 Gross per 24 hour  Intake --  Output 1450 ml  Net -1450 ml    General appearance: Awake alert.  In no distress Resp: Clear to auscultation bilaterally.  Normal effort Cardio: S1-S2 is normal regular.  No S3-S4.  No rubs murmurs or bruit GI: Abdomen is soft.  Nontender nondistended.  Bowel sounds are present normal.  No masses organomegaly Extremities: No  edema.  Full range of motion of lower extremities. Neurologic: Alert and oriented x3.  Subtle right-sided weakness noted.   Lab Results:  Data Reviewed: I have personally reviewed following labs and reports of the imaging studies  CBC: Recent Labs  Lab 08/11/23 1155  WBC 4.9  NEUTROABS 3.0  HGB 12.8*  HCT 38.5*  MCV 83.2  PLT 205    Basic Metabolic Panel: Recent Labs  Lab 08/11/23 1155  NA 136  K 3.8  CL 104  CO2 23  GLUCOSE 113*  BUN 7*  CREATININE 0.79   CALCIUM  8.6*    GFR: CrCl cannot be calculated (Unknown ideal weight.).  Liver Function Tests: Recent Labs  Lab 08/11/23 1155  AST 17  ALT 15  ALKPHOS 56  BILITOT 1.5*  PROT 6.5  ALBUMIN 3.5    Coagulation Profile: Recent Labs  Lab 08/11/23 1538  INR 1.1    HbA1C: Recent Labs    08/11/23 1155  HGBA1C 4.7*    Lipid Profile: Recent Labs    08/11/23 1155 08/12/23 0442  CHOL 105 119  HDL 61 71  LDLCALC 36 44  TRIG 39 18  CHOLHDL 1.7 1.7    Radiology Studies: MR BRAIN W WO CONTRAST Result Date: 08/13/2023 CLINICAL DATA:  Brain/CNS neoplasm, staging preoperative SRS treatment planning EXAM: MRI HEAD WITHOUT AND WITH CONTRAST TECHNIQUE: Multiplanar, multiecho pulse sequences of the brain and surrounding structures were obtained without and with intravenous contrast. CONTRAST:  8mL GADAVIST  GADOBUTROL  1 MMOL/ML IV SOLN COMPARISON:  MRI head 08/11/2023. FINDINGS: Brain: When accounting for differences in technique, no change in a left frontoparietal mass with peripheral enhancement central necrosis. Similar abutment of the overlying dura with possible involvement. Similar mass effect and surrounding edema. Similar additional 5 mm focus of enhancement and in the left posterior hippocampal region. No other lesions identified. No evidence of acute hemorrhage outside of the mass. Similar multifocal chronic microhemorrhages in the supratentorial and infratentorial brain with associated susceptibility artifact. No evidence of acute infarct, midline shift or hydrocephalus. Patchy T2/FLAIR hyperintensities the white matter compatible with chronic microvascular ischemic disease. Vascular: Normal flow voids. Skull and upper cervical spine: Normal marrow signal. Sinuses/Orbits: Similar paranasal sinus mucosal thickening air-fluid levels. No acute orbital findings. Other: No sizable mastoid effusions. IMPRESSION: When accounting for differences in technique, no substantial change in left  frontoparietal and left hippocampal enhancing lesions, as detailed above. No new lesions identified. No progressive mass effect. Electronically Signed   By: Gilmore GORMAN Molt M.D.   On: 08/13/2023 01:21   CT CHEST ABDOMEN PELVIS W CONTRAST Result Date: 08/11/2023 CLINICAL DATA:  Brain/CNS neoplasm, staging. Increased weakness. * Tracking Code: BO * EXAM: CT CHEST, ABDOMEN, AND PELVIS WITH CONTRAST TECHNIQUE: Multidetector CT imaging of the chest, abdomen and pelvis was performed following the standard protocol during bolus administration of intravenous contrast. RADIATION DOSE REDUCTION: This exam was performed according to the departmental dose-optimization program which includes automated exposure control, adjustment of the mA and/or kV according to patient size and/or use of iterative reconstruction technique. CONTRAST:  75mL OMNIPAQUE  IOHEXOL  350 MG/ML SOLN COMPARISON:  None Available. FINDINGS: CT CHEST FINDINGS Cardiovascular: Normal cardiac size. No pericardial effusion. No aortic aneurysm. There are coronary artery calcifications, in keeping with coronary artery disease. There are also mild peripheral atherosclerotic vascular calcifications of thoracic aorta and its major branches. Mediastinum/Nodes: Visualized thyroid  gland appears grossly unremarkable. No solid / cystic mediastinal masses. The esophagus is nondistended precluding optimal assessment. There are few mildly prominent mediastinal  and hilar lymph nodes, which are indeterminate in etiology. No axillary lymphadenopathy by size criteria. Lungs/Pleura: The central tracheo-bronchial tree is patent. Linear irregular area of scarring noted in the right lung apex. There are additional patchy areas of linear, plate-like atelectasis and/or scarring throughout bilateral lungs. No mass or consolidation. No pleural effusion or pneumothorax. There multiple calcified and noncalcified nodules throughout bilateral lungs (marked with electronic arrow sign on  series 2006), with largest in the right lung lower lobe measuring 7 x 7 mm. In the absence of prior imaging and in this patient with history of malignancy, these are suspicious. Short-term follow-up examination in 6-12 weeks is recommended to evaluate for the growth pattern. Musculoskeletal: The visualized soft tissues of the chest wall are grossly unremarkable. No suspicious osseous lesions. There are mild multilevel degenerative changes in the visualized spine. CT ABDOMEN PELVIS FINDINGS Hepatobiliary: The liver is normal in size. Non-cirrhotic configuration. No suspicious mass. No intrahepatic or extrahepatic bile duct dilation. No calcified gallstones. Normal gallbladder wall thickness. No pericholecystic inflammatory changes. Pancreas: Unremarkable. No pancreatic ductal dilatation or surrounding inflammatory changes. Spleen: Within normal limits. No focal lesion. Adrenals/Urinary Tract: Adrenal glands are unremarkable. No suspicious renal mass. There are multiple scattered simple cysts throughout bilateral kidneys with largest arising from the right kidney lower pole measuring up to 2.7 x 3.2 cm. No obstructive uropathy on either side. Evaluation for nonobstructing renal calculi is limited due to excreted contrast in the collecting system. Unremarkable urinary bladder. Stomach/Bowel: There is a small sliding hiatal hernia. No disproportionate dilation of the small or large bowel loops. The appendix was not visualized; however there is no acute inflammatory process in the right lower quadrant. There are several loops of small bowel in the left abdomen and right lower quadrant (marked with electronic arrow sign on series 5) exhibiting mild-to-moderate circumferential wall thickening with perienteric fat stranding and prominence of fossa recta, compatible with enteritis, most likely infective/inflammatory in etiology. Vascular/Lymphatic: No ascites or pneumoperitoneum. No abdominal or pelvic lymphadenopathy, by  size criteria. No aneurysmal dilation of the major abdominal arteries. There are mild peripheral atherosclerotic vascular calcifications of the aorta and its major branches. Reproductive: Normal size prostate. Symmetric seminal vesicles. Other: There are fat containing umbilical and bilateral inguinal hernias. The soft tissues and abdominal wall are otherwise unremarkable. Musculoskeletal: No suspicious osseous lesions. There is a relatively well-defined 1.2 x 1.7 cm lytic lesion in the upper portion of S1 vertebrae, incompletely characterized on the current exam but favored benign/degenerative in etiology. There are mild multilevel degenerative changes in the visualized spine. IMPRESSION: 1. There are several loops of small bowel in the left abdomen and right lower quadrant exhibiting mild-to-moderate circumferential wall thickening with perienteric fat stranding and prominence of vasa recta, compatible with enteritis, most likely infective/inflammatory in etiology. 2. There are multiple calcified and noncalcified nodules throughout bilateral lungs, with largest in the right lung lower lobe measuring 7 x 7 mm. In the absence of prior imaging and in this patient with history of malignancy, these are suspicious. Short-term follow-up examination in 6-12 weeks is recommended to evaluate for the growth pattern. 3. No metastatic disease identified within the abdomen or pelvis. 4. Multiple other nonacute observations, as described above. Aortic Atherosclerosis (ICD10-I70.0). Electronically Signed   By: Ree Molt M.D.   On: 08/11/2023 16:26   DG Chest Portable 1 View Result Date: 08/11/2023 CLINICAL DATA:  Several day history of increased dizziness EXAM: PORTABLE CHEST 1 VIEW COMPARISON:  Chest radiograph dated 12/08/2019 FINDINGS:  Normal lung volumes. No focal consolidations. Costophrenic angles are not entirely included within the field of view. No definite pleural effusion or pneumothorax. The heart size and  mediastinal contours are within normal limits. No acute osseous abnormality. IMPRESSION: No acute disease. Electronically Signed   By: Limin  Xu M.D.   On: 08/11/2023 15:31   CT ANGIO HEAD NECK W WO CM Result Date: 08/11/2023 CLINICAL DATA:  Stroke/TIA, determine embolic source EXAM: CT ANGIOGRAPHY HEAD AND NECK WITH AND WITHOUT CONTRAST TECHNIQUE: Multidetector CT imaging of the head and neck was performed using the standard protocol during bolus administration of intravenous contrast. Multiplanar CT image reconstructions and MIPs were obtained to evaluate the vascular anatomy. Carotid stenosis measurements (when applicable) are obtained utilizing NASCET criteria, using the distal internal carotid diameter as the denominator. RADIATION DOSE REDUCTION: This exam was performed according to the departmental dose-optimization program which includes automated exposure control, adjustment of the mA and/or kV according to patient size and/or use of iterative reconstruction technique. CONTRAST:  75mL OMNIPAQUE  IOHEXOL  350 MG/ML SOLN COMPARISON:  None Available. FINDINGS: CT HEAD FINDINGS Brain: There is a mixed solid and cystic mass present within the left parietal lobe, measuring approximately 4.6 x 3.0 x 4.0 cm. The solid component is relatively dense to cerebral cortex. There is mild surrounding edema, but there is no shift of the midline structures. There is generalized cerebral volume loss and mild to moderate diffuse cerebral white matter disease. Vascular: Moderate calcific atheromatous disease. Skull: No osseous lesions. Sinuses/Orbits: Moderate mucosal disease within the frontal, ethmoid and maxillary sinuses. Status post left lens replacement. Other: None. Review of the MIP images confirms the above findings CTA NECK FINDINGS Aortic arch: Mild calcific atheromatous disease. Right carotid system: The common carotid artery is normal in caliber. There is minimal calcific plaque within the carotid bulb and origin  of the internal carotid artery, but no luminal stenosis. Left carotid system: There is calcific and noncalcific plaque present within the proximal internal carotid artery with approximately 50% luminal stenosis. The remainder of the cervical segment is normal in caliber. Vertebral arteries: The vertebral arteries are codominant and normal in caliber. Skeleton: Mild multilevel degenerative disc disease. Other neck: None. Upper chest: There are some streaky opacities present within the right lung apex. There some hazy dependent opacities within the upper lobes bilaterally. There is a calcified granuloma also present posterior medially within the left upper lobe. Review of the MIP images confirms the above findings CTA HEAD FINDINGS Anterior circulation: There is mild calcific atheromatous disease within the carotid siphons but no significant stenosis. The anterior and middle cerebral arteries are normal in caliber and unremarkable in appearance. Posterior circulation: The vertebrobasilar system is unremarkable. There is fetal origin of the left posterior cerebral artery. The posterior cerebral arteries and cerebellar arteries are normal in caliber. Venous sinuses: Widely patent. Anatomic variants: Fetal origin of the left posterior cerebral artery. Review of the MIP images confirms the above findings IMPRESSION: 1. 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 2. Moderate calcific and noncalcific plaque within the proximal left internal carotid artery, with estimated 50% luminal stenosis. These results were called by telephone at the time of interpretation on 08/11/2023 at 2:31 pm to provider Dr. CARON SALT , who verbally acknowledged these results. Electronically Signed   By: Evalene Coho M.D.   On: 08/11/2023 14:37   MR BRAIN W WO CONTRAST Result Date: 08/11/2023 CLINICAL DATA:  Provided history: Neuro deficit, acute, stroke  suspected. EXAM: MRI HEAD WITHOUT AND WITH CONTRAST  TECHNIQUE: Multiplanar, multiecho pulse sequences of the brain and surrounding structures were obtained without and with intravenous contrast. CONTRAST:  8mL GADAVIST  GADOBUTROL  1 MMOL/ML IV SOLN COMPARISON:  None. FINDINGS: Brain: Generalized cerebral atrophy. 4.3 x 3.1 x 4.8 cm mass at the junction of the frontal and parietal lobes on the left (with involvement of the perirolandic region). The mass has peripheral enhancement and appears centrally necrotic. The mass extends to abut, and may involve, the overlying dura (for instance as seen on series 11, image 10). Susceptibility-weighted signal loss associated with this lesion (greatest superiorly), likely reflecting hemosiderin deposition. Moderate surrounding vasogenic edema. Local mass effect with partial effacement of the posterior body and atrium of the left lateral ventricle. No midline shift. Additional 5 mm enhancing lesion at the junction of the posterior hippocampus and crus of fornix on the left (series 10, image 39) (for instance as seen on series 11, image 13). Subtle edema also present at this site (series 6, image 14). Background mild multifocal T2 FLAIR hyperintense signal abnormality within the cerebral white matter, nonspecific but compatible chronic small vessel ischemic disease. Several nonspecific chronic microhemorrhages scattered within the supratentorial and infratentorial brain. There is no acute infarct. No extra-axial fluid collection. No midline shift. Vascular: Maintained flow voids within the proximal large arterial vessels. Skull and upper cervical spine: No focal worrisome marrow lesion. Sinuses/Orbits: No mass or acute finding within the imaged orbits. Prior left ocular lens replacement. Moderate right ethmoid sinusitis. 20 mm mucous retention cyst, and minimal background mucosal thickening, within the left maxillary sinus. Mild bilateral ethmoid sinusitis. Mild mucosal thickening within the right frontal sinus. Severe left frontal  sinusitis (with complete sinus opacification). IMPRESSION: 1. 4.3 x 4.8 cm peripherally enhancing and centrally necrotic mass at the junction of the frontal and parietal lobes on the left, as described. Moderate surrounding vasogenic edema. Local mass effect with partial effacement of the left lateral ventricle. No midline shift. 2. Additional 5 mm enhancing lesion at the junction of the posterior hippocampus and crus of fornix on the left. 3. The above described lesions are most suspicious for intracranial metastases. However, a multicentric high-grade primary CNS neoplasm (such as glioblastoma multiforme) could also have this imaging presentation. 4. Several nonspecific chronic microhemorrhages scattered within the supratentorial and infratentorial brain. 5. Background parenchymal atrophy and chronic small vessel ischemic disease. 6. Paranasal sinus disease as described. Electronically Signed   By: Rockey Childs D.O.   On: 08/11/2023 14:24       LOS: 2 days   Noah King  [facility] Pager on [email or web address]  08/13/2023, 10:16 AM

## 2023-08-13 NOTE — Plan of Care (Signed)
 Problem: Education: Goal: Knowledge of disease or condition will improve 08/13/2023 1613 by Waddell Colton BROCKS, RN Outcome: Progressing 08/13/2023 1613 by Waddell Colton BROCKS, RN Outcome: Progressing Goal: Knowledge of secondary prevention will improve (MUST DOCUMENT ALL) 08/13/2023 1613 by Waddell Colton BROCKS, RN Outcome: Progressing 08/13/2023 1613 by Waddell Colton BROCKS, RN Outcome: Progressing Goal: Knowledge of patient specific risk factors will improve (DELETE if not current risk factor) 08/13/2023 1613 by Waddell Colton BROCKS, RN Outcome: Progressing 08/13/2023 1613 by Waddell Colton BROCKS, RN Outcome: Progressing   Problem: Ischemic Stroke/TIA Tissue Perfusion: Goal: Complications of ischemic stroke/TIA will be minimized 08/13/2023 1613 by Waddell Colton BROCKS, RN Outcome: Progressing 08/13/2023 1613 by Waddell Colton BROCKS, RN Outcome: Progressing   Problem: Coping: Goal: Will verbalize positive feelings about self 08/13/2023 1613 by Waddell Colton BROCKS, RN Outcome: Progressing 08/13/2023 1613 by Waddell Colton BROCKS, RN Outcome: Progressing Goal: Will identify appropriate support needs 08/13/2023 1613 by Waddell Colton BROCKS, RN Outcome: Progressing 08/13/2023 1613 by Waddell Colton BROCKS, RN Outcome: Progressing   Problem: Health Behavior/Discharge Planning: Goal: Ability to manage health-related needs will improve 08/13/2023 1613 by Waddell Colton BROCKS, RN Outcome: Progressing 08/13/2023 1613 by Waddell Colton BROCKS, RN Outcome: Progressing Goal: Goals will be collaboratively established with patient/family 08/13/2023 1613 by Waddell Colton BROCKS, RN Outcome: Progressing 08/13/2023 1613 by Waddell Colton BROCKS, RN Outcome: Progressing   Problem: Self-Care: Goal: Ability to participate in self-care as condition permits will improve 08/13/2023 1613 by Waddell Colton BROCKS, RN Outcome: Progressing 08/13/2023 1613 by Waddell Colton BROCKS, RN Outcome: Progressing Goal: Verbalization of feelings and concerns over difficulty with self-care will improve 08/13/2023 1613 by  Waddell Colton BROCKS, RN Outcome: Progressing 08/13/2023 1613 by Waddell Colton BROCKS, RN Outcome: Progressing Goal: Ability to communicate needs accurately will improve 08/13/2023 1613 by Waddell Colton BROCKS, RN Outcome: Progressing 08/13/2023 1613 by Waddell Colton BROCKS, RN Outcome: Progressing   Problem: Nutrition: Goal: Risk of aspiration will decrease 08/13/2023 1613 by Waddell Colton BROCKS, RN Outcome: Progressing 08/13/2023 1613 by Waddell Colton BROCKS, RN Outcome: Progressing Goal: Dietary intake will improve 08/13/2023 1613 by Waddell Colton BROCKS, RN Outcome: Progressing 08/13/2023 1613 by Waddell Colton BROCKS, RN Outcome: Progressing   Problem: Education: Goal: Knowledge of General Education information will improve Description: Including pain rating scale, medication(s)/side effects and non-pharmacologic comfort measures 08/13/2023 1613 by Waddell Colton BROCKS, RN Outcome: Progressing 08/13/2023 1613 by Waddell Colton BROCKS, RN Outcome: Progressing   Problem: Health Behavior/Discharge Planning: Goal: Ability to manage health-related needs will improve 08/13/2023 1613 by Waddell Colton BROCKS, RN Outcome: Progressing 08/13/2023 1613 by Waddell Colton BROCKS, RN Outcome: Progressing   Problem: Clinical Measurements: Goal: Ability to maintain clinical measurements within normal limits will improve 08/13/2023 1613 by Waddell Colton BROCKS, RN Outcome: Progressing 08/13/2023 1613 by Waddell Colton BROCKS, RN Outcome: Progressing Goal: Will remain free from infection 08/13/2023 1613 by Waddell Colton BROCKS, RN Outcome: Progressing 08/13/2023 1613 by Waddell Colton BROCKS, RN Outcome: Progressing Goal: Diagnostic test results will improve 08/13/2023 1613 by Waddell Colton BROCKS, RN Outcome: Progressing 08/13/2023 1613 by Waddell Colton BROCKS, RN Outcome: Progressing Goal: Respiratory complications will improve 08/13/2023 1613 by Waddell Colton BROCKS, RN Outcome: Progressing 08/13/2023 1613 by Waddell Colton BROCKS, RN Outcome: Progressing Goal: Cardiovascular complication will be  avoided 08/13/2023 1613 by Waddell Colton BROCKS, RN Outcome: Progressing 08/13/2023 1613 by Waddell Colton BROCKS, RN Outcome: Progressing   Problem: Activity: Goal: Risk for activity intolerance will decrease 08/13/2023 1613 by Waddell Colton BROCKS, RN Outcome: Progressing 08/13/2023 1613 by  Waddell Colton BROCKS, RN Outcome: Not Progressing   Problem: Nutrition: Goal: Adequate nutrition will be maintained 08/13/2023 1613 by Waddell Colton BROCKS, RN Outcome: Progressing 08/13/2023 1613 by Waddell Colton BROCKS, RN Outcome: Progressing   Problem: Coping: Goal: Level of anxiety will decrease 08/13/2023 1613 by Waddell Colton BROCKS, RN Outcome: Progressing 08/13/2023 1613 by Waddell Colton BROCKS, RN Outcome: Progressing   Problem: Elimination: Goal: Will not experience complications related to bowel motility 08/13/2023 1613 by Waddell Colton BROCKS, RN Outcome: Progressing 08/13/2023 1613 by Waddell Colton BROCKS, RN Outcome: Progressing Goal: Will not experience complications related to urinary retention 08/13/2023 1613 by Waddell Colton BROCKS, RN Outcome: Progressing 08/13/2023 1613 by Waddell Colton BROCKS, RN Outcome: Progressing   Problem: Pain Managment: Goal: General experience of comfort will improve and/or be controlled 08/13/2023 1613 by Waddell Colton BROCKS, RN Outcome: Progressing 08/13/2023 1613 by Waddell Colton BROCKS, RN Outcome: Progressing   Problem: Safety: Goal: Ability to remain free from injury will improve 08/13/2023 1613 by Waddell Colton BROCKS, RN Outcome: Progressing 08/13/2023 1613 by Waddell Colton BROCKS, RN Outcome: Progressing   Problem: Skin Integrity: Goal: Risk for impaired skin integrity will decrease 08/13/2023 1613 by Waddell Colton BROCKS, RN Outcome: Progressing 08/13/2023 1613 by Waddell Colton BROCKS, RN Outcome: Progressing

## 2023-08-13 NOTE — Op Note (Signed)
  Name:  [name] MRN:  [id number] DOB:  20-Feb-1945  PROCEDURE NOTE  Procedure(s): Flexible bronchoscopy [phone]) Bronchial alveolar lavage [phone]) of the RLL Endobronchial ultrasound ([id number]) Transbronchial needle aspiration ([id number]) of the station 7 LN   Indications:  Hilar / mediastinal lymphadenopathy with brain mass, 7mm RLL nodule.  Consent:  Written informed consent was obtained prior to the procedure. The risks of the procedure including coughing, bleeding and the small chance of lung puncture requiring chest tube were discussed in great detail. The benefits & alternatives including serial follow up were also discussed.  Anesthesia:  General endotracheal.  Procedure summary:  Appropriate equipment was assembled.  The patient was  identified as Noah King. Interim history obtained and brought to the operating room. Safety timeout was performed. The patient was placed supine on the operating table, airway established and general anesthesia administered by Anesthesia team.   After the appropriate level of anesthesia was assured, flexible video bronchoscope was lubricated and inserted through the endotracheal tube.    Airway examination was performed bilaterally to subsegmental level.  Minimal clear secretions were noted, mucosa appeared normal and no endobronchial lesions were identified.  Endobronchial ultrasound video bronchoscope was then lubricated and inserted through the endotracheal tube. Surveillance of the mediastinal and and bilateral hilar lymph node stations was performed.  Mildly enlarged lymph node was noted at station 7, none at 10R  Endobronchial ultrasound guided transbronchial needle aspiration of station 7  (passes x 4),was performed, after which EBUS bronchoscope was withdrawn.  Flexible video bronchoscope was used again .  After ensuring hemostasis , the bronchoscope was withdrawn.  The patient was extubated in operating room and transferred to  PACU.   Specimens sent: Bronchial alveolar lavage specimen of the RLL for  cytology. TBNA of station 7 - prelim negative for malignancy, + lymphocytes  Complications:  No immediate complications were noted.  Hemodynamic parameters and oxygenation remained stable throughout the procedure.  Estimated blood loss:  Less then 5 mL.   Harden Staff MD. DEBE. [facility] Pager [phone] If no response call [phone]   08/13/2023 2:05 PM

## 2023-08-13 NOTE — Plan of Care (Signed)
  Problem: Education: Goal: Knowledge of disease or condition will improve Outcome: Progressing Goal: Knowledge of secondary prevention will improve (MUST DOCUMENT ALL) Outcome: Progressing Goal: Knowledge of patient specific risk factors will improve (DELETE if not current risk factor) Outcome: Progressing   Problem: Ischemic Stroke/TIA Tissue Perfusion: Goal: Complications of ischemic stroke/TIA will be minimized Outcome: Progressing   Problem: Coping: Goal: Will verbalize positive feelings about self Outcome: Progressing Goal: Will identify appropriate support needs Outcome: Progressing   Problem: Health Behavior/Discharge Planning: Goal: Ability to manage health-related needs will improve Outcome: Progressing Goal: Goals will be collaboratively established with patient/family Outcome: Progressing   Problem: Self-Care: Goal: Ability to participate in self-care as condition permits will improve Outcome: Progressing Goal: Verbalization of feelings and concerns over difficulty with self-care will improve Outcome: Progressing Goal: Ability to communicate needs accurately will improve Outcome: Progressing   Problem: Nutrition: Goal: Risk of aspiration will decrease Outcome: Progressing Goal: Dietary intake will improve Outcome: Progressing   Problem: Education: Goal: Knowledge of General Education information will improve Description: Including pain rating scale, medication(s)/side effects and non-pharmacologic comfort measures Outcome: Progressing   Problem: Health Behavior/Discharge Planning: Goal: Ability to manage health-related needs will improve Outcome: Progressing   Problem: Clinical Measurements: Goal: Ability to maintain clinical measurements within normal limits will improve Outcome: Progressing Goal: Will remain free from infection Outcome: Progressing Goal: Diagnostic test results will improve Outcome: Progressing Goal: Respiratory complications will  improve Outcome: Progressing Goal: Cardiovascular complication will be avoided Outcome: Progressing   Problem: Nutrition: Goal: Adequate nutrition will be maintained Outcome: Progressing   Problem: Coping: Goal: Level of anxiety will decrease Outcome: Progressing   Problem: Elimination: Goal: Will not experience complications related to bowel motility Outcome: Progressing Goal: Will not experience complications related to urinary retention Outcome: Progressing   Problem: Pain Managment: Goal: General experience of comfort will improve and/or be controlled Outcome: Progressing   Problem: Safety: Goal: Ability to remain free from injury will improve Outcome: Progressing   Problem: Skin Integrity: Goal: Risk for impaired skin integrity will decrease Outcome: Progressing

## 2023-08-13 NOTE — Anesthesia Procedure Notes (Signed)
 Procedure Name: Intubation Date/Time: 08/13/2023 1:13 PM  Performed by: Claudene Arlin LABOR, CRNAPre-anesthesia Checklist: Patient identified, Emergency Drugs available, Suction available and Patient being monitored Patient Re-evaluated:Patient Re-evaluated prior to induction Oxygen Delivery Method: Circle system utilized Preoxygenation: Pre-oxygenation with 100% oxygen Induction Type: IV induction Ventilation: Mask ventilation without difficulty Laryngoscope Size: Mac and 3 Grade View: Grade II Tube type: Oral Tube size: 8.5 mm Number of attempts: 1 Airway Equipment and Method: Stylet Placement Confirmation: ETT inserted through vocal cords under direct vision, positive ETCO2 and breath sounds checked- equal and bilateral Secured at: 24 cm Tube secured with: Tape Dental Injury: Teeth and Oropharynx as per pre-operative assessment

## 2023-08-13 NOTE — Transfer of Care (Signed)
 Immediate Anesthesia Transfer of Care Note  Patient: Noah King  Procedure(s) Performed: BRONCHOSCOPY, WITH EBUS (Right)  Patient Location: Endoscopy Unit  Anesthesia Type:General  Level of Consciousness: awake  Airway & Oxygen Therapy: Patient Spontanous Breathing  Post-op Assessment: Report given to RN and Post -op Vital signs reviewed and stable  Post vital signs: Reviewed and stable  Last Vitals:  Vitals Value Taken Time  BP 141/70 08/13/23 14:14  Temp 36.4 C 08/13/23 14:14  Pulse 80 08/13/23 14:15  Resp 13 08/13/23 14:15  SpO2 96 % 08/13/23 14:15  Vitals shown include unfiled device data.  Last Pain:  Vitals:   08/13/23 1414  TempSrc: Temporal  PainSc:          Complications: No notable events documented.

## 2023-08-13 NOTE — Progress Notes (Addendum)
 I called and spoke with the patient's daughter and updated her on the discussions to date. We will follow up with the results of bronch and make plans with this information.   Addendum: We were notified by Dr. Jude that the cytology did confirm lymphocytes to indicate a representative sampling of the mediastinal nodes, however priliminary testing did not show any malignant cells. I have shared this with the patient's neurosurgical team. Our plan will be to defer surgical next steps to Dr. Debby and Dr. Darnella, and we will cancel plans for preoperative SRS. We will revisit recommendations for postoperative radiotherapy pending pathology. I attempted to call the patient and his daughter but could not get through by phone.      Donald KYM Husband, PAC

## 2023-08-13 NOTE — Interval H&P Note (Signed)
 History and Physical Interval Note:  08/13/2023 12:52 PM  Noah King Karan  has presented today for surgery, with the diagnosis of mediastinal lymphadenopathy.  The various methods of treatment have been discussed with the patient and family. After consideration of risks, benefits and other options for treatment, the patient has consented to  Procedure(s): BRONCHOSCOPY, WITH EBUS (Right) as a surgical intervention.  The patient's history has been reviewed, patient examined, no change in status, stable for surgery.  I have reviewed the patient's chart and labs.  Questions were answered to the patient's satisfaction.     Harden ROCKFORD Luisdavid Hamblin

## 2023-08-14 ENCOUNTER — Encounter (HOSPITAL_COMMUNITY): Payer: Self-pay | Admitting: Pulmonary Disease

## 2023-08-14 ENCOUNTER — Ambulatory Visit: Admitting: Radiation Oncology

## 2023-08-14 DIAGNOSIS — I10 Essential (primary) hypertension: Secondary | ICD-10-CM | POA: Diagnosis not present

## 2023-08-14 DIAGNOSIS — G9389 Other specified disorders of brain: Secondary | ICD-10-CM | POA: Diagnosis not present

## 2023-08-14 DIAGNOSIS — R531 Weakness: Secondary | ICD-10-CM | POA: Diagnosis not present

## 2023-08-14 LAB — CYTOLOGY - NON PAP

## 2023-08-14 LAB — CBC
HCT: 36.7 % — ABNORMAL LOW (ref 39.0–52.0)
Hemoglobin: 12.2 g/dL — ABNORMAL LOW (ref 13.0–17.0)
MCH: 27.7 pg (ref 26.0–34.0)
MCHC: 33.2 g/dL (ref 30.0–36.0)
MCV: 83.4 fL (ref 80.0–100.0)
Platelets: 218 K/uL (ref 150–400)
RBC: 4.4 MIL/uL (ref 4.22–5.81)
RDW: 15.3 % (ref 11.5–15.5)
WBC: 9.2 K/uL (ref 4.0–10.5)
nRBC: 0 % (ref 0.0–0.2)

## 2023-08-14 LAB — BASIC METABOLIC PANEL WITH GFR
Anion gap: 9 (ref 5–15)
BUN: 18 mg/dL (ref 8–23)
CO2: 25 mmol/L (ref 22–32)
Calcium: 9 mg/dL (ref 8.9–10.3)
Chloride: 105 mmol/L (ref 98–111)
Creatinine, Ser: 1 mg/dL (ref 0.61–1.24)
GFR, Estimated: >60 mL/min (ref >60)
Glucose, Bld: 126 mg/dL — ABNORMAL HIGH (ref 70–99)
Potassium: 4.4 mmol/L (ref 3.5–5.1)
Sodium: 139 mmol/L (ref 135–145)

## 2023-08-14 LAB — MAGNESIUM: Magnesium: 2 mg/dL (ref 1.7–2.4)

## 2023-08-14 MED ORDER — ENOXAPARIN SODIUM 40 MG/0.4ML IJ SOSY
40.0000 mg | PREFILLED_SYRINGE | INTRAMUSCULAR | Status: DC
  Administered 2023-08-14: 40 mg via SUBCUTANEOUS
  Filled 2023-08-14: qty 0.4

## 2023-08-14 NOTE — Progress Notes (Signed)
 Speech Language Pathology Treatment: Cognitive-Linguistic  Patient Details Name: Noah King MRN: [id number] DOB: Sep 08, 1945 Today's Date: 08/14/2023 Time: 1205-1225 SLP Time Calculation (min) (ACUTE ONLY): 20 min  Assessment / Plan / Recommendation Clinical Impression  Intervention focused on memory and problem solving. SLP provided education re: strategies to facilitate memory with examples and he referred to notebook on table and showed therapist where has been using to recall information. Pt told 3 facts about SLP and asked to recall at end of session and needed min prompts initiate writing facts to recall. Pt given a sheet with completed pill box with instructions and asked to state whether it was filled correctly according to medication instructions.  Once he understood the activity his responses were 100% accurate. At end of session he recalled 2 of 3 facts about SLP independently and attempted to use strategy discussed earlier (alphabet) but was unable to recall. He did not recall he had written it in notebook and needed to be reminded. Reviewed importance of using writing strategy and referring to it, keeping it in the same place. Plan is for pt to be discharged prior to brain surgery next week. PT present when SLP arrived and discussed that pt has many friends, neighbors that will check on pt daily, check in from daughter as able and SLP in agreement with plan. Will continue to see as long as in hospital but will likely be discharging soon.    HPI HPI: 78 y.o. male presents to [facility] hospital on 08/11/2023 with R hemiparesis and numbness. 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 PMH includes HTN.      SLP Plan  Continue with current plan of care          Recommendations                     Oral care BID   PRN Cognitive communication deficit (M58.158)     Continue with current plan of care     Noah King  08/14/2023,  12:40 PM

## 2023-08-14 NOTE — Progress Notes (Signed)
 Neurosurgery.  Patient seen an examined. Decreased proper perception in right lower extremity with dysesthesia. 4/5 right lower extremity motor strength, 4/5.  I had a long discussion with the patient and wife and family at the bedside. 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 I discussed that we would perform owner mapping during the surgery and we would expect likely transient worsening of right hemiparesis postop.  He is continuing to hold aspirin. He is on the surgery schedule for resection August 7. he certainly does not have to remain in the hospital until the surgery, and can be discharged if his functional deficits and home support issues do not preclude this.

## 2023-08-14 NOTE — Progress Notes (Addendum)
 Occupational Therapy Treatment Patient Details Name: Noah King MRN: [id number] DOB: [birth year] Today's Date: 08/14/2023   History of present illness 78 y.o. male presents to [facility] hospital on 08/11/2023 with R hemiparesis and numbness. 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 PMH includes HTN.   OT comments  Pt supine in bed and eager to participate in OT, hopeful he can dc home before surgery next week. Pt demonstrating improved ability to mobilize with min assist to contact guard throughout session.  Completed LB dressing with contact guard assist, grooming at sink with contact guard assist. Pt requires cueing for safety and RW mgmt when navigating turns or in small spaces.  Educated on recommendations to have close supervision for mobility and Adls at this time.  Also discussed fall prevention and safety with completing Adls sitting.  IF pt has near 24/7 support, anticipate he will be able to dc home before surgery next week.  Will follow acutely, but per case manager he has good support and will defer OT services until after surgery.       If plan is discharge home, recommend the following:  Assistance with cooking/housework;Assist for transportation;A little help with walking and/or transfers;A little help with bathing/dressing/bathroom;Direct supervision/assist for medications management;Direct supervision/assist for financial management   Equipment Recommendations  Other (comment) (RW)    Recommendations for Other Services      Precautions / Restrictions Precautions Precautions: Fall Restrictions Weight Bearing Restrictions Per Provider Order: No       Mobility Bed Mobility Overal bed mobility: Needs Assistance Bed Mobility: Supine to Sit     Supine to sit: Supervision     General bed mobility comments: HOB flat to simulate home setup    Transfers Overall transfer level: Needs assistance Equipment used: Rolling walker (2  wheels) Transfers: Sit to/from Stand, Bed to chair/wheelchair/BSC Sit to Stand: Min assist, Contact guard assist     Step pivot transfers: Min assist     General transfer comment: inital stand from EOB with min assist to power up and stedy,  donned shoes in recliner with improved power up with contact guard support     Balance Overall balance assessment: Needs assistance Sitting-balance support: No upper extremity supported, Feet supported Sitting balance-Leahy Scale: Good     Standing balance support: Bilateral upper extremity supported, Reliant on assistive device for balance, During functional activity Standing balance-Leahy Scale: Poor Standing balance comment: with RW support dynamically, leaning on sink counter when engaging in ADLs with 0-1 hand support                           ADL either performed or assessed with clinical judgement   ADL Overall ADL's : Needs assistance/impaired     Grooming: Contact guard assist;Wash/dry hands;Standing               Lower Body Dressing: Minimal assistance;Contact guard assist;Sit to/from stand   Toilet Transfer: Minimal assistance;Contact guard assist;Ambulation;Rolling walker (2 wheels)           Functional mobility during ADLs: Minimal assistance;Contact guard assist;Rolling walker (2 wheels);Cueing for safety      Extremity/Trunk Assessment              Vision   Vision Assessment?: No apparent visual deficits   Perception     Praxis     Communication Communication Communication: No apparent difficulties   Cognition Arousal: Alert Behavior During Therapy:  WFL for tasks assessed/performed Cognition: No apparent impairments                               Following commands: Intact        Cueing   Cueing Techniques: Verbal cues  Exercises      Shoulder Instructions       General Comments VSS, discussed safety if DCing home. highly recommend close supervision for mobility  and ADLs    Pertinent Vitals/ Pain       Pain Assessment Pain Assessment: No/denies pain  Home Living                                          Prior Functioning/Environment              Frequency  Min 2X/week        Progress Toward Goals  OT Goals(current goals can now be found in the care plan section)  Progress towards OT goals: Progressing toward goals  Acute Rehab OT Goals Patient Stated Goal: home before surgery OT Goal Formulation: With patient Time For Goal Achievement: 08/26/23 Potential to Achieve Goals: Good  Plan      Co-evaluation                 AM-PAC OT 6 Clicks Daily Activity     Outcome Measure   Help from another person eating meals?: A Little Help from another person taking care of personal grooming?: A Little Help from another person toileting, which includes using toliet, bedpan, or urinal?: A Little Help from another person bathing (including washing, rinsing, drying)?: A Little Help from another person to put on and taking off regular upper body clothing?: A Little Help from another person to put on and taking off regular lower body clothing?: A Little 6 Click Score: 18    End of Session Equipment Utilized During Treatment: Gait belt;Rolling walker (2 wheels)  OT Visit Diagnosis: Unsteadiness on feet (R26.81);Other abnormalities of gait and mobility (R26.89);Muscle weakness (generalized) (M62.81)   Activity Tolerance Patient tolerated treatment well   Patient Left in chair;with call bell/phone within reach;with chair alarm set (PTA present)   Nurse Communication Mobility status        Time: 1104-1140 OT Time Calculation (min): 36 min  Charges: OT General Charges $OT Visit: 1 Visit OT Treatments $Self Care/Home Management : 8-22 mins $Therapeutic Activity: 8-22 mins  Etta NOVAK, OT Acute Rehabilitation Services Office [phone] Secure Chat Preferred    Etta GORMAN Hope 08/14/2023, 2:09  PM

## 2023-08-14 NOTE — Progress Notes (Signed)
    Durable Medical Equipment  (From admission, onward)           Start     Ordered   08/14/23 1236  For home use only DME lightweight manual wheelchair with seat cushion  Once       Comments: Patient suffers from weakness which impairs their ability to perform daily activities like bathing, dressing, and grooming in the home.  A walker will not resolve  issue with performing activities of daily living. A wheelchair will allow patient to safely perform daily activities. Patient is not able to propel themselves in the home using a standard weight wheelchair due to general weakness. Patient can self propel in the lightweight wheelchair. Length of need 6 months . Accessories: elevating leg rests (ELRs), wheel locks, extensions and anti-tippers.   08/14/23 1235

## 2023-08-14 NOTE — Care Management Important Message (Signed)
 Important Message  Patient Details  Name: Noah King MRN: [id number] Date of Birth: 1945-10-30   Important Message Given:  Yes - Medicare IM     Claretta Deed 08/14/2023, 12:31 PM

## 2023-08-14 NOTE — Progress Notes (Signed)
 I spoke with the patient to let him know our discussion yesterday with neurosurgery and plans to proceed next with craniotomy. We will follow up and see him as an outpt after surgery to discuss postoperative radiotherapy recommendations pending his tumor type.      Donald KYM Husband, PAC

## 2023-08-14 NOTE — Progress Notes (Signed)
 [facility] PROGRESS NOTE   Noah King FMW:[id number] DOB: 1945/05/25 DOA: 08/11/2023  PCP: Merna Huxley, NP  Brief History: 78 y.o. M with PMH significant for Hypothyroidism, HTN, polycythemia vera, ETOH and tobacco abuse who initially presented to the ED 7/28 with R sided and generalized weakness with gait disturbance.  This has been going on for 2 years but worsening over the past 2 weeks to the point where he is dependent on a walker. he was found to have a peripherally enhancing necrotic mass at the junction of the L frontal and parietal lobes with local mass effect with 5mm enhancing lesion of the posterior hippocampus.  CTA chest revealed multiple calcified and non-calcified nodules throughout the bilateral lungs -largest 7 mm nodule at the right base close to the diaphragm and right hilar lymphadenopathy.    Consultants: Neurosurgery.  Medical oncology.  Pulmonology.  Procedures: Endobronchial ultrasound with bronchioloalveolar lavage and transbronchial needle aspiration.    Subjective/Interval History: Patient feels well.  No shortness of breath.  No chest pain.  Would prefer to go home if possible prior to his surgery next week.      Assessment/Plan:  Left-sided brain mass concerning for metastatic disease - Deemed most likely to be metastatic.  Possibility of primary in lung based on CT chest. Patient seen by pulmonology and underwent bronchoscopy along with lavage and aspiration biopsy.  Initial results are negative for malignant cells. Patient was seen by radiation oncology who was considering preoperative SRS however in the setting of lack of diagnosis this is not an option anymore.  Postoperative radiation need to be considered. Patient seen by neurosurgery.  Tentatively plan for tumor resection on August 7. Patient remains on dexamethasone . Patient preferred to go home if possible.  When last seen by physical therapy they had recommended inpatient rehab.   Will have them reevaluate patient to see if going home with family is an option.   Essential hypertension Patient noted to be on verapamil .  Losartan  on hold.  Blood pressure is reasonably well-controlled.   GERD: Continue PPI   Alcohol abuse: Patient drinks beer on a daily basis, no history of withdrawal symptoms. CIWA protocol and thiamine . No withdrawal symptoms noted.  DVT prophylaxis: Resume Lovenox  Code Status:   Full code Family communication: No family at bedside Disposition: CIR is recommended.  Apparently only possible after his brain surgery.  Brain surgery is not until August 7.  Patient is considering possibility of going home with the help of his daughter.    Medications: Scheduled:  atorvastatin   20 mg Oral Daily   dexamethasone   4 mg Oral Q12H   Or   dexamethasone  (DECADRON ) injection  4 mg Intravenous Q12H   folic acid   1 mg Oral Daily   multivitamin with minerals  1 tablet Oral Daily   pantoprazole   40 mg Oral Q0600   thiamine   100 mg Oral Daily   Or   thiamine   100 mg Intravenous Daily   verapamil   180 mg Oral Daily   Continuous: PRN:docusate sodium , LORazepam  **OR** LORazepam , polyethylene glycol   Objective:  Vital Signs  Vitals:   08/13/23 2339 08/14/23 0200 08/14/23 0427 08/14/23 0717  BP: 132/68  (!) 148/69 (!) 146/64  Pulse: (!) 53 64 (!) 52 (!) 52  Resp: 18  16 15   Temp: 98.3 F (36.8 C)  97.7 F (36.5 C) 98.8 F (37.1 C)  TempSrc: Oral  Oral   SpO2: 97%  91% 100%  Weight:  Height:        Intake/Output Summary (Last 24 hours) at 08/14/2023 1029 Last data filed at 08/13/2023 1741 Gross per 24 hour  Intake 400 ml  Output 675 ml  Net -275 ml    General appearance: Awake alert.  In no distress Resp: Clear to auscultation bilaterally.  Normal effort Cardio: S1-S2 is normal regular.  No S3-S4.  No rubs murmurs or bruit GI: Abdomen is soft.  Nontender nondistended.  Bowel sounds are present normal.  No masses  organomegaly Extremities: No edema.  Full range of motion of lower extremities. Neurologic: Alert and oriented x3.  Subtle right-sided weakness   Lab Results:  Data Reviewed: I have personally reviewed following labs and reports of the imaging studies  CBC: Recent Labs  Lab 08/11/23 1155 08/14/23 0424  WBC 4.9 9.2  NEUTROABS 3.0  --   HGB 12.8* 12.2*  HCT 38.5* 36.7*  MCV 83.2 83.4  PLT 205 218    Basic Metabolic Panel: Recent Labs  Lab 08/11/23 1155 08/14/23 0424  NA 136 139  K 3.8 4.4  CL 104 105  CO2 23 25  GLUCOSE 113* 126*  BUN 7* 18  CREATININE 0.79 1.00  CALCIUM  8.6* 9.0  MG  --  2.0    GFR: Estimated Creatinine Clearance: 62.9 mL/min (by C-G formula based on SCr of 1 mg/dL).  Liver Function Tests: Recent Labs  Lab 08/11/23 1155  AST 17  ALT 15  ALKPHOS 56  BILITOT 1.5*  PROT 6.5  ALBUMIN 3.5    Coagulation Profile: Recent Labs  Lab 08/11/23 1538  INR 1.1    HbA1C: Recent Labs    08/11/23 1155  HGBA1C 4.7*    Lipid Profile: Recent Labs    08/11/23 1155 08/12/23 0442  CHOL 105 119  HDL 61 71  LDLCALC 36 44  TRIG 39 18  CHOLHDL 1.7 1.7    Radiology Studies: MR BRAIN W WO CONTRAST Result Date: 08/13/2023 CLINICAL DATA:  Brain/CNS neoplasm, staging preoperative SRS treatment planning EXAM: MRI HEAD WITHOUT AND WITH CONTRAST TECHNIQUE: Multiplanar, multiecho pulse sequences of the brain and surrounding structures were obtained without and with intravenous contrast. CONTRAST:  8mL GADAVIST  GADOBUTROL  1 MMOL/ML IV SOLN COMPARISON:  MRI head 08/11/2023. FINDINGS: Brain: When accounting for differences in technique, no change in a left frontoparietal mass with peripheral enhancement central necrosis. Similar abutment of the overlying dura with possible involvement. Similar mass effect and surrounding edema. Similar additional 5 mm focus of enhancement and in the left posterior hippocampal region. No other lesions identified. No evidence  of acute hemorrhage outside of the mass. Similar multifocal chronic microhemorrhages in the supratentorial and infratentorial brain with associated susceptibility artifact. No evidence of acute infarct, midline shift or hydrocephalus. Patchy T2/FLAIR hyperintensities the white matter compatible with chronic microvascular ischemic disease. Vascular: Normal flow voids. Skull and upper cervical spine: Normal marrow signal. Sinuses/Orbits: Similar paranasal sinus mucosal thickening air-fluid levels. No acute orbital findings. Other: No sizable mastoid effusions. IMPRESSION: When accounting for differences in technique, no substantial change in left frontoparietal and left hippocampal enhancing lesions, as detailed above. No new lesions identified. No progressive mass effect. Electronically Signed   By: Gilmore GORMAN Molt M.D.   On: 08/13/2023 01:21       LOS: 3 days   Lorris Carducci Verdene  [facility] Pager on [email or web address]  08/14/2023, 10:29 AM

## 2023-08-14 NOTE — TOC Progression Note (Addendum)
 Transition of Care [facility]) - Progression Note    Patient Details  Name: Noah King MRN: [id number] Date of Birth: 21-Nov-1945  Transition of [facility]) CM/SW Contact  Andrez JULIANNA George, RN Phone Number: 08/14/2023, 1:07 PM  Clinical Narrative:     Plan is for discharge home tomorrow with [facility] RN/ aide. He will return 08/21/2023 for surgery.  Wheelchair ordered through [employer] and will be delivered to the room.  Pts daughter is going to stay with him until he is able to be home alone. She is an OT. She has been made aware the recommendations are for 24 hour supervision. Pt has transportation home.  Expected Discharge Plan: Home w Home Health Services Barriers to Discharge: Continued Medical Work up               Expected Discharge Plan and Services   Discharge Planning Services: CM Consult Post Acute Care Choice: Home Health, Durable Medical Equipment Living arrangements for the past 2 months: Single Family Home                 DME Arranged: Wheelchair manual DME Agency: [employer] Date DME Agency Contacted: 08/14/23   Representative spoke with at DME Agency: Darlyn HH Arranged: RN, Nurse's Aide HH Agency: [facility] Date [facility] Agency Contacted: 08/14/23   Representative spoke with at [facility] Agency: [place]   Social Drivers of Health (SDOH) Interventions SDOH Screenings   Food Insecurity: No Food Insecurity (08/12/2023)  Housing: Low Risk  (08/12/2023)  Transportation Needs: No Transportation Needs (08/12/2023)  Utilities: Not At Risk (08/12/2023)  Alcohol Screen: Low Risk  (11/01/2021)  Depression (PHQ2-9): Low Risk  (11/21/2022)  Financial Resource Strain: Low Risk  (11/01/2021)  Physical Activity: Insufficiently Active (11/01/2021)  Social Connections: Moderately Isolated (08/12/2023)  Stress: No Stress Concern Present (11/01/2021)  Tobacco Use: Medium Risk (08/13/2023)    Readmission Risk Interventions     No data to display

## 2023-08-14 NOTE — Anesthesia Postprocedure Evaluation (Signed)
 Anesthesia Post Note  Patient: Noah King  Procedure(s) Performed: BRONCHOSCOPY, WITH EBUS (Right)     Patient location during evaluation: Endoscopy Anesthesia Type: General Level of consciousness: awake and alert Pain management: pain level controlled Vital Signs Assessment: post-procedure vital signs reviewed and stable Respiratory status: spontaneous breathing, nonlabored ventilation, respiratory function stable and patient connected to nasal cannula oxygen Cardiovascular status: blood pressure returned to baseline and stable Postop Assessment: no apparent nausea or vomiting Anesthetic complications: no   No notable events documented.  Last Vitals:  Vitals:   08/14/23 0427 08/14/23 0717  BP: (!) 148/69 (!) 146/64  Pulse: (!) 52 (!) 52  Resp: 16 15  Temp: 36.5 C 37.1 C  SpO2: 91% 100%    Last Pain:  Vitals:   08/14/23 0427  TempSrc: Oral  PainSc:                  Rome Ade

## 2023-08-14 NOTE — Progress Notes (Addendum)
 We performed transbronchial aspiration of subcarinal lymph node.  ROSE >> showed lymphocytes, no malignant cells FInal report >>benign LN RT hilar LN was not visualised  Plan of care according to neurosurgery  & radiation oncology  Kinslei Labine V. Jude MD

## 2023-08-14 NOTE — Progress Notes (Addendum)
 Physical Therapy Treatment Patient Details Name: Noah King MRN: [id number] DOB: Oct 25, 1945 Today's Date: 08/14/2023   History of Present Illness 78 y.o. male presents to [facility] hospital on 08/11/2023 with R hemiparesis and numbness. 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 PMH includes HTN.    PT Comments  Pt received sitting in the recliner and agreeable to session. Pt demonstrates improved stability this session and is able to significantly increase gait distance with up to min A for balance. Pt continues to demonstrate RLE deficits, some that were present PTA, causing increased instability. Pt able to demonstrate seated exercises and x5 serial STS with min A for balance with no AD. Education provided on safety at home with pt reporting great support and plan for someone to be present when mobilizing. Discussed and demonstrated car transfer with pt verbalizing understanding. Pt continues to benefit from PT services to progress toward functional mobility goals.    If plan is discharge home, recommend the following: A little help with walking and/or transfers;A little help with bathing/dressing/bathroom;Assistance with cooking/housework;Assist for transportation;Help with stairs or ramp for entrance   Can travel by private vehicle        Equipment Recommendations  None recommended by PT    Recommendations for Other Services       Precautions / Restrictions Precautions Precautions: Fall Restrictions Weight Bearing Restrictions Per Provider Order: No     Mobility  Bed Mobility               General bed mobility comments: Pt in recliner at beginning and end of session    Transfers Overall transfer level: Needs assistance Equipment used: Rolling walker (2 wheels), None Transfers: Sit to/from Stand Sit to Stand: Min assist, Contact guard assist           General transfer comment: From recliner x6 with min A progressing to CGA  with cues for anterior weight shift. heavy UE reliance for power up. Min A for stability when standing without AD    Ambulation/Gait Ambulation/Gait assistance: Min assist, Contact guard assist Gait Distance (Feet): 150 Feet Assistive device: Rolling walker (2 wheels) Gait Pattern/deviations: Trunk flexed, Step-through pattern, Decreased stride length, Decreased step length - right, Decreased dorsiflexion - right, Steppage       General Gait Details: Pt demonstrates decreased R dorsiflexion, heel strike, and step length. Pt demonstrates increased instability during turns due to R foot kicking back leg of RW requiring intermittent min A for balance and cues for RW proximity   Stairs             Wheelchair Mobility     Tilt Bed    Modified Rankin (Stroke Patients Only)       Balance Overall balance assessment: Needs assistance Sitting-balance support: No upper extremity supported, Feet supported Sitting balance-Leahy Scale: Good     Standing balance support: Bilateral upper extremity supported, Reliant on assistive device for balance, During functional activity Standing balance-Leahy Scale: Poor Standing balance comment: with RW support                            Communication Communication Communication: No apparent difficulties  Cognition Arousal: Alert Behavior During Therapy: WFL for tasks assessed/performed   PT - Cognitive impairments: No apparent impairments                         Following  commands: Intact      Cueing Cueing Techniques: Verbal cues  Exercises General Exercises - Lower Extremity Long Arc Quad: AROM, Seated, Both, 10 reps Hip Flexion/Marching: AROM, Both, 10 reps, Seated Other Exercises Other Exercises: R ankle dorsiflexion x5 Other Exercises: x5 serial STS without AD    General Comments        Pertinent Vitals/Pain Pain Assessment Pain Assessment: No/denies pain     PT Goals (current goals can now be  found in the care plan section) Acute Rehab PT Goals Patient Stated Goal: to return to independence PT Goal Formulation: With patient Time For Goal Achievement: 08/25/23 Progress towards PT goals: Progressing toward goals    Frequency    Min 2X/week       AM-PAC PT 6 Clicks Mobility   Outcome Measure  Help needed turning from your back to your side while in a flat bed without using bedrails?: A Little Help needed moving from lying on your back to sitting on the side of a flat bed without using bedrails?: A Little Help needed moving to and from a bed to a chair (including a wheelchair)?: A Little Help needed standing up from a chair using your arms (e.g., wheelchair or bedside chair)?: A Little Help needed to walk in hospital room?: A Little Help needed climbing 3-5 steps with a railing? : A Little 6 Click Score: 18    End of Session Equipment Utilized During Treatment: Gait belt Activity Tolerance: Patient tolerated treatment well Patient left: in chair;with chair alarm set;with call bell/phone within reach Nurse Communication: Mobility status PT Visit Diagnosis: Other abnormalities of gait and mobility (R26.89);Muscle weakness (generalized) (M62.81);Other symptoms and signs involving the nervous system (R29.898)     Time: 8859-8782 PT Time Calculation (min) (ACUTE ONLY): 37 min  Charges:    $Gait Training: 8-22 mins $Therapeutic Exercise: 8-22 mins PT General Charges $$ ACUTE PT VISIT: 1 Visit                     Darryle George, PTA Acute Rehabilitation Services Secure Chat Preferred  [phone]    Darryle George 08/14/2023, 1:38 PM

## 2023-08-15 ENCOUNTER — Encounter: Payer: Self-pay | Admitting: Radiation Oncology

## 2023-08-15 ENCOUNTER — Other Ambulatory Visit (HOSPITAL_COMMUNITY): Payer: Self-pay

## 2023-08-15 DIAGNOSIS — R531 Weakness: Secondary | ICD-10-CM | POA: Diagnosis not present

## 2023-08-15 DIAGNOSIS — G9389 Other specified disorders of brain: Secondary | ICD-10-CM | POA: Diagnosis not present

## 2023-08-15 MED ORDER — THIAMINE HCL 100 MG PO TABS
100.0000 mg | ORAL_TABLET | Freq: Every day | ORAL | 0 refills | Status: DC
  Filled 2023-08-15: qty 30, 30d supply, fill #0

## 2023-08-15 MED ORDER — DEXAMETHASONE 4 MG PO TABS
4.0000 mg | ORAL_TABLET | Freq: Two times a day (BID) | ORAL | 0 refills | Status: DC
  Filled 2023-08-15: qty 60, 30d supply, fill #0

## 2023-08-15 MED ORDER — FOLIC ACID 1 MG PO TABS
1.0000 mg | ORAL_TABLET | Freq: Every day | ORAL | 0 refills | Status: DC
  Filled 2023-08-15: qty 30, 30d supply, fill #0

## 2023-08-15 MED ORDER — POLYETHYLENE GLYCOL 3350 17 GM/SCOOP PO POWD
17.0000 g | Freq: Every day | ORAL | 0 refills | Status: DC | PRN
  Filled 2023-08-15: qty 238, 14d supply, fill #0

## 2023-08-15 NOTE — Discharge Summary (Signed)
 [facility]  Physician Discharge Summary   Patient ID: Noah King MRN: [id number] DOB/AGE: 1945/09/18 78 y.o.  Admit date: 08/11/2023 Discharge date:   08/15/2023   PCP: Merna Huxley, NP  DISCHARGE DIAGNOSES:  Principal Problem:   Right sided weakness Active Problems:   Mediastinal lymphadenopathy   RECOMMENDATIONS FOR OUTPATIENT FOLLOW UP: Patient to undergo resection of his brain mass on August 7.  Further instructions to be provided by neurosurgery.   Home Health: PT OT RN Equipment/Devices: Rolling walker, wheelchair  CODE STATUS: Full code  DISCHARGE CONDITION: fair  Diet recommendation: Regular  INITIAL HISTORY: 78 y.o. M with PMH significant for Hypothyroidism, HTN, polycythemia vera, ETOH and tobacco abuse who initially presented to the ED 7/28 with R sided and generalized weakness with gait disturbance.  This has been going on for 2 years but worsening over the past 2 weeks to the point where he is dependent on a walker. he was found to have a peripherally enhancing necrotic mass at the junction of the L frontal and parietal lobes with local mass effect with 5mm enhancing lesion of the posterior hippocampus.  CTA chest revealed multiple calcified and non-calcified nodules throughout the bilateral lungs -largest 7 mm nodule at the right base close to the diaphragm and right hilar lymphadenopathy.    Consultants: Neurosurgery.  Medical oncology.  Pulmonology.   Procedures: Endobronchial ultrasound with bronchioloalveolar lavage and transbronchial needle aspiration.  HOSPITAL COURSE:   Left-sided brain mass concerning for metastatic disease Deemed most likely to be metastatic.  Possibility of primary in lung based on CT chest. Patient seen by pulmonology and underwent bronchoscopy along with lavage and aspiration biopsy.  Initial results are negative for malignant cells. Patient was seen by radiation oncology who was considering preoperative SRS  however in the setting of lack of diagnosis this is not an option anymore.  Postoperative radiation need to be considered.  Patient seen by neurosurgery.  Tentatively plan for tumor resection on August 7. Patient remains on dexamethasone . Plan is for the patient to go home with his daughter and then return for surgery. Home health has been ordered.   Essential hypertension Patient was on verapamil  and losartan  at home.  Started back on verapamil  however noted to have bradycardia.  This has been discontinued.  He should resume the losartan .     GERD: Continue PPI   Alcohol abuse: Patient drinks beer on a daily basis, no history of withdrawal symptoms. CIWA protocol and thiamine . No withdrawal symptoms noted.  Patient is stable.  Okay for discharge home today.  He will return to this hospital next week for his surgery.   PERTINENT LABS:  The results of significant diagnostics from this hospitalization (including imaging, microbiology, ancillary and laboratory) are listed below for reference.      Labs:   Basic Metabolic Panel: Recent Labs  Lab 08/11/23 1155 08/14/23 0424  NA 136 139  K 3.8 4.4  CL 104 105  CO2 23 25  GLUCOSE 113* 126*  BUN 7* 18  CREATININE 0.79 1.00  CALCIUM  8.6* 9.0  MG  --  2.0   Liver Function Tests: Recent Labs  Lab 08/11/23 1155  AST 17  ALT 15  ALKPHOS 56  BILITOT 1.5*  PROT 6.5  ALBUMIN 3.5    CBC: Recent Labs  Lab 08/11/23 1155 08/14/23 0424  WBC 4.9 9.2  NEUTROABS 3.0  --   HGB 12.8* 12.2*  HCT 38.5* 36.7*  MCV 83.2 83.4  PLT 205 218  IMAGING STUDIES MR BRAIN W WO CONTRAST Result Date: 08/13/2023 CLINICAL DATA:  Brain/CNS neoplasm, staging preoperative SRS treatment planning EXAM: MRI HEAD WITHOUT AND WITH CONTRAST TECHNIQUE: Multiplanar, multiecho pulse sequences of the brain and surrounding structures were obtained without and with intravenous contrast. CONTRAST:  8mL GADAVIST  GADOBUTROL  1 MMOL/ML IV SOLN  COMPARISON:  MRI head 08/11/2023. FINDINGS: Brain: When accounting for differences in technique, no change in a left frontoparietal mass with peripheral enhancement central necrosis. Similar abutment of the overlying dura with possible involvement. Similar mass effect and surrounding edema. Similar additional 5 mm focus of enhancement and in the left posterior hippocampal region. No other lesions identified. No evidence of acute hemorrhage outside of the mass. Similar multifocal chronic microhemorrhages in the supratentorial and infratentorial brain with associated susceptibility artifact. No evidence of acute infarct, midline shift or hydrocephalus. Patchy T2/FLAIR hyperintensities the white matter compatible with chronic microvascular ischemic disease. Vascular: Normal flow voids. Skull and upper cervical spine: Normal marrow signal. Sinuses/Orbits: Similar paranasal sinus mucosal thickening air-fluid levels. No acute orbital findings. Other: No sizable mastoid effusions. IMPRESSION: When accounting for differences in technique, no substantial change in left frontoparietal and left hippocampal enhancing lesions, as detailed above. No new lesions identified. No progressive mass effect. Electronically Signed   By: Gilmore GORMAN Molt M.D.   On: 08/13/2023 01:21   CT CHEST ABDOMEN PELVIS W CONTRAST Result Date: 08/11/2023 CLINICAL DATA:  Brain/CNS neoplasm, staging. Increased weakness. * Tracking Code: BO * EXAM: CT CHEST, ABDOMEN, AND PELVIS WITH CONTRAST TECHNIQUE: Multidetector CT imaging of the chest, abdomen and pelvis was performed following the standard protocol during bolus administration of intravenous contrast. RADIATION DOSE REDUCTION: This exam was performed according to the departmental dose-optimization program which includes automated exposure control, adjustment of the mA and/or kV according to patient size and/or use of iterative reconstruction technique. CONTRAST:  75mL OMNIPAQUE  IOHEXOL  350 MG/ML  SOLN COMPARISON:  None Available. FINDINGS: CT CHEST FINDINGS Cardiovascular: Normal cardiac size. No pericardial effusion. No aortic aneurysm. There are coronary artery calcifications, in keeping with coronary artery disease. There are also mild peripheral atherosclerotic vascular calcifications of thoracic aorta and its major branches. Mediastinum/Nodes: Visualized thyroid  gland appears grossly unremarkable. No solid / cystic mediastinal masses. The esophagus is nondistended precluding optimal assessment. There are few mildly prominent mediastinal and hilar lymph nodes, which are indeterminate in etiology. No axillary lymphadenopathy by size criteria. Lungs/Pleura: The central tracheo-bronchial tree is patent. Linear irregular area of scarring noted in the right lung apex. There are additional patchy areas of linear, plate-like atelectasis and/or scarring throughout bilateral lungs. No mass or consolidation. No pleural effusion or pneumothorax. There multiple calcified and noncalcified nodules throughout bilateral lungs (marked with electronic arrow sign on series 2006), with largest in the right lung lower lobe measuring 7 x 7 mm. In the absence of prior imaging and in this patient with history of malignancy, these are suspicious. Short-term follow-up examination in 6-12 weeks is recommended to evaluate for the growth pattern. Musculoskeletal: The visualized soft tissues of the chest wall are grossly unremarkable. No suspicious osseous lesions. There are mild multilevel degenerative changes in the visualized spine. CT ABDOMEN PELVIS FINDINGS Hepatobiliary: The liver is normal in size. Non-cirrhotic configuration. No suspicious mass. No intrahepatic or extrahepatic bile duct dilation. No calcified gallstones. Normal gallbladder wall thickness. No pericholecystic inflammatory changes. Pancreas: Unremarkable. No pancreatic ductal dilatation or surrounding inflammatory changes. Spleen: Within normal limits. No focal  lesion. Adrenals/Urinary Tract: Adrenal glands are unremarkable. No suspicious renal  mass. There are multiple scattered simple cysts throughout bilateral kidneys with largest arising from the right kidney lower pole measuring up to 2.7 x 3.2 cm. No obstructive uropathy on either side. Evaluation for nonobstructing renal calculi is limited due to excreted contrast in the collecting system. Unremarkable urinary bladder. Stomach/Bowel: There is a small sliding hiatal hernia. No disproportionate dilation of the small or large bowel loops. The appendix was not visualized; however there is no acute inflammatory process in the right lower quadrant. There are several loops of small bowel in the left abdomen and right lower quadrant (marked with electronic arrow sign on series 5) exhibiting mild-to-moderate circumferential wall thickening with perienteric fat stranding and prominence of fossa recta, compatible with enteritis, most likely infective/inflammatory in etiology. Vascular/Lymphatic: No ascites or pneumoperitoneum. No abdominal or pelvic lymphadenopathy, by size criteria. No aneurysmal dilation of the major abdominal arteries. There are mild peripheral atherosclerotic vascular calcifications of the aorta and its major branches. Reproductive: Normal size prostate. Symmetric seminal vesicles. Other: There are fat containing umbilical and bilateral inguinal hernias. The soft tissues and abdominal wall are otherwise unremarkable. Musculoskeletal: No suspicious osseous lesions. There is a relatively well-defined 1.2 x 1.7 cm lytic lesion in the upper portion of S1 vertebrae, incompletely characterized on the current exam but favored benign/degenerative in etiology. There are mild multilevel degenerative changes in the visualized spine. IMPRESSION: 1. There are several loops of small bowel in the left abdomen and right lower quadrant exhibiting mild-to-moderate circumferential wall thickening with perienteric fat  stranding and prominence of vasa recta, compatible with enteritis, most likely infective/inflammatory in etiology. 2. There are multiple calcified and noncalcified nodules throughout bilateral lungs, with largest in the right lung lower lobe measuring 7 x 7 mm. In the absence of prior imaging and in this patient with history of malignancy, these are suspicious. Short-term follow-up examination in 6-12 weeks is recommended to evaluate for the growth pattern. 3. No metastatic disease identified within the abdomen or pelvis. 4. Multiple other nonacute observations, as described above. Aortic Atherosclerosis (ICD10-I70.0). Electronically Signed   By: Ree Molt M.D.   On: 08/11/2023 16:26   DG Chest Portable 1 View Result Date: 08/11/2023 CLINICAL DATA:  Several day history of increased dizziness EXAM: PORTABLE CHEST 1 VIEW COMPARISON:  Chest radiograph dated 12/08/2019 FINDINGS: Normal lung volumes. No focal consolidations. Costophrenic angles are not entirely included within the field of view. No definite pleural effusion or pneumothorax. The heart size and mediastinal contours are within normal limits. No acute osseous abnormality. IMPRESSION: No acute disease. Electronically Signed   By: Limin  Xu M.D.   On: 08/11/2023 15:31   CT ANGIO HEAD NECK W WO CM Result Date: 08/11/2023 CLINICAL DATA:  Stroke/TIA, determine embolic source EXAM: CT ANGIOGRAPHY HEAD AND NECK WITH AND WITHOUT CONTRAST TECHNIQUE: Multidetector CT imaging of the head and neck was performed using the standard protocol during bolus administration of intravenous contrast. Multiplanar CT image reconstructions and MIPs were obtained to evaluate the vascular anatomy. Carotid stenosis measurements (when applicable) are obtained utilizing NASCET criteria, using the distal internal carotid diameter as the denominator. RADIATION DOSE REDUCTION: This exam was performed according to the departmental dose-optimization program which includes automated  exposure control, adjustment of the mA and/or kV according to patient size and/or use of iterative reconstruction technique. CONTRAST:  75mL OMNIPAQUE  IOHEXOL  350 MG/ML SOLN COMPARISON:  None Available. FINDINGS: CT HEAD FINDINGS Brain: There is a mixed solid and cystic mass present within the left parietal lobe, measuring  approximately 4.6 x 3.0 x 4.0 cm. The solid component is relatively dense to cerebral cortex. There is mild surrounding edema, but there is no shift of the midline structures. There is generalized cerebral volume loss and mild to moderate diffuse cerebral white matter disease. Vascular: Moderate calcific atheromatous disease. Skull: No osseous lesions. Sinuses/Orbits: Moderate mucosal disease within the frontal, ethmoid and maxillary sinuses. Status post left lens replacement. Other: None. Review of the MIP images confirms the above findings CTA NECK FINDINGS Aortic arch: Mild calcific atheromatous disease. Right carotid system: The common carotid artery is normal in caliber. There is minimal calcific plaque within the carotid bulb and origin of the internal carotid artery, but no luminal stenosis. Left carotid system: There is calcific and noncalcific plaque present within the proximal internal carotid artery with approximately 50% luminal stenosis. The remainder of the cervical segment is normal in caliber. Vertebral arteries: The vertebral arteries are codominant and normal in caliber. Skeleton: Mild multilevel degenerative disc disease. Other neck: None. Upper chest: There are some streaky opacities present within the right lung apex. There some hazy dependent opacities within the upper lobes bilaterally. There is a calcified granuloma also present posterior medially within the left upper lobe. Review of the MIP images confirms the above findings CTA HEAD FINDINGS Anterior circulation: There is mild calcific atheromatous disease within the carotid siphons but no significant stenosis. The  anterior and middle cerebral arteries are normal in caliber and unremarkable in appearance. Posterior circulation: The vertebrobasilar system is unremarkable. There is fetal origin of the left posterior cerebral artery. The posterior cerebral arteries and cerebellar arteries are normal in caliber. Venous sinuses: Widely patent. Anatomic variants: Fetal origin of the left posterior cerebral artery. Review of the MIP images confirms the above findings IMPRESSION: 1. 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 2. Moderate calcific and noncalcific plaque within the proximal left internal carotid artery, with estimated 50% luminal stenosis. These results were called by telephone at the time of interpretation on 08/11/2023 at 2:31 pm to provider Dr. CARON SALT , who verbally acknowledged these results. Electronically Signed   By: Evalene Coho M.D.   On: 08/11/2023 14:37   MR BRAIN W WO CONTRAST Result Date: 08/11/2023 CLINICAL DATA:  Provided history: Neuro deficit, acute, stroke suspected. EXAM: MRI HEAD WITHOUT AND WITH CONTRAST TECHNIQUE: Multiplanar, multiecho pulse sequences of the brain and surrounding structures were obtained without and with intravenous contrast. CONTRAST:  8mL GADAVIST  GADOBUTROL  1 MMOL/ML IV SOLN COMPARISON:  None. FINDINGS: Brain: Generalized cerebral atrophy. 4.3 x 3.1 x 4.8 cm mass at the junction of the frontal and parietal lobes on the left (with involvement of the perirolandic region). The mass has peripheral enhancement and appears centrally necrotic. The mass extends to abut, and may involve, the overlying dura (for instance as seen on series 11, image 10). Susceptibility-weighted signal loss associated with this lesion (greatest superiorly), likely reflecting hemosiderin deposition. Moderate surrounding vasogenic edema. Local mass effect with partial effacement of the posterior body and atrium of the left lateral ventricle. No midline shift.  Additional 5 mm enhancing lesion at the junction of the posterior hippocampus and crus of fornix on the left (series 10, image 39) (for instance as seen on series 11, image 13). Subtle edema also present at this site (series 6, image 14). Background mild multifocal T2 FLAIR hyperintense signal abnormality within the cerebral white matter, nonspecific but compatible chronic small vessel ischemic disease. Several nonspecific chronic microhemorrhages scattered  within the supratentorial and infratentorial brain. There is no acute infarct. No extra-axial fluid collection. No midline shift. Vascular: Maintained flow voids within the proximal large arterial vessels. Skull and upper cervical spine: No focal worrisome marrow lesion. Sinuses/Orbits: No mass or acute finding within the imaged orbits. Prior left ocular lens replacement. Moderate right ethmoid sinusitis. 20 mm mucous retention cyst, and minimal background mucosal thickening, within the left maxillary sinus. Mild bilateral ethmoid sinusitis. Mild mucosal thickening within the right frontal sinus. Severe left frontal sinusitis (with complete sinus opacification). IMPRESSION: 1. 4.3 x 4.8 cm peripherally enhancing and centrally necrotic mass at the junction of the frontal and parietal lobes on the left, as described. Moderate surrounding vasogenic edema. Local mass effect with partial effacement of the left lateral ventricle. No midline shift. 2. Additional 5 mm enhancing lesion at the junction of the posterior hippocampus and crus of fornix on the left. 3. The above described lesions are most suspicious for intracranial metastases. However, a multicentric high-grade primary CNS neoplasm (such as glioblastoma multiforme) could also have this imaging presentation. 4. Several nonspecific chronic microhemorrhages scattered within the supratentorial and infratentorial brain. 5. Background parenchymal atrophy and chronic small vessel ischemic disease. 6. Paranasal sinus  disease as described. Electronically Signed   By: Rockey Childs D.O.   On: 08/11/2023 14:24    DISCHARGE EXAMINATION: Vitals:   08/14/23 2000 08/14/23 2306 08/15/23 0328 08/15/23 0812  BP: (!) 126/56 (!) 146/73 (!) 162/68 (!) 145/58  Pulse: 61 (!) 55 (!) 51 (!) 49  Resp: 18  18 16   Temp: 97.8 F (36.6 C) 98.2 F (36.8 C) (!) 97.5 F (36.4 C) 97.9 F (36.6 C)  TempSrc: Oral Axillary Oral Oral  SpO2: 96% 99% 99% 99%  Weight:      Height:       General appearance: Awake alert.  In no distress Resp: Clear to auscultation bilaterally.  Normal effort Cardio: S1-S2 is normal regular.  No S3-S4.  No rubs murmurs or bruit GI: Abdomen is soft.  Nontender nondistended.  Bowel sounds are present normal.  No masses organomegaly  DISPOSITION: Home  Discharge Instructions     Call MD for:  difficulty breathing, headache or visual disturbances   Complete by: As directed    Call MD for:  extreme fatigue   Complete by: As directed    Call MD for:  persistant dizziness or light-headedness   Complete by: As directed    Call MD for:  persistant nausea and vomiting   Complete by: As directed    Call MD for:  severe uncontrolled pain   Complete by: As directed    Call MD for:  temperature >100.4   Complete by: As directed    Diet - low sodium heart healthy   Complete by: As directed    Discharge instructions   Complete by: As directed    Please follow instructions provided by the neurosurgeon.  Take your medications as prescribed.  Seek attention if you develop severe headaches, fever, chills, seizure type activity.  You were cared for by a hospitalist during your hospital stay. If you have any questions about your discharge medications or the care you received while you were in the hospital after you are discharged, you can call the unit and asked to speak with the hospitalist on call if the hospitalist that took care of you is not available. Once you are discharged, your primary care  physician will handle any further medical issues. Please note that  NO REFILLS for any discharge medications will be authorized once you are discharged, as it is imperative that you return to your primary care physician (or establish a relationship with a primary care physician if you do not have one) for your aftercare needs so that they can reassess your need for medications and monitor your lab values. If you do not have a primary care physician, you can call [phone] for a physician referral.   Increase activity slowly   Complete by: As directed          Allergies as of 08/15/2023       Reactions   Lotensin  [benazepril  Hcl] Cough        Medication List     STOP taking these medications    aspirin 81 MG tablet   sildenafil  20 MG tablet Commonly known as: REVATIO    verapamil  180 MG CR tablet Commonly known as: CALAN -SR       TAKE these medications    atorvastatin  20 MG tablet Commonly known as: LIPITOR Take 1 tablet (20 mg total) by mouth daily.   dexamethasone  4 MG tablet Commonly known as: DECADRON  Take 1 tablet (4 mg total) by mouth every 12 (twelve) hours.   docusate sodium  100 MG capsule Commonly known as: COLACE Take 100 mg by mouth daily as needed (constipation).   folic acid  1 MG tablet Commonly known as: FOLVITE  Take 1 tablet (1 mg total) by mouth daily.   losartan  25 MG tablet Commonly known as: COZAAR  Take 1 tablet (25 mg total) by mouth daily.   Mens 50+ Multivitamin Tabs Take 1 tablet by mouth daily.   omeprazole  20 MG capsule Commonly known as: PRILOSEC TAKE 1 CAPSULE BY MOUTH DAILY   polyethylene glycol powder 17 GM/SCOOP powder Commonly known as: GLYCOLAX /MIRALAX  Take 17 g by mouth daily as needed for mild constipation.   thiamine  100 MG tablet Commonly known as: VITAMIN B1 Take 1 tablet (100 mg total) by mouth daily.               Durable Medical Equipment  (From admission, onward)           Start     Ordered    08/14/23 1236  For home use only DME lightweight manual wheelchair with seat cushion  Once       Comments: Patient suffers from weakness which impairs their ability to perform daily activities like bathing, dressing, and grooming in the home.  A walker will not resolve  issue with performing activities of daily living. A wheelchair will allow patient to safely perform daily activities. Patient is not able to propel themselves in the home using a standard weight wheelchair due to general weakness. Patient can self propel in the lightweight wheelchair. Length of need 6 months . Accessories: elevating leg rests (ELRs), wheel locks, extensions and anti-tippers.   08/14/23 1235              Follow-up Information     [facility] Follow up.   Specialty: Home Health Services Why: [facility] will contact you for the first home visit. Contact information: [address] [place] [address] [phone]                 TOTAL DISCHARGE TIME: 35 minutes  Myley Bahner Verdene  [facility] Pager on [email or web address]  08/15/2023, 9:37 AM

## 2023-08-15 NOTE — Progress Notes (Signed)
 Tele called to report that the patient's HR went to 39.  Assessed patient.  He was asleep, easy to arouse, alert and oriented and in NAD.  HR did go up to the mid 50's once patient was awake.

## 2023-08-15 NOTE — Progress Notes (Signed)
 Occupational Therapy Treatment Patient Details Name: Noah King MRN: [id number] DOB: 01-12-1946 Today's Date: 08/15/2023   History of present illness 78 y.o. male presents to [facility] hospital on 08/11/2023 with R hemiparesis and numbness. 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 PMH includes HTN.   OT comments  Pt supine in bed and agreeable to OT.  Contact guard assist for transfers and mobility using RW, contact guard assist for grooming at sink and mod assist for toileting needs in bathroom.  Worked on side stepping into bathroom to simulate home setup, intermittent cueing for [employer].  Discussed fall prevention and recommendations for ADLs sitting or having close guarding for safety.  Will follow acutely and plan to defer OT services until after surgery next week.       If plan is discharge home, recommend the following:  Assistance with cooking/housework;Assist for transportation;A little help with walking and/or transfers;A little help with bathing/dressing/bathroom;Direct supervision/assist for medications management;Direct supervision/assist for financial management   Equipment Recommendations  None recommended by OT    Recommendations for Other Services      Precautions / Restrictions Precautions Precautions: Fall Recall of Precautions/Restrictions: Intact Restrictions Weight Bearing Restrictions Per Provider Order: No       Mobility Bed Mobility Overal bed mobility: Needs Assistance Bed Mobility: Supine to Sit     Supine to sit: Supervision     General bed mobility comments: HOB flat to simulate home setup    Transfers Overall transfer level: Needs assistance Equipment used: Rolling walker (2 wheels) Transfers: Sit to/from Stand Sit to Stand: Contact guard assist           General transfer comment: good hand placement and technique, contact guard for safety     Balance Overall balance assessment: Needs  assistance Sitting-balance support: No upper extremity supported, Feet supported Sitting balance-Leahy Scale: Good     Standing balance support: Bilateral upper extremity supported, Reliant on assistive device for balance, During functional activity, Single extremity supported Standing balance-Leahy Scale: Poor Standing balance comment: preference to 1 UE support during grooming, leaning on sink with 0 hand support. cga for safety                           ADL either performed or assessed with clinical judgement   ADL Overall ADL's : Needs assistance/impaired     Grooming: Contact guard assist;Standing;Wash/dry face;Oral care               Lower Body Dressing: Contact guard assist;Sit to/from stand   Toilet Transfer: Contact guard assist;Ambulation;Rolling walker (2 wheels);BSC/3in1 Toilet Transfer Details (indicate cue type and reason): 3:1 over toilet, side stepping into bathroom to simulate home setup Toileting- Clothing Manipulation and Hygiene: Moderate assistance;Sit to/from stand Toileting - Clothing Manipulation Details (indicate cue type and reason): clothing mgmt and hygiene after +BM     Functional mobility during ADLs: Contact guard assist;Rolling walker (2 wheels) General ADL Comments: intermittent cueing for RW mgmt    Extremity/Trunk Assessment              Vision       Perception     Praxis     Communication Communication Communication: No apparent difficulties   Cognition Arousal: Alert Behavior During Therapy: WFL for tasks assessed/performed Cognition: No apparent impairments  Following commands: Intact        Cueing   Cueing Techniques: Verbal cues  Exercises      Shoulder Instructions       General Comments discussed fall prevention and safety, pt plans to have hands on support during mobility and ADLs    Pertinent Vitals/ Pain       Pain Assessment Pain Assessment:  No/denies pain  Home Living                                          Prior Functioning/Environment              Frequency  Min 2X/week        Progress Toward Goals  OT Goals(current goals can now be found in the care plan section)  Progress towards OT goals: Progressing toward goals  Acute Rehab OT Goals Patient Stated Goal: home before surgery OT Goal Formulation: With patient Time For Goal Achievement: 08/26/23 Potential to Achieve Goals: Good  Plan      Co-evaluation                 AM-PAC OT 6 Clicks Daily Activity     Outcome Measure   Help from another person eating meals?: A Little Help from another person taking care of personal grooming?: A Little Help from another person toileting, which includes using toliet, bedpan, or urinal?: A Lot Help from another person bathing (including washing, rinsing, drying)?: A Little Help from another person to put on and taking off regular upper body clothing?: A Little Help from another person to put on and taking off regular lower body clothing?: A Little 6 Click Score: 17    End of Session Equipment Utilized During Treatment: Gait belt;Rolling walker (2 wheels)  OT Visit Diagnosis: Unsteadiness on feet (R26.81);Other abnormalities of gait and mobility (R26.89);Muscle weakness (generalized) (M62.81)   Activity Tolerance Patient tolerated treatment well   Patient Left in chair;with call bell/phone within reach;with chair alarm set   Nurse Communication Mobility status        Time: 9085-9048 OT Time Calculation (min): 37 min  Charges: OT General Charges $OT Visit: 1 Visit OT Treatments $Self Care/Home Management : 23-37 mins  Etta NOVAK, OT Acute Rehabilitation Services Office [phone] Secure Chat Preferred    Etta GORMAN Hope 08/15/2023, 10:01 AM

## 2023-08-15 NOTE — Plan of Care (Addendum)
 Discharged to home after IV access removed and instructions provided and all questions answered.    Problem: Education: Goal: Knowledge of disease or condition will improve Outcome: Adequate for Discharge Goal: Knowledge of secondary prevention will improve (MUST DOCUMENT ALL) Outcome: Adequate for Discharge Goal: Knowledge of patient specific risk factors will improve (DELETE if not current risk factor) Outcome: Adequate for Discharge   Problem: Ischemic Stroke/TIA Tissue Perfusion: Goal: Complications of ischemic stroke/TIA will be minimized Outcome: Adequate for Discharge   Problem: Coping: Goal: Will verbalize positive feelings about self Outcome: Adequate for Discharge Goal: Will identify appropriate support needs Outcome: Adequate for Discharge   Problem: Health Behavior/Discharge Planning: Goal: Ability to manage health-related needs will improve Outcome: Adequate for Discharge Goal: Goals will be collaboratively established with patient/family Outcome: Adequate for Discharge   Problem: Self-Care: Goal: Ability to participate in self-care as condition permits will improve Outcome: Adequate for Discharge Goal: Verbalization of feelings and concerns over difficulty with self-care will improve Outcome: Adequate for Discharge Goal: Ability to communicate needs accurately will improve Outcome: Adequate for Discharge   Problem: Nutrition: Goal: Risk of aspiration will decrease Outcome: Adequate for Discharge Goal: Dietary intake will improve Outcome: Adequate for Discharge   Problem: Education: Goal: Knowledge of General Education information will improve Description: Including pain rating scale, medication(s)/side effects and non-pharmacologic comfort measures Outcome: Adequate for Discharge   Problem: Health Behavior/Discharge Planning: Goal: Ability to manage health-related needs will improve Outcome: Adequate for Discharge   Problem: Clinical Measurements: Goal:  Ability to maintain clinical measurements within normal limits will improve Outcome: Adequate for Discharge Goal: Will remain free from infection Outcome: Adequate for Discharge Goal: Diagnostic test results will improve Outcome: Adequate for Discharge Goal: Respiratory complications will improve Outcome: Adequate for Discharge Goal: Cardiovascular complication will be avoided Outcome: Adequate for Discharge   Problem: Activity: Goal: Risk for activity intolerance will decrease Outcome: Adequate for Discharge   Problem: Nutrition: Goal: Adequate nutrition will be maintained Outcome: Adequate for Discharge   Problem: Coping: Goal: Level of anxiety will decrease Outcome: Adequate for Discharge   Problem: Elimination: Goal: Will not experience complications related to bowel motility Outcome: Adequate for Discharge Goal: Will not experience complications related to urinary retention Outcome: Adequate for Discharge   Problem: Pain Managment: Goal: General experience of comfort will improve and/or be controlled Outcome: Adequate for Discharge   Problem: Safety: Goal: Ability to remain free from injury will improve Outcome: Adequate for Discharge   Problem: Skin Integrity: Goal: Risk for impaired skin integrity will decrease Outcome: Adequate for Discharge

## 2023-08-15 NOTE — Progress Notes (Signed)
 Patient Noah King's into the 40's while sleeping.  All other VS are wdl.  Patient is easy to arouse, oriented and in NAD.  Dr. Franky notified.  Continue to observe.

## 2023-08-15 NOTE — TOC Transition Note (Signed)
 Transition of Care [facility]) - Discharge Note   Patient Details  Name: Noah King MRN: [id number] Date of Birth: 1945-04-09  Transition of Care [facility]) CM/SW Contact:  Andrez JULIANNA George, RN Phone Number: 08/15/2023, 10:31 AM   Clinical Narrative:     Pt is discharging home with home health through [facility]. Information on the AVS. Wheelchair for home is in the room.  Pt has transportation home.  Final next level of care: Home/Self Care Barriers to Discharge: No Barriers Identified   Patient Goals and CMS Choice   CMS Medicare.gov Compare Post Acute Care list provided to:: Patient Represenative (must comment) Choice offered to / list presented to : Adult Children      Discharge Placement                       Discharge Plan and Services Additional resources added to the After Visit Summary for     Discharge Planning Services: CM Consult Post Acute Care Choice: Home Health, Durable Medical Equipment          DME Arranged: Wheelchair manual DME Agency: [employer] Date DME Agency Contacted: 08/14/23   Representative spoke with at DME Agency: Darlyn HH Arranged: RN, Nurse's Aide HH Agency: [facility] Date [facility] Agency Contacted: 08/14/23   Representative spoke with at [facility] Agency: [place]  Social Drivers of Health (SDOH) Interventions SDOH Screenings   Food Insecurity: No Food Insecurity (08/12/2023)  Housing: Low Risk  (08/12/2023)  Transportation Needs: No Transportation Needs (08/12/2023)  Utilities: Not At Risk (08/12/2023)  Alcohol Screen: Low Risk  (11/01/2021)  Depression (PHQ2-9): Low Risk  (11/21/2022)  Financial Resource Strain: Low Risk  (11/01/2021)  Physical Activity: Insufficiently Active (11/01/2021)  Social Connections: Moderately Isolated (08/12/2023)  Stress: No Stress Concern Present (11/01/2021)  Tobacco Use: Medium Risk (08/13/2023)     Readmission Risk Interventions     No data to display

## 2023-08-16 DIAGNOSIS — R531 Weakness: Secondary | ICD-10-CM | POA: Diagnosis not present

## 2023-08-18 ENCOUNTER — Encounter

## 2023-08-18 ENCOUNTER — Encounter: Payer: Self-pay | Admitting: Surgery

## 2023-08-18 ENCOUNTER — Encounter: Admitting: Radiation Oncology

## 2023-08-18 ENCOUNTER — Other Ambulatory Visit: Payer: Self-pay

## 2023-08-18 DIAGNOSIS — I739 Peripheral vascular disease, unspecified: Secondary | ICD-10-CM

## 2023-08-19 ENCOUNTER — Encounter: Payer: Self-pay | Admitting: Radiation Oncology

## 2023-08-19 ENCOUNTER — Telehealth: Payer: Self-pay

## 2023-08-19 NOTE — Telephone Encounter (Signed)
 Copied from CRM #[id number]. Topic: Clinical - Home Health Verbal Orders >> Aug 19, 2023 12:54 PM Larissa RAMAN wrote: Caller/Agency: Charmaine, RN/ [facility]  Callback Number: [phone], ok to leave VM  Service Requested: Skilled Nursing Frequency: 1x 8  Any new concerns about the patient? No

## 2023-08-20 ENCOUNTER — Encounter: Payer: Self-pay | Admitting: Radiation Oncology

## 2023-08-20 ENCOUNTER — Encounter (HOSPITAL_COMMUNITY): Payer: Self-pay | Admitting: *Deleted

## 2023-08-20 ENCOUNTER — Other Ambulatory Visit: Payer: Self-pay

## 2023-08-20 ENCOUNTER — Other Ambulatory Visit: Payer: Self-pay | Admitting: Neurosurgery

## 2023-08-20 NOTE — Telephone Encounter (Signed)
 Attempted to call, but voicemail was full.

## 2023-08-20 NOTE — Telephone Encounter (Signed)
 Called no answer. Vm full.

## 2023-08-20 NOTE — Pre-Procedure Instructions (Signed)
-------------    SDW INSTRUCTIONS given:  Your procedure is scheduled on 8/7.  Report to [facility] Main Entrance A at 10:45 A.M., and check in at the Admitting office.  Any questions or running late day of surgery: call [phone]    Remember:  Do not eat after midnight the night before your surgery  You may drink clear liquids until 10:15 AM the morning of your surgery.   Clear liquids allowed are: Water, Non-Citrus Juices (without pulp), Carbonated Beverages, Clear Tea, Black Coffee Only, and Gatorade    Take these medicines the morning of surgery with A SIP OF WATER  Atorvastatin  Decadron  Omeprazole    As of today, STOP taking any Aspirin (unless otherwise instructed by your surgeon) Aleve, Naproxen, Ibuprofen, Motrin, Advil, Goody's, BC's, all herbal medications, fish oil, and all vitamins.   Do NOT Smoke (Tobacco/Vaping) 24 hours prior to your procedure  If you use a CPAP at night, you may bring all equipment for your overnight stay.     You will be asked to remove any contacts, glasses, piercing's, hearing aid's, dentures/partials prior to surgery. Please bring cases for these items if needed.     Patients discharged the day of surgery will not be allowed to drive home, and someone needs to stay with them for 24 hours.  SURGICAL WAITING ROOM VISITATION Patients may have no more than 2 support people in the waiting area - these visitors may rotate.   Pre-op nurse will coordinate an appropriate time for 1 ADULT support person, who may not rotate, to accompany patient in pre-op.  Children under the age of 42 must have an adult with them who is not the patient and must remain in the main waiting area with an adult.  If the patient needs to stay at the hospital during part of their recovery, the visitor guidelines for inpatient rooms apply.  Please refer to the [facility] website for the visitor guidelines for any additional information.   Special instructions:   [facility]- Preparing For Surgery   Please follow these instructions carefully.   Shower the NIGHT BEFORE SURGERY and the MORNING OF SURGERY with DIAL Soap.   Pat yourself dry with a CLEAN TOWEL.  Wear CLEAN PAJAMAS to bed the night before surgery  Place CLEAN SHEETS on your bed the night of your first shower and DO NOT SLEEP WITH PETS.   Additional instructions for the day of surgery: DO NOT APPLY any lotions, deodorants, cologne, or perfumes.   Do not wear jewelry or makeup Do not wear nail polish, gel polish, artificial nails, or any other type of covering on natural nails (fingers and toes) Do not bring valuables to the hospital. [facility] is not responsible for valuables/personal belongings. Put on clean/comfortable clothes.  Please brush your teeth.  Ask your nurse before applying any prescription medications to the skin.

## 2023-08-20 NOTE — Progress Notes (Signed)
 PCP - Darleene Shape, NP Cardiologist - denies  PPM/ICD - denies   Chest x-ray - 08/11/23 EKG - 08/11/23 Stress Test - denies ECHO - denies Cardiac Cath - denies  CPAP - denies  DM- denies  ASA/Blood Thinner Instructions: n/a   ERAS Protcol - clears until 1015  COVID TEST- n/a  Anesthesia review: no  Patient verbally denies any shortness of breath, fever, cough and chest pain during phone call      Questions were answered. Patient verbalized understanding of instructions.

## 2023-08-21 ENCOUNTER — Inpatient Hospital Stay (HOSPITAL_COMMUNITY): Payer: Self-pay | Admitting: Anesthesiology

## 2023-08-21 ENCOUNTER — Inpatient Hospital Stay (HOSPITAL_COMMUNITY)
Admission: RE | Admit: 2023-08-21 | Discharge: 2023-08-23 | DRG: 025 | Disposition: A | Discharge status: Home Health | Attending: Neurosurgery | Admitting: Neurosurgery

## 2023-08-21 ENCOUNTER — Other Ambulatory Visit: Payer: Self-pay

## 2023-08-21 ENCOUNTER — Encounter (HOSPITAL_COMMUNITY): Payer: Self-pay

## 2023-08-21 ENCOUNTER — Telehealth: Payer: Self-pay

## 2023-08-21 ENCOUNTER — Inpatient Hospital Stay (HOSPITAL_COMMUNITY)
Admission: RE | Disposition: A | Discharge status: Home Health | Payer: Self-pay | Source: Home / Self Care | Attending: Neurosurgery

## 2023-08-21 DIAGNOSIS — Z888 Allergy status to other drugs, medicaments and biological substances status: Secondary | ICD-10-CM

## 2023-08-21 DIAGNOSIS — E785 Hyperlipidemia, unspecified: Secondary | ICD-10-CM | POA: Diagnosis present

## 2023-08-21 DIAGNOSIS — Z9889 Other specified postprocedural states: Secondary | ICD-10-CM | POA: Diagnosis not present

## 2023-08-21 DIAGNOSIS — R531 Weakness: Secondary | ICD-10-CM | POA: Diagnosis not present

## 2023-08-21 DIAGNOSIS — C3491 Malignant neoplasm of unspecified part of right bronchus or lung: Secondary | ICD-10-CM | POA: Diagnosis not present

## 2023-08-21 DIAGNOSIS — K219 Gastro-esophageal reflux disease without esophagitis: Secondary | ICD-10-CM | POA: Diagnosis not present

## 2023-08-21 DIAGNOSIS — D496 Neoplasm of unspecified behavior of brain: Secondary | ICD-10-CM | POA: Diagnosis not present

## 2023-08-21 DIAGNOSIS — Z800 Family history of malignant neoplasm of digestive organs: Secondary | ICD-10-CM | POA: Diagnosis not present

## 2023-08-21 DIAGNOSIS — E039 Hypothyroidism, unspecified: Secondary | ICD-10-CM

## 2023-08-21 DIAGNOSIS — C718 Malignant neoplasm of overlapping sites of brain: Secondary | ICD-10-CM | POA: Diagnosis not present

## 2023-08-21 DIAGNOSIS — Z87891 Personal history of nicotine dependence: Secondary | ICD-10-CM

## 2023-08-21 DIAGNOSIS — Z823 Family history of stroke: Secondary | ICD-10-CM | POA: Diagnosis not present

## 2023-08-21 DIAGNOSIS — Z803 Family history of malignant neoplasm of breast: Secondary | ICD-10-CM

## 2023-08-21 DIAGNOSIS — G9389 Other specified disorders of brain: Secondary | ICD-10-CM

## 2023-08-21 DIAGNOSIS — R918 Other nonspecific abnormal finding of lung field: Secondary | ICD-10-CM | POA: Diagnosis not present

## 2023-08-21 DIAGNOSIS — C801 Malignant (primary) neoplasm, unspecified: Secondary | ICD-10-CM | POA: Diagnosis not present

## 2023-08-21 DIAGNOSIS — I10 Essential (primary) hypertension: Secondary | ICD-10-CM | POA: Diagnosis present

## 2023-08-21 DIAGNOSIS — Z01818 Encounter for other preprocedural examination: Principal | ICD-10-CM

## 2023-08-21 DIAGNOSIS — C7931 Secondary malignant neoplasm of brain: Principal | ICD-10-CM | POA: Diagnosis present

## 2023-08-21 DIAGNOSIS — Z79899 Other long term (current) drug therapy: Secondary | ICD-10-CM | POA: Diagnosis not present

## 2023-08-21 DIAGNOSIS — G935 Compression of brain: Secondary | ICD-10-CM | POA: Diagnosis not present

## 2023-08-21 DIAGNOSIS — D45 Polycythemia vera: Secondary | ICD-10-CM | POA: Diagnosis not present

## 2023-08-21 DIAGNOSIS — C3431 Malignant neoplasm of lower lobe, right bronchus or lung: Secondary | ICD-10-CM | POA: Diagnosis not present

## 2023-08-21 HISTORY — PX: APPLICATION OF CRANIAL NAVIGATION: SHX6578

## 2023-08-21 HISTORY — PX: CRANIOTOMY: SHX93

## 2023-08-21 LAB — POCT I-STAT 7, (LYTES, BLD GAS, ICA,H+H)
Acid-base deficit: 4 mmol/L — ABNORMAL HIGH (ref 0.0–2.0)
Bicarbonate: 20.8 mmol/L (ref 20.0–28.0)
Calcium, Ion: 1.17 mmol/L (ref 1.15–1.40)
HCT: 34 % — ABNORMAL LOW (ref 39.0–52.0)
Hemoglobin: 11.6 g/dL — ABNORMAL LOW (ref 13.0–17.0)
O2 Saturation: 100 %
Potassium: 3.8 mmol/L (ref 3.5–5.1)
Sodium: 137 mmol/L (ref 135–145)
TCO2: 22 mmol/L (ref 22–32)
pCO2 arterial: 35.3 mmHg (ref 32–48)
pH, Arterial: 7.378 (ref 7.35–7.45)
pO2, Arterial: 288 mmHg — ABNORMAL HIGH (ref 83–108)

## 2023-08-21 LAB — ABO/RH: ABO/RH(D): O NEG

## 2023-08-21 LAB — TYPE AND SCREEN
ABO/RH(D): O NEG
Antibody Screen: NEGATIVE

## 2023-08-21 LAB — MRSA NEXT GEN BY PCR, NASAL: MRSA by PCR Next Gen: NOT DETECTED

## 2023-08-21 MED ORDER — PROMETHAZINE HCL 25 MG PO TABS: 12.5000 mg | ORAL_TABLET | ORAL | Status: DC | PRN

## 2023-08-21 MED ORDER — ACETAMINOPHEN 650 MG RE SUPP: 650.0000 mg | RECTAL | Status: DC | PRN

## 2023-08-21 MED ORDER — THROMBIN 20000 UNITS EX SOLR
CUTANEOUS | Status: AC
  Filled 2023-08-21: qty 20000

## 2023-08-21 MED ORDER — HYDROCODONE-ACETAMINOPHEN 5-325 MG PO TABS: 1.0000 | ORAL_TABLET | ORAL | Status: DC | PRN

## 2023-08-21 MED ORDER — LIDOCAINE 2% (20 MG/ML) 5 ML SYRINGE
INTRAMUSCULAR | Status: DC | PRN
  Administered 2023-08-21: 60 mg via INTRAVENOUS

## 2023-08-21 MED ORDER — ACETAMINOPHEN 325 MG PO TABS
650.0000 mg | ORAL_TABLET | ORAL | Status: DC | PRN
Start: 2023-08-21 — End: 2023-08-23

## 2023-08-21 MED ORDER — LABETALOL HCL 5 MG/ML IV SOLN
5.0000 mg | INTRAVENOUS | Status: DC | PRN
  Administered 2023-08-21 (×2): 5 mg via INTRAVENOUS

## 2023-08-21 MED ORDER — ATORVASTATIN CALCIUM 10 MG PO TABS
20.0000 mg | ORAL_TABLET | Freq: Every day | ORAL | Status: DC
Start: 2023-08-22 — End: 2023-08-23
  Administered 2023-08-22 – 2023-08-23 (×2): 20 mg via ORAL
  Filled 2023-08-21 (×2): qty 2

## 2023-08-21 MED ORDER — LOSARTAN POTASSIUM 50 MG PO TABS
25.0000 mg | ORAL_TABLET | Freq: Every day | ORAL | Status: DC
  Administered 2023-08-21 – 2023-08-23 (×3): 25 mg via ORAL
  Filled 2023-08-21 (×3): qty 1

## 2023-08-21 MED ORDER — DEXAMETHASONE 4 MG PO TABS: 2.0000 mg | ORAL_TABLET | Freq: Two times a day (BID) | ORAL | Status: DC

## 2023-08-21 MED ORDER — LEVETIRACETAM (KEPPRA) 500 MG/5 ML ADULT IV PUSH
500.0000 mg | Freq: Two times a day (BID) | INTRAVENOUS | Status: DC
  Administered 2023-08-21 – 2023-08-23 (×4): 500 mg via INTRAVENOUS
  Filled 2023-08-21 (×4): qty 5

## 2023-08-21 MED ORDER — SODIUM CHLORIDE 0.9 % IV SOLN: INTRAVENOUS | Status: DC

## 2023-08-21 MED ORDER — THROMBIN 20000 UNITS EX SOLR
CUTANEOUS | Status: DC | PRN
  Administered 2023-08-21 (×2): 20 mL via TOPICAL

## 2023-08-21 MED ORDER — BACITRACIN ZINC 500 UNIT/GM EX OINT
TOPICAL_OINTMENT | CUTANEOUS | Status: AC
  Filled 2023-08-21: qty 28.35

## 2023-08-21 MED ORDER — LIDOCAINE-EPINEPHRINE 1 %-1:100000 IJ SOLN
INTRAMUSCULAR | Status: AC
  Filled 2023-08-21: qty 1

## 2023-08-21 MED ORDER — ORAL CARE MOUTH RINSE: 15.0000 mL | OROMUCOSAL | Status: DC | PRN

## 2023-08-21 MED ORDER — DEXAMETHASONE 4 MG PO TABS: 2.0000 mg | ORAL_TABLET | Freq: Every day | ORAL | Status: DC

## 2023-08-21 MED ORDER — 0.9 % SODIUM CHLORIDE (POUR BTL) OPTIME
TOPICAL | Status: DC | PRN
Start: 2023-08-21 — End: 2023-08-21
  Administered 2023-08-21: 3000 mL

## 2023-08-21 MED ORDER — FOLIC ACID 1 MG PO TABS
1.0000 mg | ORAL_TABLET | Freq: Every day | ORAL | Status: DC
  Administered 2023-08-21 – 2023-08-23 (×3): 1 mg via ORAL
  Filled 2023-08-21 (×3): qty 1

## 2023-08-21 MED ORDER — SODIUM CHLORIDE 0.9 % IV SOLN
0.1500 ug/kg/min | INTRAVENOUS | Status: AC
  Administered 2023-08-21: .05 ug/kg/min via INTRAVENOUS
  Filled 2023-08-21: qty 2000

## 2023-08-21 MED ORDER — DEXAMETHASONE 4 MG PO TABS
4.0000 mg | ORAL_TABLET | Freq: Four times a day (QID) | ORAL | Status: DC
  Administered 2023-08-21 – 2023-08-23 (×7): 4 mg via ORAL
  Filled 2023-08-21 (×7): qty 1

## 2023-08-21 MED ORDER — PANTOPRAZOLE SODIUM 40 MG PO TBEC
40.0000 mg | DELAYED_RELEASE_TABLET | Freq: Every day | ORAL | Status: DC
  Administered 2023-08-22 – 2023-08-23 (×2): 40 mg via ORAL
  Filled 2023-08-21 (×2): qty 1

## 2023-08-21 MED ORDER — CLEVIDIPINE BUTYRATE 0.5 MG/ML IV EMUL
INTRAVENOUS | Status: AC
  Filled 2023-08-21: qty 50

## 2023-08-21 MED ORDER — DEXAMETHASONE 4 MG PO TABS: 4.0000 mg | ORAL_TABLET | Freq: Two times a day (BID) | ORAL | Status: DC

## 2023-08-21 MED ORDER — DOCUSATE SODIUM 100 MG PO CAPS
100.0000 mg | ORAL_CAPSULE | Freq: Two times a day (BID) | ORAL | Status: DC
  Administered 2023-08-21 – 2023-08-23 (×4): 100 mg via ORAL
  Filled 2023-08-21 (×4): qty 1

## 2023-08-21 MED ORDER — ACETAMINOPHEN 500 MG PO TABS
1000.0000 mg | ORAL_TABLET | Freq: Once | ORAL | Status: AC
  Administered 2023-08-21: 1000 mg via ORAL
  Filled 2023-08-21: qty 2

## 2023-08-21 MED ORDER — ALBUMIN HUMAN 5 % IV SOLN
INTRAVENOUS | Status: DC | PRN
Start: 2023-08-21 — End: 2023-08-21

## 2023-08-21 MED ORDER — FENTANYL CITRATE (PF) 100 MCG/2ML IJ SOLN: 25.0000 ug | INTRAMUSCULAR | Status: DC | PRN

## 2023-08-21 MED ORDER — OXYCODONE HCL 5 MG/5ML PO SOLN: 5.0000 mg | Freq: Once | ORAL | Status: DC | PRN

## 2023-08-21 MED ORDER — PHENYLEPHRINE 80 MCG/ML (10ML) SYRINGE FOR IV PUSH (FOR BLOOD PRESSURE SUPPORT)
PREFILLED_SYRINGE | INTRAVENOUS | Status: DC | PRN
  Administered 2023-08-21: 40 ug via INTRAVENOUS

## 2023-08-21 MED ORDER — ORAL CARE MOUTH RINSE: 15.0000 mL | Freq: Once | OROMUCOSAL | Status: AC

## 2023-08-21 MED ORDER — LABETALOL HCL 5 MG/ML IV SOLN: 10.0000 mg | INTRAVENOUS | Status: DC | PRN

## 2023-08-21 MED ORDER — OXYCODONE HCL 5 MG PO TABS: 5.0000 mg | ORAL_TABLET | Freq: Once | ORAL | Status: DC | PRN

## 2023-08-21 MED ORDER — DEXAMETHASONE SODIUM PHOSPHATE 10 MG/ML IJ SOLN
INTRAMUSCULAR | Status: DC | PRN
  Administered 2023-08-21: 10 mg via INTRAVENOUS

## 2023-08-21 MED ORDER — POLYETHYLENE GLYCOL 3350 17 G PO PACK: 17.0000 g | PACK | Freq: Every day | ORAL | Status: DC | PRN

## 2023-08-21 MED ORDER — ONDANSETRON HCL 4 MG/2ML IJ SOLN
4.0000 mg | INTRAMUSCULAR | Status: DC | PRN
Start: 2023-08-21 — End: 2023-08-23

## 2023-08-21 MED ORDER — LIDOCAINE-EPINEPHRINE 1 %-1:100000 IJ SOLN
INTRAMUSCULAR | Status: DC | PRN
  Administered 2023-08-21: 7 mL

## 2023-08-21 MED ORDER — CHLORHEXIDINE GLUCONATE 0.12 % MT SOLN
15.0000 mL | Freq: Once | OROMUCOSAL | Status: AC
  Administered 2023-08-21: 15 mL via OROMUCOSAL
  Filled 2023-08-21: qty 15

## 2023-08-21 MED ORDER — HYDRALAZINE HCL 20 MG/ML IJ SOLN
10.0000 mg | INTRAMUSCULAR | Status: DC | PRN
  Administered 2023-08-21: 40 mg via INTRAVENOUS
  Filled 2023-08-21 (×2): qty 1

## 2023-08-21 MED ORDER — AMISULPRIDE (ANTIEMETIC) 5 MG/2ML IV SOLN: 10.0000 mg | Freq: Once | INTRAVENOUS | Status: DC | PRN

## 2023-08-21 MED ORDER — FENTANYL CITRATE (PF) 250 MCG/5ML IJ SOLN
INTRAMUSCULAR | Status: AC
  Filled 2023-08-21: qty 5

## 2023-08-21 MED ORDER — EPHEDRINE SULFATE-NACL 50-0.9 MG/10ML-% IV SOSY
PREFILLED_SYRINGE | INTRAVENOUS | Status: DC | PRN
  Administered 2023-08-21: 5 mg via INTRAVENOUS

## 2023-08-21 MED ORDER — FENTANYL CITRATE (PF) 250 MCG/5ML IJ SOLN
INTRAMUSCULAR | Status: DC | PRN
  Administered 2023-08-21 (×2): 50 ug via INTRAVENOUS
  Administered 2023-08-21: 100 ug via INTRAVENOUS
  Administered 2023-08-21: 50 ug via INTRAVENOUS

## 2023-08-21 MED ORDER — LABETALOL HCL 5 MG/ML IV SOLN
INTRAVENOUS | Status: AC
  Filled 2023-08-21: qty 4

## 2023-08-21 MED ORDER — CHLORHEXIDINE GLUCONATE CLOTH 2 % EX PADS
6.0000 | MEDICATED_PAD | Freq: Every day | CUTANEOUS | Status: DC
  Administered 2023-08-21 – 2023-08-22 (×2): 6 via TOPICAL

## 2023-08-21 MED ORDER — SODIUM CHLORIDE 0.9 % IV SOLN
INTRAVENOUS | Status: DC | PRN
  Administered 2023-08-21: 1000 mg via INTRAVENOUS

## 2023-08-21 MED ORDER — THROMBIN 5000 UNITS EX KIT
PACK | CUTANEOUS | Status: AC
  Filled 2023-08-21: qty 1

## 2023-08-21 MED ORDER — CEFAZOLIN SODIUM-DEXTROSE 2-4 GM/100ML-% IV SOLN
2.0000 g | Freq: Three times a day (TID) | INTRAVENOUS | Status: DC
  Administered 2023-08-21 – 2023-08-23 (×5): 2 g via INTRAVENOUS
  Filled 2023-08-21 (×5): qty 100

## 2023-08-21 MED ORDER — THROMBIN 20000 UNITS EX SOLR
CUTANEOUS | Status: AC
Start: 2023-08-21 — End: 2023-08-21
  Filled 2023-08-21: qty 20000

## 2023-08-21 MED ORDER — ONDANSETRON HCL 4 MG PO TABS
4.0000 mg | ORAL_TABLET | ORAL | Status: DC | PRN
Start: 2023-08-21 — End: 2023-08-23

## 2023-08-21 MED ORDER — THROMBIN 5000 UNITS EX SOLR
OROMUCOSAL | Status: DC | PRN
  Administered 2023-08-21: 5 mL via TOPICAL

## 2023-08-21 MED ORDER — CEFAZOLIN SODIUM-DEXTROSE 1-4 GM/50ML-% IV SOLN
INTRAVENOUS | Status: DC | PRN
  Administered 2023-08-21: 2 g via INTRAVENOUS

## 2023-08-21 MED ORDER — FLEET ENEMA RE ENEM: 1.0000 | ENEMA | Freq: Once | RECTAL | Status: DC | PRN

## 2023-08-21 MED ORDER — SODIUM CHLORIDE 0.9 % IV SOLN: INTRAVENOUS | Status: AC

## 2023-08-21 MED ORDER — THIAMINE MONONITRATE 100 MG PO TABS
100.0000 mg | ORAL_TABLET | Freq: Every day | ORAL | Status: DC
  Administered 2023-08-21 – 2023-08-23 (×3): 100 mg via ORAL
  Filled 2023-08-21 (×4): qty 1

## 2023-08-21 MED ORDER — PROPOFOL 500 MG/50ML IV EMUL
INTRAVENOUS | Status: AC
Start: 2023-08-21 — End: 2023-08-21
  Filled 2023-08-21: qty 50

## 2023-08-21 MED ORDER — PROPOFOL 10 MG/ML IV BOLUS
INTRAVENOUS | Status: DC | PRN
  Administered 2023-08-21: 140 mg via INTRAVENOUS
  Administered 2023-08-21: 80 mg via INTRAVENOUS

## 2023-08-21 MED ORDER — CLEVIDIPINE BUTYRATE 0.5 MG/ML IV EMUL
INTRAVENOUS | Status: DC | PRN
  Administered 2023-08-21: 3 mg/h via INTRAVENOUS

## 2023-08-21 MED ORDER — PHENYLEPHRINE HCL-NACL 20-0.9 MG/250ML-% IV SOLN
INTRAVENOUS | Status: DC | PRN
  Administered 2023-08-21: 10 ug/min via INTRAVENOUS

## 2023-08-21 MED ORDER — DEXAMETHASONE 0.5 MG PO TABS: 1.0000 mg | ORAL_TABLET | Freq: Every day | ORAL | Status: DC

## 2023-08-21 MED ORDER — HEMOSTATIC AGENTS (NO CHARGE) OPTIME
TOPICAL | Status: DC | PRN
  Administered 2023-08-21: 1 via TOPICAL

## 2023-08-21 MED ORDER — DEXAMETHASONE 4 MG PO TABS
4.0000 mg | ORAL_TABLET | Freq: Three times a day (TID) | ORAL | Status: DC
Start: 2023-08-23 — End: 2023-08-23

## 2023-08-21 MED ORDER — LEVETIRACETAM 500 MG/5ML IV SOLN
INTRAVENOUS | Status: AC
  Filled 2023-08-21: qty 10

## 2023-08-21 MED ORDER — MORPHINE SULFATE (PF) 2 MG/ML IV SOLN: 1.0000 mg | INTRAVENOUS | Status: DC | PRN

## 2023-08-21 MED ORDER — MANNITOL 20 % IV SOLN: INTRAVENOUS | Status: DC | PRN

## 2023-08-21 MED ORDER — SUCCINYLCHOLINE CHLORIDE 200 MG/10ML IV SOSY
PREFILLED_SYRINGE | INTRAVENOUS | Status: DC | PRN
  Administered 2023-08-21: 120 mg via INTRAVENOUS

## 2023-08-21 MED ORDER — PROPOFOL 500 MG/50ML IV EMUL
INTRAVENOUS | Status: DC | PRN
Start: 2023-08-21 — End: 2023-08-21
  Administered 2023-08-21: 150 ug/kg/min via INTRAVENOUS

## 2023-08-21 MED ORDER — ONDANSETRON HCL 4 MG/2ML IJ SOLN: 4.0000 mg | Freq: Once | INTRAMUSCULAR | Status: DC | PRN

## 2023-08-21 MED ORDER — PROPOFOL 1000 MG/100ML IV EMUL
INTRAVENOUS | Status: AC
  Filled 2023-08-21: qty 200

## 2023-08-21 SURGICAL SUPPLY — 2 items
NDL HYPO 22X1.5 SAFETY MO (MISCELLANEOUS) ×1 IMPLANT
NDL SPNL 18GX3.5 QUINCKE PK (NEEDLE) IMPLANT

## 2023-08-21 NOTE — Plan of Care (Signed)
  Problem: Education: Goal: Knowledge of General Education information will improve Description: Including pain rating scale, medication(s)/side effects and non-pharmacologic comfort measures Outcome: Progressing   Problem: Health Behavior/Discharge Planning: Goal: Ability to manage health-related needs will improve Outcome: Progressing   Problem: Clinical Measurements: Goal: Ability to maintain clinical measurements within normal limits will improve Outcome: Progressing Goal: Will remain free from infection Outcome: Progressing Goal: Diagnostic test results will improve Outcome: Progressing Goal: Respiratory complications will improve Outcome: Progressing Goal: Cardiovascular complication will be avoided Outcome: Progressing   Problem: Activity: Goal: Risk for activity intolerance will decrease Outcome: Progressing   Problem: Nutrition: Goal: Adequate nutrition will be maintained Outcome: Progressing   Problem: Coping: Goal: Level of anxiety will decrease Outcome: Progressing   Problem: Elimination: Goal: Will not experience complications related to bowel motility Outcome: Progressing Goal: Will not experience complications related to urinary retention Outcome: Progressing   Problem: Pain Managment: Goal: General experience of comfort will improve and/or be controlled Outcome: Progressing   Problem: Safety: Goal: Ability to remain free from injury will improve Outcome: Progressing   Problem: Skin Integrity: Goal: Risk for impaired skin integrity will decrease Outcome: Progressing   Problem: Education: Goal: Knowledge of the prescribed therapeutic regimen will improve Outcome: Progressing   Problem: Clinical Measurements: Goal: Usual level of consciousness will be regained or maintained. Outcome: Progressing Goal: Neurologic status will improve Outcome: Progressing Goal: Ability to maintain intracranial pressure will improve Outcome: Progressing    Problem: Skin Integrity: Goal: Demonstration of wound healing without infection will improve Outcome: Progressing

## 2023-08-21 NOTE — H&P (Addendum)
 CC: R sided weakness  HPI:     Patient is a 78 y.o. male presented with right sided weakness and proprioception loss and was found to have a large left parietal cystic mass in the left somatosensory cortex concerning for malignancy as well as .  He is here for resection.    Patient Active Problem List   Diagnosis Date Noted   Mediastinal lymphadenopathy 08/13/2023   Mass, brain 08/12/2023   Right sided weakness 08/11/2023   Herpes zoster 12/13/2009   Polycythemia vera (HCC) 04/16/2007   Contracture of palmar fascia 04/16/2007   Hypothyroidism 08/29/2006   Hyperlipidemia 08/29/2006   Essential hypertension 08/29/2006   Past Medical History:  Diagnosis Date   Allergy    Cataract    left  extraction- right eye forming   Dupuytren's contracture    GERD (gastroesophageal reflux disease)    Hiatal hernia    past hx-  suspect at one time   Hypertension    Polycythemia     Past Surgical History:  Procedure Laterality Date   cataract extraction left eye     COLONOSCOPY     DUPUYTREN CONTRACTURE RELEASE Left    POLYPECTOMY     rt knee arthroscopic     VIDEO BRONCHOSCOPY WITH ENDOBRONCHIAL ULTRASOUND Right 08/13/2023   Procedure: BRONCHOSCOPY, WITH EBUS;  Surgeon: Jude Harden GAILS, MD;  Location: [facility] ENDOSCOPY;  Service: Cardiopulmonary;  Laterality: Right;    No medications prior to admission.   Allergies  Allergen Reactions   Lotensin  [Benazepril  Hcl] Cough    Social History   Tobacco Use   Smoking status: Former    Current packs/day: 0.00    Types: Cigarettes    Quit date: 01/14/1985    Years since quitting: 38.6   Smokeless tobacco: Never  Substance Use Topics   Alcohol use: Yes    Alcohol/week: 20.0 standard drinks of alcohol    Types: 20 Cans of beer per week    Family History  Problem Relation Age of Onset   Stroke Mother    Colon cancer Father    Breast cancer Sister    Cancer Sister        breast    Colon polyps Neg Hx    Esophageal cancer Neg Hx       Review of Systems Pertinent items are noted in HPI.  Objective:   No data found. No intake/output data recorded. No intake/output data recorded.      General : Alert, cooperative, no distress, appears stated age   Head:  Normocephalic/atraumatic    Eyes: PERRL, conjunctiva/corneas clear, EOM's intact. Fundi could not be visualized Neck: Supple Chest:  Respirations unlabored Chest wall: no tenderness or deformity Heart: Regular rate and rhythm Abdomen: Soft, nontender and nondistended Extremities: warm and well-perfused Skin: normal turgor, color and texture Neurologic:  Alert, oriented x 3.  Eyes open spontaneously. PERRL, EOMI, VFC, no facial droop. V1-3 intact.  No dysarthria, tongue protrusion symmetric.  CNII-XII intact. Decreased proper perception in right lower extremity with dysesthesia. 4/5 right lower extremity motor strength,        Data ReviewCBC:  Lab Results  Component Value Date   WBC 9.2 08/14/2023   RBC 4.40 08/14/2023   BMP:  Lab Results  Component Value Date   GLUCOSE 126 (H) 08/14/2023   CO2 25 08/14/2023   BUN 18 08/14/2023   CREATININE 1.00 08/14/2023   CREATININE 0.78 12/08/2019   CALCIUM  9.0 08/14/2023   Radiology review:  4.8 cm Cystic  mass with rim enhancement in left somatosensory cortex.  Small enhancing lesion in left posterior mesial temporal lobe.  Assessment:   Active Problems:   * No active hospital problems. 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  Plan:   I had a long discussion with patient and family and discussed recommendation of surgical resection of the large cystic mass in his left somatosensory cortex for purposes of diagnosis, relief of mass effect, and oncologic control. I discussed that we would perform motor mapping during the surgery and we would expect likely transient worsening of right hemiparesis postop.  I discussed in detail the procedure with the  patient.  Possibility of craniectomy, ventricular or lumbar drain placement, and central line placement was also discussed. Risks, benefits, alternatives, and expected convalescence was discussed.  Risks discussed included but were not limited to bleeding, pain, infection, seizure, stroke, scar, recurrence, cerebrospinal fluid leak, neurologic deficit, paralysis, coma and death.  I also discussed with her the possibility of blood transfusion during surgery and consent to transfuse blood products if necessary was also obtained.  All questions and concerns were answered and understanding and agreement with the plan was verbalized.

## 2023-08-21 NOTE — Anesthesia Procedure Notes (Signed)
 Arterial Line Insertion Start/End8/07/2023 12:15 PM, 08/21/2023 12:30 PM Performed by: Virgil Ee, CRNA, CRNA  Patient location: Pre-op. Preanesthetic checklist: patient identified, IV checked, site marked, risks and benefits discussed, surgical consent, monitors and equipment checked, pre-op evaluation, timeout performed and anesthesia consent Lidocaine  1% used for infiltration radial was placed Catheter size: 20 G Hand hygiene performed  and maximum sterile barriers used   Attempts: 1 Procedure performed without using ultrasound guided technique. Following insertion, dressing applied and Biopatch. Post procedure assessment: normal and unchanged  Patient tolerated the procedure well with no immediate complications.

## 2023-08-21 NOTE — Anesthesia Postprocedure Evaluation (Signed)
 Anesthesia Post Note  Patient: Noah King  Procedure(s) Performed: CRANIOTOMY TUMOR EXCISION (Left) COMPUTER-ASSISTED NAVIGATION, FOR CRANIAL PROCEDURE (Left)     Patient location during evaluation: PACU Anesthesia Type: General Level of consciousness: awake and alert, oriented and patient cooperative Pain management: pain level controlled Vital Signs Assessment: post-procedure vital signs reviewed and stable Respiratory status: spontaneous breathing, nonlabored ventilation, respiratory function stable and patient connected to nasal cannula oxygen Cardiovascular status: blood pressure returned to baseline and stable Postop Assessment: no apparent nausea or vomiting Anesthetic complications: no   No notable events documented.  Last Vitals:  Vitals:   08/21/23 1730 08/21/23 1800  BP: (!) 117/92 (!) 164/71  Pulse: 86 78  Resp: 15 15  Temp:  36.4 C  SpO2: 95% 94%    Last Pain:  Vitals:   08/21/23 1800  TempSrc:   PainSc: 0-No pain                 Ashok Sawaya,E. Tangee Marszalek

## 2023-08-21 NOTE — Progress Notes (Signed)
 eLink Physician-Brief Progress Note Patient Name: Noah King DOB: [birth year] MRN: [id number]   Date of Service  08/21/2023  HPI/Events of Note  L sisded cystic mass s/p Craniotomy and tumor excision  Goal MAP 70-90  eICU Interventions  Adjusted PRN sigs to reflect goals     Intervention Category Minor Interventions: Routine modifications to care plan (e.g. PRN medications for pain, fever)  Jazleen Robeck 08/21/2023, 7:49 PM

## 2023-08-21 NOTE — Telephone Encounter (Signed)
 Verbal orders given to [facility].

## 2023-08-21 NOTE — Op Note (Signed)
 Procedure(s): CRANIOTOMY TUMOR EXCISION COMPUTER-ASSISTED NAVIGATION, FOR CRANIAL PROCEDURE  Motor mapping with ECOG, DCS, subcortical stimulation  Procedure Note  Noah King male 78 y.o. 08/21/2023  Procedure(s) and Anesthesia Type:    * CRANIOTOMY TUMOR EXCISION - General    * COMPUTER-ASSISTED NAVIGATION, FOR CRANIAL PROCEDURE - General  Surgeons and Role:    DEWAINE Ned, Dorn MATSU, MD - Primary    * Darnella Dorn SAUNDERS, MD - Assisting   Indications:This is a 78 y.o. male who was found to have a right sided weakness and proprioception loss and was found to have a large left parietal cystic mass in the left somatosensory cortex concerning for malignancy as well as a left mesial temporal lesion.  Lung biopsy was inconclusive.  I had a long discussion with the patient and family and craniotomy for resection of the mass was recommended for diagnostic and therapeutic purposes.  Risks, benefits, alternatives, and expected convalescence were discussed with the patient and family.  Risks discussed included, but were not limited to, bleeding, pain, infection, seizure, scar, stroke, neurologic deficit, recurrence, and death.  Informed consent was obtained.    Surgeon: Dorn MATSU Ned   Assistants: Dorn Edelson, MD, and Camie Pickle, PA.  Please note there are no qualified trainees available to assist with the procedure.  Assistance was provided through the entirety of the surgery.  Anesthesia: General endotracheal anesthesia   Procedure Detail  1.  Left craniotomy for resection of brain tumor 2 . computer-assisted neuronavigation 3.  Motor mapping with phase reversal SSEP, electrocorticography, direct cortical stimulation and subcortical stimulation  The patient was brought to the operating room.  A timeout was performed.  General anesthesia was induced and patient was intubated by the anesthesia service.  After appropriate lines and monitors were placed, patient's head was  turned and head was placed in [place] and affixed to the bed.  Preoperative thin cut MRI was reconstructed into a 3D image and was used to perform surface match registration with the Medtronic Stealth system.  Incision was planned using neuronavigation.  Baseline SSEPs and MEP's were obtained.  Scalp was shaved, preprepped with alcohol and cleansing solution and prepped and draped in sterile fashion.  1% lidocaine  with epinephrine  injected into the planned incision.  A curvilinear incision was made with a 10 blade and monopolar electrocautery was used to open the galea.  The periosteum was dissected off the skull and self-retaining retractors were placed.  2 bur holes were placed just lateral to the sagittal sinus and an additional bur hole was placed more laterally and were used to dissect the dura from the inner table of the skull.  Craniotome was used to perform a craniotomy.  Meticulous epidural hemostasis obtained.  The middle meningeal artery was identified and coagulated.  The dura was then opened in C-shaped manner and flapped towards the sinus.  Was inspected and a swollen gyrus was identified.  Phase reversal SSEPs were performed to confirm suspicion of the central sulcus And confirmed the the location of the mass in the somatosensory cortex.  I then placed a grid on the cortex more posteriorly and direct cortical stimulation was performed.  This confirmed the motor cortex just anterior to the involved gyrus.  The involved gyrus was then coagulated with bipolar electrocautery and corticotomy performed with microscissors.  The tumor was encountered just deep to the gray matter.  Extracapsular dissection was then performed posteriorly.  The deep part of the tumor was quite cystic but good margin was  achieved posteriorly.  Anteriorly, subcortical stimulation was performed and there was close proximity of the corticospinal tracts.  As such, the anterior aspect of the tumor was truncated and then the  remaining anterior portion of the tumor was resected more piecemeal using direct subcortical stimulation to ensure integrity of the corticospinal tracts.  Frozen section was sent and returned poorly differentiated carcinoma.  At the area of the cyst in the anterior and deep aspect of the tumor, a thin layer of gliotic tissue was left given the proximity of the corticospinal tracts on subcortical stimulation.  No remnant tumor was visible.  Motor evoked potentials showed no changes in MEP's compared with baseline.  Meticulous hemostasis was obtained.  The dura was closed with 4-0 Nurolon in a running fashion with dural spray over that..  The bone flap was replaced with Stryker cranial  plating system.  The wound is irrigated thoroughly with bacitracin  impregnated irrigation.  The galea was closed with 2-0 Vicryl sutures in buried fashion.  Skin was closed with staples.  Sterile dressing was then placed on the incision.  Patient was then extubated by the anesthesia service moving all extremities.  All counts were correct at the end of surgery.  No complications were noted.   Findings: Frozen section consistent with poorly differentiated carcinoma  Estimated Blood Loss:  less than 50 mL         Drains: none         Total IV Fluids: See anesthesia records  Blood Given: none          Specimens: Brain tumor         Implants: Stryker cranial plating  Complications:  * No complications entered in OR log *         Disposition: PACU - hemodynamically stable.         Condition: stable

## 2023-08-21 NOTE — Anesthesia Preprocedure Evaluation (Addendum)
 Anesthesia Evaluation  Patient identified by MRN, date of birth, ID band Patient awake    Reviewed: Allergy & Precautions, NPO status , Patient's Chart, lab work & pertinent test results  Airway Mallampati: II  TM Distance: >3 FB Neck ROM: Full    Dental  (+) Dental Advisory Given, Partial Upper   Pulmonary former smoker   Pulmonary exam normal        Cardiovascular hypertension, Pt. on medications Normal cardiovascular exam     Neuro/Psych For surgical section of the large cystic mass in left somatosensory cortex for purposes of diagnosis, relief of mass effect, and oncologic control; expectign likely transient worsening of right hemiparesis postop.   negative psych ROS   GI/Hepatic Neg liver ROS, hiatal hernia,GERD  Medicated and Controlled,,  Endo/Other  Hypothyroidism    Renal/GU negative Renal ROS  negative genitourinary   Musculoskeletal negative musculoskeletal ROS (+)    Abdominal   Peds  Hematology negative hematology ROS (+) Hb 12.2, plt 218   Anesthesia Other Findings   Reproductive/Obstetrics negative OB ROS                              Anesthesia Physical Anesthesia Plan  ASA: 3  Anesthesia Plan: General   Post-op Pain Management: Tylenol  PO (pre-op)*   Induction: Intravenous  PONV Risk Score and Plan: 2 and Ondansetron , Dexamethasone  and Treatment may vary due to age or medical condition  Airway Management Planned: Oral ETT  Additional Equipment: Arterial line  Intra-op Plan:   Post-operative Plan: Possible Post-op intubation/ventilation  Informed Consent: I have reviewed the patients History and Physical, chart, labs and discussed the procedure including the risks, benefits and alternatives for the proposed anesthesia with the patient or authorized representative who has indicated his/her understanding and acceptance.     Dental advisory given  Plan  Discussed with: CRNA and Anesthesiologist  Anesthesia Plan Comments:          Anesthesia Quick Evaluation

## 2023-08-21 NOTE — Transfer of Care (Signed)
 Immediate Anesthesia Transfer of Care Note  Patient: Noah King  Procedure(s) Performed: CRANIOTOMY TUMOR EXCISION (Left) COMPUTER-ASSISTED NAVIGATION, FOR CRANIAL PROCEDURE (Left)  Patient Location: PACU  Anesthesia Type:General  Level of Consciousness: awake  Airway & Oxygen Therapy: Patient Spontanous Breathing  Post-op Assessment: Report given to RN and Post -op Vital signs reviewed and stable  Post vital signs: Reviewed and stable  Last Vitals:  Vitals Value Taken Time  BP 130/64 08/21/23 17:17  Temp 98   Pulse 89 08/21/23 17:22  Resp 21 08/21/23 17:21  SpO2 93 % 08/21/23 17:22  Vitals shown include unfiled device data.  Last Pain:  Vitals:   08/21/23 1120  TempSrc:   PainSc: 0-No pain         Complications: No notable events documented.

## 2023-08-21 NOTE — Consult Note (Addendum)
 NAME:  Noah King, MRN:  [id number], DOB:  04/07/1945, LOS: 0 ADMISSION DATE:  08/21/2023, CONSULTATION DATE:  08/21/23 REFERRING MD:  Debby CHIEF COMPLAINT:  Post op crani   History of Present Illness:  Noah King is a 78 y.o. male who has a PMH as below.  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  He was admitted 8/7 for elective surgical resection.  He was taken to the OR and had craniotomy with tumor excision, computer assisted navigation for cranial procedure.  Postoperatively he returned to the ICU where PCCM was asked to assist with medical management.  Pertinent  Medical History:  has Herpes zoster; Polycythemia vera (HCC); Hypothyroidism; Hyperlipidemia; Essential hypertension; Contracture of palmar fascia; Right sided weakness; Mass, brain; Mediastinal lymphadenopathy; and S/P craniotomy on their problem list.  Significant Hospital Events: Including procedures, antibiotic start and stop dates in addition to other pertinent events   8/7 admit  Interim History / Subjective:  No issues post surgery. Goal MAP 70 - 90 per Dr. Debby of NSGY.  Objective:  Blood pressure (!) 117/92, pulse 86, temperature 97.7 F (36.5 C), resp. rate 15, height 5' 10 (1.778 m), weight 81.6 kg, SpO2 95%.        Intake/Output Summary (Last 24 hours) at 08/21/2023 1747 Last data filed at 08/21/2023 1702 Gross per 24 hour  Intake 2760 ml  Output 2050 ml  Net 710 ml   Filed Weights   08/20/23 1340 08/21/23 1107  Weight: 81.6 kg 81.6 kg     Physical Exam: General: Adult male, resting in bed, in NAD. Neuro: A bit sleepy and confused post op, mumbling words, MAE's though weak in BLE R > L. HEENT: Scalp dressing in place. Sclerae anicteric. EOMI. Cardiovascular: RRR, no M/R/G.  Lungs: Respirations even and unlabored.  CTA bilaterally, No W/R/R. Abdomen: BS x 4, soft, NT/ND.  Musculoskeletal: No gross  deformities, no edema.   Assessment & Plan:   L sided cystic mass - s/p craniotomy with tumor excision, computer assisted navigation for cranial procedure. on 08/20/23 with Dr. Debby. - Post op care per NSGY. - Pain control.  Hx HTN, HLD. - Goal MAP 70 - 90 per Dr. Debby of NSGY. - PRN Hydralazine . - Continue PTA Atorvastatin  - Hold PTA Losartan .  Multiple pulmonary nodules - s/p transbronchial aspiration of subcarinal lymph node with no malignant cells noted (benign lymph node). - F/u as an outpatient for surveillance purposes.  Best practice (evaluated daily):  Diet/type: NPO immediately post op, advance as able. DVT prophylaxis: SCD Pressure ulcer(s): pressure ulcer assessment deferred  GI prophylaxis: N/A Lines: Arterial Line Foley:  Yes, and it is still needed Code Status:  full code Last date of multidisciplinary goals of care discussion: None yet.  Labs   CBC: No results for input(s): WBC, NEUTROABS, HGB, HCT, MCV, PLT in the last 168 hours.  Basic Metabolic Panel: No results for input(s): NA, K, CL, CO2, GLUCOSE, BUN, CREATININE, CALCIUM , MG, PHOS in the last 168 hours. GFR: Estimated Creatinine Clearance: 62.9 mL/min (by C-G formula based on SCr of 1 mg/dL). No results for input(s): PROCALCITON, WBC, LATICACIDVEN in the last 168 hours.  Liver Function Tests: No results for input(s): AST, ALT, ALKPHOS, BILITOT, PROT, ALBUMIN  in the last 168 hours. No results for input(s): LIPASE, AMYLASE in the last 168 hours. No results for input(s): AMMONIA in the last 168 hours.  ABG No results found  for: PHART, PCO2ART, PO2ART, HCO3, TCO2, ACIDBASEDEF, O2SAT   Coagulation Profile: No results for input(s): INR, PROTIME in the last 168 hours.  Cardiac Enzymes: No results for input(s): CKTOTAL, CKMB, CKMBINDEX, TROPONINI in the last 168 hours.  HbA1C: Hgb A1c MFr Bld  Date/Time Value Ref  Range Status  08/11/2023 11:55 AM 4.7 (L) 4.8 - 5.6 % Final    Comment:    (NOTE) Diagnosis of Diabetes The following HbA1c ranges recommended by the American Diabetes Association (ADA) may be used as an aid in the diagnosis of diabetes mellitus.  Hemoglobin             Suggested A1C NGSP%              Diagnosis  <5.7                   Non Diabetic  5.7-6.4                Pre-Diabetic  >6.4                   Diabetic  <7.0                   Glycemic control for                       adults with diabetes.      CBG: No results for input(s): GLUCAP in the last 168 hours.  Review of Systems:   Unable to obtain as pt is somnolent post op.  Past Medical History:  He,  has a past medical history of Allergy, Cataract, Dupuytren's contracture, GERD (gastroesophageal reflux disease), Hiatal hernia, Hypertension, and Polycythemia.   Surgical History:   Past Surgical History:  Procedure Laterality Date   cataract extraction left eye     COLONOSCOPY     DUPUYTREN CONTRACTURE RELEASE Left    POLYPECTOMY     rt knee arthroscopic     VIDEO BRONCHOSCOPY WITH ENDOBRONCHIAL ULTRASOUND Right 08/13/2023   Procedure: BRONCHOSCOPY, WITH EBUS;  Surgeon: Jude Harden GAILS, MD;  Location: [facility] ENDOSCOPY;  Service: Cardiopulmonary;  Laterality: Right;     Social History:   reports that he quit smoking about 38 years ago. His smoking use included cigarettes. He has never used smokeless tobacco. He reports current alcohol use of about 20.0 standard drinks of alcohol per week. He reports that he does not use drugs.   Family History:  His family history includes Breast cancer in his sister; Cancer in his sister; Colon cancer in his father; Stroke in his mother. There is no history of Colon polyps or Esophageal cancer.   Allergies Allergies  Allergen Reactions   Lotensin  [Benazepril  Hcl] Cough     Home Medications  Prior to Admission medications   Medication Sig Start Date End Date Taking?  Authorizing Provider  atorvastatin  (LIPITOR) 20 MG tablet Take 1 tablet (20 mg total) by mouth daily. 12/17/22  Yes Nafziger, Darleene, NP  dexamethasone  (DECADRON ) 4 MG tablet Take 1 tablet (4 mg total) by mouth every 12 (twelve) hours. 08/15/23  Yes Krishnan, Gokul, MD  docusate sodium  (COLACE) 100 MG capsule Take 100 mg by mouth daily as needed (constipation).   Yes [provider]  folic acid  (FOLVITE ) 1 MG tablet Take 1 tablet (1 mg total) by mouth daily. 08/15/23  Yes Krishnan, Gokul, MD  losartan  (COZAAR ) 25 MG tablet Take 1 tablet (25 mg total) by mouth daily. 12/17/22  Yes  Nafziger, Darleene, NP  Multiple Vitamins-Minerals (MENS 50+ MULTIVITAMIN) TABS Take 1 tablet by mouth daily.   Yes [provider]  omeprazole  (PRILOSEC) 20 MG capsule TAKE 1 CAPSULE BY MOUTH DAILY 12/10/22  Yes Nafziger, Darleene, NP  polyethylene glycol powder (GLYCOLAX /MIRALAX ) 17 GM/SCOOP powder Take 17 g by mouth daily as needed for mild constipation. 08/15/23  Yes Verdene Purchase, MD  thiamine  (VITAMIN B1) 100 MG tablet Take 1 tablet (100 mg total) by mouth daily. 08/15/23  Yes Verdene Purchase, MD     Sammi Gore, PA - [facility] For pager details, please see AMION or use Epic chat  After 1900, please call [facility] for cross coverage needs 08/21/2023, 5:47 PM

## 2023-08-21 NOTE — Anesthesia Procedure Notes (Signed)
 Procedure Name: Intubation Date/Time: 08/21/2023 1:43 PM  Performed by: Virgil Ee, CRNAPre-anesthesia Checklist: Patient identified, Patient being monitored, Timeout performed, Emergency Drugs available and Suction available Patient Re-evaluated:Patient Re-evaluated prior to induction Oxygen Delivery Method: Circle system utilized Preoxygenation: Pre-oxygenation with 100% oxygen Induction Type: IV induction Ventilation: Mask ventilation without difficulty Laryngoscope Size: Mac and 4 Grade View: Grade II Tube type: Oral Tube size: 7.5 mm Number of attempts: 1 Airway Equipment and Method: Stylet Placement Confirmation: ETT inserted through vocal cords under direct vision, positive ETCO2 and breath sounds checked- equal and bilateral Secured at: 23 cm Tube secured with: Tape Dental Injury: Teeth and Oropharynx as per pre-operative assessment

## 2023-08-22 ENCOUNTER — Inpatient Hospital Stay (HOSPITAL_COMMUNITY)

## 2023-08-22 DIAGNOSIS — Z9889 Other specified postprocedural states: Secondary | ICD-10-CM | POA: Diagnosis not present

## 2023-08-22 DIAGNOSIS — R918 Other nonspecific abnormal finding of lung field: Secondary | ICD-10-CM | POA: Diagnosis not present

## 2023-08-22 LAB — COMPREHENSIVE METABOLIC PANEL WITH GFR
ALT: 39 U/L (ref 0–44)
AST: 20 U/L (ref 15–41)
Albumin: 2.9 g/dL — ABNORMAL LOW (ref 3.5–5.0)
Alkaline Phosphatase: 37 U/L — ABNORMAL LOW (ref 38–126)
Anion gap: 8 (ref 5–15)
BUN: 12 mg/dL (ref 8–23)
CO2: 20 mmol/L — ABNORMAL LOW (ref 22–32)
Calcium: 8.2 mg/dL — ABNORMAL LOW (ref 8.9–10.3)
Chloride: 110 mmol/L (ref 98–111)
Creatinine, Ser: 0.7 mg/dL (ref 0.61–1.24)
GFR, Estimated: >60 mL/min (ref >60)
Glucose, Bld: 128 mg/dL — ABNORMAL HIGH (ref 70–99)
Potassium: 3.8 mmol/L (ref 3.5–5.1)
Sodium: 138 mmol/L (ref 135–145)
Total Bilirubin: 0.5 mg/dL (ref 0.0–1.2)
Total Protein: 5.1 g/dL — ABNORMAL LOW (ref 6.5–8.1)

## 2023-08-22 LAB — CBC
HCT: 35.8 % — ABNORMAL LOW (ref 39.0–52.0)
Hemoglobin: 11.7 g/dL — ABNORMAL LOW (ref 13.0–17.0)
MCH: 27.8 pg (ref 26.0–34.0)
MCHC: 32.7 g/dL (ref 30.0–36.0)
MCV: 85 fL (ref 80.0–100.0)
Platelets: 197 K/uL (ref 150–400)
RBC: 4.21 MIL/uL — ABNORMAL LOW (ref 4.22–5.81)
RDW: 15.5 % (ref 11.5–15.5)
WBC: 19.1 K/uL — ABNORMAL HIGH (ref 4.0–10.5)
nRBC: 0 % (ref 0.0–0.2)

## 2023-08-22 MED ORDER — GADOBUTROL 1 MMOL/ML IV SOLN
8.0000 mL | Freq: Once | INTRAVENOUS | Status: AC | PRN
  Administered 2023-08-22: 8 mL via INTRAVENOUS

## 2023-08-22 MED ORDER — ENOXAPARIN SODIUM 40 MG/0.4ML IJ SOSY
40.0000 mg | PREFILLED_SYRINGE | INTRAMUSCULAR | Status: DC
  Administered 2023-08-23: 40 mg via SUBCUTANEOUS
  Filled 2023-08-22: qty 0.4

## 2023-08-22 MED FILL — Thrombin For Soln 20000 Unit: CUTANEOUS | Qty: 1 | Status: AC

## 2023-08-22 NOTE — Progress Notes (Signed)
 Subjective: Patient reports no headaches, feels right side is stronger compared with prior to surgery  Objective: Vital signs in last 24 hours: Temp:  [97.3 F (36.3 C)-98.2 F (36.8 C)] 98.1 F (36.7 C) (08/08 0818) Pulse Rate:  [42-90] 56 (08/08 1000) Resp:  [6-19] 17 (08/08 1000) BP: (110-173)/(48-92) 118/67 (08/08 1000) SpO2:  [93 %-100 %] 97 % (08/08 1000) Arterial Line BP: (119-202)/(48-76) 138/52 (08/08 1000) Weight:  [81.6 kg] 81.6 kg (08/07 1107)  Intake/Output from previous day: 08/07 0701 - 08/08 0700 In: 4592.8 [P.O.:720; I.V.:2954; IV Piggyback:918.8] Out: 3425 [Urine:3375; Blood:50] Intake/Output this shift: Total I/O In: 240 [P.O.:240] Out: -   NAD A+Ox3 No pronator drift 4+/5 strength in RLE  Lab Results: Recent Labs    08/21/23 1514 08/22/23 0310  WBC  --  19.1*  HGB 11.6* 11.7*  HCT 34.0* 35.8*  PLT  --  197   BMET Recent Labs    08/21/23 1514 08/22/23 0310  NA 137 138  K 3.8 3.8  CL  --  110  CO2  --  20*  GLUCOSE  --  128*  BUN  --  12  CREATININE  --  0.70  CALCIUM   --  8.2*    Studies/Results: MR BRAIN W WO CONTRAST Result Date: 08/22/2023 EXAM: MRI BRAIN WITH AND WITHOUT CONTRAST 08/22/2023 06:35:02 AM TECHNIQUE: Multiplanar multisequence MRI of the head/brain was performed with and without the administration of intravenous contrast. 8mL gadobutrol  (GADAVIST ) 1 MMOL/ML injection 8 mL GADOBUTROL  1 MMOL/ML IV SOLN. COMPARISON: Brain MRI 08/12/23. CLINICAL HISTORY: Craniotomy, post-op. FINDINGS: BRAIN AND VENTRICLES: Left parietal craniotomy noted for resection of tumor. Minimal enhancement remains along the deep margin of the operative bed. The 4 mm focus of enhancement in the posterior left hippocampus is stable. Mass effect in the left parietal lobe is slightly improved. Extensive T2 and FLAIR signal hyperintensity remains. No pneumocephalus is present. Periventricular and scattered subcortical T2 hyperintensities otherwise are mildly  advanced for age. Multiple foci of susceptibility are present involving the cerebellum bilaterally, medial left occipital lobe, posterior left parietal lobe, and anterior left frontal lobe. Blood products are noted within the operative bed. ORBITS: Left lens replacement is present. SINUSES: A fluid level is present in the right maxillary sinus. The right frontal sinus is opacified. BONES AND SOFT TISSUES: No acute soft tissue abnormality. IMPRESSION: 1. Left parietal craniotomy for tumor resection with minimal residual enhancement along the deep margin of the operative bed and stable 4 mm focus of enhancement in the posterior left hippocampus. 2. Slightly improved mass effect in the left parietal lobe with extensive T2/FLAIR hyperintensity. 3. Multiple foci of susceptibility involving the cerebellum bilaterally, medial left occipital lobe, posterior left parietal lobe, and anterior left frontal lobe. Blood products are noted within the operative bed. 4. Mildly advanced periventricular and scattered subcortical T2 hyperintensities for age. 5. Left lens replacement. 6. Fluid level in the right maxillary sinus and opacification of the right frontal sinus. Electronically signed by: Lonni Necessary MD 08/22/2023 06:54 AM EDT RP Workstation: [id number]    Assessment/Plan: S/p resection of perirolandic malignant brain tumor - downgrade to progressive care - dex taper - PT/OT - likely dc tomorrow   Noah King 08/22/2023, 11:01 AM

## 2023-08-22 NOTE — Progress Notes (Signed)
 PT BID Note   Patient progressing and daughter with concerns.  Addressed for DME and aide needs.  PT will follow up .    08/22/23 1520  PT Visit Information  Last PT Received On 08/22/23  Assistance Needed +1  History of Present Illness 78 y.o. male presents to [facility] hospital on 08/21/2023 for scheduled resection of a large left parietal cystic mass in the left somatosensory cortex.  PMH includes HTN.  Precautions  Precautions Fall  Recall of Precautions/Restrictions Intact  Restrictions  Weight Bearing Restrictions Per Provider Order No  Pain Assessment  Pain Assessment No/denies pain  Cognition  Arousal Alert  Behavior During Therapy WFL for tasks assessed/performed  PT - Cognitive impairments No apparent impairments  Following Commands  Following commands Intact  Cueing  Cueing Techniques Verbal cues  Communication  Communication No apparent difficulties  Bed Mobility  Overal bed mobility Needs Assistance  Bed Mobility Sit to Supine  Supine to sit Supervision  Sit to supine Supervision  General bed mobility comments increased time, had cued for supine to sidelying but pt sat straight up with rail and time and effort, to sidelying with S and positioned for comfort with pillow. Practiced with daughter present and at her request as trying to decide about rail for bed.  General Comments  General comments (skin integrity, edema, etc.) daughter also talking about aide to help with shower since she and her husband can help though pt will be alone after about a week's time. Also states equipment is borrowed so needs RW and 3:1 due to bahtroom door narrow and needs it for night time.  PT - End of Session  Activity Tolerance Patient tolerated treatment well  Patient left in bed;with call bell/phone within reach;with nursing/sitter in room   PT - Assessment/Plan  PT Visit Diagnosis Other abnormalities of gait and mobility (R26.89);Muscle weakness (generalized) (M62.81)  PT Frequency (ACUTE  ONLY) Min 2X/week  Follow Up Recommendations Home health PT [facility] aide)  Patient can return home with the following Assist for transportation;Help with stairs or ramp for entrance  PT equipment Rolling walker (2 wheels);BSC/3in1  AM-PAC PT 6 Clicks Mobility Outcome Measure (Version 2)  Help needed turning from your back to your side while in a flat bed without using bedrails? 4  Help needed moving from lying on your back to sitting on the side of a flat bed without using bedrails? 4  Help needed moving to and from a bed to a chair (including a wheelchair)? 3  Help needed standing up from a chair using your arms (e.g., wheelchair or bedside chair)? 3  Help needed to walk in hospital room? 3  Help needed climbing 3-5 steps with a railing?  3  6 Click Score 20  Consider Recommendation of Discharge To: Home with no services  Progressive Mobility  What is the highest level of mobility based on the mobility assessment? Level 4 (Ambulates with assistance) - Balance while stepping forward/back - Complete  Activity Ambulated with assistance  PT Goal Progression  Progress towards PT goals Progressing toward goals  PT Time Calculation  PT Start Time (ACUTE ONLY) 1502  PT Stop Time (ACUTE ONLY) 1520  PT Time Calculation (min) (ACUTE ONLY) 18 min  PT General Charges  $$ ACUTE PT VISIT 1 Visit  PT Treatments  $Therapeutic Activity 8-22 mins   Micheline Portal, PT Acute Rehabilitation Services [phone] 08/22/2023

## 2023-08-22 NOTE — Evaluation (Addendum)
 Physical Therapy Evaluation Patient Details Name: Noah King MRN: [id number] DOB: 1945-10-26 Today's Date: 08/22/2023  History of Present Illness  78 y.o. 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  PMH includes HTN.  Clinical Impression  Patient presents with decreased mobility due to generalized weakness, decreased balance with decreased sensatino and strength in ankles and feet.  Patient with steppage gait and reports has been compensating at home for some time.  Does report falls though onto couch or bed.  He is eager for home and states daughter can stay for a week at d/c.  PT will continue to follow while in the acute setting.  Recommend HHPT at d/c.         If plan is discharge home, recommend the following: Assist for transportation;Help with stairs or ramp for entrance   Can travel by private vehicle        Equipment Recommendations Rolling walker (2 wheels);BSC/3in1  Recommendations for Other Services       Functional Status Assessment Patient has had a recent decline in their functional status and demonstrates the ability to make significant improvements in function in a reasonable and predictable amount of time.     Precautions / Restrictions Precautions Precautions: Fall Recall of Precautions/Restrictions: Intact      Mobility  Bed Mobility Overal bed mobility: Needs Assistance Bed Mobility: Sit to Supine       Sit to supine: Supervision   General bed mobility comments: in chair initially, to supine with S.    Transfers Overall transfer level: Needs assistance Equipment used: Rolling walker (2 wheels) Transfers: Sit to/from Stand Sit to Stand: Supervision, Contact guard assist           General transfer comment: increased time, cues for scooting to edge of chair, heavy UE reliance, A for balance during hand transition to RW     Ambulation/Gait Ambulation/Gait assistance: Supervision, Contact guard assist Gait Distance (Feet): 120 Feet Assistive device: Rolling walker (2 wheels) Gait Pattern/deviations: Step-to pattern, Step-through pattern, Steppage       General Gait Details: initial CGA for safety fading to S; pt slow though stable taking his time and keeping walker close. useing rollator at home so one cue during turning to keep R leg inside walker  Stairs            Wheelchair Mobility     Tilt Bed    Modified Rankin (Stroke Patients Only)       Balance Overall balance assessment: Needs assistance Sitting-balance support: Feet supported Sitting balance-Leahy Scale: Good     Standing balance support: Bilateral upper extremity supported, Reliant on assistive device for balance Standing balance-Leahy Scale: Poor                               Pertinent Vitals/Pain Pain Assessment Pain Assessment: No/denies pain    Home Living Family/patient expects to be discharged to:: Private residence Living Arrangements: Alone Available Help at Discharge: Family;Friend(s) Type of Home: House Home Access: Stairs to enter Entrance Stairs-Rails: None Entrance Stairs-Number of Steps: 2   Home Layout: One level Home Equipment: Rollator (4 wheels);Cane - single point;Shower seat;Wheelchair - manual;BSC/3in1      Prior Function Prior Level of Function : Independent/Modified Independent             Mobility Comments: pt has been primarily ambulating with  a rollator for the last month due to LE weakness, prior was independent.  No longer drives. ADLs Comments: pt reports incr challenge with ADLs (bathing specifically), manages own meds, friend will be assisting with meals and groceries.  Daughter from [place]  will be staying with him for a few days.     Extremity/Trunk Assessment   Upper Extremity Assessment Upper Extremity Assessment: Defer to OT evaluation RUE Deficits /  Details: slightly weaker compared to LUE RUE Sensation: decreased light touch RUE Coordination: decreased fine motor;decreased gross motor    Lower Extremity Assessment Lower Extremity Assessment: RLE deficits/detail;LLE deficits/detail RLE Deficits / Details: AROM WFL, strength hip flexion 4+/5, knee extension 5/5, ankle DF 2-/5 RLE Sensation: decreased light touch LLE Deficits / Details: AROM WFL, strength hip felxion 4+/5, knee extension 5/5, ankle DF 2-/5 LLE Sensation: decreased light touch    Cervical / Trunk Assessment Cervical / Trunk Assessment: Normal  Communication   Communication Communication: No apparent difficulties    Cognition Arousal: Alert Behavior During Therapy: WFL for tasks assessed/performed   PT - Cognitive impairments: No apparent impairments                         Following commands: Intact       Cueing Cueing Techniques: Verbal cues     General Comments General comments (skin integrity, edema, etc.): States couple of LOB at home landing on couch or bed, discussed rehab though pt eager to go home and states daughter can stay a week    Exercises     Assessment/Plan    PT Assessment Patient needs continued PT services  PT Problem List Decreased strength;Decreased balance;Decreased mobility;Impaired sensation;Decreased knowledge of use of DME       PT Treatment Interventions DME instruction;Gait training;Stair training;Functional mobility training;Therapeutic activities;Therapeutic exercise;Balance training;Neuromuscular re-education;Cognitive remediation;Patient/family education    PT Goals (Current goals can be found in the Care Plan section)  Acute Rehab PT Goals Patient Stated Goal: to return to independence PT Goal Formulation: With patient Time For Goal Achievement: 09/05/23 Potential to Achieve Goals: Good    Frequency       Co-evaluation               AM-PAC PT 6 Clicks Mobility  Outcome Measure Help  needed turning from your back to your side while in a flat bed without using bedrails?: None Help needed moving from lying on your back to sitting on the side of a flat bed without using bedrails?: None Help needed moving to and from a bed to a chair (including a wheelchair)?: A Little Help needed standing up from a chair using your arms (e.g., wheelchair or bedside chair)?: A Little Help needed to walk in hospital room?: A Little Help needed climbing 3-5 steps with a railing? : A Little 6 Click Score: 20    End of Session Equipment Utilized During Treatment: Gait belt Activity Tolerance: Patient tolerated treatment well Patient left: in bed;with call bell/phone within reach;with nursing/sitter in room   PT Visit Diagnosis: Other abnormalities of gait and mobility (R26.89);Muscle weakness (generalized) (M62.81)    Time: 8767-8744 PT Time Calculation (min) (ACUTE ONLY): 23 min   Charges:   PT Evaluation $PT Eval Moderate Complexity: 1 Mod PT Treatments $Gait Training: 8-22 mins PT General Charges $$ ACUTE PT VISIT: 1 Visit         Micheline Portal, PT Acute Rehabilitation Services Office:[phone] 08/22/2023   Montie Portal 08/22/2023, 3:18 PM

## 2023-08-22 NOTE — Progress Notes (Signed)
 ELink, RN Maya Ruhl) notified that patient becomes bradycardic in the 40s while resting and there are no current lab orders entered. Harlene, RN to notify Ema, MD.

## 2023-08-22 NOTE — Evaluation (Signed)
 Occupational Therapy Evaluation Patient Details Name: Noah King MRN: [id number] DOB: [birth year] Today's Date: 08/22/2023   History of Present Illness   78 y.o. 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  PMH includes HTN.     Clinical Impressions Patient admitted for the diagnosis above.  PTA he was living at home, awaiting surgery, and admits to increasing difficulty with ADL, iADL, community mobility and in home mobility, needing a RW.  Patient stating he feels stronger than before the surgery, despite deficits listed below, and wants to return home with assist as needed from his daughter and a close friend.  PT Consult is pending, but he would benefit from post acute rehab.  OT will continue efforts in the acute setting to address deficits.       If plan is discharge home, recommend the following:   Assistance with cooking/housework;Assist for transportation;A little help with walking and/or transfers;A little help with bathing/dressing/bathroom     Functional Status Assessment   Patient has had a recent decline in their functional status and demonstrates the ability to make significant improvements in function in a reasonable and predictable amount of time.     Equipment Recommendations   None recommended by OT     Recommendations for Other Services         Precautions/Restrictions   Precautions Precautions: Fall Recall of Precautions/Restrictions: Intact Restrictions Weight Bearing Restrictions Per Provider Order: No     Mobility Bed Mobility Overal bed mobility: Needs Assistance Bed Mobility: Supine to Sit     Supine to sit: Min assist       Patient Response: Cooperative  Transfers Overall transfer level: Needs assistance Equipment used: Rolling walker (2 wheels) Transfers: Sit to/from Stand, Bed to chair/wheelchair/BSC Sit to Stand: Min assist      Step pivot transfers: Min assist     General transfer comment: decreased balance and ability to manipulate RW      Balance Overall balance assessment: Needs assistance Sitting-balance support: No upper extremity supported, Feet supported Sitting balance-Leahy Scale: Good     Standing balance support: Reliant on assistive device for balance Standing balance-Leahy Scale: Poor                             ADL either performed or assessed with clinical judgement   ADL       Grooming: Contact guard assist;Standing;Wash/dry face;Oral care   Upper Body Bathing: Sitting;Supervision/ safety;Set up   Lower Body Bathing: Minimal assistance;Sit to/from stand   Upper Body Dressing : Supervision/safety;Set up;Sitting   Lower Body Dressing: Minimal assistance;Sit to/from stand   Toilet Transfer: Minimal assistance;Stand-pivot;Regular Toilet;Rolling walker (2 wheels)                   Vision Baseline Vision/History: 1 Wears glasses Patient Visual Report: No change from baseline       Perception Perception: Not tested       Praxis Praxis: Not tested       Pertinent Vitals/Pain Pain Assessment Pain Assessment: No/denies pain     Extremity/Trunk Assessment Upper Extremity Assessment Upper Extremity Assessment: Right hand dominant;RUE deficits/detail RUE Deficits / Details: slightly weaker compared to LUE RUE Sensation: decreased light touch RUE Coordination: decreased fine motor;decreased gross motor   Lower Extremity Assessment Lower Extremity Assessment: Defer to PT evaluation RLE Deficits / Details: grossly 4+/5 aside from  DF strength which appears to be 2-/5   Cervical / Trunk Assessment Cervical / Trunk Assessment: Normal   Communication Communication Communication: No apparent difficulties   Cognition Arousal: Alert Behavior During Therapy: WFL for tasks assessed/performed Cognition: No apparent impairments             OT -  Cognition Comments: Delayed responses at times.                 Following commands: Intact       Cueing  General Comments   Cueing Techniques: Verbal cues   VSS  on RA   Exercises     Shoulder Instructions      Home Living Family/patient expects to be discharged to:: Private residence Living Arrangements: Alone Available Help at Discharge: Family;Friend(s) Type of Home: House Home Access: Stairs to enter [employer] of Steps: 2 Entrance Stairs-Rails: None Home Layout: One level     Bathroom Shower/Tub: Walk-in shower;Sponge bathes at baseline   [employer]: Standard Bathroom Accessibility: No   Home Equipment: Rollator (4 wheels);Cane - single point;Shower seat;Wheelchair - manual;BSC/3in1      Lives With: Alone    Prior Functioning/Environment               Mobility Comments: pt has been primarily ambulating with a rollator for the last month due to LE weakness, prior was independent.  No longer drives. ADLs Comments: pt reports incr challenge with ADLs (bathing specifically), manages own meds, friend will be assisting with meals and groceries.  Daughter from [place]  will be staying with him for a few days.    OT Problem List: Decreased strength;Decreased range of motion;Decreased activity tolerance;Impaired balance (sitting and/or standing);Decreased coordination;Decreased safety awareness   OT Treatment/Interventions: Self-care/ADL training;Therapeutic exercise;Energy conservation;DME and/or AE instruction;Therapeutic activities;Balance training;Patient/family education      OT Goals(Current goals can be found in the care plan section)   Acute Rehab OT Goals Patient Stated Goal: Return home OT Goal Formulation: With patient Time For Goal Achievement: 09/05/23 Potential to Achieve Goals: Good ADL Goals Pt Will Perform Grooming: with modified independence;standing;sitting Pt Will Perform Lower Body Dressing: with modified  independence;sit to/from stand Pt Will Transfer to Toilet: with modified independence;regular height toilet;ambulating   OT Frequency:  Min 2X/week    Co-evaluation              AM-PAC OT 6 Clicks Daily Activity     Outcome Measure Help from another person eating meals?: None Help from another person taking care of personal grooming?: A Little Help from another person toileting, which includes using toliet, bedpan, or urinal?: A Little Help from another person bathing (including washing, rinsing, drying)?: A Little Help from another person to put on and taking off regular upper body clothing?: A Little Help from another person to put on and taking off regular lower body clothing?: A Little 6 Click Score: 19   End of Session Equipment Utilized During Treatment: Gait belt;Rolling walker (2 wheels) Nurse Communication: Mobility status  Activity Tolerance: Patient tolerated treatment well Patient left: in chair;with call bell/phone within reach;with chair alarm set  OT Visit Diagnosis: Unsteadiness on feet (R26.81);Other abnormalities of gait and mobility (R26.89);Muscle weakness (generalized) (M62.81)                Time: 8878-8854 OT Time Calculation (min): 24 min Charges:  OT General Charges $OT Visit: 1 Visit OT Evaluation $OT Eval Moderate Complexity: 1 Mod OT Treatments $Self Care/Home Management : 8-22 mins  08/22/2023  RP, OTR/L  Acute Rehabilitation Services  Office:  [phone]   Charlie JONETTA Halsted 08/22/2023, 11:56 AM

## 2023-08-22 NOTE — Progress Notes (Signed)
 SLP Cancellation Note  Patient Details Name: Noah King MRN: [id number] DOB: [birth year]   Cancelled treatment:       Reason Eval/Treat Not Completed: Other (comment) Order received for swallow eval. Pt has passed a swallow screen and per RN, is doing well with swallowing regular diet. Will defer swallow eval for now and f/u for SLP cognitive-linguistic eval as able.     Leita SAILOR., M.A. CCC-SLP Acute Rehabilitation Services Office: [phone]  Secure chat preferred  08/22/2023, 10:02 AM

## 2023-08-22 NOTE — Progress Notes (Signed)
   08/22/23 1300  Vitals  Temp 98.3 F (36.8 C)  Temp Source Oral  BP 126/64  MAP (mmHg) 82  BP Location Right Arm  BP Method Automatic  Patient Position (if appropriate) Lying  Pulse Rate 66  Pulse Rate Source Monitor  ECG Heart Rate 63  Resp 15  Level of Consciousness  Level of Consciousness Alert  MEWS COLOR  MEWS Score Color Green  Oxygen Therapy  SpO2 97 %  O2 Device Room Air  Pain Assessment  Pain Scale 0-10  Pain Score 0  PCA/Epidural/Spinal Assessment  Respiratory Pattern Regular;Unlabored;Symmetrical  ECG Monitoring  PR interval 0.18  QRS interval 11  QT interval 0.45  QTc interval 0.49  CV Strip Heart Rate 65  Cardiac Rhythm NSR  Glasgow Coma Scale  Eye Opening 4  Best Verbal Response (NON-intubated) 5  Best Motor Response 6  Glasgow Coma Scale Score 15  MEWS Score  MEWS Temp 0  MEWS Systolic 0  MEWS Pulse 0  MEWS RR 0  MEWS LOC 0  MEWS Score 0   Pt transferred to unit from 4N ICU with all personal belongings. Pt A&Ox4, MAEx4, alert and verbally responsive. Telemetry applied and CCMD called. Hydrocolloid drsg to head clean, dry, intact. Pt oriented to unit, room, and call light system. Pt in bed with alarm on and in lowest position, and call bell within reach. Pt's daughter at bedside.

## 2023-08-22 NOTE — Consult Note (Signed)
 NAME:  Noah King, MRN:  [id number], DOB:  1945-10-12, LOS: 1 ADMISSION DATE:  08/21/2023, CONSULTATION DATE:  08/21/23 REFERRING MD:  Debby CHIEF COMPLAINT:  Post op crani   History of Present Illness:  Noah King is a 78 y.o. male who has a PMH as below.  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  He was admitted 8/7 for elective surgical resection.  He was taken to the OR and had craniotomy with tumor excision, computer assisted navigation for cranial procedure.  Postoperatively he returned to the ICU where PCCM was asked to assist with medical management.  Pertinent  Medical History:  has Herpes zoster; Polycythemia vera (HCC); Hypothyroidism; Hyperlipidemia; Essential hypertension; Contracture of palmar fascia; Right sided weakness; Mass, brain; Mediastinal lymphadenopathy; S/P craniotomy; and Pulmonary nodules on their problem list.  Significant Hospital Events: Including procedures, antibiotic start and stop dates in addition to other pertinent events     Interim History / Subjective:  MAP's 78 - 90   Objective:  Blood pressure (!) 128/56, pulse (!) 47, temperature 98.2 F (36.8 C), temperature source Oral, resp. rate 11, height 5' 10 (1.778 m), weight 81.6 kg, SpO2 98%.        Intake/Output Summary (Last 24 hours) at 08/22/2023 0732 Last data filed at 08/22/2023 0700 Gross per 24 hour  Intake 4592.75 ml  Output 3425 ml  Net 1167.75 ml   Filed Weights   08/20/23 1340 08/21/23 1107  Weight: 81.6 kg 81.6 kg     Physical Exam: General: Awake alert sitting up in bed and eating breakfast Neuro: Awake, nonfocal, answers questions appropriately, moves all extremities HEENT: Scalp dressing in place, oropharynx clear Cardiovascular: Regular, distant, no murmur.  A-line in place Lungs: Clear bilaterally Abdomen: Nondistended with positive bowel sounds Musculoskeletal: No  edema  Assessment & Plan:   L sided cystic mass - s/p craniotomy with tumor excision, computer assisted navigation for cranial procedure. on 08/20/23 with Dr. Debby. -Pain control - Postop care as per neurosurgery plans  Hx HTN, HLD. - Hydralazine  available, MAP goal 70-90 - Continue atorvastatin  - Losartan  restarted - Should be able get his A-line out today 8/8  Pulmonary nodules with prominent mediastinal and hilar nodes - CT chest from 08/11/2023 reviewed, shows multiple calcified and noncalcified nodules bilaterally, largest 7 mm. - EBUS station 7 nodes 08/13/2023 negative for malignancy, right lower lobe BAL negative for malignancy.  Question granulomatous disease versus cancer. - Recommend repeat CT chest in 6 months and pulmonary follow-up   Best practice (evaluated daily):  Diet/type: Regular consistency (see orders)  DVT prophylaxis: SCD Pressure ulcer(s): pressure ulcer assessment deferred  GI prophylaxis: N/A Lines: Arterial Line-plan to remove Foley:  Yes, and it is still needed Code Status:  full code Last date of multidisciplinary goals of care discussion: None yet.  Labs   CBC: Recent Labs  Lab 08/21/23 1514 08/22/23 0310  WBC  --  19.1*  HGB 11.6* 11.7*  HCT 34.0* 35.8*  MCV  --  85.0  PLT  --  197    Basic Metabolic Panel: Recent Labs  Lab 08/21/23 1514 08/22/23 0310  NA 137 138  K 3.8 3.8  CL  --  110  CO2  --  20*  GLUCOSE  --  128*  BUN  --  12  CREATININE  --  0.70  CALCIUM   --  8.2*   GFR: Estimated Creatinine Clearance: 78.6  mL/min (by C-G formula based on SCr of 0.7 mg/dL). Recent Labs  Lab 08/22/23 0310  WBC 19.1*    Liver Function Tests: Recent Labs  Lab 08/22/23 0310  AST 20  ALT 39  ALKPHOS 37*  BILITOT 0.5  PROT 5.1*  ALBUMIN  2.9*   No results for input(s): LIPASE, AMYLASE in the last 168 hours. No results for input(s): AMMONIA in the last 168 hours.  ABG    Component Value Date/Time   PHART 7.378  08/21/2023 1514   PCO2ART 35.3 08/21/2023 1514   PO2ART 288 (H) 08/21/2023 1514   HCO3 20.8 08/21/2023 1514   TCO2 22 08/21/2023 1514   ACIDBASEDEF 4.0 (H) 08/21/2023 1514   O2SAT 100 08/21/2023 1514     Coagulation Profile: No results for input(s): INR, PROTIME in the last 168 hours.  Cardiac Enzymes: No results for input(s): CKTOTAL, CKMB, CKMBINDEX, TROPONINI in the last 168 hours.  HbA1C: Hgb A1c MFr Bld  Date/Time Value Ref Range Status  08/11/2023 11:55 AM 4.7 (L) 4.8 - 5.6 % Final    Comment:    (NOTE) Diagnosis of Diabetes The following HbA1c ranges recommended by the American Diabetes Association (ADA) may be used as an aid in the diagnosis of diabetes mellitus.  Hemoglobin             Suggested A1C NGSP%              Diagnosis  <5.7                   Non Diabetic  5.7-6.4                Pre-Diabetic  >6.4                   Diabetic  <7.0                   Glycemic control for                       adults with diabetes.      CBG: No results for input(s): GLUCAP in the last 168 hours.   Lamar Chris, MD, PhD 08/22/2023, 7:32 AM [facility] [phone] or if no answer before 7:00PM call [phone] For any issues after 7:00PM please call eLink [phone]

## 2023-08-22 NOTE — Evaluation (Signed)
 Speech Language Pathology Evaluation Patient Details Name: Noah King MRN: [id number] DOB: March 25, 1945 Today's Date: 08/22/2023 Time: 8476-8465 SLP Time Calculation (min) (ACUTE ONLY): 11 min  Problem List:  Patient Active Problem List   Diagnosis Date Noted   S/P craniotomy 08/21/2023   Pulmonary nodules 08/21/2023   Mediastinal lymphadenopathy 08/13/2023   Mass, brain 08/12/2023   Right sided weakness 08/11/2023   Herpes zoster 12/13/2009   Polycythemia vera (HCC) 04/16/2007   Contracture of palmar fascia 04/16/2007   Hypothyroidism 08/29/2006   Hyperlipidemia 08/29/2006   Essential hypertension 08/29/2006   Past Medical History:  Past Medical History:  Diagnosis Date   Allergy    Cataract    left  extraction- right eye forming   Dupuytren's contracture    GERD (gastroesophageal reflux disease)    Hiatal hernia    past hx-  suspect at one time   Hypertension    Polycythemia    Past Surgical History:  Past Surgical History:  Procedure Laterality Date   cataract extraction left eye     COLONOSCOPY     DUPUYTREN CONTRACTURE RELEASE Left    POLYPECTOMY     rt knee arthroscopic     VIDEO BRONCHOSCOPY WITH ENDOBRONCHIAL ULTRASOUND Right 08/13/2023   Procedure: BRONCHOSCOPY, WITH EBUS;  Surgeon: Jude Harden GAILS, MD;  Location: [facility];  Service: Cardiopulmonary;  Laterality: Right;   HPI:  78 y.o. male presents to [facility] hospital on 08/11/2023 with R hemiparesis and numbness. 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 Seen by SLP 7/29 and scored 19/30 on the SLUMS. PMH includes HTN   Assessment / Plan / Recommendation Clinical Impression  Pt states he notes difficulty with processing and sequencing since craniotomy. His performance today appears somewhat similar, if not slightly improved, compared to recent SLP evaluation. He performed [facility] on subsections of the Cognistat related to orientation, attention, auditory comprehension,  expressive language, calculations, and reasoning. He demonstrated increased difficulty with memory. He was only able to recall 1/4 novel words after a delay. This increased to 3/4 when given multiple choices but overall appears consistent with performance prior to this admission. Discussed f/u with [facility] SLP, with which pt is agreeable. Otherwise, no further f/u is recommended acutely. SLP to sign off.    SLP Assessment  SLP Recommendation/Assessment: All further Speech Language Pathology needs can be addressed in the next venue of care SLP Visit Diagnosis: Cognitive communication deficit (R41.841)     Assistance Recommended at Discharge  PRN  Functional Status Assessment Patient has had a recent decline in their functional status and demonstrates the ability to make significant improvements in function in a reasonable and predictable amount of time.  Frequency and Duration           SLP Evaluation Cognition  Overall Cognitive Status: Impaired/Different from baseline Arousal/Alertness: Awake/alert Orientation Level: Oriented X4 Attention: Sustained Sustained Attention: Appears intact Memory: Impaired Memory Impairment: Retrieval deficit Awareness: Appears intact Problem Solving: Appears intact Executive Function: Reasoning Reasoning: Appears intact       Comprehension  Auditory Comprehension Overall Auditory Comprehension: Appears within functional limits for tasks assessed    Expression Expression Primary Mode of Expression: Verbal Verbal Expression Overall Verbal Expression: Appears within functional limits for tasks assessed   Oral / Motor  Oral Motor/Sensory Function Overall Oral Motor/Sensory Function: Within functional limits Motor Speech Overall Motor Speech: Appears within functional limits for tasks assessed  Damien Blumenthal, M.A., CCC-SLP Speech Language Pathology, Acute Rehabilitation Services  Secure Chat preferred [phone]  08/22/2023, 4:04 PM

## 2023-08-23 MED ORDER — PANTOPRAZOLE SODIUM 40 MG PO TBEC: 40.0000 mg | DELAYED_RELEASE_TABLET | Freq: Every day | ORAL | 0 refills | Status: DC

## 2023-08-23 MED ORDER — HYDROCODONE-ACETAMINOPHEN 5-325 MG PO TABS: 1.0000 | ORAL_TABLET | ORAL | 0 refills | Status: DC | PRN

## 2023-08-23 MED ORDER — LEVETIRACETAM 500 MG PO TABS: 500.0000 mg | ORAL_TABLET | Freq: Two times a day (BID) | ORAL | 0 refills | Status: DC

## 2023-08-23 MED ORDER — LEVETIRACETAM 500 MG PO TABS: 500.0000 mg | ORAL_TABLET | Freq: Two times a day (BID) | ORAL | Status: DC

## 2023-08-23 MED ORDER — DEXAMETHASONE 2 MG PO TABS: ORAL_TABLET | ORAL | 0 refills | Status: AC

## 2023-08-23 NOTE — Plan of Care (Signed)
  Problem: Education: Goal: Knowledge of General Education information will improve Description: Including pain rating scale, medication(s)/side effects and non-pharmacologic comfort measures Outcome: Completed/Met   Problem: Health Behavior/Discharge Planning: Goal: Ability to manage health-related needs will improve Outcome: Completed/Met   Problem: Clinical Measurements: Goal: Ability to maintain clinical measurements within normal limits will improve Outcome: Completed/Met Goal: Will remain free from infection Outcome: Completed/Met Goal: Diagnostic test results will improve Outcome: Completed/Met Goal: Respiratory complications will improve Outcome: Completed/Met Goal: Cardiovascular complication will be avoided Outcome: Completed/Met   Problem: Activity: Goal: Risk for activity intolerance will decrease Outcome: Completed/Met   Problem: Nutrition: Goal: Adequate nutrition will be maintained Outcome: Completed/Met   Problem: Coping: Goal: Level of anxiety will decrease Outcome: Completed/Met   Problem: Elimination: Goal: Will not experience complications related to bowel motility Outcome: Completed/Met Goal: Will not experience complications related to urinary retention Outcome: Completed/Met   Problem: Pain Managment: Goal: General experience of comfort will improve and/or be controlled Outcome: Completed/Met   Problem: Safety: Goal: Ability to remain free from injury will improve Outcome: Completed/Met   Problem: Skin Integrity: Goal: Risk for impaired skin integrity will decrease Outcome: Completed/Met   Problem: Education: Goal: Knowledge of the prescribed therapeutic regimen will improve Outcome: Completed/Met   Problem: Clinical Measurements: Goal: Usual level of consciousness will be regained or maintained. Outcome: Completed/Met Goal: Neurologic status will improve Outcome: Completed/Met Goal: Ability to maintain intracranial pressure will  improve Outcome: Completed/Met   Problem: Skin Integrity: Goal: Demonstration of wound healing without infection will improve Outcome: Completed/Met

## 2023-08-23 NOTE — Progress Notes (Signed)
 Physical Therapy Treatment Patient Details Name: Noah King MRN: [id number] DOB: 02-02-1945 Today's Date: 08/23/2023   History of Present Illness 78 y.o. 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  PMH includes HTN.    PT Comments  The pt required minA for functional transfers and up to [facility] for balance when ambulating with a RW this date. He displayed a tendency to lean posteriorly with transfers, gait, and stair training today, impacting his independence and safety. Educated pt on correcting his posterior lean with all functional mobility to reduce his risk for falls. He required minA to ascend stairs but modA to descend stairs due to the extent of his posterior lean when descending. Educated pt on his risk for falls and to have someone guard/assist him with all standing mobility at this time. He verbalized he had the needed support at d/c or can arrange it later if needed. He also reported his daughter already has a gait belt available to her at home. Will continue to follow acutely.   If plan is discharge home, recommend the following: Assist for transportation;Help with stairs or ramp for entrance;A little help with walking and/or transfers;A little help with bathing/dressing/bathroom;Assistance with cooking/housework   Can travel by Training and development officer (2 wheels);BSC/3in1    Recommendations for Other Services       Precautions / Restrictions Precautions Precautions: Fall Recall of Precautions/Restrictions: Intact Restrictions Weight Bearing Restrictions Per Provider Order: No     Mobility  Bed Mobility Overal bed mobility: Needs Assistance Bed Mobility: Sit to Supine, Supine to Sit     Supine to sit: Supervision, HOB elevated, Used rails Sit to supine: Supervision, HOB elevated, Used rails   General bed mobility comments:  Increased time, pt able to perform bed mobility aspects with supervision for safety    Transfers Overall transfer level: Needs assistance Equipment used: Rolling walker (2 wheels) Transfers: Sit to/from Stand, Bed to chair/wheelchair/BSC Sit to Stand: Min assist   Step pivot transfers: Min assist       General transfer comment: Pt displayed a tendency to lean posteriorly with his feet sliding anteriorly (R>L) this date. Verbal cues along with demonstration provided to scoot to edge of seat, place feet under body, rock trunk to gain momentum while bringing nose over toes, and push up from sitting surface to stand. MinA needed to shift weight anteriorly, balance, and power up to stand from EOB 2x and chair 2x. MinA needed for balance to step pivot to bed from chair with RW support, needing cues to keep turning RW and backing up until he reached the bed, he had x1 posterior LOB when stepping backwards as he seemingly tried to lean back and sit prematurely, recovered with minA.    Ambulation/Gait Ambulation/Gait assistance: Contact guard assist, Min assist Gait Distance (Feet): 45 Feet (x2 bouts of ~45 ft > ~2 ft) Assistive device: Rolling walker (2 wheels) Gait Pattern/deviations: Step-to pattern, Step-through pattern, Steppage, Decreased dorsiflexion - right, Trunk flexed, Wide base of support, Leaning posteriorly Gait velocity: reduced Gait velocity interpretation: <1.31 ft/sec, indicative of household ambulator   General Gait Details: Pt ambulates slowly with decreased R anke dorsiflexion AROM, compensating by hiking his R hip up when stepping. Pt self monitors to keep feet inside RW and proximal to RW throughout. CGA-minA for balance due to noted intermittent posterior lean  Stairs Stairs: Yes Stairs assistance: Min assist, Mod assist Stair Management: One rail Right, One rail Left, Step to pattern, Forwards Number of Stairs: 2 General stair comments: Ascends with L rail and R HHA  and descends with R rail and L HHA. Educated pt to lead up with good leg, which was his L, to improve safety and ease. Pt ascends with minA for balance. When descending, pt leans and pushes himself posteriorly, needing max cues and modA to prevent LOB and maintain his balance, cuing pt to shift his weight anteriorly with min success.   Wheelchair Mobility     Tilt Bed    Modified Rankin (Stroke Patients Only)       Balance Overall balance assessment: Needs assistance Sitting-balance support: No upper extremity supported, Feet supported Sitting balance-Leahy Scale: Good Sitting balance - Comments: sitting EOB Postural control: Posterior lean Standing balance support: Bilateral upper extremity supported, Reliant on assistive device for balance Standing balance-Leahy Scale: Poor Standing balance comment: Posterior lean noted, up to minA for standing/gait balance and modA to descend stairs                            Communication Communication Communication: No apparent difficulties  Cognition Arousal: Alert Behavior During Therapy: WFL for tasks assessed/performed   PT - Cognitive impairments: No apparent impairments                       PT - Cognition Comments: decreased awareness of his posterior lean, but question whether this is baseline as pt did endorse he tends to fall posteriorly at baseline Following commands: Intact      Cueing Cueing Techniques: Verbal cues  Exercises      General Comments General comments (skin integrity, edema, etc.): educated pt on his risk for falls and his tendency to lean posteriorly, educating him to correct his posterior lean and have someone guard/assist him with all standing mobility at this time. He verbalized he had the needed support at d/c or can arrange it later if needed. He also reported his daughter already has a gait belt available to her at home.      Pertinent Vitals/Pain Pain Assessment Pain  Assessment: Faces Faces Pain Scale: No hurt Pain Intervention(s): Monitored during session    Home Living                          Prior Function            PT Goals (current goals can now be found in the care plan section) Acute Rehab PT Goals Patient Stated Goal: to go home PT Goal Formulation: With patient Time For Goal Achievement: 09/05/23 Potential to Achieve Goals: Good Progress towards PT goals: Progressing toward goals    Frequency    Min 2X/week      PT Plan      Co-evaluation              AM-PAC PT 6 Clicks Mobility   Outcome Measure  Help needed turning from your back to your side while in a flat bed without using bedrails?: None Help needed moving from lying on your back to sitting on the side of a flat bed without using bedrails?: A Little Help needed moving to and from a bed to a chair (including a wheelchair)?: A Little Help needed standing up from a chair using your arms (e.g., wheelchair  or bedside chair)?: A Little Help needed to walk in hospital room?: A Little Help needed climbing 3-5 steps with a railing? : A Lot 6 Click Score: 18    End of Session Equipment Utilized During Treatment: Gait belt Activity Tolerance: Patient tolerated treatment well Patient left: in bed;with call bell/phone within reach;with bed alarm set   PT Visit Diagnosis: Other abnormalities of gait and mobility (R26.89);Muscle weakness (generalized) (M62.81);Unsteadiness on feet (R26.81);Difficulty in walking, not elsewhere classified (R26.2)     Time: 8772-8747 PT Time Calculation (min) (ACUTE ONLY): 25 min  Charges:    $Gait Training: 8-22 mins $Therapeutic Activity: 8-22 mins PT General Charges $$ ACUTE PT VISIT: 1 Visit                     Theo Ferretti, PT, DPT Acute Rehabilitation Services  Office: [phone]    Theo CHRISTELLA Ferretti 08/23/2023, 3:47 PM

## 2023-08-23 NOTE — Discharge Summary (Signed)
 Physician Discharge Summary  Patient ID: Noah King MRN: [id number] DOB/AGE: 1945-04-13 78 y.o.  Admit date: 08/21/2023 Discharge date: 08/23/2023  Admission Diagnoses:  Brain tumor  Discharge Diagnoses:  Same Principal Problem:   S/P craniotomy Active Problems:   Pulmonary nodules   Discharged Condition: Stable  Hospital Course:  Noah King is a 78 y.o. 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   Postoperatively his preoperative right-sided weakness was improved and his left leg proprioceptive difficulties were stable. he was admitted to the ICU for monitoring.  Postoperative MRI showed good resection and no complicating postoperative changes.  Physical therapy recommended home health physical therapy and he was downgraded to progressive care. Postoperatively, the patient was mobilized with the help PT, pain was well-controlled with oral meds. Patient reported improved ambulation ability postop. Patient was deemed ready for discharge.   Treatments: Surgery -craniotomy for resection of brain tumor with motor mapping  Discharge Exam: Blood pressure (!) 129/48, pulse (!) 55, temperature 98.6 F (37 C), temperature source Oral, resp. rate 16, height 5' 10 (1.778 m), weight 81.6 kg, SpO2 95%. Awake, alert, oriented Speech fluent, appropriate CN grossly intact 5/5 BUE, 4+/5 left lower extremity wound c/d/i  Disposition: Discharge disposition: 06-Home-Health Care Svc        Allergies as of 08/23/2023       Reactions   Lotensin  [benazepril  Hcl] Cough        Medication List     TAKE these medications    atorvastatin  20 MG tablet Commonly known as: LIPITOR Take 1 tablet (20 mg total) by mouth daily.   dexamethasone  4 MG tablet Commonly known as: DECADRON  Take 1 tablet (4 mg total) by mouth every 12 (twelve) hours. What changed: Another medication with the same name was  added. Make sure you understand how and when to take each.   dexamethasone  2 MG tablet Commonly known as: DECADRON  Take 2 tablets (4 mg total) by mouth every 8 (eight) hours for 2 days, THEN 2 tablets (4 mg total) every 12 (twelve) hours for 2 days, THEN 1 tablet (2 mg total) every 12 (twelve) hours for 2 days, THEN 1 tablet (2 mg total) daily for 2 days, THEN 0.5 tablets (1 mg total) daily for 2 days. Start taking on: August 23, 2023 What changed: You were already taking a medication with the same name, and this prescription was added. Make sure you understand how and when to take each.   docusate sodium  100 MG capsule Commonly known as: COLACE Take 100 mg by mouth daily as needed (constipation).   folic acid  1 MG tablet Commonly known as: FOLVITE  Take 1 tablet (1 mg total) by mouth daily.   HYDROcodone -acetaminophen  5-325 MG tablet Commonly known as: NORCO/VICODIN Take 1 tablet by mouth every 4 (four) hours as needed for moderate pain (pain score 4-6).   levETIRAcetam  500 MG tablet Commonly known as: KEPPRA  Take 1 tablet (500 mg total) by mouth 2 (two) times daily.   losartan  25 MG tablet Commonly known as: COZAAR  Take 1 tablet (25 mg total) by mouth daily.   Mens 50+ Multivitamin Tabs Take 1 tablet by mouth daily.   omeprazole  20 MG capsule Commonly known as: PRILOSEC TAKE 1 CAPSULE BY MOUTH DAILY   pantoprazole  40 MG tablet Commonly known as: PROTONIX  Take 1 tablet (40 mg total) by mouth daily. Start taking on: August 24, 2023   polyethylene glycol powder  17 GM/SCOOP powder Commonly known as: GLYCOLAX /MIRALAX  Take 17 g by mouth daily as needed for mild constipation.   thiamine  100 MG tablet Commonly known as: VITAMIN B1 Take 1 tablet (100 mg total) by mouth daily.        Follow-up Information     Debby Dorn MATSU, MD Follow up in 2 week(s).   Specialty: Neurosurgery Contact information: [address] [place] [address] [phone]                  Signed: Dorn MATSU Debby 08/23/2023, 11:36 AM

## 2023-08-23 NOTE — TOC Transition Note (Addendum)
 Transition of Care [facility]) - Discharge Note   Patient Details  Name: Noah King MRN: [id number] Date of Birth: [birth year]  [facility]) CM/SW Contact:  Robynn Eileen Hoose, RN Phone Number: 08/23/2023, 11:46 AM   Clinical Narrative:   Patient is being discharged today. Spoke with patient regarding HH and DME recommendations. Patient request RNCM speak with daugter regarding HH and DME. Phone call to Daughter at [phone], voicemail left.  1240: Pt noted to be active with [facility]. Katina with [facility] made aware.    Final next level of care: Home w Home Health Services Barriers to Discharge: No Barriers Identified   Patient Goals and CMS Choice            Discharge Placement                       Discharge Plan and Services Additional resources added to the After Visit Summary for                                       Social Drivers of Health (SDOH) Interventions SDOH Screenings   Food Insecurity: No Food Insecurity (08/21/2023)  Housing: Low Risk  (08/21/2023)  Transportation Needs: No Transportation Needs (08/21/2023)  Utilities: Not At Risk (08/21/2023)  Alcohol Screen: Low Risk  (11/01/2021)  Depression (PHQ2-9): Low Risk  (11/21/2022)  Financial Resource Strain: Low Risk  (11/01/2021)  Physical Activity: Insufficiently Active (11/01/2021)  Social Connections: Moderately Isolated (08/21/2023)  Stress: No Stress Concern Present (11/01/2021)  Tobacco Use: Medium Risk (08/21/2023)     Readmission Risk Interventions     No data to display

## 2023-08-23 NOTE — Discharge Instructions (Signed)
 Can remove transparent dressing and shower 48 hours after surgery Walk as much as possible No heavy lifting >10 lbs Can resume aspirin on 8/11

## 2023-08-25 ENCOUNTER — Inpatient Hospital Stay

## 2023-08-26 ENCOUNTER — Telehealth: Payer: Self-pay | Admitting: *Deleted

## 2023-08-26 ENCOUNTER — Encounter (HOSPITAL_COMMUNITY): Payer: Self-pay | Admitting: Neurosurgery

## 2023-08-26 DIAGNOSIS — E785 Hyperlipidemia, unspecified: Secondary | ICD-10-CM | POA: Diagnosis not present

## 2023-08-26 DIAGNOSIS — Z87891 Personal history of nicotine dependence: Secondary | ICD-10-CM | POA: Diagnosis not present

## 2023-08-26 DIAGNOSIS — D45 Polycythemia vera: Secondary | ICD-10-CM | POA: Diagnosis not present

## 2023-08-26 DIAGNOSIS — E039 Hypothyroidism, unspecified: Secondary | ICD-10-CM | POA: Diagnosis not present

## 2023-08-26 DIAGNOSIS — I10 Essential (primary) hypertension: Secondary | ICD-10-CM | POA: Diagnosis not present

## 2023-08-26 DIAGNOSIS — R32 Unspecified urinary incontinence: Secondary | ICD-10-CM | POA: Diagnosis not present

## 2023-08-26 DIAGNOSIS — Z483 Aftercare following surgery for neoplasm: Secondary | ICD-10-CM | POA: Diagnosis not present

## 2023-08-26 DIAGNOSIS — F1010 Alcohol abuse, uncomplicated: Secondary | ICD-10-CM | POA: Diagnosis not present

## 2023-08-26 DIAGNOSIS — K219 Gastro-esophageal reflux disease without esophagitis: Secondary | ICD-10-CM | POA: Diagnosis not present

## 2023-08-26 DIAGNOSIS — R911 Solitary pulmonary nodule: Secondary | ICD-10-CM | POA: Diagnosis not present

## 2023-08-26 LAB — SURGICAL PATHOLOGY

## 2023-08-26 NOTE — Progress Notes (Signed)
 Complex Care Management Note  Care Guide Note 08/26/2023 Name: Noah King MRN: [id number] DOB: 1945/10/22  Noah King is a 78 y.o. year old male who sees Nafziger, Darleene, NP for primary care. I reached out to Nash C Kutz by phone today to offer complex care management services.  [name] was given information about Complex Care Management services today including:   The Complex Care Management services include support from the care team which includes your Nurse Care Manager, Clinical Social Worker, or Pharmacist.  The Complex Care Management team is here to help remove barriers to the health concerns and goals most important to you. Complex Care Management services are voluntary, and the patient may decline or stop services at any time by request to their care team member.   Complex Care Management Consent Status: Patient agreed to services and verbal consent obtained.   Follow up plan:  Telephone appointment with complex care management team member scheduled for:  09/04/2023  Encounter Outcome:  Patient Scheduled  Thedford Franks, CMA [place]  [facility], [facility] Guide Direct Dial: [phone]  Fax: [phone] Website: [email or web address]

## 2023-08-28 DIAGNOSIS — E785 Hyperlipidemia, unspecified: Secondary | ICD-10-CM | POA: Diagnosis not present

## 2023-08-28 DIAGNOSIS — Z483 Aftercare following surgery for neoplasm: Secondary | ICD-10-CM | POA: Diagnosis not present

## 2023-08-28 DIAGNOSIS — D45 Polycythemia vera: Secondary | ICD-10-CM | POA: Diagnosis not present

## 2023-08-28 DIAGNOSIS — R32 Unspecified urinary incontinence: Secondary | ICD-10-CM | POA: Diagnosis not present

## 2023-08-28 DIAGNOSIS — Z87891 Personal history of nicotine dependence: Secondary | ICD-10-CM | POA: Diagnosis not present

## 2023-08-28 DIAGNOSIS — E039 Hypothyroidism, unspecified: Secondary | ICD-10-CM | POA: Diagnosis not present

## 2023-08-28 DIAGNOSIS — K219 Gastro-esophageal reflux disease without esophagitis: Secondary | ICD-10-CM | POA: Diagnosis not present

## 2023-08-28 DIAGNOSIS — R911 Solitary pulmonary nodule: Secondary | ICD-10-CM | POA: Diagnosis not present

## 2023-08-28 DIAGNOSIS — I10 Essential (primary) hypertension: Secondary | ICD-10-CM | POA: Diagnosis not present

## 2023-08-28 DIAGNOSIS — F1010 Alcohol abuse, uncomplicated: Secondary | ICD-10-CM | POA: Diagnosis not present

## 2023-09-01 DIAGNOSIS — F1010 Alcohol abuse, uncomplicated: Secondary | ICD-10-CM | POA: Diagnosis not present

## 2023-09-01 DIAGNOSIS — E785 Hyperlipidemia, unspecified: Secondary | ICD-10-CM | POA: Diagnosis not present

## 2023-09-01 DIAGNOSIS — Z483 Aftercare following surgery for neoplasm: Secondary | ICD-10-CM | POA: Diagnosis not present

## 2023-09-01 DIAGNOSIS — I10 Essential (primary) hypertension: Secondary | ICD-10-CM | POA: Diagnosis not present

## 2023-09-01 DIAGNOSIS — Z87891 Personal history of nicotine dependence: Secondary | ICD-10-CM | POA: Diagnosis not present

## 2023-09-01 DIAGNOSIS — E039 Hypothyroidism, unspecified: Secondary | ICD-10-CM | POA: Diagnosis not present

## 2023-09-01 DIAGNOSIS — R911 Solitary pulmonary nodule: Secondary | ICD-10-CM | POA: Diagnosis not present

## 2023-09-01 DIAGNOSIS — K219 Gastro-esophageal reflux disease without esophagitis: Secondary | ICD-10-CM | POA: Diagnosis not present

## 2023-09-01 DIAGNOSIS — R32 Unspecified urinary incontinence: Secondary | ICD-10-CM | POA: Diagnosis not present

## 2023-09-01 DIAGNOSIS — D45 Polycythemia vera: Secondary | ICD-10-CM | POA: Diagnosis not present

## 2023-09-03 ENCOUNTER — Encounter (HOSPITAL_COMMUNITY): Payer: Self-pay | Admitting: Neurosurgery

## 2023-09-04 ENCOUNTER — Other Ambulatory Visit: Payer: Self-pay | Admitting: Radiation Therapy

## 2023-09-04 ENCOUNTER — Telehealth: Payer: Self-pay | Admitting: Radiation Therapy

## 2023-09-04 ENCOUNTER — Other Ambulatory Visit: Payer: Self-pay | Admitting: Licensed Clinical Social Worker

## 2023-09-04 DIAGNOSIS — C7931 Secondary malignant neoplasm of brain: Secondary | ICD-10-CM

## 2023-09-04 DIAGNOSIS — R32 Unspecified urinary incontinence: Secondary | ICD-10-CM | POA: Diagnosis not present

## 2023-09-04 DIAGNOSIS — F1010 Alcohol abuse, uncomplicated: Secondary | ICD-10-CM | POA: Diagnosis not present

## 2023-09-04 DIAGNOSIS — Z483 Aftercare following surgery for neoplasm: Secondary | ICD-10-CM | POA: Diagnosis not present

## 2023-09-04 DIAGNOSIS — D45 Polycythemia vera: Secondary | ICD-10-CM | POA: Diagnosis not present

## 2023-09-04 DIAGNOSIS — E039 Hypothyroidism, unspecified: Secondary | ICD-10-CM | POA: Diagnosis not present

## 2023-09-04 DIAGNOSIS — R911 Solitary pulmonary nodule: Secondary | ICD-10-CM | POA: Diagnosis not present

## 2023-09-04 DIAGNOSIS — E785 Hyperlipidemia, unspecified: Secondary | ICD-10-CM | POA: Diagnosis not present

## 2023-09-04 DIAGNOSIS — Z87891 Personal history of nicotine dependence: Secondary | ICD-10-CM | POA: Diagnosis not present

## 2023-09-04 DIAGNOSIS — K219 Gastro-esophageal reflux disease without esophagitis: Secondary | ICD-10-CM | POA: Diagnosis not present

## 2023-09-04 DIAGNOSIS — I10 Essential (primary) hypertension: Secondary | ICD-10-CM | POA: Diagnosis not present

## 2023-09-04 NOTE — Telephone Encounter (Signed)
 I called to introduce myself and update pt on the radiation treatment planning appointments I have set up for him. He preferred a telephone follow-up new with Noah King on 8/27 instead of coming in person and was ok with having the [facility] and Brain MRI on the same day.  Noah King has my contact information and was encouraged to call with any questions.   Devere Perch R.T(R)(T) Radiation Special Procedures Lead

## 2023-09-04 NOTE — Patient Outreach (Signed)
 Complex Care Management   Visit Note  09/04/2023  Name:  Noah King MRN: [id number] DOB: 06-08-1945  Situation: Referral received for Complex Care Management related to MH Assessment - EMMI I obtained verbal consent from Patient.  Visit completed with Patient  on the phone  Background:   Past Medical History:  Diagnosis Date   Allergy    Cataract    left  extraction- right eye forming   Dupuytren's contracture    GERD (gastroesophageal reflux disease)    Hiatal hernia    past hx-  suspect at one time   Hypertension    Polycythemia     Assessment: Patient Reported Symptoms:  Cognitive Cognitive Status: Alert and oriented to person, place, and time, Normal speech and language skills, Insightful and able to interpret abstract concepts Cognitive/Intellectual Conditions Management [RPT]: None reported or documented in medical history or problem list   Health Maintenance Behaviors: Annual physical exam Healing Pattern: Unsure Health Facilitated by: Stress management  Neurological Neurological Review of Symptoms: Other: [place] Neurological Symptoms/Conditions [RPT]: Patient reports a tumor was found in his carnium during an ED visit for unsteady gait. This was removed during emergency surgery and he will have follow up with surgeon in the next week. He will find out then if further radiation treatment is needed. Neurological Management Strategies: Routine screening, Medication therapy Neurological Self-Management Outcome: 3 (uncertain)  HEENT HEENT Symptoms Reported: No symptoms reported      Cardiovascular Cardiovascular Symptoms Reported: No symptoms reported    Respiratory Respiratory Symptoms Reported: No symptoms reported    Endocrine Endocrine Symptoms Reported: No symptoms reported, Other Other symptoms related to hypoglycemia or hyperglycemia: Hypothyroidism Is patient diabetic?: No    Gastrointestinal Gastrointestinal Symptoms Reported: No symptoms reported       Genitourinary Genitourinary Symptoms Reported: No symptoms reported    Integumentary Integumentary Symptoms Reported: No symptoms reported    Musculoskeletal Musculoskelatal Symptoms Reviewed: Difficulty walking, Limited mobility, Unsteady gait Additional Musculoskeletal Details: Pt experiencing difficulty with mobility since surgery on carium - currently receiving PT/OT Musculoskeletal Management Strategies: Activity, Coping strategies, Medical device, Medication therapy Musculoskeletal Self-Management Outcome: 4 (good) Falls in the past year?: No Number of falls in past year: 1 or less Was there an injury with Fall?: No Fall Risk Category Calculator: 0 Patient Fall Risk Level: Low Fall Risk Patient at Risk for Falls Due to: Impaired balance/gait, Impaired mobility Fall risk Follow up: Falls evaluation completed  Psychosocial Psychosocial Symptoms Reported: No symptoms reported Additional Psychological Details: Pt denied any symptoms of anxiety or depression related to sudden surgery - reports he is managing stress well and with help of family.     Quality of Family Relationships: helpful, involved, supportive Do you feel physically threatened by others?: No    09/04/2023    PHQ2-9 Depression Screening   Little interest or pleasure in doing things Not at all  Feeling down, depressed, or hopeless Not at all  PHQ-2 - Total Score 0  Trouble falling or staying asleep, or sleeping too much    Feeling tired or having little energy    Poor appetite or overeating     Feeling bad about yourself - or that you are a failure or have let yourself or your family down    Trouble concentrating on things, such as reading the newspaper or watching television    Moving or speaking so slowly that other people could have noticed.  Or the opposite - being so fidgety or restless that you  have been moving around a lot more than usual    Thoughts that you would be better off dead, or hurting yourself in  some way    PHQ2-9 Total Score    If you checked off any problems, how difficult have these problems made it for you to do your work, take care of things at home, or get along with other people    Depression Interventions/Treatment      There were no vitals filed for this visit.  Medications Reviewed Today     Reviewed by Kit Alm LABOR, LCSW (Social Worker) on 09/04/23 at 1328  Med List Status: <None>   Medication Order Taking? Sig Documenting Provider Last Dose Status Informant  atorvastatin  (LIPITOR) 20 MG tablet [id number] Yes Take 1 tablet (20 mg total) by mouth daily. Nafziger, Darleene, NP  Active Self, Pharmacy Records  docusate sodium  (COLACE) 100 MG capsule [id number] Yes Take 100 mg by mouth daily as needed (constipation). [provider]  Active Self, Pharmacy Records  folic acid  (FOLVITE ) 1 MG tablet [id number] Yes Take 1 tablet (1 mg total) by mouth daily. Krishnan, Gokul, MD  Active   HYDROcodone -acetaminophen  (NORCO/VICODIN) 5-325 MG tablet [id number] Yes Take 1 tablet by mouth every 4 (four) hours as needed for moderate pain (pain score 4-6). Debby Dorn MATSU, MD  Active   levETIRAcetam  (KEPPRA ) 500 MG tablet [id number] Yes Take 1 tablet (500 mg total) by mouth 2 (two) times daily. Debby Dorn MATSU, MD  Active   losartan  (COZAAR ) 25 MG tablet [id number] Yes Take 1 tablet (25 mg total) by mouth daily. Merna Darleene, NP  Active Self, Pharmacy Records  Multiple Vitamins-Minerals (MENS 50+ MULTIVITAMIN) TABS [id number] Yes Take 1 tablet by mouth daily. [provider]  Active Self, Pharmacy Records  pantoprazole  (PROTONIX ) 40 MG tablet [id number] Yes Take 1 tablet (40 mg total) by mouth daily. Debby Dorn MATSU, MD  Active   polyethylene glycol powder (GLYCOLAX /MIRALAX ) 17 GM/SCOOP powder [id number] Yes Take 17 g by mouth daily as needed for mild constipation. Krishnan, Gokul, MD  Active   thiamine  (VITAMIN B1) 100 MG tablet [id number] Yes Take 1 tablet (100 mg total) by  mouth daily. Verdene Purchase, MD  Active             Recommendation:   Continue Current Plan of Care  Follow Up Plan:   Patient has met all care management goals. Care Management case will be closed. Patient has been provided contact information should new needs arise.   Alm Kit, LCSW [facility], [facility] Licensed Clinical Social Worker Care Coordinator [phone]

## 2023-09-04 NOTE — Patient Instructions (Signed)
 Visit Information  Thank you for taking time to visit with me today. Please don't hesitate to contact me if I can be of assistance to you before our next scheduled appointment.   Please call the care guide team at [phone] if you need to cancel or reschedule your appointment.   Following is a copy of your care plan:   Goals Addressed             This Visit's Progress    COMPLETED: [facility] Social Work Care Plan       Problems:   Disease Management support and education needs related to MH assessment related to EMMI  CSW Clinical Goal(s):   Over the next 4 weeks the Patient will demonstrate a reduction in symptoms related to MH assessment .  Interventions:  Mental Health:  Evaluation of current treatment plan related to [facility] Assessment Active listening / Reflection utilized Behavioral Activation reviewed Depression screen reviewed Emotional Support Provided Problem Solving /Task Center strategies reviewed Provided general psycho-education for mental health needs  Patient Goals/Self-Care Activities:  Follow up with provider with any new needs assessed.  Plan:   No further follow up required: Please re-refer with any new needs assessed.        Please call the Suicide and Crisis Lifeline: 988 if you are experiencing a Mental Health or Behavioral Health Crisis or need someone to talk to.  Patient verbalizes understanding of instructions and care plan provided today and agrees to view in MyChart. Active MyChart status and patient understanding of how to access instructions and care plan via MyChart confirmed with patient.     Alm Armor, LCSW [place], [facility] Licensed Clinical Social Worker Care Coordinator [phone]

## 2023-09-05 DIAGNOSIS — E785 Hyperlipidemia, unspecified: Secondary | ICD-10-CM | POA: Diagnosis not present

## 2023-09-05 DIAGNOSIS — R32 Unspecified urinary incontinence: Secondary | ICD-10-CM | POA: Diagnosis not present

## 2023-09-05 DIAGNOSIS — F1010 Alcohol abuse, uncomplicated: Secondary | ICD-10-CM | POA: Diagnosis not present

## 2023-09-05 DIAGNOSIS — Z87891 Personal history of nicotine dependence: Secondary | ICD-10-CM | POA: Diagnosis not present

## 2023-09-05 DIAGNOSIS — E039 Hypothyroidism, unspecified: Secondary | ICD-10-CM | POA: Diagnosis not present

## 2023-09-05 DIAGNOSIS — I10 Essential (primary) hypertension: Secondary | ICD-10-CM | POA: Diagnosis not present

## 2023-09-05 DIAGNOSIS — D45 Polycythemia vera: Secondary | ICD-10-CM | POA: Diagnosis not present

## 2023-09-05 DIAGNOSIS — Z483 Aftercare following surgery for neoplasm: Secondary | ICD-10-CM | POA: Diagnosis not present

## 2023-09-05 DIAGNOSIS — K219 Gastro-esophageal reflux disease without esophagitis: Secondary | ICD-10-CM | POA: Diagnosis not present

## 2023-09-05 DIAGNOSIS — R911 Solitary pulmonary nodule: Secondary | ICD-10-CM | POA: Diagnosis not present

## 2023-09-08 ENCOUNTER — Encounter

## 2023-09-09 ENCOUNTER — Ambulatory Visit (INDEPENDENT_AMBULATORY_CARE_PROVIDER_SITE_OTHER): Admitting: Adult Health

## 2023-09-09 ENCOUNTER — Encounter: Payer: Self-pay | Admitting: Adult Health

## 2023-09-09 VITALS — BP 100/60 | HR 85 | Temp 98.5°F | Ht 70.0 in | Wt 153.0 lb

## 2023-09-09 DIAGNOSIS — R32 Unspecified urinary incontinence: Secondary | ICD-10-CM | POA: Diagnosis not present

## 2023-09-09 DIAGNOSIS — E039 Hypothyroidism, unspecified: Secondary | ICD-10-CM | POA: Diagnosis not present

## 2023-09-09 DIAGNOSIS — F1010 Alcohol abuse, uncomplicated: Secondary | ICD-10-CM

## 2023-09-09 DIAGNOSIS — C799 Secondary malignant neoplasm of unspecified site: Secondary | ICD-10-CM

## 2023-09-09 DIAGNOSIS — R911 Solitary pulmonary nodule: Secondary | ICD-10-CM | POA: Diagnosis not present

## 2023-09-09 DIAGNOSIS — Z9889 Other specified postprocedural states: Secondary | ICD-10-CM

## 2023-09-09 DIAGNOSIS — I10 Essential (primary) hypertension: Secondary | ICD-10-CM

## 2023-09-09 DIAGNOSIS — K219 Gastro-esophageal reflux disease without esophagitis: Secondary | ICD-10-CM

## 2023-09-09 DIAGNOSIS — Z483 Aftercare following surgery for neoplasm: Secondary | ICD-10-CM | POA: Diagnosis not present

## 2023-09-09 DIAGNOSIS — Z87891 Personal history of nicotine dependence: Secondary | ICD-10-CM | POA: Diagnosis not present

## 2023-09-09 DIAGNOSIS — R634 Abnormal weight loss: Secondary | ICD-10-CM

## 2023-09-09 DIAGNOSIS — D45 Polycythemia vera: Secondary | ICD-10-CM | POA: Diagnosis not present

## 2023-09-09 DIAGNOSIS — E785 Hyperlipidemia, unspecified: Secondary | ICD-10-CM | POA: Diagnosis not present

## 2023-09-09 MED ORDER — PANTOPRAZOLE SODIUM 40 MG PO TBEC: 40.0000 mg | DELAYED_RELEASE_TABLET | Freq: Every day | ORAL | 1 refills | Status: DC

## 2023-09-09 NOTE — Progress Notes (Signed)
 Subjective:    Patient ID: Noah King, male    DOB: 1945/12/13, 78 y.o.   MRN: [id number]  HPI 78 year old male who  has a past medical history of Allergy, Cataract, Dupuytren's contracture, GERD (gastroesophageal reflux disease), Hiatal hernia, Hypertension, and Polycythemia.  He presents to the office today for TCM visit  Admit Date 08/11/2023 Discharge Date 08/15/2023  Readmit for Craniotomy on September 16, 2023 Discharge 08/23/2023  He presented to the ED on 728 with right sided and generalized weakness with gait disturbance.  This had been ongoing for 2 years but worsened over the past 2 weeks to the point where he was dependent on a walker.  He was found to have a peripherally enhancing necrotic mass at the junction of the left frontal and parietal lobes with local mass effect with 5 mm enhancing lesion of the posterior hippocampus.  CTA revealed multiple calcified and noncalcified nodules throughout the bilateral lungs-largest 7 mm nodule at the right base close to the diaphragm and right hilar lymphadenopathy.   Hospital Course  Left sided brain mass concerning for metastatic disease - Tuesday most likely to be metastatic.  Probably of primary in the lung based on chest CT.  He was seen by pulmonary underwent bronchoscopy along with lavage and aspiration biopsy.  Initial results are negative for malignant cells.  He was seen by radiation oncology is considering SRS, however in the setting of lack of diagnosis this was not an option anymore.  Postoperative radiation needed to be considered. - He was seen by surgery.  Plan for tumor resection on September 16, 2023.  He remained on death and lacks his own.  Essential Hypertension  - Patient was on verapamil  and losartan  at home.  He was started back on verapamil  however noted to have bradycardia and then this was discontinued.  Was resumed on losartan   GERD - Continue PPI   Alcohol Abuse  - He drinks a beer on a daily basis, no history of  withdrawal symptoms.  No withdrawal symptoms noted throughout hospital admission  S/p Craniotomy  - Underwent left craniotomy with motor mapping for resection of a large cystic brain mass in the somatosensory cortex.  This operatively his preoperative right sided weakness was improved and his left leg proprioceptive Cold-Eeze were stable, he was admitted to the ICU for monitoring.  Postoperative MRI showed good resection with no complicating postoperative changes.  Physical therapy recommended home health PT and he was downgraded to progressive care.  He was able to be mobilized with the help of PT, pain was well-controlled with oral meds.  Patient reported improvement in ambulation ability postop.  He was discharged on 08/23/2023. -His surgical pathology showed metastatic carcinoma with papillary features consistent with lung origin. He has an upcoming televisit appointment with oncology.   Today he presents to the office with his daughter, he seems to be in good spirits.  He is working with home health physical therapy to get his strength back.  He has noticed that his weight is down and he has been increasing his protein intake both through meats and with a protein shake.  He does report that his appetite is good.  He has not had any pain status post craniotomy, denies headaches, blurred vision, or facial droop.  He is ready to meet with oncology inform of plan of care due to flight his cancer.  He needs a refill of Protonix    Review of Systems  Constitutional: Negative.   Respiratory: Negative.  Cardiovascular: Negative.   Gastrointestinal: Negative.   Musculoskeletal:  Positive for gait problem.  Skin: Negative.   Allergic/Immunologic: Negative.   Psychiatric/Behavioral: Negative.    All other systems reviewed and are negative.  Past Medical History:  Diagnosis Date   Allergy    Cataract    left  extraction- right eye forming   Dupuytren's contracture    GERD (gastroesophageal  reflux disease)    Hiatal hernia    past hx-  suspect at one time   Hypertension    Polycythemia     Social History   Socioeconomic History   Marital status: Single    Spouse name: Not on file   Number of children: Not on file   Years of education: Not on file   Highest education level: Not on file  Occupational History   Not on file  Tobacco Use   Smoking status: Former    Current packs/day: 0.00    Types: Cigarettes    Quit date: 01/14/1985    Years since quitting: 38.6   Smokeless tobacco: Never  Vaping Use   Vaping status: Never Used  Substance and Sexual Activity   Alcohol use: Yes    Alcohol/week: 20.0 standard drinks of alcohol    Types: 20 Cans of beer per week   Drug use: No   Sexual activity: Yes    Partners: Female  Other Topics Concern   Not on file  Social History Narrative   Retired- He worked at [employer]   Divorced   One daughter who lives in [place]   Social Drivers of Health   Financial Resource Strain: Low Risk  (11/01/2021)   Overall Financial Resource Strain (CARDIA)    Difficulty of Paying Living Expenses: Not hard at all  Food Insecurity: No Food Insecurity (09/04/2023)   Hunger Vital Sign    Worried About Running Out of Food in the Last Year: Never true    Ran Out of Food in the Last Year: Never true  Transportation Needs: No Transportation Needs (09/04/2023)   PRAPARE - Administrator, Civil Service (Medical): No    Lack of Transportation (Non-Medical): No  Physical Activity: Insufficiently Active (11/01/2021)   Exercise Vital Sign    Days of Exercise per Week: 7 days    Minutes of Exercise per Session: 10 min  Stress: No Stress Concern Present (11/01/2021)   [employer] of Occupational Health - Occupational Stress Questionnaire    Feeling of Stress : Not at all  Social Connections: Moderately Isolated (08/21/2023)   Social Connection and Isolation Panel    Frequency of Communication with Friends and Family: More than three  times a week    Frequency of Social Gatherings with Friends and Family: More than three times a week    Attends Religious Services: Never    Database administrator or Organizations: No    Attends Banker Meetings: 1 to 4 times per year    Marital Status: Divorced  Intimate Partner Violence: Not At Risk (09/04/2023)   Humiliation, Afraid, Rape, and Kick questionnaire    Fear of Current or Ex-Partner: No    Emotionally Abused: No    Physically Abused: No    Sexually Abused: No    Past Surgical History:  Procedure Laterality Date   APPLICATION OF CRANIAL NAVIGATION Left 08/21/2023   Procedure: COMPUTER-ASSISTED NAVIGATION, FOR CRANIAL PROCEDURE;  Surgeon: Debby Dorn MATSU, MD;  Location: [facility] OR;  Service: Neurosurgery;  Laterality: Left;  cataract extraction left eye     COLONOSCOPY     CRANIOTOMY Left 08/21/2023   Procedure: CRANIOTOMY TUMOR EXCISION;  Surgeon: Debby Dorn MATSU, MD;  Location: [facility] OR;  Service: Neurosurgery;  Laterality: Left;  LT CRANI FOR TUMOR RESECTION   DUPUYTREN CONTRACTURE RELEASE Left    POLYPECTOMY     rt knee arthroscopic     VIDEO BRONCHOSCOPY WITH ENDOBRONCHIAL ULTRASOUND Right 08/13/2023   Procedure: BRONCHOSCOPY, WITH EBUS;  Surgeon: Jude Harden GAILS, MD;  Location: [facility] ENDOSCOPY;  Service: Cardiopulmonary;  Laterality: Right;    Family History  Problem Relation Age of Onset   Stroke Mother    Colon cancer Father    Breast cancer Sister    Cancer Sister        breast    Colon polyps Neg Hx    Esophageal cancer Neg Hx     Allergies  Allergen Reactions   Lotensin  [Benazepril  Hcl] Cough    Current Outpatient Medications on File Prior to Visit  Medication Sig Dispense Refill   atorvastatin  (LIPITOR) 20 MG tablet Take 1 tablet (20 mg total) by mouth daily. 90 tablet 3   docusate sodium  (COLACE) 100 MG capsule Take 100 mg by mouth daily as needed (constipation).     folic acid  (FOLVITE ) 1 MG tablet Take 1 tablet (1 mg total) by mouth  daily. 30 tablet 0   losartan  (COZAAR ) 25 MG tablet Take 1 tablet (25 mg total) by mouth daily. 90 tablet 3   Multiple Vitamins-Minerals (MENS 50+ MULTIVITAMIN) TABS Take 1 tablet by mouth daily.     polyethylene glycol powder (GLYCOLAX /MIRALAX ) 17 GM/SCOOP powder Take 17 g by mouth daily as needed for mild constipation. 238 g 0   thiamine  (VITAMIN B1) 100 MG tablet Take 1 tablet (100 mg total) by mouth daily. 30 tablet 0   No current facility-administered medications on file prior to visit.    BP 100/60   Pulse 85   Temp 98.5 F (36.9 C) (Oral)   Ht 5' 10 (1.778 m)   Wt 153 lb (69.4 kg)   SpO2 96%   BMI 21.95 kg/m       Objective:   Physical Exam Constitutional:      Appearance: Normal appearance.  Cardiovascular:     Rate and Rhythm: Normal rate and regular rhythm.     Pulses: Normal pulses.     Heart sounds: Normal heart sounds.  Pulmonary:     Effort: Pulmonary effort is normal.     Breath sounds: Normal breath sounds.  Musculoskeletal:        General: Normal range of motion.  Skin:    General: Skin is warm and dry.     Comments: Well healed surgical scan across the crown of his head.   Neurological:     General: No focal deficit present.     Mental Status: He is alert and oriented to person, place, and time.     Motor: Weakness present.     Gait: Gait abnormal.  Psychiatric:        Mood and Affect: Mood normal.        Behavior: Behavior normal.        Thought Content: Thought content normal.        Judgment: Judgment normal.        Assessment & Plan:  1. S/P craniotomy (Primary) [facility] notes, discharge instructions, labs, imaging, and medication changes.  All questions answered to the best of my ability  2. Metastatic malignant neoplasm, unspecified site [facility]) -Follow-up with oncology and radiation oncology as directed  3. Essential hypertension - Well controlled with Losartan  25 mg daily.  - No change in therapy at this time  4.  Gastroesophageal reflux disease without esophagitis - Continue with Protonix . Refill sent in   5. Alcohol abuse - Has not had any alcohol since his surgery.  - Continue to refrain   6. Unintentional weight loss - Thankfully he has a good appetite. Continue with higher protein diet.    Careen Mauch, NP

## 2023-09-09 NOTE — Progress Notes (Signed)
 Location/Histology of Brain Tumor: Left Frontal/Parietal Lobe Brain, Lung Primary.  Patient presented to the ER 08/11/2023 with symptoms of right sided weakness and proprioception loss and was found to have a large left parietal cystic mass in the left somatosensory cortex.  MRI Brain 08/22/2023:  Left parietal craniotomy for tumor resection with minimal residual enhancement along the deep margin of the operative bed and stable 4 mm focus of enhancement in the posterior left hippocampus.  MRI Brain 08/11/2023: 4.3 x 4.8 cm peripherally enhancing and centrally necrotic mass at the junction of the frontal and parietal lobes on the left, as described. Moderate surrounding vasogenic edema. Local mass effect with partial effacement of the left lateral ventricle. No midline shift.  Additional 5 mm enhancing lesion at the junction of the posterior hippocampus and crus of fornix on the left.   Surgical Pathology: Left Craniotomy 08/21/2023     Past or anticipated interventions, if any, per neurosurgery:  Dr. Debby -Craniotomy- tumor excision 08/21/2023   Past or anticipated interventions, if any, per medical oncology:   Dose of Decadron , if applicable:   Recent neurologic symptoms, if any:  Seizures:  Headaches:  Nausea:  Dizziness/ataxia:  Difficulty with hand coordination:  Focal numbness/weakness:  Visual deficits/changes:  Confusion/Memory deficits:     SAFETY ISSUES: Prior radiation?  Pacemaker/ICD?  Possible current pregnancy? N/a Is the patient on methotrexate?   Additional Complaints / other details:

## 2023-09-10 ENCOUNTER — Telehealth: Payer: Self-pay | Admitting: Radiation Oncology

## 2023-09-10 ENCOUNTER — Telehealth: Payer: Self-pay | Admitting: *Deleted

## 2023-09-10 ENCOUNTER — Encounter: Payer: Self-pay | Admitting: Radiation Oncology

## 2023-09-10 ENCOUNTER — Other Ambulatory Visit: Payer: Self-pay

## 2023-09-10 ENCOUNTER — Ambulatory Visit
Admission: RE | Admit: 2023-09-10 | Discharge: 2023-09-10 | Disposition: A | Discharge status: Home / Self Care | Source: Ambulatory Visit | Attending: Radiation Oncology | Admitting: Radiation Oncology

## 2023-09-10 VITALS — Ht 70.0 in | Wt 153.0 lb

## 2023-09-10 DIAGNOSIS — Z87891 Personal history of nicotine dependence: Secondary | ICD-10-CM | POA: Diagnosis not present

## 2023-09-10 DIAGNOSIS — R911 Solitary pulmonary nodule: Secondary | ICD-10-CM | POA: Diagnosis not present

## 2023-09-10 DIAGNOSIS — C7931 Secondary malignant neoplasm of brain: Secondary | ICD-10-CM

## 2023-09-10 DIAGNOSIS — K219 Gastro-esophageal reflux disease without esophagitis: Secondary | ICD-10-CM | POA: Diagnosis not present

## 2023-09-10 DIAGNOSIS — F1010 Alcohol abuse, uncomplicated: Secondary | ICD-10-CM | POA: Diagnosis not present

## 2023-09-10 DIAGNOSIS — Z483 Aftercare following surgery for neoplasm: Secondary | ICD-10-CM | POA: Diagnosis not present

## 2023-09-10 DIAGNOSIS — E785 Hyperlipidemia, unspecified: Secondary | ICD-10-CM | POA: Diagnosis not present

## 2023-09-10 DIAGNOSIS — R32 Unspecified urinary incontinence: Secondary | ICD-10-CM | POA: Diagnosis not present

## 2023-09-10 DIAGNOSIS — E039 Hypothyroidism, unspecified: Secondary | ICD-10-CM | POA: Diagnosis not present

## 2023-09-10 DIAGNOSIS — D45 Polycythemia vera: Secondary | ICD-10-CM | POA: Diagnosis not present

## 2023-09-10 DIAGNOSIS — I10 Essential (primary) hypertension: Secondary | ICD-10-CM | POA: Diagnosis not present

## 2023-09-10 DIAGNOSIS — C718 Malignant neoplasm of overlapping sites of brain: Secondary | ICD-10-CM | POA: Diagnosis not present

## 2023-09-10 DIAGNOSIS — C3431 Malignant neoplasm of lower lobe, right bronchus or lung: Secondary | ICD-10-CM | POA: Insufficient documentation

## 2023-09-10 NOTE — Telephone Encounter (Signed)
 8/27 Outgoing Referral forward to Hematology/Oncology - Dr. Jeannett office.

## 2023-09-10 NOTE — Telephone Encounter (Signed)
 CALLED PATIENT TO INFORM OF PET SCAN FOR 09/11/23- ARRIVAL TIME- 12 PM @ [facility], PATIENT TO HAVE WATER ONLY- 6 HRS. PRIOR TO SCAN, SPOKE WITH PATIENT AND HE IS AWARE OF THIS SCAN AND THE INSTRUCTIONS

## 2023-09-10 NOTE — Progress Notes (Signed)
 Radiation Oncology         [phone] ________________________________  Outpatient Follow Up - Conducted via telephone at patient request.  I spoke with the patient to conduct this follow up visit via telephone. The patient was notified in advance and was offered an in person or telemedicine meeting to allow for face to face communication but instead preferred to proceed with a telephone visit.  Name: Noah King        MRN: [id number]  Date of Service: 09/10/2023 DOB: 1945/11/08  RR:Wjqsphzm, Darleene, NP  Debby Dorn MATSU, MD     REFERRING PHYSICIAN: Debby Dorn MATSU, MD   DIAGNOSIS: The encounter diagnosis was Metastasis to brain [facility]   HISTORY OF PRESENT ILLNESS: Noah King is a 78 y.o. male seen at the request of Dr. Dorn Debby for a new diagnosis of metastatic lung cancer involving the brain.  The patient presented to the emergency room on 08/11/23 experiencing weakness with his lower extremities and a fall about a week ago.  He has been experiencing weakness over the course of approximately 1 month.  He was seen in the emergency room and imaging showed a 4.3 x 4.8 cm peripherally enhancing mass with central necrosis at the juncture of the frontal and parietal lobes on the left side with moderate vasogenic edema, local mass effect with partial effacement of the left lateral ventricle, and an additional 5 mm enhancing lesion at the juncture of the posterior hippocampus and crus of the fornix on the left.  Nonspecific chronic microhemorrhages were also noted in a background of parenchymal atrophy and chronic small vessel disease.  CT angio of the head and neck showed a moderately calcified and noncalcified plaque within the proximal left internal carotid artery with 50% luminal stenosis.  Chest abdomen and pelvis CT was performed, there were multiple prominent mediastinal and hilar lymph nodes which were felt to be indeterminant, he had linear and irregular scarring in  the right lung apex with patchy areas of platelike atelectasis throughout bilateral lungs without specific mass.  There are multiple calcified and noncalcified nodules throughout both lungs the largest in the right lower lobe measured 7 mm in greatest dimension.  Multiple simple cysts in both kidneys were noted the largest measuring up to 3.2 cm, and several loops of small bowel in the left abdomen and right lower quadrant showed mild to moderate circumferential wall thickening with perienteric fat stranding and prominence of both fossa recta compatible with enteritis without any other focal findings.  There was a well-defined 1.7 cm lytic lesion in the upper portion of the S1 vertebral body incompletely characterized but favored to be benign and/or degenerative.     He did proceed with bronchoscopy with EBUS on 08/13/23 and cytology did confirm lymphocytes to indicate a representative sampling of the mediastinal nodes, however preliminary and final testing did not show any malignant cells. Given this, he was discharged and proceeded with craniotomy on 08/21/23 and final pathology showed metastatic carcinoma with papillary features consistent with lung cancer. Postoperative imaging on 08/22/23 showed minimal residual enhancement along the deep margin of the operative bed and stable 4 mm focus of enhancement in the posterior left hippocampus. Slight improvement in the mass effect of the left parietal lobe, and   PREVIOUS RADIATION THERAPY: No   PAST MEDICAL HISTORY:  Past Medical History:  Diagnosis Date   Allergy    Cataract    left  extraction- right eye forming   Dupuytren's contracture  GERD (gastroesophageal reflux disease)    Hiatal hernia    past hx-  suspect at one time   Hypertension    Polycythemia        PAST SURGICAL HISTORY: Past Surgical History:  Procedure Laterality Date   APPLICATION OF CRANIAL NAVIGATION Left 08/21/2023   Procedure: COMPUTER-ASSISTED NAVIGATION, FOR CRANIAL  PROCEDURE;  Surgeon: Debby Dorn MATSU, MD;  Location: [facility] OR;  Service: Neurosurgery;  Laterality: Left;   cataract extraction left eye     COLONOSCOPY     CRANIOTOMY Left 08/21/2023   Procedure: CRANIOTOMY TUMOR EXCISION;  Surgeon: Debby Dorn MATSU, MD;  Location: [facility] OR;  Service: Neurosurgery;  Laterality: Left;  LT CRANI FOR TUMOR RESECTION   DUPUYTREN CONTRACTURE RELEASE Left    HERNIA REPAIR  2024   POLYPECTOMY     rt knee arthroscopic     VIDEO BRONCHOSCOPY WITH ENDOBRONCHIAL ULTRASOUND Right 08/13/2023   Procedure: BRONCHOSCOPY, WITH EBUS;  Surgeon: Jude Harden GAILS, MD;  Location: [facility] ENDOSCOPY;  Service: Cardiopulmonary;  Laterality: Right;     FAMILY HISTORY:  Family History  Problem Relation Age of Onset   Stroke Mother    Colon cancer Father    Breast cancer Sister    Cancer Sister        breast    Colon polyps Neg Hx    Esophageal cancer Neg Hx      SOCIAL HISTORY:  reports that he quit smoking about 38 years ago. His smoking use included cigarettes. He has never used smokeless tobacco. He reports current alcohol use of about 20.0 standard drinks of alcohol per week. He reports that he does not use drugs. The patient is single. He's retired from [employer] in multiple roles. He lives in [place] and his daughter Sherri Schoenberg lives in [place].    ALLERGIES: Lotensin  [benazepril  hcl]   MEDICATIONS:  Current Outpatient Medications  Medication Sig Dispense Refill   atorvastatin  (LIPITOR) 20 MG tablet Take 1 tablet (20 mg total) by mouth daily. 90 tablet 3   docusate sodium  (COLACE) 100 MG capsule Take 100 mg by mouth daily as needed (constipation).     folic acid  (FOLVITE ) 1 MG tablet Take 1 tablet (1 mg total) by mouth daily. 30 tablet 0   losartan  (COZAAR ) 25 MG tablet Take 1 tablet (25 mg total) by mouth daily. 90 tablet 3   Multiple Vitamins-Minerals (MENS 50+ MULTIVITAMIN) TABS Take 1 tablet by mouth daily.     pantoprazole  (PROTONIX ) 40 MG tablet Take 1 tablet  (40 mg total) by mouth daily. 90 tablet 1   polyethylene glycol powder (GLYCOLAX /MIRALAX ) 17 GM/SCOOP powder Take 17 g by mouth daily as needed for mild constipation. 238 g 0   thiamine  (VITAMIN B1) 100 MG tablet Take 1 tablet (100 mg total) by mouth daily. 30 tablet 0   No current facility-administered medications for this encounter.     REVIEW OF SYSTEMS: On review of systems, the patient reports that he is doing well. He's no longer taking steroids or antiepileptics. He feels like he's healing well. He denies any weakness or falls. No other complaints are verbalized.       PHYSICAL EXAM:  Unable to assess due to encounter type    ECOG = 0  0 - Asymptomatic (Fully active, able to carry on all predisease activities without restriction)  1 - Symptomatic but completely ambulatory (Restricted in physically strenuous activity but ambulatory and able to carry out work of a light or sedentary nature. For  example, light housework, office work)  2 - Symptomatic, <50% in bed during the day (Ambulatory and capable of all self care but unable to carry out any work activities. Up and about more than 50% of waking hours)  3 - Symptomatic, >50% in bed, but not bedbound (Capable of only limited self-care, confined to bed or chair 50% or more of waking hours)  4 - Bedbound (Completely disabled. Cannot carry on any self-care. Totally confined to bed or chair)  5 - Death   Raylene MM, Creech RH, Tormey DC, et al. [phone]). Toxicity and response criteria of the [facility]. Am. DOROTHA Bridges. Oncol. 5 (6): 649-55    LABORATORY DATA:  Lab Results  Component Value Date   WBC 19.1 (H) 08/22/2023   HGB 11.7 (L) 08/22/2023   HCT 35.8 (L) 08/22/2023   MCV 85.0 08/22/2023   PLT 197 08/22/2023   Lab Results  Component Value Date   NA 138 08/22/2023   K 3.8 08/22/2023   CL 110 08/22/2023   CO2 20 (L) 08/22/2023   Lab Results  Component Value Date   ALT 39 08/22/2023   AST 20  08/22/2023   ALKPHOS 37 (L) 08/22/2023   BILITOT 0.5 08/22/2023      RADIOGRAPHY: MR BRAIN W WO CONTRAST Result Date: 08/22/2023 EXAM: MRI BRAIN WITH AND WITHOUT CONTRAST 08/22/2023 06:35:02 AM TECHNIQUE: Multiplanar multisequence MRI of the head/brain was performed with and without the administration of intravenous contrast. 8mL gadobutrol  (GADAVIST ) 1 MMOL/ML injection 8 mL GADOBUTROL  1 MMOL/ML IV SOLN. COMPARISON: Brain MRI 08/12/23. CLINICAL HISTORY: Craniotomy, post-op. FINDINGS: BRAIN AND VENTRICLES: Left parietal craniotomy noted for resection of tumor. Minimal enhancement remains along the deep margin of the operative bed. The 4 mm focus of enhancement in the posterior left hippocampus is stable. Mass effect in the left parietal lobe is slightly improved. Extensive T2 and FLAIR signal hyperintensity remains. No pneumocephalus is present. Periventricular and scattered subcortical T2 hyperintensities otherwise are mildly advanced for age. Multiple foci of susceptibility are present involving the cerebellum bilaterally, medial left occipital lobe, posterior left parietal lobe, and anterior left frontal lobe. Blood products are noted within the operative bed. ORBITS: Left lens replacement is present. SINUSES: A fluid level is present in the right maxillary sinus. The right frontal sinus is opacified. BONES AND SOFT TISSUES: No acute soft tissue abnormality. IMPRESSION: 1. Left parietal craniotomy for tumor resection with minimal residual enhancement along the deep margin of the operative bed and stable 4 mm focus of enhancement in the posterior left hippocampus. 2. Slightly improved mass effect in the left parietal lobe with extensive T2/FLAIR hyperintensity. 3. Multiple foci of susceptibility involving the cerebellum bilaterally, medial left occipital lobe, posterior left parietal lobe, and anterior left frontal lobe. Blood products are noted within the operative bed. 4. Mildly advanced periventricular and  scattered subcortical T2 hyperintensities for age. 5. Left lens replacement. 6. Fluid level in the right maxillary sinus and opacification of the right frontal sinus. Electronically signed by: Lonni Necessary MD 08/22/2023 06:54 AM EDT RP Workstation: [id number]   MR BRAIN W WO CONTRAST Result Date: 08/13/2023 CLINICAL DATA:  Brain/CNS neoplasm, staging preoperative SRS treatment planning EXAM: MRI HEAD WITHOUT AND WITH CONTRAST TECHNIQUE: Multiplanar, multiecho pulse sequences of the brain and surrounding structures were obtained without and with intravenous contrast. CONTRAST:  8mL GADAVIST  GADOBUTROL  1 MMOL/ML IV SOLN COMPARISON:  MRI head 08/11/2023. FINDINGS: Brain: When accounting for differences in technique, no change in a left frontoparietal  mass with peripheral enhancement central necrosis. Similar abutment of the overlying dura with possible involvement. Similar mass effect and surrounding edema. Similar additional 5 mm focus of enhancement and in the left posterior hippocampal region. No other lesions identified. No evidence of acute hemorrhage outside of the mass. Similar multifocal chronic microhemorrhages in the supratentorial and infratentorial brain with associated susceptibility artifact. No evidence of acute infarct, midline shift or hydrocephalus. Patchy T2/FLAIR hyperintensities the white matter compatible with chronic microvascular ischemic disease. Vascular: Normal flow voids. Skull and upper cervical spine: Normal marrow signal. Sinuses/Orbits: Similar paranasal sinus mucosal thickening air-fluid levels. No acute orbital findings. Other: No sizable mastoid effusions. IMPRESSION: When accounting for differences in technique, no substantial change in left frontoparietal and left hippocampal enhancing lesions, as detailed above. No new lesions identified. No progressive mass effect. Electronically Signed   By: Gilmore GORMAN Molt M.D.   On: 08/13/2023 01:21   CT CHEST ABDOMEN PELVIS W  CONTRAST Result Date: 08/11/2023 CLINICAL DATA:  Brain/CNS neoplasm, staging. Increased weakness. * Tracking Code: BO * EXAM: CT CHEST, ABDOMEN, AND PELVIS WITH CONTRAST TECHNIQUE: Multidetector CT imaging of the chest, abdomen and pelvis was performed following the standard protocol during bolus administration of intravenous contrast. RADIATION DOSE REDUCTION: This exam was performed according to the departmental dose-optimization program which includes automated exposure control, adjustment of the mA and/or kV according to patient size and/or use of iterative reconstruction technique. CONTRAST:  75mL OMNIPAQUE  IOHEXOL  350 MG/ML SOLN COMPARISON:  None Available. FINDINGS: CT CHEST FINDINGS Cardiovascular: Normal cardiac size. No pericardial effusion. No aortic aneurysm. There are coronary artery calcifications, in keeping with coronary artery disease. There are also mild peripheral atherosclerotic vascular calcifications of thoracic aorta and its major branches. Mediastinum/Nodes: Visualized thyroid  gland appears grossly unremarkable. No solid / cystic mediastinal masses. The esophagus is nondistended precluding optimal assessment. There are few mildly prominent mediastinal and hilar lymph nodes, which are indeterminate in etiology. No axillary lymphadenopathy by size criteria. Lungs/Pleura: The central tracheo-bronchial tree is patent. Linear irregular area of scarring noted in the right lung apex. There are additional patchy areas of linear, plate-like atelectasis and/or scarring throughout bilateral lungs. No mass or consolidation. No pleural effusion or pneumothorax. There multiple calcified and noncalcified nodules throughout bilateral lungs (marked with electronic arrow sign on series 2006), with largest in the right lung lower lobe measuring 7 x 7 mm. In the absence of prior imaging and in this patient with history of malignancy, these are suspicious. Short-term follow-up examination in 6-12 weeks is  recommended to evaluate for the growth pattern. Musculoskeletal: The visualized soft tissues of the chest wall are grossly unremarkable. No suspicious osseous lesions. There are mild multilevel degenerative changes in the visualized spine. CT ABDOMEN PELVIS FINDINGS Hepatobiliary: The liver is normal in size. Non-cirrhotic configuration. No suspicious mass. No intrahepatic or extrahepatic bile duct dilation. No calcified gallstones. Normal gallbladder wall thickness. No pericholecystic inflammatory changes. Pancreas: Unremarkable. No pancreatic ductal dilatation or surrounding inflammatory changes. Spleen: Within normal limits. No focal lesion. Adrenals/Urinary Tract: Adrenal glands are unremarkable. No suspicious renal mass. There are multiple scattered simple cysts throughout bilateral kidneys with largest arising from the right kidney lower pole measuring up to 2.7 x 3.2 cm. No obstructive uropathy on either side. Evaluation for nonobstructing renal calculi is limited due to excreted contrast in the collecting system. Unremarkable urinary bladder. Stomach/Bowel: There is a small sliding hiatal hernia. No disproportionate dilation of the small or large bowel loops. The appendix was not visualized; however  there is no acute inflammatory process in the right lower quadrant. There are several loops of small bowel in the left abdomen and right lower quadrant (marked with electronic arrow sign on series 5) exhibiting mild-to-moderate circumferential wall thickening with perienteric fat stranding and prominence of fossa recta, compatible with enteritis, most likely infective/inflammatory in etiology. Vascular/Lymphatic: No ascites or pneumoperitoneum. No abdominal or pelvic lymphadenopathy, by size criteria. No aneurysmal dilation of the major abdominal arteries. There are mild peripheral atherosclerotic vascular calcifications of the aorta and its major branches. Reproductive: Normal size prostate. Symmetric seminal  vesicles. Other: There are fat containing umbilical and bilateral inguinal hernias. The soft tissues and abdominal wall are otherwise unremarkable. Musculoskeletal: No suspicious osseous lesions. There is a relatively well-defined 1.2 x 1.7 cm lytic lesion in the upper portion of S1 vertebrae, incompletely characterized on the current exam but favored benign/degenerative in etiology. There are mild multilevel degenerative changes in the visualized spine. IMPRESSION: 1. There are several loops of small bowel in the left abdomen and right lower quadrant exhibiting mild-to-moderate circumferential wall thickening with perienteric fat stranding and prominence of vasa recta, compatible with enteritis, most likely infective/inflammatory in etiology. 2. There are multiple calcified and noncalcified nodules throughout bilateral lungs, with largest in the right lung lower lobe measuring 7 x 7 mm. In the absence of prior imaging and in this patient with history of malignancy, these are suspicious. Short-term follow-up examination in 6-12 weeks is recommended to evaluate for the growth pattern. 3. No metastatic disease identified within the abdomen or pelvis. 4. Multiple other nonacute observations, as described above. Aortic Atherosclerosis (ICD10-I70.0). Electronically Signed   By: Ree Molt M.D.   On: 08/11/2023 16:26   DG Chest Portable 1 View Result Date: 08/11/2023 CLINICAL DATA:  Several day history of increased dizziness EXAM: PORTABLE CHEST 1 VIEW COMPARISON:  Chest radiograph dated 12/08/2019 FINDINGS: Normal lung volumes. No focal consolidations. Costophrenic angles are not entirely included within the field of view. No definite pleural effusion or pneumothorax. The heart size and mediastinal contours are within normal limits. No acute osseous abnormality. IMPRESSION: No acute disease. Electronically Signed   By: Limin  Xu M.D.   On: 08/11/2023 15:31   CT ANGIO HEAD NECK W WO CM Result Date:  08/11/2023 CLINICAL DATA:  Stroke/TIA, determine embolic source EXAM: CT ANGIOGRAPHY HEAD AND NECK WITH AND WITHOUT CONTRAST TECHNIQUE: Multidetector CT imaging of the head and neck was performed using the standard protocol during bolus administration of intravenous contrast. Multiplanar CT image reconstructions and MIPs were obtained to evaluate the vascular anatomy. Carotid stenosis measurements (when applicable) are obtained utilizing NASCET criteria, using the distal internal carotid diameter as the denominator. RADIATION DOSE REDUCTION: This exam was performed according to the departmental dose-optimization program which includes automated exposure control, adjustment of the mA and/or kV according to patient size and/or use of iterative reconstruction technique. CONTRAST:  75mL OMNIPAQUE  IOHEXOL  350 MG/ML SOLN COMPARISON:  None Available. FINDINGS: CT HEAD FINDINGS Brain: There is a mixed solid and cystic mass present within the left parietal lobe, measuring approximately 4.6 x 3.0 x 4.0 cm. The solid component is relatively dense to cerebral cortex. There is mild surrounding edema, but there is no shift of the midline structures. There is generalized cerebral volume loss and mild to moderate diffuse cerebral white matter disease. Vascular: Moderate calcific atheromatous disease. Skull: No osseous lesions. Sinuses/Orbits: Moderate mucosal disease within the frontal, ethmoid and maxillary sinuses. Status post left lens replacement. Other: None. Review of  the MIP images confirms the above findings CTA NECK FINDINGS Aortic arch: Mild calcific atheromatous disease. Right carotid system: The common carotid artery is normal in caliber. There is minimal calcific plaque within the carotid bulb and origin of the internal carotid artery, but no luminal stenosis. Left carotid system: There is calcific and noncalcific plaque present within the proximal internal carotid artery with approximately 50% luminal stenosis. The  remainder of the cervical segment is normal in caliber. Vertebral arteries: The vertebral arteries are codominant and normal in caliber. Skeleton: Mild multilevel degenerative disc disease. Other neck: None. Upper chest: There are some streaky opacities present within the right lung apex. There some hazy dependent opacities within the upper lobes bilaterally. There is a calcified granuloma also present posterior medially within the left upper lobe. Review of the MIP images confirms the above findings CTA HEAD FINDINGS Anterior circulation: There is mild calcific atheromatous disease within the carotid siphons but no significant stenosis. The anterior and middle cerebral arteries are normal in caliber and unremarkable in appearance. Posterior circulation: The vertebrobasilar system is unremarkable. There is fetal origin of the left posterior cerebral artery. The posterior cerebral arteries and cerebellar arteries are normal in caliber. Venous sinuses: Widely patent. Anatomic variants: Fetal origin of the left posterior cerebral artery. Review of the MIP images confirms the above findings IMPRESSION: 1. 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 2. Moderate calcific and noncalcific plaque within the proximal left internal carotid artery, with estimated 50% luminal stenosis. These results were called by telephone at the time of interpretation on 08/11/2023 at 2:31 pm to provider Dr. CARON SALT , who verbally acknowledged these results. Electronically Signed   By: Evalene Coho M.D.   On: 08/11/2023 14:37   MR BRAIN W WO CONTRAST Result Date: 08/11/2023 CLINICAL DATA:  Provided history: Neuro deficit, acute, stroke suspected. EXAM: MRI HEAD WITHOUT AND WITH CONTRAST TECHNIQUE: Multiplanar, multiecho pulse sequences of the brain and surrounding structures were obtained without and with intravenous contrast. CONTRAST:  8mL GADAVIST  GADOBUTROL  1 MMOL/ML IV SOLN COMPARISON:  None.  FINDINGS: Brain: Generalized cerebral atrophy. 4.3 x 3.1 x 4.8 cm mass at the junction of the frontal and parietal lobes on the left (with involvement of the perirolandic region). The mass has peripheral enhancement and appears centrally necrotic. The mass extends to abut, and may involve, the overlying dura (for instance as seen on series 11, image 10). Susceptibility-weighted signal loss associated with this lesion (greatest superiorly), likely reflecting hemosiderin deposition. Moderate surrounding vasogenic edema. Local mass effect with partial effacement of the posterior body and atrium of the left lateral ventricle. No midline shift. Additional 5 mm enhancing lesion at the junction of the posterior hippocampus and crus of fornix on the left (series 10, image 39) (for instance as seen on series 11, image 13). Subtle edema also present at this site (series 6, image 14). Background mild multifocal T2 FLAIR hyperintense signal abnormality within the cerebral white matter, nonspecific but compatible chronic small vessel ischemic disease. Several nonspecific chronic microhemorrhages scattered within the supratentorial and infratentorial brain. There is no acute infarct. No extra-axial fluid collection. No midline shift. Vascular: Maintained flow voids within the proximal large arterial vessels. Skull and upper cervical spine: No focal worrisome marrow lesion. Sinuses/Orbits: No mass or acute finding within the imaged orbits. Prior left ocular lens replacement. Moderate right ethmoid sinusitis. 20 mm mucous retention cyst, and minimal background mucosal thickening, within the left maxillary sinus. Mild bilateral  ethmoid sinusitis. Mild mucosal thickening within the right frontal sinus. Severe left frontal sinusitis (with complete sinus opacification). IMPRESSION: 1. 4.3 x 4.8 cm peripherally enhancing and centrally necrotic mass at the junction of the frontal and parietal lobes on the left, as described. Moderate  surrounding vasogenic edema. Local mass effect with partial effacement of the left lateral ventricle. No midline shift. 2. Additional 5 mm enhancing lesion at the junction of the posterior hippocampus and crus of fornix on the left. 3. The above described lesions are most suspicious for intracranial metastases. However, a multicentric high-grade primary CNS neoplasm (such as glioblastoma multiforme) could also have this imaging presentation. 4. Several nonspecific chronic microhemorrhages scattered within the supratentorial and infratentorial brain. 5. Background parenchymal atrophy and chronic small vessel ischemic disease. 6. Paranasal sinus disease as described. Electronically Signed   By: Rockey Childs D.O.   On: 08/11/2023 14:24       IMPRESSION/PLAN: 1. 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 Dr. Dewey discusses the pathology findings and reviews the nature of metastatic lung cancer. He needs a PET scan and we discussed the rationale for this. Results could also impact options of more definitive lung treatment if he does not have bulky or diffuse disease. He is aware of the need to meet with medical oncology and we will coordinate an appointment to meet with Dr. Sherrod. I'll reach out to the thoracic navigator as well for molecular studies to be ordered if they haven't bene already. Regarding postoperative SRS, we reviewed the rationale to reduce risks of local recurrence following his surgical resection.  We discussed the risks, benefits, short, and long term effects of radiotherapy, as well as the definitive though overall palliative intent, and the patient is interested in proceeding. We also discussed the rationale for a repeat MRI brain with 3T technique which is scheduled next week. Dr. Dewey discusses the delivery and logistics of radiotherapy and anticipates a course of 3-5 fractions of SRS treatment. He will come for simulation on Tuesday 09/16/23, and sign  written consent to proceed. We plan to begin his treatment on 09/19/23.     This encounter was conducted via telephone.  The patient has provided two factor identification and has given verbal consent for this type of encounter and has been advised to only accept a meeting of this type in a secure network environment. The time spent during this encounter was 45 minutes including preparation, discussion, and coordination of the patient's care. The attendants for this meeting include Sharene Cary, RN, Donald Estefana Husband  and Macklin C Malicki with his daughter Shari Moscoso.  During the encounter,  Sharene Cary, RN and Donald Estefana Husband were located at [facility] Radiation On[facility] Department.  Sol C Blick was located at home with his daughter Shari Frankie.      Donald KYM Husband, [facility]   **Disclaimer: This note was dictated with voice recognition software. Similar sounding words can inadvertently be transcribed and this note may contain transcription errors which may not have been corrected upon publication of note.**

## 2023-09-11 ENCOUNTER — Encounter (HOSPITAL_COMMUNITY)
Admission: RE | Admit: 2023-09-11 | Discharge: 2023-09-11 | Disposition: A | Discharge status: Home / Self Care | Source: Ambulatory Visit | Attending: Radiation Oncology | Admitting: Radiation Oncology

## 2023-09-11 DIAGNOSIS — R911 Solitary pulmonary nodule: Secondary | ICD-10-CM | POA: Diagnosis not present

## 2023-09-11 DIAGNOSIS — F1010 Alcohol abuse, uncomplicated: Secondary | ICD-10-CM | POA: Diagnosis not present

## 2023-09-11 DIAGNOSIS — C7931 Secondary malignant neoplasm of brain: Secondary | ICD-10-CM | POA: Diagnosis not present

## 2023-09-11 DIAGNOSIS — Z87891 Personal history of nicotine dependence: Secondary | ICD-10-CM | POA: Diagnosis not present

## 2023-09-11 DIAGNOSIS — R32 Unspecified urinary incontinence: Secondary | ICD-10-CM | POA: Diagnosis not present

## 2023-09-11 DIAGNOSIS — Z483 Aftercare following surgery for neoplasm: Secondary | ICD-10-CM | POA: Diagnosis not present

## 2023-09-11 DIAGNOSIS — I10 Essential (primary) hypertension: Secondary | ICD-10-CM | POA: Diagnosis not present

## 2023-09-11 DIAGNOSIS — D45 Polycythemia vera: Secondary | ICD-10-CM | POA: Diagnosis not present

## 2023-09-11 DIAGNOSIS — E039 Hypothyroidism, unspecified: Secondary | ICD-10-CM | POA: Diagnosis not present

## 2023-09-11 DIAGNOSIS — K219 Gastro-esophageal reflux disease without esophagitis: Secondary | ICD-10-CM | POA: Diagnosis not present

## 2023-09-11 DIAGNOSIS — C771 Secondary and unspecified malignant neoplasm of intrathoracic lymph nodes: Secondary | ICD-10-CM | POA: Diagnosis not present

## 2023-09-11 DIAGNOSIS — E785 Hyperlipidemia, unspecified: Secondary | ICD-10-CM | POA: Diagnosis not present

## 2023-09-11 LAB — GLUCOSE, CAPILLARY: Glucose-Capillary: 106 mg/dL — ABNORMAL HIGH (ref 70–99)

## 2023-09-11 MED ORDER — FLUDEOXYGLUCOSE F - 18 (FDG) INJECTION
7.6500 | Freq: Once | INTRAVENOUS | Status: AC
  Administered 2023-09-11: 7.62 via INTRAVENOUS

## 2023-09-11 NOTE — Progress Notes (Signed)
 Request for molecular studies on tissue from recent brain tumor resection be sent to [facility] emailed to [employer] and Tonya Pendell, [facility] path technicians

## 2023-09-12 NOTE — Progress Notes (Signed)
 Has armband been applied?  Yes  Does patient have an allergy to IV contrast dye?: No   Has patient ever received premedication for IV contrast dye?:  n/a  Does patient take metformin?: No  If patient does take metformin when was the last dose: n/a  Date of lab work: 08/22/2023 BUN: 12 CR: 0.70 eGfr: >60  IV site: Right AC  Has IV site been added to flowsheet?  Yes  BP 112/79 (BP Location: Left Arm, Patient Position: Sitting, Cuff Size: Large)   Pulse 91   Temp 97.8 F (36.6 C)   Resp 20   Ht 5' 10 (1.778 m)   Wt 153 lb 12.8 oz (69.8 kg)   SpO2 98%   BMI 22.07 kg/m

## 2023-09-16 ENCOUNTER — Ambulatory Visit (HOSPITAL_COMMUNITY)
Admission: RE | Admit: 2023-09-16 | Discharge: 2023-09-16 | Disposition: A | Discharge status: Home / Self Care | Source: Ambulatory Visit | Attending: Radiation Oncology | Admitting: Radiation Oncology

## 2023-09-16 ENCOUNTER — Ambulatory Visit
Admission: RE | Admit: 2023-09-16 | Discharge: 2023-09-16 | Disposition: A | Discharge status: Home / Self Care | Source: Ambulatory Visit | Attending: Radiation Oncology | Admitting: Radiation Oncology

## 2023-09-16 VITALS — BP 112/79 | HR 91 | Temp 97.8°F | Resp 20 | Ht 70.0 in | Wt 153.8 lb

## 2023-09-16 DIAGNOSIS — I6782 Cerebral ischemia: Secondary | ICD-10-CM | POA: Diagnosis not present

## 2023-09-16 DIAGNOSIS — C801 Malignant (primary) neoplasm, unspecified: Secondary | ICD-10-CM | POA: Diagnosis not present

## 2023-09-16 DIAGNOSIS — C7931 Secondary malignant neoplasm of brain: Secondary | ICD-10-CM | POA: Insufficient documentation

## 2023-09-16 DIAGNOSIS — C3431 Malignant neoplasm of lower lobe, right bronchus or lung: Secondary | ICD-10-CM | POA: Insufficient documentation

## 2023-09-16 DIAGNOSIS — Z510 Encounter for antineoplastic radiation therapy: Secondary | ICD-10-CM | POA: Diagnosis not present

## 2023-09-16 DIAGNOSIS — C718 Malignant neoplasm of overlapping sites of brain: Secondary | ICD-10-CM | POA: Diagnosis not present

## 2023-09-16 DIAGNOSIS — R531 Weakness: Secondary | ICD-10-CM | POA: Diagnosis not present

## 2023-09-16 DIAGNOSIS — J329 Chronic sinusitis, unspecified: Secondary | ICD-10-CM | POA: Diagnosis not present

## 2023-09-16 MED ORDER — SODIUM CHLORIDE 0.9% FLUSH
10.0000 mL | Freq: Once | INTRAVENOUS | Status: AC
  Administered 2023-09-16: 10 mL via INTRAVENOUS

## 2023-09-16 MED ORDER — GADOBUTROL 1 MMOL/ML IV SOLN
7.0000 mL | Freq: Once | INTRAVENOUS | Status: AC | PRN
  Administered 2023-09-16: 7 mL via INTRAVENOUS

## 2023-09-16 NOTE — Addendum Note (Signed)
 Encounter addended by: Alizandra Loh M, RN on: 09/16/2023 9:46 AM  Actions taken: Order list changed, Diagnosis association updated, MAR administration accepted

## 2023-09-17 ENCOUNTER — Encounter: Payer: Self-pay | Admitting: Radiation Oncology

## 2023-09-17 ENCOUNTER — Encounter (HOSPITAL_COMMUNITY)

## 2023-09-17 ENCOUNTER — Ambulatory Visit

## 2023-09-17 DIAGNOSIS — D45 Polycythemia vera: Secondary | ICD-10-CM | POA: Diagnosis not present

## 2023-09-17 DIAGNOSIS — Z483 Aftercare following surgery for neoplasm: Secondary | ICD-10-CM | POA: Diagnosis not present

## 2023-09-17 DIAGNOSIS — K219 Gastro-esophageal reflux disease without esophagitis: Secondary | ICD-10-CM | POA: Diagnosis not present

## 2023-09-17 DIAGNOSIS — R911 Solitary pulmonary nodule: Secondary | ICD-10-CM | POA: Diagnosis not present

## 2023-09-17 DIAGNOSIS — I10 Essential (primary) hypertension: Secondary | ICD-10-CM | POA: Diagnosis not present

## 2023-09-17 DIAGNOSIS — R32 Unspecified urinary incontinence: Secondary | ICD-10-CM | POA: Diagnosis not present

## 2023-09-17 DIAGNOSIS — E039 Hypothyroidism, unspecified: Secondary | ICD-10-CM | POA: Diagnosis not present

## 2023-09-17 DIAGNOSIS — F1010 Alcohol abuse, uncomplicated: Secondary | ICD-10-CM | POA: Diagnosis not present

## 2023-09-17 DIAGNOSIS — E785 Hyperlipidemia, unspecified: Secondary | ICD-10-CM | POA: Diagnosis not present

## 2023-09-17 DIAGNOSIS — Z87891 Personal history of nicotine dependence: Secondary | ICD-10-CM | POA: Diagnosis not present

## 2023-09-18 ENCOUNTER — Other Ambulatory Visit: Payer: Self-pay | Admitting: Radiation Therapy

## 2023-09-19 ENCOUNTER — Ambulatory Visit
Admission: RE | Admit: 2023-09-19 | Discharge: 2023-09-19 | Disposition: A | Discharge status: Home / Self Care | Source: Ambulatory Visit | Attending: Radiation Oncology | Admitting: Radiation Oncology

## 2023-09-19 ENCOUNTER — Other Ambulatory Visit: Payer: Self-pay

## 2023-09-19 DIAGNOSIS — F1010 Alcohol abuse, uncomplicated: Secondary | ICD-10-CM | POA: Diagnosis not present

## 2023-09-19 DIAGNOSIS — E785 Hyperlipidemia, unspecified: Secondary | ICD-10-CM | POA: Diagnosis not present

## 2023-09-19 DIAGNOSIS — Z87891 Personal history of nicotine dependence: Secondary | ICD-10-CM | POA: Diagnosis not present

## 2023-09-19 DIAGNOSIS — E039 Hypothyroidism, unspecified: Secondary | ICD-10-CM | POA: Diagnosis not present

## 2023-09-19 DIAGNOSIS — C3431 Malignant neoplasm of lower lobe, right bronchus or lung: Secondary | ICD-10-CM | POA: Diagnosis not present

## 2023-09-19 DIAGNOSIS — R32 Unspecified urinary incontinence: Secondary | ICD-10-CM | POA: Diagnosis not present

## 2023-09-19 DIAGNOSIS — Z483 Aftercare following surgery for neoplasm: Secondary | ICD-10-CM | POA: Diagnosis not present

## 2023-09-19 DIAGNOSIS — I10 Essential (primary) hypertension: Secondary | ICD-10-CM | POA: Diagnosis not present

## 2023-09-19 DIAGNOSIS — Z510 Encounter for antineoplastic radiation therapy: Secondary | ICD-10-CM | POA: Diagnosis not present

## 2023-09-19 DIAGNOSIS — D45 Polycythemia vera: Secondary | ICD-10-CM | POA: Diagnosis not present

## 2023-09-19 DIAGNOSIS — R911 Solitary pulmonary nodule: Secondary | ICD-10-CM | POA: Diagnosis not present

## 2023-09-19 DIAGNOSIS — K219 Gastro-esophageal reflux disease without esophagitis: Secondary | ICD-10-CM | POA: Diagnosis not present

## 2023-09-19 DIAGNOSIS — C718 Malignant neoplasm of overlapping sites of brain: Secondary | ICD-10-CM | POA: Diagnosis not present

## 2023-09-19 DIAGNOSIS — C7931 Secondary malignant neoplasm of brain: Secondary | ICD-10-CM | POA: Diagnosis not present

## 2023-09-19 LAB — RAD ONC ARIA SESSION SUMMARY
Course Elapsed Days: 0
Plan Fractions Treated to Date: 1
Plan Prescribed Dose Per Fraction: 9 Gy
Plan Total Fractions Prescribed: 3
Plan Total Prescribed Dose: 27 Gy
Reference Point Dosage Given to Date: 9 Gy
Reference Point Session Dosage Given: 9 Gy
Session Number: 1

## 2023-09-19 NOTE — Progress Notes (Signed)
 Patient rested with us  for 15 minutes following his SRS treatment.  Patient denies headache, dizziness, nausea, diplopia or ringing in the ears. Denies fatigue. Patient without complaints. Understands to avoid strenuous activity for the next 24 hours and call [phone] with needs.   BP 105/75 (BP Location: Left Arm)   Pulse (!) 102   Temp 98.3 F (36.8 C) (Oral)   Resp 16   SpO2 98%

## 2023-09-22 ENCOUNTER — Encounter (HOSPITAL_COMMUNITY): Payer: Self-pay | Admitting: Internal Medicine

## 2023-09-22 ENCOUNTER — Ambulatory Visit: Admitting: Internal Medicine

## 2023-09-22 ENCOUNTER — Ambulatory Visit
Admission: RE | Admit: 2023-09-22 | Discharge: 2023-09-22 | Disposition: A | Discharge status: Home / Self Care | Source: Ambulatory Visit | Attending: Radiation Oncology | Admitting: Radiation Oncology

## 2023-09-22 ENCOUNTER — Encounter: Payer: Self-pay | Admitting: Genetic Counselor

## 2023-09-22 ENCOUNTER — Other Ambulatory Visit

## 2023-09-22 ENCOUNTER — Inpatient Hospital Stay: Attending: Radiation Oncology

## 2023-09-22 ENCOUNTER — Other Ambulatory Visit: Payer: Self-pay

## 2023-09-22 DIAGNOSIS — Z5111 Encounter for antineoplastic chemotherapy: Secondary | ICD-10-CM | POA: Insufficient documentation

## 2023-09-22 DIAGNOSIS — Z79899 Other long term (current) drug therapy: Secondary | ICD-10-CM | POA: Insufficient documentation

## 2023-09-22 DIAGNOSIS — C3431 Malignant neoplasm of lower lobe, right bronchus or lung: Secondary | ICD-10-CM | POA: Insufficient documentation

## 2023-09-22 DIAGNOSIS — Z87891 Personal history of nicotine dependence: Secondary | ICD-10-CM | POA: Insufficient documentation

## 2023-09-22 DIAGNOSIS — C7931 Secondary malignant neoplasm of brain: Secondary | ICD-10-CM | POA: Insufficient documentation

## 2023-09-22 DIAGNOSIS — Z510 Encounter for antineoplastic radiation therapy: Secondary | ICD-10-CM | POA: Diagnosis not present

## 2023-09-22 LAB — RAD ONC ARIA SESSION SUMMARY
Course Elapsed Days: 3
Plan Fractions Treated to Date: 2
Plan Prescribed Dose Per Fraction: 9 Gy
Plan Total Fractions Prescribed: 3
Plan Total Prescribed Dose: 27 Gy
Reference Point Dosage Given to Date: 18 Gy
Reference Point Session Dosage Given: 9 Gy
Session Number: 2

## 2023-09-22 NOTE — Progress Notes (Signed)
 Patient rested with us  for 15 minutes following his SRS treatment.  Patient denies headache, dizziness, nausea, diplopia or ringing in the ears. Denies fatigue. Patient without complaints. Understands to avoid strenuous activity for the next 24 hours and call [phone] with needs.     BP 122/81   Pulse (!) 103   Temp (!) 97.3 F (36.3 C)   Resp 18   SpO2 98%

## 2023-09-23 ENCOUNTER — Encounter (HOSPITAL_COMMUNITY): Payer: Self-pay | Admitting: Internal Medicine

## 2023-09-23 DIAGNOSIS — F1010 Alcohol abuse, uncomplicated: Secondary | ICD-10-CM | POA: Diagnosis not present

## 2023-09-23 DIAGNOSIS — D45 Polycythemia vera: Secondary | ICD-10-CM | POA: Diagnosis not present

## 2023-09-23 DIAGNOSIS — I10 Essential (primary) hypertension: Secondary | ICD-10-CM | POA: Diagnosis not present

## 2023-09-23 DIAGNOSIS — E039 Hypothyroidism, unspecified: Secondary | ICD-10-CM | POA: Diagnosis not present

## 2023-09-23 DIAGNOSIS — E785 Hyperlipidemia, unspecified: Secondary | ICD-10-CM | POA: Diagnosis not present

## 2023-09-23 DIAGNOSIS — Z87891 Personal history of nicotine dependence: Secondary | ICD-10-CM | POA: Diagnosis not present

## 2023-09-23 DIAGNOSIS — Z483 Aftercare following surgery for neoplasm: Secondary | ICD-10-CM | POA: Diagnosis not present

## 2023-09-23 DIAGNOSIS — R32 Unspecified urinary incontinence: Secondary | ICD-10-CM | POA: Diagnosis not present

## 2023-09-23 DIAGNOSIS — R911 Solitary pulmonary nodule: Secondary | ICD-10-CM | POA: Diagnosis not present

## 2023-09-23 DIAGNOSIS — K219 Gastro-esophageal reflux disease without esophagitis: Secondary | ICD-10-CM | POA: Diagnosis not present

## 2023-09-23 DIAGNOSIS — C3431 Malignant neoplasm of lower lobe, right bronchus or lung: Secondary | ICD-10-CM | POA: Diagnosis not present

## 2023-09-24 ENCOUNTER — Ambulatory Visit
Admission: RE | Admit: 2023-09-24 | Discharge: 2023-09-24 | Disposition: A | Discharge status: Home / Self Care | Source: Ambulatory Visit | Attending: Radiation Oncology | Admitting: Radiation Oncology

## 2023-09-24 ENCOUNTER — Other Ambulatory Visit: Payer: Self-pay

## 2023-09-24 DIAGNOSIS — C718 Malignant neoplasm of overlapping sites of brain: Secondary | ICD-10-CM | POA: Diagnosis not present

## 2023-09-24 DIAGNOSIS — C3431 Malignant neoplasm of lower lobe, right bronchus or lung: Secondary | ICD-10-CM

## 2023-09-24 DIAGNOSIS — Z510 Encounter for antineoplastic radiation therapy: Secondary | ICD-10-CM | POA: Diagnosis not present

## 2023-09-24 LAB — RAD ONC ARIA SESSION SUMMARY
Course Elapsed Days: 5
Plan Fractions Treated to Date: 3
Plan Prescribed Dose Per Fraction: 9 Gy
Plan Total Fractions Prescribed: 3
Plan Total Prescribed Dose: 27 Gy
Reference Point Dosage Given to Date: 27 Gy
Reference Point Session Dosage Given: 6.8154 Gy
Session Number: 3

## 2023-09-24 NOTE — Progress Notes (Signed)
 Patient rested with us  for 15 minutes following his SRS treatment.  Patient denies headache, dizziness, nausea, diplopia or ringing in the ears. Denies fatigue. Patient without complaints. Understands to avoid strenuous activity for the next 24 hours and call [phone] with needs.     BP 121/84 (BP Location: Left Arm, Patient Position: Sitting, Cuff Size: Large)   Pulse (!) 107   Temp 97.6 F (36.4 C)   Resp 20   SpO2 98%

## 2023-09-25 ENCOUNTER — Inpatient Hospital Stay

## 2023-09-25 ENCOUNTER — Other Ambulatory Visit: Payer: Self-pay | Admitting: Radiation Therapy

## 2023-09-25 ENCOUNTER — Inpatient Hospital Stay (HOSPITAL_BASED_OUTPATIENT_CLINIC_OR_DEPARTMENT_OTHER): Admitting: Internal Medicine

## 2023-09-25 VITALS — BP 131/78 | Temp 97.9°F | Resp 17 | Ht 70.0 in | Wt 156.7 lb

## 2023-09-25 DIAGNOSIS — C7931 Secondary malignant neoplasm of brain: Secondary | ICD-10-CM

## 2023-09-25 DIAGNOSIS — Z79899 Other long term (current) drug therapy: Secondary | ICD-10-CM | POA: Diagnosis not present

## 2023-09-25 DIAGNOSIS — Z87891 Personal history of nicotine dependence: Secondary | ICD-10-CM | POA: Diagnosis not present

## 2023-09-25 DIAGNOSIS — C3431 Malignant neoplasm of lower lobe, right bronchus or lung: Secondary | ICD-10-CM | POA: Diagnosis not present

## 2023-09-25 DIAGNOSIS — N4289 Other specified disorders of prostate: Secondary | ICD-10-CM | POA: Diagnosis not present

## 2023-09-25 DIAGNOSIS — Z5111 Encounter for antineoplastic chemotherapy: Secondary | ICD-10-CM | POA: Diagnosis not present

## 2023-09-25 LAB — CMP (CANCER CENTER ONLY)
ALT: 18 U/L (ref 0–44)
AST: 16 U/L (ref 15–41)
Albumin: 4 g/dL (ref 3.5–5.0)
Alkaline Phosphatase: 94 U/L (ref 38–126)
Anion gap: 5 (ref 5–15)
BUN: 10 mg/dL (ref 8–23)
CO2: 28 mmol/L (ref 22–32)
Calcium: 9.2 mg/dL (ref 8.9–10.3)
Chloride: 105 mmol/L (ref 98–111)
Creatinine: 0.88 mg/dL (ref 0.61–1.24)
GFR, Estimated: >60 mL/min (ref >60)
Glucose, Bld: 92 mg/dL (ref 70–99)
Potassium: 3.9 mmol/L (ref 3.5–5.1)
Sodium: 138 mmol/L (ref 135–145)
Total Bilirubin: 0.9 mg/dL (ref 0.0–1.2)
Total Protein: 6.8 g/dL (ref 6.5–8.1)

## 2023-09-25 LAB — CBC WITH DIFFERENTIAL (CANCER CENTER ONLY)
Abs Immature Granulocytes: 0.05 K/uL (ref 0.00–0.07)
Basophils Absolute: 0.1 K/uL (ref 0.0–0.1)
Basophils Relative: 1 %
Eosinophils Absolute: 0.2 K/uL (ref 0.0–0.5)
Eosinophils Relative: 3 %
HCT: 39 % (ref 39.0–52.0)
Hemoglobin: 13.1 g/dL (ref 13.0–17.0)
Immature Granulocytes: 1 %
Lymphocytes Relative: 20 %
Lymphs Abs: 1.3 K/uL (ref 0.7–4.0)
MCH: 27.6 pg (ref 26.0–34.0)
MCHC: 33.6 g/dL (ref 30.0–36.0)
MCV: 82.1 fL (ref 80.0–100.0)
Monocytes Absolute: 0.7 K/uL (ref 0.1–1.0)
Monocytes Relative: 11 %
Neutro Abs: 4.2 K/uL (ref 1.7–7.7)
Neutrophils Relative %: 64 %
Platelet Count: 214 K/uL (ref 150–400)
RBC: 4.75 MIL/uL (ref 4.22–5.81)
RDW: 16.1 % — ABNORMAL HIGH (ref 11.5–15.5)
WBC Count: 6.6 K/uL (ref 4.0–10.5)
nRBC: 0 % (ref 0.0–0.2)

## 2023-09-25 MED ORDER — LIDOCAINE-PRILOCAINE 2.5-2.5 % EX CREA: TOPICAL_CREAM | CUTANEOUS | 3 refills | Status: DC

## 2023-09-25 MED ORDER — ONDANSETRON HCL 8 MG PO TABS: 8.0000 mg | ORAL_TABLET | Freq: Three times a day (TID) | ORAL | 1 refills | Status: DC | PRN

## 2023-09-25 MED ORDER — PROCHLORPERAZINE MALEATE 10 MG PO TABS: 10.0000 mg | ORAL_TABLET | Freq: Four times a day (QID) | ORAL | 1 refills | Status: DC | PRN

## 2023-09-25 NOTE — Progress Notes (Signed)
 START OFF PATHWAY REGIMEN - Non-Small Cell Lung   [id number]:Carboplatin AUC=2 IV D1 + Paclitaxel 45 mg/m2 IV D1 q7 Days + RT:   A cycle is every 7 days, concurrent with RT:     Paclitaxel      Carboplatin   **Always confirm dose/schedule in your pharmacy ordering system**  Patient Characteristics: Stage IV Metastatic, Nonsquamous, Molecular Analysis Completed, Molecular Alteration Present and Targeted Therapy Exhausted OR KRAS G12C+ or HER2+ or NRG1+ or c-Met Present and No Prior Chemo/Immunotherapy OR No Alteration Present, Initial  Chemotherapy/Immunotherapy, PS = 0, 1, No Alteration Present, No Alteration Present, Candidate for Immunotherapy, PD-L1 Expression Positive 1-49% (TPS) / Negative / Not Tested / Awaiting Test Results and Immunotherapy Candidate Therapeutic Status: Stage IV Metastatic Histology: Nonsquamous Cell Broad Molecular Profiling Status: Molecular Analysis Completed Molecular Analysis Results: No Alteration Present ECOG Performance Status: 1 Chemotherapy/Immunotherapy Line of Therapy: Initial Chemotherapy/Immunotherapy EGFR Exons 18-21 Mutation Testing Status: Completed and Negative c-Met Overexpression (EGFR Wildtype) Testing Status: Completed and Negative ALK Fusion/Rearrangement Testing Status: Completed and Negative BRAF V600 Mutation Testing Status: Completed and Negative KRAS G12C Mutation Testing Status: Completed and Negative MET Exon 14 Mutation Testing Status: Completed and Negative RET Fusion/Rearrangement Testing Status: Completed and Negative NRG1 Fusion/Rearrangement Testing Status: Completed and Negative HER2 Mutation Testing Status: Completed and Negative NTRK Fusion/Rearrangement Testing Status: Completed and Negative ROS1 Fusion/Rearrangement Testing Status: Completed and Negative Immunotherapy Candidate Status: Candidate for Immunotherapy PD-L1 Expression Status: PD-L1 Negative Intent of Therapy: Non-Curative / Palliative Intent, Discussed with  Patient

## 2023-09-25 NOTE — Progress Notes (Signed)
 [facility] Telephone:([phone]) [phone]   [phone]  CONSULT NOTE  REFERRING PHYSICIAN: Dr. Lamar Chris  REASON FOR CONSULTATION:  78 years old white male recently diagnosed with lung cancer  HPI Noah King is a 78 y.o. male with past medical history significant for hypertension, polycythemia, hiatal hernia, allergy and acid reflux.  He also has a long history of smoking but quit in 1997. HPI  Discussed the use of AI scribe software for clinical note transcription with the patient, who gave verbal consent to proceed.  History of Present Illness Noah King is a 78 year old male with lung cancer who presents for a follow-up visit after recent brain surgery and radiation treatment. He is accompanied by his daughter, Noah King.  He was recently diagnosed with lung cancer after experiencing balance issues for years, which worsened to the point of requiring a walker. On August 11, 2023, he was hospitalized due to significant difficulty maneuvering, and an MRI revealed a 4.3 x 4.8 cm brain tumor, later identified as cancerous. A CT scan of the chest, abdomen, and pelvis showed small lung nodules.  During his hospitalization, he was started on steroids to manage brain swelling. A bronchoscopy on July 3 was inconclusive for cancer. He underwent a left craniotomy on August 7 to resect the brain tumor, followed by three sessions of stereotactic radiosurgery [facility]), with the last radiation treatment completed yesterday. A PET scan on August 28 showed small nodules in the right upper lobe of the lung and activity in the prostate, though he has no history of prostate cancer.  The pathology from the brain tumor resection showed adenocarcinoma, and the patient was told it was related to the lung. He was informed that no molecular markers were found that would allow for oral targeted therapy. He feels 'a little tired' but has no pain, chest pain, shortness of breath, or  worsening of a chronic dry cough. He has experienced weight loss, from 175 lbs in December to 153 lbs recently.  His past medical history includes high blood pressure, acid reflux, a suspected case of polycythemia vera, hiatal hernia, and hypothyroidism. He has no history of diabetes, heart attacks, or strokes. His family history includes a mother with a stroke, a father with colon cancer at age 105, and a sister with breast cancer.  Socially, he worked in [employer] and smoked from age 35 to 25, quitting approximately 38 years ago. He drinks beer daily, ranging from two to six beers, and has no history of drug abuse. He is not married and has one daughter, Noah King.     Past Medical History:  Diagnosis Date   Allergy    Cataract    left  extraction- right eye forming   Dupuytren's contracture    GERD (gastroesophageal reflux disease)    Hiatal hernia    past hx-  suspect at one time   Hypertension    Polycythemia       Past Surgical History:  Procedure Laterality Date   APPLICATION OF CRANIAL NAVIGATION Left 08/21/2023   Procedure: COMPUTER-ASSISTED NAVIGATION, FOR CRANIAL PROCEDURE;  Surgeon: Debby Dorn MATSU, MD;  Location: [facility] OR;  Service: Neurosurgery;  Laterality: Left;   cataract extraction left eye     COLONOSCOPY     CRANIOTOMY Left 08/21/2023   Procedure: CRANIOTOMY TUMOR EXCISION;  Surgeon: Debby Dorn MATSU, MD;  Location[facility] OR;  Service: Neurosurgery;  Laterality: Left;  LT CRANI FOR TUMOR RESECTION  DUPUYTREN CONTRACTURE RELEASE Left    HERNIA REPAIR  2024   POLYPECTOMY     rt knee arthroscopic     VIDEO BRONCHOSCOPY WITH ENDOBRONCHIAL ULTRASOUND Right 08/13/2023   Procedure: BRONCHOSCOPY, WITH EBUS;  Surgeon: Jude Harden GAILS, MD;  Location: [facility] ENDOSCOPY;  Service: Cardiopulmonary;  Laterality: Right;    Family History  Problem Relation Age of Onset   Stroke Mother    Colon cancer Father 49       d. 44   Breast cancer Sister    Colon polyps Neg  Hx    Esophageal cancer Neg Hx     Social History Social History   Tobacco Use   Smoking status: Former    Current packs/day: 0.00    Types: Cigarettes    Quit date: 01/14/1985    Years since quitting: 38.7   Smokeless tobacco: Never  Vaping Use   Vaping status: Never Used  Substance Use Topics   Alcohol use: Yes    Alcohol/week: 20.0 standard drinks of alcohol    Types: 20 Cans of beer per week   Drug use: No    Allergies  Allergen Reactions   Lotensin  [Benazepril  Hcl] Cough    Current Outpatient Medications  Medication Sig Dispense Refill   atorvastatin  (LIPITOR) 20 MG tablet Take 1 tablet (20 mg total) by mouth daily. 90 tablet 3   docusate sodium  (COLACE) 100 MG capsule Take 100 mg by mouth daily as needed (constipation).     folic acid  (FOLVITE ) 1 MG tablet Take 1 tablet (1 mg total) by mouth daily. 30 tablet 0   losartan  (COZAAR ) 25 MG tablet Take 1 tablet (25 mg total) by mouth daily. 90 tablet 3   Multiple Vitamins-Minerals (MENS 50+ MULTIVITAMIN) TABS Take 1 tablet by mouth daily.     pantoprazole  (PROTONIX ) 40 MG tablet Take 1 tablet (40 mg total) by mouth daily. 90 tablet 1   polyethylene glycol powder (GLYCOLAX /MIRALAX ) 17 GM/SCOOP powder Take 17 g by mouth daily as needed for mild constipation. 238 g 0   thiamine  (VITAMIN B1) 100 MG tablet Take 1 tablet (100 mg total) by mouth daily. 30 tablet 0   No current facility-administered medications for this visit.    Review of Systems  Constitutional: positive for fatigue and weight loss Eyes: negative Ears, nose, mouth, throat, and face: negative Respiratory: negative Cardiovascular: negative Gastrointestinal: negative Genitourinary:negative Integument/breast: negative Hematologic/lymphatic: negative Musculoskeletal:positive for muscle weakness Neurological: negative Behavioral/Psych: negative Endocrine: negative Allergic/Immunologic: negative  Physical Exam  MJO:jozmu, healthy, no distress, well  nourished, and well developed SKIN: skin color, texture, turgor are normal, no rashes or significant lesions HEAD: Normocephalic, No masses, lesions, tenderness or abnormalities EYES: normal, PERRLA EARS: External ears normal, Canals clear OROPHARYNX:no exudate, no erythema, and lips, buccal mucosa, and tongue normal  NECK: supple, no adenopathy, no JVD LYMPH:  no palpable lymphadenopathy, no hepatosplenomegaly LUNGS: clear to auscultation , and palpation HEART: regular rate & rhythm, no murmurs, and no gallops ABDOMEN:abdomen soft, non-tender, normal bowel sounds, and no masses or organomegaly BACK: Back symmetric, no curvature., No CVA tenderness EXTREMITIES:no joint deformities, effusion, or inflammation, no edema  NEURO: alert & oriented x 3 with fluent speech, no focal motor/sensory deficits  PERFORMANCE STATUS: ECOG 1  LABORATORY DATA: Lab Results  Component Value Date   WBC 6.6 09/25/2023   HGB 13.1 09/25/2023   HCT 39.0 09/25/2023   MCV 82.1 09/25/2023   PLT 214 09/25/2023      Chemistry  Component Value Date/Time   NA 138 08/22/2023 0310   K 3.8 08/22/2023 0310   CL 110 08/22/2023 0310   CO2 20 (L) 08/22/2023 0310   BUN 12 08/22/2023 0310   CREATININE 0.70 08/22/2023 0310   CREATININE 0.78 12/08/2019 0819      Component Value Date/Time   CALCIUM  8.2 (L) 08/22/2023 0310   ALKPHOS 37 (L) 08/22/2023 0310   AST 20 08/22/2023 0310   ALT 39 08/22/2023 0310   BILITOT 0.5 08/22/2023 0310       RADIOGRAPHIC STUDIES: MR Brain W Wo Contrast Result Date: 09/16/2023 CLINICAL DATA:  Provided history: Metastasis to brain. Metastatic disease evaluation. Postoperative treatment planning. EXAM: MRI HEAD WITHOUT AND WITH CONTRAST TECHNIQUE: Multiplanar, multiecho pulse sequences of the brain and surrounding structures were obtained without and with intravenous contrast. CONTRAST:  7mL GADAVIST  GADOBUTROL  1 MMOL/ML IV SOLN COMPARISON:  Prior brain MRI examinations 08/22/2023  and earlier. FINDINGS: Brain: Generalized cerebral atrophy. There has been interval partial retraction of the left parietal lobe resection cavity. Enhancement at the periphery of the resection cavity has increased in extent, but remains non-nodular. Only minimal residual edema surrounding the resection cavity. Mass effect upon the left lateral ventricle has resolved. Chronic blood products at the resection site. Decreased size of an enhancing lesion at the posterior aspect of the left hippocampus, now punctate (previously 4 mm) (series 1100, image 170). No new enhancing intracranial lesions are identified. Mild multifocal T2 FLAIR hyperintense signal abnormality within the cerebral white matter, nonspecific but compatible with chronic small vessel ischemic disease. Unchanged chronic lacunar infarct within the right aspect of the pons. Several chronic microhemorrhages scattered elsewhere within the supratentorial and infratentorial brain, similar to the prior MRI. Smooth dural thickening and enhancement along the left cerebral convexity, likely postoperative. There is no acute infarct. No extra-axial fluid collection. No midline shift. Vascular: Maintained flow voids within the proximal large arterial vessels. Skull and upper cervical spine: Left parietal cranioplasty. No focal worrisome marrow lesion. Sinuses/Orbits: No mass or acute finding within the imaged orbits. Prior left ocular lens replacement. Moderate mucosal thickening within the right maxillary sinus. Small mucous retention cyst within the left maxillary sinus. Partial opacification of a posterior left ethmoid air cell. Complete opacification of the left frontal sinus. IMPRESSION: 1. Interval partial retraction of the left parietal lobe resection cavity. Enhancement at the periphery of the resection cavity has increased in extent, but remains non-nodular. Attention recommended on MRI follow-up. Only minimal residual edema surrounding the resection site.  2. Decreased size of an enhancing lesion at the posterior aspect of the left hippocampus, now punctate (previously 4 mm). 3. Background parenchymal atrophy and chronic small vessel ischemic disease. 4. Paranasal sinus disease as described. Electronically Signed   By: Rockey Childs D.O.   On: 09/16/2023 17:55   NM PET Image Initial (PI) Skull Base To Thigh Result Date: 09/16/2023 EXAM: PET AND CT SKULL BASE TO MID THIGH 09/11/2023 01:50:39 PM TECHNIQUE: PET imaging was acquired from the base of the skull to the mid thighs. Non-contrast enhanced computed tomography was obtained for attenuation correction and anatomic localization. RADIOPHARMACEUTICAL: 7.62 mCi F-18 FDG Uptake time 60 minutes. Glucose level 106 mg/dl. COMPARISON: CTs of the chest, abdomen, and pelvis of 08/11/2023. CLINICAL HISTORY: Non-small cell lung cancer (NSCLC), staging. PET scan for metastasis to brain. Tumor removal from brain 2-3 weeks ago. Lung and brain biopsies 2-3 weeks ago. No therapies or treatments. FINDINGS: HEAD AND NECK: No cervical nodal hypermetabolism. Right maxillary sinus  mucosal thickening and fluid level. CHEST: Right paratracheal node measures 8 mm and SUV 5.6 on 73/4 versus 9 mm on the prior diagnostic CT. Right hilar hypermetabolism is presumably nodal at SUV 4.5. 9 mm node in this region on the prior diagnostic CT. No pulmonary parenchymal hypermetabolism identified. Pulmonary nodules which are below PET resolution again identified. Lateral right lower lobe 6 x 6 mm nodule and 53/7 measured 7 x 7 mm previously. More posterior right lower lobe pulmonary nodule measures 6 mm and 50/7 versus 7 mm on the prior diagnostic CT. Medial left lower lobe 6 mm nodule and 44/7 measured 7 mm on the prior. Aortic atherosclerosis. Bovine arch. Coronary artery calcifications. ABDOMEN AND PELVIS: No abdominal or pelvic nodal hypermetabolism. Multifocal prostatic hypermetabolism. Example within the right lateral mid gland, estimated at 1.8  cm and SUV 14.2. Left-sided hypermetabolism, most focal at the apex measuring approximately 1.3 cm and SUV max 6.4. Bilateral low-density renal lesions are likely cysts. Ventral pelvic hernia repair. BONES AND SOFT TISSUE: No abnormal marrow activity. IMPRESSION: 1. Pulmonary and Thoracic nodal metastasis. Felt to be minimally improved since 08/11/23 CT. The thoracic nodal metastasis demonstrate hypermetabolism and the pulmonary metastases are below PET resolution. 2. No extrathoracic metastatic disease identified. 3. Multifocal prostatic hypermetabolism, suspicious 1 or more primary carcinomas. consider correlation with Psa Level and physical exam. 4. Incidental findings, including: Aortic atherosclerosis (icd10-i70.0). Coronary artery atherosclerosis. Sinus disease. Electronically signed by: Rockey Kilts MD 09/16/2023 04:19 PM EDT RP Workstation: [id number]    ASSESSMENT: This is a very pleasant 78 years old white male diagnosed with stage IV (T1a, N2b, M1b) non-small cell lung cancer, adenocarcinoma presented with right lower lobe lung nodule in addition to right hilar and mediastinal lymphadenopathy and metastatic brain lesions diagnosed in August 2025.  Status post left craniotomy with resection of the left frontoparietal brain tumor followed by [facility].  The patient also has smaller pulmonary nodules in the right and left lung. Molecular studies by foundation 1 showed no actionable mutations and negative PD-L1 expression.  PLAN: I had a lengthy discussion with the patient and his daughter today about his current disease stage, prognosis and treatment options. I personally independently reviewed the scan and images as well as the pathology report and discussed them with the patient and his daughter. Assessment and Plan Assessment & Plan Stage IV non-small cell lung cancer (adenocarcinoma) with brain metastasis (post-resection) and lymph node involvement The primary lung tumor is adenocarcinoma, non-small  cell type, with papillary features. The brain metastasis was resected, and he completed SRS treatment. The lung disease is now considered localized with small nodules and lymph node involvement. Molecular marker testing was negative, and PDL1 testing was also negative, precluding the use of targeted therapy or immunotherapy at this time. - Administer concurrent chemotherapy and radiation to the chest to address localized disease. - Administer chemotherapy once a week on Mondays for six and a half weeks. - Administer radiation therapy Monday through Friday for six and a half weeks. - Schedule a chemo class for patient education before starting treatment. - Consider placement of a port for chemotherapy administration. - Monitor blood counts, kidney, and liver function during treatment. - Provide premedication for nausea before chemotherapy and prescribe additional nausea medication for home use. - Plan a three-week break after completing chemo and radiation, followed by a CT scan to assess for disease spread. - Consider immunotherapy if the post-treatment scan shows no disease progression.  Unintentional weight loss Unintentional weight loss from 175  lbs to 153 lbs since last annual physical. - Encourage a high-protein diet and nutritional supplements to maintain weight. - Monitor weight regularly.  Possible prostate neoplasm (pending urology evaluation) Possible prostate neoplasm identified on PET scan with increased activity in the prostate. No prior history of prostate cancer. - Refer to urology for further evaluation of prostate findings. - Check PSA levels.  The patient voices understanding of current disease status and treatment options and is in agreement with the current care plan.  All questions were answered. The patient knows to call the clinic with any problems, questions or concerns. We can certainly see the patient much sooner if necessary.  Thank you so much for allowing me to  participate in the care of Noah King. I will continue to follow up the patient with you and assist in his care. The total time spent in the appointment was 90 minutes including review of chart and various tests results, discussions about plan of care and coordination of care plan .   Disclaimer: This note was dictated with voice recognition software. Similar sounding words can inadvertently be transcribed and may not be corrected upon review.   Noah King September 25, 2023, 2:08 PM

## 2023-09-25 NOTE — Progress Notes (Signed)
 NN met the pt face to face today at his medical oncology consult with Dr. Sherrod. Pt was accompanied by his dtr, Margit. Margit usually lives in [place], but is staying with the pt for the time being. Pt also has friends he spends time with locally.  Plan for the pt is concurrent chemoradiation for 6.5wks.  Port recommended, NN will reach out  Pt will need chemo education class which will be scheduled at conclusion of consult appt.  NN provided pt with Lung Cancer Journey Book and direct contact information.  NN escorted pt to scheduling to arrange Chemo education appt. Pt will be contacted by scheduling to go over treatment schedule once schedule has been made. Margit and pt verbalized understanding.

## 2023-09-25 NOTE — Radiation Completion Notes (Addendum)
  Radiation Oncology         [phone] ________________________________  Name: GUNNARD DORRANCE MRN: [id number]  Date of Service: 09/24/2023  DOB: 1945-10-08  End of Treatment Note  Diagnosis:  Stage IV, [id number], NSCLC, NOS of the RLL with solitary brain metastasis.   Intent: Palliative     ==========DELIVERED PLANS==========  First Treatment Date: 2023-09-19 Last Treatment Date: 2023-09-24   Plan Name: Brain_SRT Site: Brain POSTOP SRS [id number]  Technique: SBRT/SRT-IMRT Mode: Photon Dose Per Fraction: 9 Gy Prescribed Dose (Delivered / Prescribed): 27 Gy / 27 Gy Prescribed Fxs (Delivered / Prescribed): 3 / 3     ==========ON TREATMENT VISIT DATES========== 2023-09-19, 2023-09-22, 2023-09-24    See weekly On Treatment Notes in Epic for details in the Media tab (listed as Progress notes on the On Treatment Visit Dates listed above). The patient tolerated radiation. He will proceed with chemoradiation as well.   The patient will receive a call in about one month from the radiation oncology department. He will continue follow up with our plans for chemoradiation, then following in our brain oncology program with Dr. Buckley and with Dr. Sherrod as well.      Donald KYM Husband, PAC

## 2023-09-26 ENCOUNTER — Other Ambulatory Visit: Payer: Self-pay

## 2023-09-26 DIAGNOSIS — F1010 Alcohol abuse, uncomplicated: Secondary | ICD-10-CM | POA: Diagnosis not present

## 2023-09-26 DIAGNOSIS — K219 Gastro-esophageal reflux disease without esophagitis: Secondary | ICD-10-CM | POA: Diagnosis not present

## 2023-09-26 DIAGNOSIS — R911 Solitary pulmonary nodule: Secondary | ICD-10-CM | POA: Diagnosis not present

## 2023-09-26 DIAGNOSIS — D45 Polycythemia vera: Secondary | ICD-10-CM | POA: Diagnosis not present

## 2023-09-26 DIAGNOSIS — Z87891 Personal history of nicotine dependence: Secondary | ICD-10-CM | POA: Diagnosis not present

## 2023-09-26 DIAGNOSIS — I10 Essential (primary) hypertension: Secondary | ICD-10-CM | POA: Diagnosis not present

## 2023-09-26 DIAGNOSIS — E785 Hyperlipidemia, unspecified: Secondary | ICD-10-CM | POA: Diagnosis not present

## 2023-09-26 DIAGNOSIS — R32 Unspecified urinary incontinence: Secondary | ICD-10-CM | POA: Diagnosis not present

## 2023-09-26 DIAGNOSIS — E039 Hypothyroidism, unspecified: Secondary | ICD-10-CM | POA: Diagnosis not present

## 2023-09-26 DIAGNOSIS — C3431 Malignant neoplasm of lower lobe, right bronchus or lung: Secondary | ICD-10-CM

## 2023-09-26 DIAGNOSIS — Z483 Aftercare following surgery for neoplasm: Secondary | ICD-10-CM | POA: Diagnosis not present

## 2023-09-28 ENCOUNTER — Other Ambulatory Visit: Payer: Self-pay

## 2023-09-29 ENCOUNTER — Telehealth: Payer: Self-pay | Admitting: Radiation Oncology

## 2023-09-29 ENCOUNTER — Telehealth: Payer: Self-pay | Admitting: Internal Medicine

## 2023-09-29 NOTE — Op Note (Signed)
 Name: Noah King    MRN: [id number]   Date: 09/24/2023    DOB: Dec 01, 1945   STEREOTACTIC RADIOSURGERY OPERATIVE NOTE  PRE-OPERATIVE DIAGNOSIS:  Metastatic brain disease s/p resection  POST-OPERATIVE DIAGNOSIS:  Metastatic brain disease s/p resection  PROCEDURE:  Stereotactic Radiosurgery using TrueBeam Linac device  SURGEON:  Dorn Ned, MD  RADIATION ONCOLOGIST: Norleen Limes, MD  TECHNIQUE:  The patient underwent a radiation treatment planning session in the radiation oncology simulation suite under the care of the radiation oncology physician and physicist.  I participated closely in the radiation treatment planning afterwards. The patient underwent planning CT which was fused to 3T high resolution MRI with 1 mm axial slices.  These images were fused on the planning system.  We contoured the gross target volumes and subsequently expanded this to yield the Planning Target Volume. I actively participated in the planning process.  I helped to define and review the target contours and also the contours of the optic pathway, eyes, brainstem and selected nearby organs at risk.  All the dose constraints for critical structures were reviewed and compared to AAPM Task Group 101.  The prescription dose conformity was reviewed.  I approved the plan electronically.    Accordingly, Noah King  was brought to the TrueBeam stereotactic radiation treatment linac and placed in the custom immobilization mask.  The patient was aligned according to the IR fiducial markers with BrainLab Exactrac, then orthogonal x-rays were used in ExacTrac with the 6DOF robotic table and the shifts were made to align the patient  Noah King received stereotactic radiosurgery to a prescription dose of 9 Gy to the surgical cavity uneventfully, with plan for two additional fractions for a total of 27 Gy.  The detailed description of the procedure is recorded in the radiation oncology procedure note.  I was  present for the duration of the procedure.  DISPOSITION:   Following delivery, the patient was transported to nursing in stable condition and monitored for possible acute effects to be discharged to home in stable condition with follow-up in one month.  Dorn Ned, MD [facility]

## 2023-09-29 NOTE — Telephone Encounter (Signed)
 I spoke with the patient and his daughter about chemoradiation as was recommended by Dr. Sherrod. We discussed the risks, benefits, short, and long term effects of radiotherapy, as well as the definitive overall intent, and the patient is interested in proceeding.  He will come for simulation on 9/18, and start treatment with chemo and xrt on 10/07/23.

## 2023-09-30 ENCOUNTER — Ambulatory Visit: Admitting: Radiation Oncology

## 2023-09-30 DIAGNOSIS — D45 Polycythemia vera: Secondary | ICD-10-CM | POA: Diagnosis not present

## 2023-09-30 DIAGNOSIS — E039 Hypothyroidism, unspecified: Secondary | ICD-10-CM | POA: Diagnosis not present

## 2023-09-30 DIAGNOSIS — R911 Solitary pulmonary nodule: Secondary | ICD-10-CM | POA: Diagnosis not present

## 2023-09-30 DIAGNOSIS — R32 Unspecified urinary incontinence: Secondary | ICD-10-CM | POA: Diagnosis not present

## 2023-09-30 DIAGNOSIS — E785 Hyperlipidemia, unspecified: Secondary | ICD-10-CM | POA: Diagnosis not present

## 2023-09-30 DIAGNOSIS — Z87891 Personal history of nicotine dependence: Secondary | ICD-10-CM | POA: Diagnosis not present

## 2023-09-30 DIAGNOSIS — I10 Essential (primary) hypertension: Secondary | ICD-10-CM | POA: Diagnosis not present

## 2023-09-30 DIAGNOSIS — K219 Gastro-esophageal reflux disease without esophagitis: Secondary | ICD-10-CM | POA: Diagnosis not present

## 2023-09-30 DIAGNOSIS — F1010 Alcohol abuse, uncomplicated: Secondary | ICD-10-CM | POA: Diagnosis not present

## 2023-10-01 DIAGNOSIS — E039 Hypothyroidism, unspecified: Secondary | ICD-10-CM | POA: Diagnosis not present

## 2023-10-01 DIAGNOSIS — K219 Gastro-esophageal reflux disease without esophagitis: Secondary | ICD-10-CM | POA: Diagnosis not present

## 2023-10-01 DIAGNOSIS — D45 Polycythemia vera: Secondary | ICD-10-CM | POA: Diagnosis not present

## 2023-10-01 DIAGNOSIS — R911 Solitary pulmonary nodule: Secondary | ICD-10-CM | POA: Diagnosis not present

## 2023-10-01 DIAGNOSIS — E785 Hyperlipidemia, unspecified: Secondary | ICD-10-CM | POA: Diagnosis not present

## 2023-10-01 DIAGNOSIS — Z483 Aftercare following surgery for neoplasm: Secondary | ICD-10-CM | POA: Diagnosis not present

## 2023-10-01 DIAGNOSIS — Z87891 Personal history of nicotine dependence: Secondary | ICD-10-CM | POA: Diagnosis not present

## 2023-10-01 DIAGNOSIS — I10 Essential (primary) hypertension: Secondary | ICD-10-CM | POA: Diagnosis not present

## 2023-10-01 DIAGNOSIS — R32 Unspecified urinary incontinence: Secondary | ICD-10-CM | POA: Diagnosis not present

## 2023-10-01 DIAGNOSIS — F1010 Alcohol abuse, uncomplicated: Secondary | ICD-10-CM | POA: Diagnosis not present

## 2023-10-02 ENCOUNTER — Ambulatory Visit
Admission: RE | Admit: 2023-10-02 | Discharge: 2023-10-02 | Disposition: A | Discharge status: Home / Self Care | Source: Ambulatory Visit | Attending: Radiation Oncology | Admitting: Radiation Oncology

## 2023-10-02 DIAGNOSIS — C7931 Secondary malignant neoplasm of brain: Secondary | ICD-10-CM | POA: Diagnosis present

## 2023-10-02 DIAGNOSIS — C3431 Malignant neoplasm of lower lobe, right bronchus or lung: Secondary | ICD-10-CM | POA: Diagnosis present

## 2023-10-02 DIAGNOSIS — Z510 Encounter for antineoplastic radiation therapy: Secondary | ICD-10-CM | POA: Diagnosis present

## 2023-10-03 DIAGNOSIS — R911 Solitary pulmonary nodule: Secondary | ICD-10-CM | POA: Diagnosis not present

## 2023-10-03 DIAGNOSIS — Z483 Aftercare following surgery for neoplasm: Secondary | ICD-10-CM | POA: Diagnosis not present

## 2023-10-03 DIAGNOSIS — I10 Essential (primary) hypertension: Secondary | ICD-10-CM | POA: Diagnosis not present

## 2023-10-03 DIAGNOSIS — Z87891 Personal history of nicotine dependence: Secondary | ICD-10-CM | POA: Diagnosis not present

## 2023-10-03 DIAGNOSIS — E785 Hyperlipidemia, unspecified: Secondary | ICD-10-CM | POA: Diagnosis not present

## 2023-10-03 DIAGNOSIS — D45 Polycythemia vera: Secondary | ICD-10-CM | POA: Diagnosis not present

## 2023-10-03 DIAGNOSIS — R32 Unspecified urinary incontinence: Secondary | ICD-10-CM | POA: Diagnosis not present

## 2023-10-03 DIAGNOSIS — F1010 Alcohol abuse, uncomplicated: Secondary | ICD-10-CM | POA: Diagnosis not present

## 2023-10-03 DIAGNOSIS — E039 Hypothyroidism, unspecified: Secondary | ICD-10-CM | POA: Diagnosis not present

## 2023-10-03 DIAGNOSIS — K219 Gastro-esophageal reflux disease without esophagitis: Secondary | ICD-10-CM | POA: Diagnosis not present

## 2023-10-06 ENCOUNTER — Inpatient Hospital Stay

## 2023-10-06 DIAGNOSIS — E785 Hyperlipidemia, unspecified: Secondary | ICD-10-CM | POA: Diagnosis not present

## 2023-10-06 DIAGNOSIS — C3431 Malignant neoplasm of lower lobe, right bronchus or lung: Secondary | ICD-10-CM | POA: Diagnosis not present

## 2023-10-06 DIAGNOSIS — N4289 Other specified disorders of prostate: Secondary | ICD-10-CM

## 2023-10-06 DIAGNOSIS — Z510 Encounter for antineoplastic radiation therapy: Secondary | ICD-10-CM | POA: Diagnosis not present

## 2023-10-06 DIAGNOSIS — E039 Hypothyroidism, unspecified: Secondary | ICD-10-CM | POA: Diagnosis not present

## 2023-10-06 DIAGNOSIS — D45 Polycythemia vera: Secondary | ICD-10-CM | POA: Diagnosis not present

## 2023-10-06 DIAGNOSIS — Z483 Aftercare following surgery for neoplasm: Secondary | ICD-10-CM | POA: Diagnosis not present

## 2023-10-06 DIAGNOSIS — I10 Essential (primary) hypertension: Secondary | ICD-10-CM | POA: Diagnosis not present

## 2023-10-06 DIAGNOSIS — K219 Gastro-esophageal reflux disease without esophagitis: Secondary | ICD-10-CM | POA: Diagnosis not present

## 2023-10-06 DIAGNOSIS — C7931 Secondary malignant neoplasm of brain: Secondary | ICD-10-CM | POA: Diagnosis not present

## 2023-10-06 DIAGNOSIS — Z79899 Other long term (current) drug therapy: Secondary | ICD-10-CM | POA: Diagnosis not present

## 2023-10-06 DIAGNOSIS — Z87891 Personal history of nicotine dependence: Secondary | ICD-10-CM | POA: Diagnosis not present

## 2023-10-06 DIAGNOSIS — R911 Solitary pulmonary nodule: Secondary | ICD-10-CM | POA: Diagnosis not present

## 2023-10-06 DIAGNOSIS — R32 Unspecified urinary incontinence: Secondary | ICD-10-CM | POA: Diagnosis not present

## 2023-10-06 DIAGNOSIS — Z5111 Encounter for antineoplastic chemotherapy: Secondary | ICD-10-CM | POA: Diagnosis not present

## 2023-10-06 LAB — CBC WITH DIFFERENTIAL (CANCER CENTER ONLY)
Abs Immature Granulocytes: 0.03 K/uL (ref 0.00–0.07)
Basophils Absolute: 0 K/uL (ref 0.0–0.1)
Basophils Relative: 1 %
Eosinophils Absolute: 0.3 K/uL (ref 0.0–0.5)
Eosinophils Relative: 5 %
HCT: 40.8 % (ref 39.0–52.0)
Hemoglobin: 13.6 g/dL (ref 13.0–17.0)
Immature Granulocytes: 1 %
Lymphocytes Relative: 22 %
Lymphs Abs: 1.3 K/uL (ref 0.7–4.0)
MCH: 27.9 pg (ref 26.0–34.0)
MCHC: 33.3 g/dL (ref 30.0–36.0)
MCV: 83.8 fL (ref 80.0–100.0)
Monocytes Absolute: 0.7 K/uL (ref 0.1–1.0)
Monocytes Relative: 11 %
Neutro Abs: 3.5 K/uL (ref 1.7–7.7)
Neutrophils Relative %: 60 %
Platelet Count: 180 K/uL (ref 150–400)
RBC: 4.87 MIL/uL (ref 4.22–5.81)
RDW: 17 % — ABNORMAL HIGH (ref 11.5–15.5)
WBC Count: 5.8 K/uL (ref 4.0–10.5)
nRBC: 0 % (ref 0.0–0.2)

## 2023-10-06 LAB — CMP (CANCER CENTER ONLY)
ALT: 20 U/L (ref 0–44)
AST: 18 U/L (ref 15–41)
Albumin: 4.1 g/dL (ref 3.5–5.0)
Alkaline Phosphatase: 79 U/L (ref 38–126)
Anion gap: 5 (ref 5–15)
BUN: 8 mg/dL (ref 8–23)
CO2: 30 mmol/L (ref 22–32)
Calcium: 9.5 mg/dL (ref 8.9–10.3)
Chloride: 106 mmol/L (ref 98–111)
Creatinine: 0.74 mg/dL (ref 0.61–1.24)
GFR, Estimated: >60 mL/min (ref >60)
Glucose, Bld: 79 mg/dL (ref 70–99)
Potassium: 4.5 mmol/L (ref 3.5–5.1)
Sodium: 141 mmol/L (ref 135–145)
Total Bilirubin: 1.2 mg/dL (ref 0.0–1.2)
Total Protein: 6.8 g/dL (ref 6.5–8.1)

## 2023-10-07 ENCOUNTER — Other Ambulatory Visit: Payer: Self-pay

## 2023-10-07 ENCOUNTER — Ambulatory Visit
Admission: RE | Admit: 2023-10-07 | Discharge: 2023-10-07 | Disposition: A | Discharge status: Home / Self Care | Source: Ambulatory Visit | Attending: Radiation Oncology | Admitting: Radiation Oncology

## 2023-10-07 ENCOUNTER — Other Ambulatory Visit

## 2023-10-07 ENCOUNTER — Inpatient Hospital Stay

## 2023-10-07 VITALS — BP 113/72 | HR 95 | Temp 99.0°F | Resp 16 | Ht 70.0 in | Wt 155.6 lb

## 2023-10-07 DIAGNOSIS — Z510 Encounter for antineoplastic radiation therapy: Secondary | ICD-10-CM | POA: Diagnosis not present

## 2023-10-07 DIAGNOSIS — Z5111 Encounter for antineoplastic chemotherapy: Secondary | ICD-10-CM | POA: Diagnosis not present

## 2023-10-07 DIAGNOSIS — Z79899 Other long term (current) drug therapy: Secondary | ICD-10-CM | POA: Diagnosis not present

## 2023-10-07 DIAGNOSIS — C3431 Malignant neoplasm of lower lobe, right bronchus or lung: Secondary | ICD-10-CM

## 2023-10-07 LAB — RAD ONC ARIA SESSION SUMMARY
Course Elapsed Days: 0
Plan Fractions Treated to Date: 1
Plan Prescribed Dose Per Fraction: 2 Gy
Plan Total Fractions Prescribed: 30
Plan Total Prescribed Dose: 60 Gy
Reference Point Dosage Given to Date: 2 Gy
Reference Point Session Dosage Given: 2 Gy
Session Number: 1

## 2023-10-07 LAB — PSA, TOTAL AND FREE
PSA, Free Pct: 10.6 %
PSA, Free: 0.33 ng/mL
Prostate Specific Ag, Serum: 3.1 ng/mL (ref 0.0–4.0)

## 2023-10-07 MED ORDER — PALONOSETRON HCL INJECTION 0.25 MG/5ML
0.2500 mg | Freq: Once | INTRAVENOUS | Status: AC
  Administered 2023-10-07: 0.25 mg via INTRAVENOUS
  Filled 2023-10-07: qty 5

## 2023-10-07 MED ORDER — SODIUM CHLORIDE 0.9 % IV SOLN
172.4000 mg | Freq: Once | INTRAVENOUS | Status: AC
  Administered 2023-10-07: 170 mg via INTRAVENOUS
  Filled 2023-10-07: qty 17

## 2023-10-07 MED ORDER — FAMOTIDINE IN NACL 20-0.9 MG/50ML-% IV SOLN
20.0000 mg | Freq: Once | INTRAVENOUS | Status: AC
  Administered 2023-10-07: 20 mg via INTRAVENOUS
  Filled 2023-10-07: qty 50

## 2023-10-07 MED ORDER — SODIUM CHLORIDE 0.9 % IV SOLN: INTRAVENOUS | Status: DC

## 2023-10-07 MED ORDER — SODIUM CHLORIDE 0.9 % IV SOLN
45.0000 mg/m2 | Freq: Once | INTRAVENOUS | Status: AC
  Administered 2023-10-07: 84 mg via INTRAVENOUS
  Filled 2023-10-07: qty 14

## 2023-10-07 MED ORDER — DEXAMETHASONE SODIUM PHOSPHATE 10 MG/ML IJ SOLN
10.0000 mg | Freq: Once | INTRAMUSCULAR | Status: AC
  Administered 2023-10-07: 10 mg via INTRAVENOUS
  Filled 2023-10-07: qty 1

## 2023-10-07 MED ORDER — DIPHENHYDRAMINE HCL 50 MG/ML IJ SOLN
50.0000 mg | Freq: Once | INTRAMUSCULAR | Status: AC
  Administered 2023-10-07: 50 mg via INTRAVENOUS
  Filled 2023-10-07: qty 1

## 2023-10-07 NOTE — Patient Instructions (Signed)
 [facility] - A DEPT OF [facility]  Discharge Instructions: Thank you for choosing [facility] to provide your oncology and hematology care.   If you have a lab appointment with the Cancer Center, please go directly to the Cancer Center and check in at the registration area.   Wear comfortable clothing and clothing appropriate for easy access to any Portacath or PICC line.   We strive to give you quality time with your provider. You may need to reschedule your appointment if you arrive late (15 or more minutes).  Arriving late affects you and other patients whose appointments are after yours.  Also, if you miss three or more appointments without notifying the office, you may be dismissed from the clinic at the provider's discretion.      For prescription refill requests, have your pharmacy contact our office and allow 72 hours for refills to be completed.    Today you received the following chemotherapy and/or immunotherapy agents: Paclitaxel , Carboplatin .       To help prevent nausea and vomiting after your treatment, we encourage you to take your nausea medication as directed.  BELOW ARE SYMPTOMS THAT SHOULD BE REPORTED IMMEDIATELY: *FEVER GREATER THAN 100.4 F (38 C) OR HIGHER *CHILLS OR SWEATING *NAUSEA AND VOMITING THAT IS NOT CONTROLLED WITH YOUR NAUSEA MEDICATION *UNUSUAL SHORTNESS OF BREATH *UNUSUAL BRUISING OR BLEEDING *URINARY PROBLEMS (pain or burning when urinating, or frequent urination) *BOWEL PROBLEMS (unusual diarrhea, constipation, pain near the anus) TENDERNESS IN MOUTH AND THROAT WITH OR WITHOUT PRESENCE OF ULCERS (sore throat, sores in mouth, or a toothache) UNUSUAL RASH, SWELLING OR PAIN  UNUSUAL VAGINAL DISCHARGE OR ITCHING   Items with * indicate a potential emergency and should be followed up as soon as possible or go to the Emergency Department if any problems should occur.  Please show the CHEMOTHERAPY ALERT CARD  or IMMUNOTHERAPY ALERT CARD at check-in to the Emergency Department and triage nurse.  Should you have questions after your visit or need to cancel or reschedule your appointment, please contact [facility] - A DEPT OF [facility]  Dept: [phone]  and follow the prompts.  Office hours are 8:00 a.m. to 4:30 p.m. Monday - Friday. Please note that voicemails left after 4:00 p.m. may not be returned until the following business day.  We are closed weekends and major holidays. You have access to a nurse at all times for urgent questions. Please call the main number to the clinic Dept: [phone] and follow the prompts.   For any non-urgent questions, you may also contact your provider using MyChart. We now offer e-Visits for anyone 64 and older to request care online for non-urgent symptoms. For details visit [email or web address].   Also download the MyChart app! Go to the app store, search MyChart, open the app, select [employer], and log in with your MyChart username and password.

## 2023-10-08 ENCOUNTER — Ambulatory Visit
Admission: RE | Admit: 2023-10-08 | Discharge: 2023-10-08 | Disposition: A | Discharge status: Home / Self Care | Source: Ambulatory Visit | Attending: Radiation Oncology | Admitting: Radiation Oncology

## 2023-10-08 ENCOUNTER — Other Ambulatory Visit: Payer: Self-pay

## 2023-10-08 ENCOUNTER — Other Ambulatory Visit

## 2023-10-08 DIAGNOSIS — Z483 Aftercare following surgery for neoplasm: Secondary | ICD-10-CM | POA: Diagnosis not present

## 2023-10-08 DIAGNOSIS — R32 Unspecified urinary incontinence: Secondary | ICD-10-CM | POA: Diagnosis not present

## 2023-10-08 DIAGNOSIS — Z87891 Personal history of nicotine dependence: Secondary | ICD-10-CM | POA: Diagnosis not present

## 2023-10-08 DIAGNOSIS — F1010 Alcohol abuse, uncomplicated: Secondary | ICD-10-CM | POA: Diagnosis not present

## 2023-10-08 DIAGNOSIS — Z510 Encounter for antineoplastic radiation therapy: Secondary | ICD-10-CM | POA: Diagnosis not present

## 2023-10-08 DIAGNOSIS — R911 Solitary pulmonary nodule: Secondary | ICD-10-CM | POA: Diagnosis not present

## 2023-10-08 DIAGNOSIS — E039 Hypothyroidism, unspecified: Secondary | ICD-10-CM | POA: Diagnosis not present

## 2023-10-08 DIAGNOSIS — E785 Hyperlipidemia, unspecified: Secondary | ICD-10-CM | POA: Diagnosis not present

## 2023-10-08 DIAGNOSIS — D45 Polycythemia vera: Secondary | ICD-10-CM | POA: Diagnosis not present

## 2023-10-08 DIAGNOSIS — I10 Essential (primary) hypertension: Secondary | ICD-10-CM | POA: Diagnosis not present

## 2023-10-08 DIAGNOSIS — K219 Gastro-esophageal reflux disease without esophagitis: Secondary | ICD-10-CM | POA: Diagnosis not present

## 2023-10-08 LAB — RAD ONC ARIA SESSION SUMMARY
Course Elapsed Days: 1
Plan Fractions Treated to Date: 2
Plan Prescribed Dose Per Fraction: 2 Gy
Plan Total Fractions Prescribed: 30
Plan Total Prescribed Dose: 60 Gy
Reference Point Dosage Given to Date: 4 Gy
Reference Point Session Dosage Given: 2 Gy
Session Number: 2

## 2023-10-09 ENCOUNTER — Ambulatory Visit
Admission: RE | Admit: 2023-10-09 | Discharge: 2023-10-09 | Disposition: A | Discharge status: Home / Self Care | Source: Ambulatory Visit | Attending: Radiation Oncology | Admitting: Radiation Oncology

## 2023-10-09 ENCOUNTER — Encounter: Payer: Self-pay | Admitting: Internal Medicine

## 2023-10-09 ENCOUNTER — Other Ambulatory Visit: Payer: Self-pay

## 2023-10-09 ENCOUNTER — Telehealth: Payer: Self-pay

## 2023-10-09 DIAGNOSIS — Z483 Aftercare following surgery for neoplasm: Secondary | ICD-10-CM | POA: Diagnosis not present

## 2023-10-09 DIAGNOSIS — R911 Solitary pulmonary nodule: Secondary | ICD-10-CM | POA: Diagnosis not present

## 2023-10-09 DIAGNOSIS — E039 Hypothyroidism, unspecified: Secondary | ICD-10-CM | POA: Diagnosis not present

## 2023-10-09 DIAGNOSIS — Z510 Encounter for antineoplastic radiation therapy: Secondary | ICD-10-CM | POA: Diagnosis not present

## 2023-10-09 DIAGNOSIS — R32 Unspecified urinary incontinence: Secondary | ICD-10-CM | POA: Diagnosis not present

## 2023-10-09 DIAGNOSIS — K219 Gastro-esophageal reflux disease without esophagitis: Secondary | ICD-10-CM | POA: Diagnosis not present

## 2023-10-09 DIAGNOSIS — I10 Essential (primary) hypertension: Secondary | ICD-10-CM | POA: Diagnosis not present

## 2023-10-09 DIAGNOSIS — D45 Polycythemia vera: Secondary | ICD-10-CM | POA: Diagnosis not present

## 2023-10-09 DIAGNOSIS — Z87891 Personal history of nicotine dependence: Secondary | ICD-10-CM | POA: Diagnosis not present

## 2023-10-09 DIAGNOSIS — F1010 Alcohol abuse, uncomplicated: Secondary | ICD-10-CM | POA: Diagnosis not present

## 2023-10-09 DIAGNOSIS — E785 Hyperlipidemia, unspecified: Secondary | ICD-10-CM | POA: Diagnosis not present

## 2023-10-09 LAB — RAD ONC ARIA SESSION SUMMARY
Course Elapsed Days: 2
Plan Fractions Treated to Date: 3
Plan Prescribed Dose Per Fraction: 2 Gy
Plan Total Fractions Prescribed: 30
Plan Total Prescribed Dose: 60 Gy
Reference Point Dosage Given to Date: 6 Gy
Reference Point Session Dosage Given: 2 Gy
Session Number: 3

## 2023-10-09 NOTE — Telephone Encounter (Signed)
-----   Message from Nurse Deland S sent at 10/07/2023  5:20 PM EDT ----- Pt is Day 1 paclitaxel , Carboplatin  a pt of Dr Sherrod, pt did well, pt dc from infusion area in wheelchair with no complaints

## 2023-10-10 ENCOUNTER — Ambulatory Visit
Admission: RE | Admit: 2023-10-10 | Discharge: 2023-10-10 | Disposition: A | Discharge status: Home / Self Care | Source: Ambulatory Visit | Attending: Radiation Oncology | Admitting: Radiation Oncology

## 2023-10-10 ENCOUNTER — Other Ambulatory Visit: Payer: Self-pay

## 2023-10-10 ENCOUNTER — Telehealth: Payer: Self-pay | Admitting: Medical Oncology

## 2023-10-10 DIAGNOSIS — Z510 Encounter for antineoplastic radiation therapy: Secondary | ICD-10-CM | POA: Diagnosis not present

## 2023-10-10 LAB — RAD ONC ARIA SESSION SUMMARY
Course Elapsed Days: 3
Plan Fractions Treated to Date: 4
Plan Prescribed Dose Per Fraction: 2 Gy
Plan Total Fractions Prescribed: 30
Plan Total Prescribed Dose: 60 Gy
Reference Point Dosage Given to Date: 8 Gy
Reference Point Session Dosage Given: 2 Gy
Session Number: 4

## 2023-10-10 NOTE — Telephone Encounter (Signed)
 Asking if pt brings his own ice.   I instructed pt to bring his 4 , own gallon  ziplock bags for ice and the nurse will apply ice to his hands and feet during Taxol ..

## 2023-10-13 ENCOUNTER — Ambulatory Visit
Admission: RE | Admit: 2023-10-13 | Discharge: 2023-10-13 | Disposition: A | Discharge status: Home / Self Care | Source: Ambulatory Visit | Attending: Radiation Oncology | Admitting: Radiation Oncology

## 2023-10-13 ENCOUNTER — Inpatient Hospital Stay (HOSPITAL_BASED_OUTPATIENT_CLINIC_OR_DEPARTMENT_OTHER): Admitting: Internal Medicine

## 2023-10-13 ENCOUNTER — Inpatient Hospital Stay

## 2023-10-13 ENCOUNTER — Other Ambulatory Visit: Payer: Self-pay

## 2023-10-13 VITALS — BP 126/80 | HR 97 | Temp 96.9°F | Resp 17 | Ht 70.0 in | Wt 155.0 lb

## 2023-10-13 DIAGNOSIS — C781 Secondary malignant neoplasm of mediastinum: Secondary | ICD-10-CM | POA: Diagnosis not present

## 2023-10-13 DIAGNOSIS — C3431 Malignant neoplasm of lower lobe, right bronchus or lung: Secondary | ICD-10-CM

## 2023-10-13 DIAGNOSIS — Z5111 Encounter for antineoplastic chemotherapy: Secondary | ICD-10-CM | POA: Diagnosis not present

## 2023-10-13 DIAGNOSIS — C718 Malignant neoplasm of overlapping sites of brain: Secondary | ICD-10-CM | POA: Diagnosis not present

## 2023-10-13 DIAGNOSIS — Z510 Encounter for antineoplastic radiation therapy: Secondary | ICD-10-CM | POA: Diagnosis not present

## 2023-10-13 LAB — CMP (CANCER CENTER ONLY)
ALT: 22 U/L (ref 0–44)
AST: 17 U/L (ref 15–41)
Albumin: 4.1 g/dL (ref 3.5–5.0)
Alkaline Phosphatase: 66 U/L (ref 38–126)
Anion gap: 7 (ref 5–15)
BUN: 15 mg/dL (ref 8–23)
CO2: 28 mmol/L (ref 22–32)
Calcium: 9.4 mg/dL (ref 8.9–10.3)
Chloride: 105 mmol/L (ref 98–111)
Creatinine: 0.82 mg/dL (ref 0.61–1.24)
GFR, Estimated: >60 mL/min (ref >60)
Glucose, Bld: 121 mg/dL — ABNORMAL HIGH (ref 70–99)
Potassium: 4 mmol/L (ref 3.5–5.1)
Sodium: 140 mmol/L (ref 135–145)
Total Bilirubin: 1.7 mg/dL — ABNORMAL HIGH (ref 0.0–1.2)
Total Protein: 6.7 g/dL (ref 6.5–8.1)

## 2023-10-13 LAB — CBC WITH DIFFERENTIAL (CANCER CENTER ONLY)
Abs Immature Granulocytes: 0.01 K/uL (ref 0.00–0.07)
Basophils Absolute: 0 K/uL (ref 0.0–0.1)
Basophils Relative: 1 %
Eosinophils Absolute: 0.4 K/uL (ref 0.0–0.5)
Eosinophils Relative: 9 %
HCT: 38.9 % — ABNORMAL LOW (ref 39.0–52.0)
Hemoglobin: 13.1 g/dL (ref 13.0–17.0)
Immature Granulocytes: 0 %
Lymphocytes Relative: 18 %
Lymphs Abs: 0.7 K/uL (ref 0.7–4.0)
MCH: 28.2 pg (ref 26.0–34.0)
MCHC: 33.7 g/dL (ref 30.0–36.0)
MCV: 83.7 fL (ref 80.0–100.0)
Monocytes Absolute: 0.1 K/uL (ref 0.1–1.0)
Monocytes Relative: 3 %
Neutro Abs: 2.8 K/uL (ref 1.7–7.7)
Neutrophils Relative %: 69 %
Platelet Count: 172 K/uL (ref 150–400)
RBC: 4.65 MIL/uL (ref 4.22–5.81)
RDW: 16.4 % — ABNORMAL HIGH (ref 11.5–15.5)
WBC Count: 4.1 K/uL (ref 4.0–10.5)
nRBC: 0 % (ref 0.0–0.2)

## 2023-10-13 LAB — RAD ONC ARIA SESSION SUMMARY
Course Elapsed Days: 6
Plan Fractions Treated to Date: 5
Plan Prescribed Dose Per Fraction: 2 Gy
Plan Total Fractions Prescribed: 30
Plan Total Prescribed Dose: 60 Gy
Reference Point Dosage Given to Date: 10 Gy
Reference Point Session Dosage Given: 2 Gy
Session Number: 5

## 2023-10-13 MED ORDER — DEXAMETHASONE SODIUM PHOSPHATE 10 MG/ML IJ SOLN
10.0000 mg | Freq: Once | INTRAMUSCULAR | Status: AC
  Administered 2023-10-13: 10 mg via INTRAVENOUS
  Filled 2023-10-13: qty 1

## 2023-10-13 MED ORDER — FAMOTIDINE IN NACL 20-0.9 MG/50ML-% IV SOLN
20.0000 mg | Freq: Once | INTRAVENOUS | Status: AC
  Administered 2023-10-13: 20 mg via INTRAVENOUS
  Filled 2023-10-13: qty 50

## 2023-10-13 MED ORDER — PALONOSETRON HCL INJECTION 0.25 MG/5ML
0.2500 mg | Freq: Once | INTRAVENOUS | Status: AC
  Administered 2023-10-13: 0.25 mg via INTRAVENOUS
  Filled 2023-10-13: qty 5

## 2023-10-13 MED ORDER — SODIUM CHLORIDE 0.9 % IV SOLN
45.0000 mg/m2 | Freq: Once | INTRAVENOUS | Status: AC
  Administered 2023-10-13: 84 mg via INTRAVENOUS
  Filled 2023-10-13: qty 14

## 2023-10-13 MED ORDER — DIPHENHYDRAMINE HCL 50 MG/ML IJ SOLN
50.0000 mg | Freq: Once | INTRAMUSCULAR | Status: AC
  Administered 2023-10-13: 50 mg via INTRAVENOUS
  Filled 2023-10-13: qty 1

## 2023-10-13 MED ORDER — SODIUM CHLORIDE 0.9 % IV SOLN: INTRAVENOUS | Status: DC

## 2023-10-13 MED ORDER — SODIUM CHLORIDE 0.9 % IV SOLN
172.4000 mg | Freq: Once | INTRAVENOUS | Status: AC
  Administered 2023-10-13: 170 mg via INTRAVENOUS
  Filled 2023-10-13: qty 17

## 2023-10-13 NOTE — Addendum Note (Signed)
 Addended by: KOLEEN RENSHAW R on: 10/13/2023 12:15 PM   Modules accepted: Orders

## 2023-10-13 NOTE — Progress Notes (Signed)
 Per Dr Gatha note from today: Lab work is satisfactory for continuation of treatment.

## 2023-10-13 NOTE — Progress Notes (Signed)
 [facility] [phone]   [phone]  OFFICE PROGRESS NOTE  Merna Huxley, NP [address] [place] [address]  DIAGNOSIS:  stage IV (T1a, N2b, M1b) non-small cell lung cancer, adenocarcinoma presented with right lower lobe lung nodule in addition to right hilar and mediastinal lymphadenopathy and metastatic brain lesions diagnosed in August 2025.  The patient also has smaller pulmonary nodules in the right and left lung.  Molecular studies by foundation 1 showed no actionable mutations and negative PD-L1 expression.  PRIOR THERAPY: Status post left craniotomy with resection of the left frontoparietal brain tumor followed by [facility].   CURRENT THERAPY:  INTERVAL HISTORY: Noah King 78 y.o. male returns to the clinic today for follow-up visit accompanied by his daughter. Discussed the use of AI scribe software for clinical note transcription with the patient, who gave verbal consent to proceed.  History of Present Illness Noah King is a 78 year old male who presents for evaluation before starting the second cycle of concurrent chemoradiation therapy. He is accompanied by his daughter.  He is currently undergoing concurrent chemoradiation therapy with weekly carboplatin  and paclitaxel . He has completed one cycle and is here for evaluation before starting the second cycle. During the first week of treatment, he experienced mild discomfort in his lower legs around the third or fourth day but had no nausea or other significant side effects.  He has experienced a slight weight loss, dropping from 156 pounds to 151 pounds. His appetite has been reduced, but he is supplementing his diet with protein powder and fruit shakes to mitigate the weight loss.  No chest pain or breathing issues.     MEDICAL HISTORY: Past Medical History:  Diagnosis Date   Allergy    Cataract    left  extraction- right eye forming   Dupuytren's  contracture    GERD (gastroesophageal reflux disease)    Hiatal hernia    past hx-  suspect at one time   Hypertension    Polycythemia     ALLERGIES:  is allergic to lotensin  [benazepril  hcl].  MEDICATIONS:  Current Outpatient Medications  Medication Sig Dispense Refill   atorvastatin  (LIPITOR) 20 MG tablet Take 1 tablet (20 mg total) by mouth daily. 90 tablet 3   docusate sodium  (COLACE) 100 MG capsule Take 100 mg by mouth daily as needed (constipation).     folic acid  (FOLVITE ) 1 MG tablet Take 1 tablet (1 mg total) by mouth daily. 30 tablet 0   lidocaine -prilocaine  (EMLA ) cream Apply to affected area once 30 g 3   losartan  (COZAAR ) 25 MG tablet Take 1 tablet (25 mg total) by mouth daily. 90 tablet 3   Multiple Vitamins-Minerals (MENS 50+ MULTIVITAMIN) TABS Take 1 tablet by mouth daily.     ondansetron  (ZOFRAN ) 8 MG tablet Take 1 tablet (8 mg total) by mouth every 8 (eight) hours as needed for nausea or vomiting. Start on the third day after chemotherapy. 30 tablet 1   pantoprazole  (PROTONIX ) 40 MG tablet Take 1 tablet (40 mg total) by mouth daily. 90 tablet 1   polyethylene glycol powder (GLYCOLAX /MIRALAX ) 17 GM/SCOOP powder Take 17 g by mouth daily as needed for mild constipation. 238 g 0   prochlorperazine  (COMPAZINE ) 10 MG tablet Take 1 tablet (10 mg total) by mouth every 6 (six) hours as needed for nausea or vomiting. 30 tablet 1   thiamine  (VITAMIN B1) 100 MG tablet Take 1 tablet (100 mg total)  by mouth daily. 30 tablet 0   No current facility-administered medications for this visit.    SURGICAL HISTORY:  Past Surgical History:  Procedure Laterality Date   APPLICATION OF CRANIAL NAVIGATION Left 08/21/2023   Procedure: COMPUTER-ASSISTED NAVIGATION, FOR CRANIAL PROCEDURE;  Surgeon: Debby Dorn MATSU, MD;  Location: [facility] OR;  Service: Neurosurgery;  Laterality: Left;   cataract extraction left eye     COLONOSCOPY     CRANIOTOMY Left 08/21/2023   Procedure: CRANIOTOMY TUMOR  EXCISION;  Surgeon: Debby Dorn MATSU, MD;  Location: [facility] OR;  Service: Neurosurgery;  Laterality: Left;  LT CRANI FOR TUMOR RESECTION   DUPUYTREN CONTRACTURE RELEASE Left    HERNIA REPAIR  2024   POLYPECTOMY     rt knee arthroscopic     VIDEO BRONCHOSCOPY WITH ENDOBRONCHIAL ULTRASOUND Right 08/13/2023   Procedure: BRONCHOSCOPY, WITH EBUS;  Surgeon: Jude Harden GAILS, MD;  Location: [facility] ENDOSCOPY;  Service: Cardiopulmonary;  Laterality: Right;    REVIEW OF SYSTEMS:  A comprehensive review of systems was negative except for: Constitutional: positive for fatigue and weight loss   PHYSICAL EXAMINATION: General appearance: alert, cooperative, fatigued, and no distress Head: Normocephalic, without obvious abnormality, atraumatic Neck: no adenopathy, no JVD, supple, symmetrical, trachea midline, and thyroid  not enlarged, symmetric, no tenderness/mass/nodules Lymph nodes: Cervical, supraclavicular, and axillary nodes normal. Resp: clear to auscultation bilaterally Back: symmetric, no curvature. ROM normal. No CVA tenderness. Cardio: regular rate and rhythm, S1, S2 normal, no murmur, click, rub or gallop GI: soft, non-tender; bowel sounds normal; no masses,  no organomegaly Extremities: extremities normal, atraumatic, no cyanosis or edema  ECOG PERFORMANCE STATUS: 1 - Symptomatic but completely ambulatory  Blood pressure 126/80, pulse 97, temperature (!) 96.9 F (36.1 C), resp. rate 17, height 5' 10 (1.778 m), weight 155 lb (70.3 kg), SpO2 99%.  LABORATORY DATA: Lab Results  Component Value Date   WBC 4.1 10/13/2023   HGB 13.1 10/13/2023   HCT 38.9 (L) 10/13/2023   MCV 83.7 10/13/2023   PLT 172 10/13/2023      Chemistry      Component Value Date/Time   NA 141 10/06/2023 1038   K 4.5 10/06/2023 1038   CL 106 10/06/2023 1038   CO2 30 10/06/2023 1038   BUN 8 10/06/2023 1038   CREATININE 0.74 10/06/2023 1038   CREATININE 0.78 12/08/2019 0819      Component Value Date/Time   CALCIUM   9.5 10/06/2023 1038   ALKPHOS 79 10/06/2023 1038   AST 18 10/06/2023 1038   ALT 20 10/06/2023 1038   BILITOT 1.2 10/06/2023 1038       RADIOGRAPHIC STUDIES: MR Brain W Wo Contrast Result Date: 09/16/2023 CLINICAL DATA:  Provided history: Metastasis to brain. Metastatic disease evaluation. Postoperative treatment planning. EXAM: MRI HEAD WITHOUT AND WITH CONTRAST TECHNIQUE: Multiplanar, multiecho pulse sequences of the brain and surrounding structures were obtained without and with intravenous contrast. CONTRAST:  7mL GADAVIST  GADOBUTROL  1 MMOL/ML IV SOLN COMPARISON:  Prior brain MRI examinations 08/22/2023 and earlier. FINDINGS: Brain: Generalized cerebral atrophy. There has been interval partial retraction of the left parietal lobe resection cavity. Enhancement at the periphery of the resection cavity has increased in extent, but remains non-nodular. Only minimal residual edema surrounding the resection cavity. Mass effect upon the left lateral ventricle has resolved. Chronic blood products at the resection site. Decreased size of an enhancing lesion at the posterior aspect of the left hippocampus, now punctate (previously 4 mm) (series 1100, image 170). No new enhancing intracranial lesions  are identified. Mild multifocal T2 FLAIR hyperintense signal abnormality within the cerebral white matter, nonspecific but compatible with chronic small vessel ischemic disease. Unchanged chronic lacunar infarct within the right aspect of the pons. Several chronic microhemorrhages scattered elsewhere within the supratentorial and infratentorial brain, similar to the prior MRI. Smooth dural thickening and enhancement along the left cerebral convexity, likely postoperative. There is no acute infarct. No extra-axial fluid collection. No midline shift. Vascular: Maintained flow voids within the proximal large arterial vessels. Skull and upper cervical spine: Left parietal cranioplasty. No focal worrisome marrow lesion.  Sinuses/Orbits: No mass or acute finding within the imaged orbits. Prior left ocular lens replacement. Moderate mucosal thickening within the right maxillary sinus. Small mucous retention cyst within the left maxillary sinus. Partial opacification of a posterior left ethmoid air cell. Complete opacification of the left frontal sinus. IMPRESSION: 1. Interval partial retraction of the left parietal lobe resection cavity. Enhancement at the periphery of the resection cavity has increased in extent, but remains non-nodular. Attention recommended on MRI follow-up. Only minimal residual edema surrounding the resection site. 2. Decreased size of an enhancing lesion at the posterior aspect of the left hippocampus, now punctate (previously 4 mm). 3. Background parenchymal atrophy and chronic small vessel ischemic disease. 4. Paranasal sinus disease as described. Electronically Signed   By: Rockey Childs D.O.   On: 09/16/2023 17:55    ASSESSMENT AND PLAN: Stage IV (T1a, N2b, M1b) non-small cell lung cancer, adenocarcinoma presented with right lower lobe lung nodule in addition to right hilar and mediastinal lymphadenopathy and metastatic brain lesions diagnosed in August 2025.  Status post left craniotomy with resection of the left frontoparietal brain tumor followed by [facility].  The patient also has smaller pulmonary nodules in the right and left lung. Molecular studies by foundation 1 showed no actionable mutations and negative PD-L1 expression. The patient is currently undergoing a course of concurrent chemoradiation with weekly carboplatin  for AUC of 2 and paclitaxel  45 MGs/M2.  Status post 1 cycle. Assessment and Plan Assessment & Plan Lung cancer undergoing concurrent chemoradiation Lung cancer is being treated with concurrent chemoradiation, including weekly carboplatin  and paclitaxel . He reports mild discomfort in the lower legs on the third or fourth day post-treatment. No nausea is reported, but there is slight  weight loss. Lab work is satisfactory for continuation of treatment. - Continue weekly chemotherapy with carboplatin  and paclitaxel . - Monitor every other week during treatment. - Arrange a scan towards the end of the treatment to assess response.  Cancer-related weight loss Slight weight loss from 156 lbs to 151 lbs. Appetite is reportedly good, but dietary intake may be insufficient. Supplementation with protein powder and fruit shakes is being used to support nutritional intake. - Continue dietary supplementation with protein powder and fruit shakes. He was advised to call immediately if he has any concerning symptoms in the interval. The patient voices understanding of current disease status and treatment options and is in agreement with the current care plan.  All questions were answered. The patient knows to call the clinic with any problems, questions or concerns. We can certainly see the patient much sooner if necessary.   Disclaimer: This note was dictated with voice recognition software. Similar sounding words can inadvertently be transcribed and may not be corrected upon review.

## 2023-10-14 ENCOUNTER — Other Ambulatory Visit: Payer: Self-pay

## 2023-10-14 ENCOUNTER — Ambulatory Visit
Admission: RE | Admit: 2023-10-14 | Discharge: 2023-10-14 | Disposition: A | Discharge status: Home / Self Care | Source: Ambulatory Visit | Attending: Radiation Oncology | Admitting: Radiation Oncology

## 2023-10-14 DIAGNOSIS — Z510 Encounter for antineoplastic radiation therapy: Secondary | ICD-10-CM | POA: Diagnosis not present

## 2023-10-14 LAB — RAD ONC ARIA SESSION SUMMARY
Course Elapsed Days: 7
Plan Fractions Treated to Date: 6
Plan Prescribed Dose Per Fraction: 2 Gy
Plan Total Fractions Prescribed: 30
Plan Total Prescribed Dose: 60 Gy
Reference Point Dosage Given to Date: 12 Gy
Reference Point Session Dosage Given: 2 Gy
Session Number: 6

## 2023-10-15 ENCOUNTER — Other Ambulatory Visit: Payer: Self-pay

## 2023-10-15 ENCOUNTER — Inpatient Hospital Stay: Attending: Internal Medicine | Admitting: Dietician

## 2023-10-15 ENCOUNTER — Other Ambulatory Visit: Payer: Self-pay | Admitting: Radiation Therapy

## 2023-10-15 ENCOUNTER — Ambulatory Visit
Admission: RE | Admit: 2023-10-15 | Discharge: 2023-10-15 | Disposition: A | Discharge status: Home / Self Care | Source: Ambulatory Visit | Attending: Radiation Oncology | Admitting: Radiation Oncology

## 2023-10-15 DIAGNOSIS — C7931 Secondary malignant neoplasm of brain: Secondary | ICD-10-CM

## 2023-10-15 DIAGNOSIS — C3431 Malignant neoplasm of lower lobe, right bronchus or lung: Secondary | ICD-10-CM | POA: Diagnosis not present

## 2023-10-15 DIAGNOSIS — D701 Agranulocytosis secondary to cancer chemotherapy: Secondary | ICD-10-CM | POA: Insufficient documentation

## 2023-10-15 DIAGNOSIS — Z5111 Encounter for antineoplastic chemotherapy: Secondary | ICD-10-CM | POA: Insufficient documentation

## 2023-10-15 DIAGNOSIS — R530 Neoplastic (malignant) related fatigue: Secondary | ICD-10-CM | POA: Insufficient documentation

## 2023-10-15 DIAGNOSIS — Z923 Personal history of irradiation: Secondary | ICD-10-CM | POA: Insufficient documentation

## 2023-10-15 DIAGNOSIS — Z79899 Other long term (current) drug therapy: Secondary | ICD-10-CM | POA: Insufficient documentation

## 2023-10-15 DIAGNOSIS — Z9889 Other specified postprocedural states: Secondary | ICD-10-CM | POA: Insufficient documentation

## 2023-10-15 LAB — RAD ONC ARIA SESSION SUMMARY
Course Elapsed Days: 8
Plan Fractions Treated to Date: 7
Plan Prescribed Dose Per Fraction: 2 Gy
Plan Total Fractions Prescribed: 30
Plan Total Prescribed Dose: 60 Gy
Reference Point Dosage Given to Date: 14 Gy
Reference Point Session Dosage Given: 2 Gy
Session Number: 7

## 2023-10-15 NOTE — Progress Notes (Signed)
 Nutrition Assessment   Reason for Assessment: MST screen for weight loss.    ASSESSMENT: Patient is a 78 year old male with stage IV (T1a, N2b, M1b) non-small cell lung cancer, adenocarcinoma presented with right lower lobe lung nodule in addition to right hilar and mediastinal lymphadenopathy and metastatic brain lesions diagnosed in August 2025 Status post left craniotomy with resection of the left frontoparietal brain tumor followed by [facility]. SABRA The patient also has smaller pulmonary nodules in the right and left lung. He is undergoing concurrent chemoradiation therapy with weekly carboplatin  and paclitaxel . He's followed by Drs Sherrod and Dewey.  Patient reports no concerns with eating.  Weight loss was more gradual than EMR noted.  He states the weight on record were from oral history not actual weights.  He denies and NIS at this time.  No food allergies, appetite is fair.  Daughter is staying with him now and help with food prep. Drinking protein powder at least once a day. Breakfast: eggs and toast, cereal and milk, sometimes fruit Snacking: chips, cookies, cheese crackers, peanut butter  Coffee, 2-3 bottles of water, 1 electrolyte powder, haven't had beer in several months.  Supplements include: MVI daily, Elderberry gummies with Vit C   Anthropometrics:  weight los per EMR 27# less than month  Height: 70 Weight:  10/13/23  155# 10/07/23  155.6# 09/16/23  153.8# DBW: 170# BMI: 22.24    NUTRITION DIAGNOSIS: Food and Nutrition Related Knowledge Deficit related to cancer and associated treatments as evidenced by no prior need for nutrition related information.   INTERVENTION:  Relayed that nutrition services are wrap around service provided at no charge and encouraged continued communication if experiencing continued weight loss or any nutritional impact symptoms (NIS). Educated on importance of adequate calorie and protein energy intake  with nutrient dense foods when possible to  maintain weight/strength Asked to d/c elderberry supplement while on radiation. Encouraged  frequent feeds and trying to eat high protein snack between meals for desired weight gain Suggested oral nutrition supplement as needed if unable to eat and drink good protein sources Discussed need to replace calories from eliminating beer intake. Emailed Nutrition Tip for Eating during cancer treatments, soft protein foods with contact information provided   MONITORING, EVALUATION, GOAL: weight, PO intake, Nutrition Impact Symptoms, labs  Goal is weight gain 2-4#/month to DBW  Next Visit: PRN at patient or provider request  Micheline Craven, RDN, LDN Registered Dietitian, [facility] Part Time Remote (Usual office hours: Tuesday-Thursday) Cell: [phone]

## 2023-10-16 ENCOUNTER — Other Ambulatory Visit: Payer: Self-pay

## 2023-10-16 ENCOUNTER — Ambulatory Visit
Admission: RE | Admit: 2023-10-16 | Discharge: 2023-10-16 | Disposition: A | Discharge status: Home / Self Care | Source: Ambulatory Visit | Attending: Radiation Oncology | Admitting: Radiation Oncology

## 2023-10-16 DIAGNOSIS — R531 Weakness: Secondary | ICD-10-CM | POA: Diagnosis not present

## 2023-10-16 DIAGNOSIS — I10 Essential (primary) hypertension: Secondary | ICD-10-CM | POA: Diagnosis not present

## 2023-10-16 DIAGNOSIS — R32 Unspecified urinary incontinence: Secondary | ICD-10-CM | POA: Diagnosis not present

## 2023-10-16 DIAGNOSIS — E785 Hyperlipidemia, unspecified: Secondary | ICD-10-CM | POA: Diagnosis not present

## 2023-10-16 DIAGNOSIS — Z483 Aftercare following surgery for neoplasm: Secondary | ICD-10-CM | POA: Diagnosis not present

## 2023-10-16 DIAGNOSIS — D45 Polycythemia vera: Secondary | ICD-10-CM | POA: Diagnosis not present

## 2023-10-16 DIAGNOSIS — C3431 Malignant neoplasm of lower lobe, right bronchus or lung: Secondary | ICD-10-CM | POA: Diagnosis not present

## 2023-10-16 DIAGNOSIS — F1010 Alcohol abuse, uncomplicated: Secondary | ICD-10-CM | POA: Diagnosis not present

## 2023-10-16 DIAGNOSIS — K219 Gastro-esophageal reflux disease without esophagitis: Secondary | ICD-10-CM | POA: Diagnosis not present

## 2023-10-16 DIAGNOSIS — R911 Solitary pulmonary nodule: Secondary | ICD-10-CM | POA: Diagnosis not present

## 2023-10-16 DIAGNOSIS — Z87891 Personal history of nicotine dependence: Secondary | ICD-10-CM | POA: Diagnosis not present

## 2023-10-16 LAB — RAD ONC ARIA SESSION SUMMARY
Course Elapsed Days: 9
Plan Fractions Treated to Date: 8
Plan Prescribed Dose Per Fraction: 2 Gy
Plan Total Fractions Prescribed: 30
Plan Total Prescribed Dose: 60 Gy
Reference Point Dosage Given to Date: 16 Gy
Reference Point Session Dosage Given: 2 Gy
Session Number: 8

## 2023-10-17 ENCOUNTER — Other Ambulatory Visit: Payer: Self-pay

## 2023-10-17 ENCOUNTER — Ambulatory Visit
Admission: RE | Admit: 2023-10-17 | Discharge: 2023-10-17 | Disposition: A | Discharge status: Home / Self Care | Source: Ambulatory Visit | Attending: Radiation Oncology | Admitting: Radiation Oncology

## 2023-10-17 DIAGNOSIS — D45 Polycythemia vera: Secondary | ICD-10-CM | POA: Diagnosis not present

## 2023-10-17 DIAGNOSIS — C3431 Malignant neoplasm of lower lobe, right bronchus or lung: Secondary | ICD-10-CM | POA: Diagnosis not present

## 2023-10-17 LAB — RAD ONC ARIA SESSION SUMMARY
Course Elapsed Days: 10
Plan Fractions Treated to Date: 9
Plan Prescribed Dose Per Fraction: 2 Gy
Plan Total Fractions Prescribed: 30
Plan Total Prescribed Dose: 60 Gy
Reference Point Dosage Given to Date: 18 Gy
Reference Point Session Dosage Given: 2 Gy
Session Number: 9

## 2023-10-17 MED ORDER — SONAFINE EX EMUL
1.0000 | Freq: Two times a day (BID) | CUTANEOUS | Status: DC
  Administered 2023-10-17: 1 via TOPICAL

## 2023-10-20 ENCOUNTER — Ambulatory Visit
Admission: RE | Admit: 2023-10-20 | Discharge: 2023-10-20 | Disposition: A | Discharge status: Home / Self Care | Source: Ambulatory Visit | Attending: Radiation Oncology | Admitting: Radiation Oncology

## 2023-10-20 ENCOUNTER — Encounter: Payer: Self-pay | Admitting: Internal Medicine

## 2023-10-20 ENCOUNTER — Inpatient Hospital Stay

## 2023-10-20 ENCOUNTER — Other Ambulatory Visit: Payer: Self-pay

## 2023-10-20 VITALS — BP 96/85 | HR 84 | Temp 98.0°F | Resp 16 | Wt 152.8 lb

## 2023-10-20 DIAGNOSIS — D701 Agranulocytosis secondary to cancer chemotherapy: Secondary | ICD-10-CM | POA: Diagnosis not present

## 2023-10-20 DIAGNOSIS — C3431 Malignant neoplasm of lower lobe, right bronchus or lung: Secondary | ICD-10-CM

## 2023-10-20 DIAGNOSIS — C718 Malignant neoplasm of overlapping sites of brain: Secondary | ICD-10-CM | POA: Diagnosis not present

## 2023-10-20 DIAGNOSIS — Z9889 Other specified postprocedural states: Secondary | ICD-10-CM | POA: Diagnosis not present

## 2023-10-20 DIAGNOSIS — C7931 Secondary malignant neoplasm of brain: Secondary | ICD-10-CM | POA: Diagnosis not present

## 2023-10-20 DIAGNOSIS — Z79899 Other long term (current) drug therapy: Secondary | ICD-10-CM | POA: Diagnosis not present

## 2023-10-20 DIAGNOSIS — C781 Secondary malignant neoplasm of mediastinum: Secondary | ICD-10-CM | POA: Diagnosis not present

## 2023-10-20 DIAGNOSIS — Z923 Personal history of irradiation: Secondary | ICD-10-CM | POA: Diagnosis not present

## 2023-10-20 DIAGNOSIS — Z510 Encounter for antineoplastic radiation therapy: Secondary | ICD-10-CM | POA: Diagnosis not present

## 2023-10-20 DIAGNOSIS — Z5111 Encounter for antineoplastic chemotherapy: Secondary | ICD-10-CM | POA: Diagnosis present

## 2023-10-20 DIAGNOSIS — R530 Neoplastic (malignant) related fatigue: Secondary | ICD-10-CM | POA: Diagnosis not present

## 2023-10-20 LAB — CBC WITH DIFFERENTIAL (CANCER CENTER ONLY)
Abs Immature Granulocytes: 0.01 K/uL (ref 0.00–0.07)
Basophils Absolute: 0 K/uL (ref 0.0–0.1)
Basophils Relative: 1 %
Eosinophils Absolute: 0.1 K/uL (ref 0.0–0.5)
Eosinophils Relative: 6 %
HCT: 35.8 % — ABNORMAL LOW (ref 39.0–52.0)
Hemoglobin: 12.3 g/dL — ABNORMAL LOW (ref 13.0–17.0)
Immature Granulocytes: 0 %
Lymphocytes Relative: 19 %
Lymphs Abs: 0.5 K/uL — ABNORMAL LOW (ref 0.7–4.0)
MCH: 28.3 pg (ref 26.0–34.0)
MCHC: 34.4 g/dL (ref 30.0–36.0)
MCV: 82.5 fL (ref 80.0–100.0)
Monocytes Absolute: 0.2 K/uL (ref 0.1–1.0)
Monocytes Relative: 9 %
Neutro Abs: 1.5 K/uL — ABNORMAL LOW (ref 1.7–7.7)
Neutrophils Relative %: 65 %
Platelet Count: 196 K/uL (ref 150–400)
RBC: 4.34 MIL/uL (ref 4.22–5.81)
RDW: 16.8 % — ABNORMAL HIGH (ref 11.5–15.5)
WBC Count: 2.4 K/uL — ABNORMAL LOW (ref 4.0–10.5)
nRBC: 0 % (ref 0.0–0.2)

## 2023-10-20 LAB — RAD ONC ARIA SESSION SUMMARY
Course Elapsed Days: 13
Plan Fractions Treated to Date: 10
Plan Prescribed Dose Per Fraction: 2 Gy
Plan Total Fractions Prescribed: 30
Plan Total Prescribed Dose: 60 Gy
Reference Point Dosage Given to Date: 20 Gy
Reference Point Session Dosage Given: 2 Gy
Session Number: 10

## 2023-10-20 LAB — CMP (CANCER CENTER ONLY)
ALT: 17 U/L (ref 0–44)
AST: 14 U/L — ABNORMAL LOW (ref 15–41)
Albumin: 4.1 g/dL (ref 3.5–5.0)
Alkaline Phosphatase: 62 U/L (ref 38–126)
Anion gap: 6 (ref 5–15)
BUN: 9 mg/dL (ref 8–23)
CO2: 28 mmol/L (ref 22–32)
Calcium: 9.7 mg/dL (ref 8.9–10.3)
Chloride: 106 mmol/L (ref 98–111)
Creatinine: 0.77 mg/dL (ref 0.61–1.24)
GFR, Estimated: >60 mL/min (ref >60)
Glucose, Bld: 90 mg/dL (ref 70–99)
Potassium: 4.3 mmol/L (ref 3.5–5.1)
Sodium: 140 mmol/L (ref 135–145)
Total Bilirubin: 1.3 mg/dL — ABNORMAL HIGH (ref 0.0–1.2)
Total Protein: 6.7 g/dL (ref 6.5–8.1)

## 2023-10-20 MED ORDER — FAMOTIDINE IN NACL 20-0.9 MG/50ML-% IV SOLN
20.0000 mg | Freq: Once | INTRAVENOUS | Status: AC
  Administered 2023-10-20: 20 mg via INTRAVENOUS
  Filled 2023-10-20: qty 50

## 2023-10-20 MED ORDER — SODIUM CHLORIDE 0.9 % IV SOLN
45.0000 mg/m2 | Freq: Once | INTRAVENOUS | Status: AC
  Administered 2023-10-20: 84 mg via INTRAVENOUS
  Filled 2023-10-20: qty 14

## 2023-10-20 MED ORDER — DIPHENHYDRAMINE HCL 50 MG/ML IJ SOLN
50.0000 mg | Freq: Once | INTRAMUSCULAR | Status: AC
  Administered 2023-10-20: 50 mg via INTRAVENOUS
  Filled 2023-10-20: qty 1

## 2023-10-20 MED ORDER — SODIUM CHLORIDE 0.9 % IV SOLN: INTRAVENOUS | Status: DC

## 2023-10-20 MED ORDER — SODIUM CHLORIDE 0.9 % IV SOLN
172.4000 mg | Freq: Once | INTRAVENOUS | Status: AC
  Administered 2023-10-20: 170 mg via INTRAVENOUS
  Filled 2023-10-20: qty 17

## 2023-10-20 MED ORDER — PALONOSETRON HCL INJECTION 0.25 MG/5ML
0.2500 mg | Freq: Once | INTRAVENOUS | Status: AC
  Administered 2023-10-20: 0.25 mg via INTRAVENOUS
  Filled 2023-10-20: qty 5

## 2023-10-20 MED ORDER — DEXAMETHASONE SODIUM PHOSPHATE 10 MG/ML IJ SOLN
10.0000 mg | Freq: Once | INTRAMUSCULAR | Status: AC
  Administered 2023-10-20: 10 mg via INTRAVENOUS
  Filled 2023-10-20: qty 1

## 2023-10-21 ENCOUNTER — Ambulatory Visit
Admission: RE | Admit: 2023-10-21 | Discharge: 2023-10-21 | Disposition: A | Discharge status: Home / Self Care | Source: Ambulatory Visit | Attending: Radiation Oncology | Admitting: Radiation Oncology

## 2023-10-21 ENCOUNTER — Other Ambulatory Visit: Payer: Self-pay

## 2023-10-21 DIAGNOSIS — C3431 Malignant neoplasm of lower lobe, right bronchus or lung: Secondary | ICD-10-CM | POA: Diagnosis not present

## 2023-10-21 LAB — RAD ONC ARIA SESSION SUMMARY
Course Elapsed Days: 14
Plan Fractions Treated to Date: 11
Plan Prescribed Dose Per Fraction: 2 Gy
Plan Total Fractions Prescribed: 30
Plan Total Prescribed Dose: 60 Gy
Reference Point Dosage Given to Date: 22 Gy
Reference Point Session Dosage Given: 2 Gy
Session Number: 11

## 2023-10-22 ENCOUNTER — Ambulatory Visit
Admission: RE | Admit: 2023-10-22 | Discharge: 2023-10-22 | Disposition: A | Discharge status: Home / Self Care | Source: Ambulatory Visit | Attending: Radiation Oncology | Admitting: Radiation Oncology

## 2023-10-22 ENCOUNTER — Other Ambulatory Visit: Payer: Self-pay

## 2023-10-22 DIAGNOSIS — C3431 Malignant neoplasm of lower lobe, right bronchus or lung: Secondary | ICD-10-CM | POA: Diagnosis not present

## 2023-10-22 DIAGNOSIS — Z87891 Personal history of nicotine dependence: Secondary | ICD-10-CM | POA: Diagnosis not present

## 2023-10-22 LAB — RAD ONC ARIA SESSION SUMMARY
Course Elapsed Days: 15
Plan Fractions Treated to Date: 12
Plan Prescribed Dose Per Fraction: 2 Gy
Plan Total Fractions Prescribed: 30
Plan Total Prescribed Dose: 60 Gy
Reference Point Dosage Given to Date: 24 Gy
Reference Point Session Dosage Given: 2 Gy
Session Number: 12

## 2023-10-23 ENCOUNTER — Ambulatory Visit
Admission: RE | Admit: 2023-10-23 | Discharge: 2023-10-23 | Disposition: A | Discharge status: Home / Self Care | Source: Ambulatory Visit | Attending: Radiation Oncology | Admitting: Radiation Oncology

## 2023-10-23 ENCOUNTER — Other Ambulatory Visit: Payer: Self-pay

## 2023-10-23 ENCOUNTER — Other Ambulatory Visit (HOSPITAL_COMMUNITY): Payer: Self-pay

## 2023-10-23 DIAGNOSIS — C3431 Malignant neoplasm of lower lobe, right bronchus or lung: Secondary | ICD-10-CM | POA: Diagnosis not present

## 2023-10-23 LAB — RAD ONC ARIA SESSION SUMMARY
Course Elapsed Days: 16
Plan Fractions Treated to Date: 13
Plan Prescribed Dose Per Fraction: 2 Gy
Plan Total Fractions Prescribed: 30
Plan Total Prescribed Dose: 60 Gy
Reference Point Dosage Given to Date: 26 Gy
Reference Point Session Dosage Given: 2 Gy
Session Number: 13

## 2023-10-24 ENCOUNTER — Other Ambulatory Visit: Payer: Self-pay

## 2023-10-24 ENCOUNTER — Ambulatory Visit
Admission: RE | Admit: 2023-10-24 | Discharge: 2023-10-24 | Disposition: A | Discharge status: Home / Self Care | Source: Ambulatory Visit | Attending: Radiation Oncology | Admitting: Radiation Oncology

## 2023-10-24 DIAGNOSIS — Z87891 Personal history of nicotine dependence: Secondary | ICD-10-CM | POA: Diagnosis not present

## 2023-10-24 DIAGNOSIS — R32 Unspecified urinary incontinence: Secondary | ICD-10-CM | POA: Diagnosis not present

## 2023-10-24 DIAGNOSIS — I10 Essential (primary) hypertension: Secondary | ICD-10-CM | POA: Diagnosis not present

## 2023-10-24 DIAGNOSIS — F1010 Alcohol abuse, uncomplicated: Secondary | ICD-10-CM | POA: Diagnosis not present

## 2023-10-24 DIAGNOSIS — E785 Hyperlipidemia, unspecified: Secondary | ICD-10-CM | POA: Diagnosis not present

## 2023-10-24 DIAGNOSIS — K219 Gastro-esophageal reflux disease without esophagitis: Secondary | ICD-10-CM | POA: Diagnosis not present

## 2023-10-24 DIAGNOSIS — Z483 Aftercare following surgery for neoplasm: Secondary | ICD-10-CM | POA: Diagnosis not present

## 2023-10-24 DIAGNOSIS — D45 Polycythemia vera: Secondary | ICD-10-CM | POA: Diagnosis not present

## 2023-10-24 DIAGNOSIS — E039 Hypothyroidism, unspecified: Secondary | ICD-10-CM | POA: Diagnosis not present

## 2023-10-24 DIAGNOSIS — R911 Solitary pulmonary nodule: Secondary | ICD-10-CM | POA: Diagnosis not present

## 2023-10-24 DIAGNOSIS — C3431 Malignant neoplasm of lower lobe, right bronchus or lung: Secondary | ICD-10-CM | POA: Diagnosis not present

## 2023-10-24 LAB — RAD ONC ARIA SESSION SUMMARY
Course Elapsed Days: 17
Plan Fractions Treated to Date: 14
Plan Prescribed Dose Per Fraction: 2 Gy
Plan Total Fractions Prescribed: 30
Plan Total Prescribed Dose: 60 Gy
Reference Point Dosage Given to Date: 28 Gy
Reference Point Session Dosage Given: 2 Gy
Session Number: 14

## 2023-10-24 NOTE — Progress Notes (Signed)
  Radiation Oncology         [phone] ________________________________  Name: Noah King MRN: [id number]  Date of Service: 10/27/2023  DOB: 15-Jun-1945  Post Treatment Telephone Note  Diagnosis:  Stage IV, [id number], NSCLC, NOS of the RLL with solitary brain metastasis.  First Treatment Date: 2023-09-19 Last Treatment Date: 2023-09-24   Plan Name: Brain_SRT Site: Brain POSTOP SRS [id number]  Technique: SBRT/SRT-IMRT Mode: Photon Dose Per Fraction: 9 Gy Prescribed Dose (Delivered / Prescribed): 27 Gy / 27 Gy Prescribed Fxs (Delivered / Prescribed): 3 / 3  The patient was available for call today.  The patient did note fatigue during radiation. The patient did note hair loss but no skin changes in the field of radiation during therapy. The patient is not taking dexamethasone . The patient does not have symptoms of  weakness or loss of control of the extremities. The patient does not have symptoms of headache. The patient does not have symptoms of seizure or uncontrolled movement. The patient does not have symptoms of changes in vision. The patient does not have changes in speech. The patient does not have confusion, but notes that sometimes it takes longer to form certain words.  The patient was counseled that he  will be contacted by our brain and spine navigator to schedule surveillance imaging. The patient was encouraged to call if  he  have not received a call to schedule imaging, or if he  develop concerns or questions regarding radiation. The patient will also continue to follow up with Dr. Sherrod in medical oncology.

## 2023-10-27 ENCOUNTER — Ambulatory Visit
Admission: RE | Admit: 2023-10-27 | Discharge: 2023-10-27 | Disposition: A | Discharge status: Home / Self Care | Source: Ambulatory Visit | Attending: Radiation Oncology | Admitting: Radiation Oncology

## 2023-10-27 ENCOUNTER — Inpatient Hospital Stay

## 2023-10-27 ENCOUNTER — Inpatient Hospital Stay (HOSPITAL_BASED_OUTPATIENT_CLINIC_OR_DEPARTMENT_OTHER): Admitting: Internal Medicine

## 2023-10-27 ENCOUNTER — Other Ambulatory Visit: Payer: Self-pay

## 2023-10-27 VITALS — BP 126/82 | HR 96 | Temp 97.8°F | Resp 17 | Ht 70.0 in | Wt 153.4 lb

## 2023-10-27 DIAGNOSIS — Z510 Encounter for antineoplastic radiation therapy: Secondary | ICD-10-CM | POA: Diagnosis not present

## 2023-10-27 DIAGNOSIS — C3431 Malignant neoplasm of lower lobe, right bronchus or lung: Secondary | ICD-10-CM

## 2023-10-27 DIAGNOSIS — Z5111 Encounter for antineoplastic chemotherapy: Secondary | ICD-10-CM | POA: Diagnosis not present

## 2023-10-27 DIAGNOSIS — C718 Malignant neoplasm of overlapping sites of brain: Secondary | ICD-10-CM | POA: Diagnosis not present

## 2023-10-27 DIAGNOSIS — C781 Secondary malignant neoplasm of mediastinum: Secondary | ICD-10-CM | POA: Diagnosis not present

## 2023-10-27 LAB — CMP (CANCER CENTER ONLY)
ALT: 16 U/L (ref 0–44)
AST: 15 U/L (ref 15–41)
Albumin: 4.2 g/dL (ref 3.5–5.0)
Alkaline Phosphatase: 62 U/L (ref 38–126)
Anion gap: 6 (ref 5–15)
BUN: 7 mg/dL — ABNORMAL LOW (ref 8–23)
CO2: 27 mmol/L (ref 22–32)
Calcium: 9.8 mg/dL (ref 8.9–10.3)
Chloride: 105 mmol/L (ref 98–111)
Creatinine: 0.61 mg/dL (ref 0.61–1.24)
GFR, Estimated: >60 mL/min (ref >60)
Glucose, Bld: 96 mg/dL (ref 70–99)
Potassium: 3.8 mmol/L (ref 3.5–5.1)
Sodium: 138 mmol/L (ref 135–145)
Total Bilirubin: 1.5 mg/dL — ABNORMAL HIGH (ref 0.0–1.2)
Total Protein: 6.7 g/dL (ref 6.5–8.1)

## 2023-10-27 LAB — RAD ONC ARIA SESSION SUMMARY
Course Elapsed Days: 20
Plan Fractions Treated to Date: 15
Plan Prescribed Dose Per Fraction: 2 Gy
Plan Total Fractions Prescribed: 30
Plan Total Prescribed Dose: 60 Gy
Reference Point Dosage Given to Date: 30 Gy
Reference Point Session Dosage Given: 2 Gy
Session Number: 15

## 2023-10-27 LAB — CBC WITH DIFFERENTIAL (CANCER CENTER ONLY)
Abs Immature Granulocytes: 0.02 K/uL (ref 0.00–0.07)
Basophils Absolute: 0 K/uL (ref 0.0–0.1)
Basophils Relative: 1 %
Eosinophils Absolute: 0 K/uL (ref 0.0–0.5)
Eosinophils Relative: 1 %
HCT: 34.9 % — ABNORMAL LOW (ref 39.0–52.0)
Hemoglobin: 12.1 g/dL — ABNORMAL LOW (ref 13.0–17.0)
Immature Granulocytes: 1 %
Lymphocytes Relative: 15 %
Lymphs Abs: 0.3 K/uL — ABNORMAL LOW (ref 0.7–4.0)
MCH: 28.5 pg (ref 26.0–34.0)
MCHC: 34.7 g/dL (ref 30.0–36.0)
MCV: 82.1 fL (ref 80.0–100.0)
Monocytes Absolute: 0.3 K/uL (ref 0.1–1.0)
Monocytes Relative: 14 %
Neutro Abs: 1.4 K/uL — ABNORMAL LOW (ref 1.7–7.7)
Neutrophils Relative %: 68 %
Platelet Count: 189 K/uL (ref 150–400)
RBC: 4.25 MIL/uL (ref 4.22–5.81)
RDW: 17.3 % — ABNORMAL HIGH (ref 11.5–15.5)
WBC Count: 2.1 K/uL — ABNORMAL LOW (ref 4.0–10.5)
nRBC: 0 % (ref 0.0–0.2)

## 2023-10-27 MED ORDER — SODIUM CHLORIDE 0.9 % IV SOLN
172.4000 mg | Freq: Once | INTRAVENOUS | Status: AC
  Administered 2023-10-27: 170 mg via INTRAVENOUS
  Filled 2023-10-27: qty 17

## 2023-10-27 MED ORDER — FAMOTIDINE IN NACL 20-0.9 MG/50ML-% IV SOLN
20.0000 mg | Freq: Once | INTRAVENOUS | Status: AC
  Administered 2023-10-27: 20 mg via INTRAVENOUS
  Filled 2023-10-27: qty 50

## 2023-10-27 MED ORDER — DIPHENHYDRAMINE HCL 50 MG/ML IJ SOLN
50.0000 mg | Freq: Once | INTRAMUSCULAR | Status: AC
  Administered 2023-10-27: 50 mg via INTRAVENOUS
  Filled 2023-10-27: qty 1

## 2023-10-27 MED ORDER — SODIUM CHLORIDE 0.9 % IV SOLN
45.0000 mg/m2 | Freq: Once | INTRAVENOUS | Status: AC
  Administered 2023-10-27: 84 mg via INTRAVENOUS
  Filled 2023-10-27: qty 14

## 2023-10-27 MED ORDER — SODIUM CHLORIDE 0.9 % IV SOLN: INTRAVENOUS | Status: DC

## 2023-10-27 MED ORDER — DEXAMETHASONE SOD PHOSPHATE PF 10 MG/ML IJ SOLN
10.0000 mg | Freq: Once | INTRAMUSCULAR | Status: AC
  Administered 2023-10-27: 10 mg via INTRAVENOUS

## 2023-10-27 MED ORDER — PALONOSETRON HCL INJECTION 0.25 MG/5ML
0.2500 mg | Freq: Once | INTRAVENOUS | Status: AC
  Administered 2023-10-27: 0.25 mg via INTRAVENOUS
  Filled 2023-10-27: qty 5

## 2023-10-27 NOTE — Progress Notes (Signed)
 [facility] [phone]   Fax:([phone]  OFFICE PROGRESS NOTE  Merna Huxley, NP [address] [place] [address]  DIAGNOSIS:  stage IV (T1a, N2b, M1b) non-small cell lung cancer, adenocarcinoma presented with right lower lobe lung nodule in addition to right hilar and mediastinal lymphadenopathy and metastatic brain lesions diagnosed in August 2025.  The patient also has smaller pulmonary nodules in the right and left lung.  Molecular studies by foundation 1 showed no actionable mutations and negative PD-L1 expression.  PRIOR THERAPY: Status post left craniotomy with resection of the left frontoparietal brain tumor followed by [facility].   CURRENT THERAPY: A course of concurrent chemoradiation with weekly carboplatin  for AUC of 2 and paclitaxel  45 MGs/M2.  Status post 3 cycles.  INTERVAL HISTORY: Noah King 78 y.o. male returns to the clinic today for follow-up visit accompanied by his daughter.Discussed the use of AI scribe software for clinical note transcription with the patient, who gave verbal consent to proceed.  History of Present Illness Noah King is a 78 year old male with stage four non-small cell lung cancer who presents for evaluation before starting cycle number four of chemoradiation. He is accompanied by his daughter.  He was diagnosed with stage four non-small cell lung cancer, adenocarcinoma, in August 2025. He underwent a left craniotomy with resection of a left frontoparietal brain tumor followed by stereotactic radiosurgery.  He is currently undergoing concurrent chemoradiation with weekly carboplatin  and paclitaxel  for locally advanced disease in the right lung. He has completed three cycles and is here for evaluation before starting cycle number four.  He reports feeling 'kind of tired, low energy' but has no other complaints or side effects from the treatment. No vomiting or swallowing issues.       MEDICAL HISTORY: Past Medical History:  Diagnosis Date   Allergy    Cataract    left  extraction- right eye forming   Dupuytren's contracture    GERD (gastroesophageal reflux disease)    Hiatal hernia    past hx-  suspect at one time   Hypertension    Polycythemia     ALLERGIES:  is allergic to lotensin  [benazepril  hcl].  MEDICATIONS:  Current Outpatient Medications  Medication Sig Dispense Refill   atorvastatin  (LIPITOR) 20 MG tablet Take 1 tablet (20 mg total) by mouth daily. 90 tablet 3   dexamethasone  (DECADRON ) 2 MG tablet Take by mouth.     docusate sodium  (COLACE) 100 MG capsule Take 100 mg by mouth daily as needed (constipation).     folic acid  (FOLVITE ) 1 MG tablet Take 1 tablet (1 mg total) by mouth daily. 30 tablet 0   HYDROcodone -acetaminophen  (NORCO/VICODIN) 5-325 MG tablet Take by mouth.     levETIRAcetam  (KEPPRA ) 500 MG tablet Take 500 mg by mouth 2 (two) times daily.     lidocaine -prilocaine  (EMLA ) cream Apply to affected area once 30 g 3   losartan  (COZAAR ) 25 MG tablet Take 1 tablet (25 mg total) by mouth daily. 90 tablet 3   Multiple Vitamins-Minerals (MENS 50+ MULTIVITAMIN) TABS Take 1 tablet by mouth daily.     omeprazole  (PRILOSEC) 20 MG capsule Take 20 mg by mouth daily.     ondansetron  (ZOFRAN ) 8 MG tablet Take 1 tablet (8 mg total) by mouth every 8 (eight) hours as needed for nausea or vomiting. Start on the third day after chemotherapy. 30 tablet 1   pantoprazole  (PROTONIX ) 40 MG tablet Take 1 tablet (  40 mg total) by mouth daily. 90 tablet 1   polyethylene glycol powder (GLYCOLAX /MIRALAX ) 17 GM/SCOOP powder Take 17 g by mouth daily as needed for mild constipation. 238 g 0   prochlorperazine  (COMPAZINE ) 10 MG tablet Take 1 tablet (10 mg total) by mouth every 6 (six) hours as needed for nausea or vomiting. 30 tablet 1   thiamine  (VITAMIN B1) 100 MG tablet Take 1 tablet (100 mg total) by mouth daily. 30 tablet 0   verapamil  (CALAN -SR) 180 MG CR tablet Take  180 mg by mouth daily.     No current facility-administered medications for this visit.    SURGICAL HISTORY:  Past Surgical History:  Procedure Laterality Date   APPLICATION OF CRANIAL NAVIGATION Left 08/21/2023   Procedure: COMPUTER-ASSISTED NAVIGATION, FOR CRANIAL PROCEDURE;  Surgeon: Debby Dorn MATSU, MD;  Location: [facility] OR;  Service: Neurosurgery;  Laterality: Left;   cataract extraction left eye     COLONOSCOPY     CRANIOTOMY Left 08/21/2023   Procedure: CRANIOTOMY TUMOR EXCISION;  Surgeon: Debby Dorn MATSU, MD;  Location[facility] OR;  Service: Neurosurgery;  Laterality: Left;  LT CRANI FOR TUMOR RESECTION   DUPUYTREN CONTRACTURE RELEASE Left    HERNIA REPAIR  2024   POLYPECTOMY     rt knee arthroscopic     VIDEO BRONCHOSCOPY WITH ENDOBRONCHIAL ULTRASOUND Right 08/13/2023   Procedure: BRONCHOSCOPY, WITH EBUS;  Surgeon: Jude Harden GAILS, MD;  Location: [facility]COPY;  Service: Cardiopulmonary;  Laterality: Right;    REVIEW OF SYSTEMS:  A comprehensive review of systems was negative except for: Constitutional: positive for fatigue   PHYSICAL EXAMINATION: General appearance: alert, cooperative, fatigued, and no distress Head: Normocephalic, without obvious abnormality, atraumatic Neck: no adenopathy, no JVD, supple, symmetrical, trachea midline, and thyroid  not enlarged, symmetric, no tenderness/mass/nodules Lymph nodes: Cervical, supraclavicular, and axillary nodes normal. Resp: clear to auscultation bilaterally Back: symmetric, no curvature. ROM normal. No CVA tenderness. Cardio: regular rate and rhythm, S1, S2 normal, no murmur, click, rub or gallop GI: soft, non-tender; bowel sounds normal; no masses,  no organomegaly Extremities: extremities normal, atraumatic, no cyanosis or edema  ECOG PERFORMANCE STATUS: 1 - Symptomatic but completely ambulatory  Blood pressure 126/82, pulse 96, temperature 97.8 F (36.6 C), resp. rate 17, height 5' 10 (1.778 m), weight 153 lb 6.4 oz (69.6  kg), SpO2 97%.  LABORATORY DATA: Lab Results  Component Value Date   WBC 2.1 (L) 10/27/2023   HGB 12.1 (L) 10/27/2023   HCT 34.9 (L) 10/27/2023   MCV 82.1 10/27/2023   PLT 189 10/27/2023      Chemistry      Component Value Date/Time   NA 140 10/20/2023 1227   K 4.3 10/20/2023 1227   CL 106 10/20/2023 1227   CO2 28 10/20/2023 1227   BUN 9 10/20/2023 1227   CREATININE 0.77 10/20/2023 1227   CREATININE 0.78 12/08/2019 0819      Component Value Date/Time   CALCIUM  9.7 10/20/2023 1227   ALKPHOS 62 10/20/2023 1227   AST 14 (L) 10/20/2023 1227   ALT 17 10/20/2023 1227   BILITOT 1.3 (H) 10/20/2023 1227       RADIOGRAPHIC STUDIES: No results found.   ASSESSMENT AND PLAN: Stage IV (T1a, N2b, M1b) non-small cell lung cancer, adenocarcinoma presented with right lower lobe lung nodule in addition to right hilar and mediastinal lymphadenopathy and metastatic brain lesions diagnosed in August 2025.  Status post left craniotomy with resection of the left frontoparietal brain tumor followed by [facility].  The patient also has smaller pulmonary nodules in the right and left lung. Molecular studies by foundation 1 showed no actionable mutations and negative PD-L1 expression. The patient is currently undergoing a course of concurrent chemoradiation with weekly carboplatin  for AUC of 2 and paclitaxel  45 MGs/M2.  Status post 3 cycles. He has been tolerating this treatment well with no concerning adverse effects. Assessment and Plan Assessment & Plan Stage IV non-small cell lung cancer with brain metastasis Stage IV non-small cell lung cancer, adenocarcinoma, diagnosed in August 2025. Status post left craniotomy with resection of left frontoparietal brain tumor followed by [facility]. Currently undergoing concurrent chemoradiation with weekly carboplatin  and paclitaxel  for locally advanced disease in the right lung. No significant side effects reported from treatment. - Administer cycle 4 of carboplatin  and  paclitaxel  chemotherapy - Order scan to evaluate treatment response after the next cycle  Leukopenia secondary to chemotherapy White blood count is slightly low but adequate for treatment continuation. - Monitor white blood cell count closely - Consider skipping chemotherapy if white blood cell count decreases further  Fatigue secondary to cancer treatment Experiencing fatigue, which is a common side effect of ongoing cancer treatment. No other significant symptoms reported. The patient was advised to call immediately if he has any concerning symptoms in the interval.  The patient voices understanding of current disease status and treatment options and is in agreement with the current care plan.  All questions were answered. The patient knows to call the clinic with any problems, questions or concerns. We can certainly see the patient much sooner if necessary.   Disclaimer: This note was dictated with voice recognition software. Similar sounding words can inadvertently be transcribed and may not be corrected upon review.

## 2023-10-27 NOTE — Progress Notes (Signed)
 Okay to treat with ANC 1.4 per Dr. Sherrod.  Harlene Nasuti, PharmD Oncology Infusion Pharmacist 10/27/2023 2:59 PM

## 2023-10-27 NOTE — Patient Instructions (Signed)
 [facility] - A DEPT OF [place]  Discharge Instructions: Thank you for choosing [facility] to provide your oncology and hematology care.   If you have a lab appointment with the Cancer Center, please go directly to the Cancer Center and check in at the registration area.   Wear comfortable clothing and clothing appropriate for easy access to any Portacath or PICC line.   We strive to give you quality time with your provider. You may need to reschedule your appointment if you arrive late (15 or more minutes).  Arriving late affects you and other patients whose appointments are after yours.  Also, if you miss three or more appointments without notifying the office, you may be dismissed from the clinic at the provider's discretion.      For prescription refill requests, have your pharmacy contact our office and allow 72 hours for refills to be completed.    Today you received the following chemotherapy and/or immunotherapy agents paclitaxel , carboplatin       To help prevent nausea and vomiting after your treatment, we encourage you to take your nausea medication as directed.  BELOW ARE SYMPTOMS THAT SHOULD BE REPORTED IMMEDIATELY: *FEVER GREATER THAN 100.4 F (38 C) OR HIGHER *CHILLS OR SWEATING *NAUSEA AND VOMITING THAT IS NOT CONTROLLED WITH YOUR NAUSEA MEDICATION *UNUSUAL SHORTNESS OF BREATH *UNUSUAL BRUISING OR BLEEDING *URINARY PROBLEMS (pain or burning when urinating, or frequent urination) *BOWEL PROBLEMS (unusual diarrhea, constipation, pain near the anus) TENDERNESS IN MOUTH AND THROAT WITH OR WITHOUT PRESENCE OF ULCERS (sore throat, sores in mouth, or a toothache) UNUSUAL RASH, SWELLING OR PAIN  UNUSUAL VAGINAL DISCHARGE OR ITCHING   Items with * indicate a potential emergency and should be followed up as soon as possible or go to the Emergency Department if any problems should occur.  Please show the CHEMOTHERAPY ALERT CARD or  IMMUNOTHERAPY ALERT CARD at check-in to the Emergency Department and triage nurse.  Should you have questions after your visit or need to cancel or reschedule your appointment, please contact [facility] - A DEPT [facility]  Dept: [phone]  and follow the prompts.  Office hours are 8:00 a.m. to 4:30 p.m. Monday - Friday. Please note that voicemails left after 4:00 p.m. may not be returned until the following business day.  We are closed weekends and major holidays. You have access to a nurse at all times for urgent questions. Please call the main number to the clinic Dept: [phone] and follow the prompts.   For any non-urgent questions, you may also contact your provider using MyChart. We now offer e-Visits for anyone 25 and older to request care online for non-urgent symptoms. For details visit [email or web address].   Also download the MyChart app! Go to the app store, search MyChart, open the app, select [place], and log in with your MyChart username and password.

## 2023-10-27 NOTE — Progress Notes (Signed)
 Per Dr. Jeannett note from today  Leukopenia secondary to chemotherapy White blood count is slightly low but adequate for treatment continuation. - Monitor white blood cell count closely - Consider skipping chemotherapy if white blood cell count decreases further

## 2023-10-28 ENCOUNTER — Other Ambulatory Visit: Payer: Self-pay

## 2023-10-28 ENCOUNTER — Ambulatory Visit
Admission: RE | Admit: 2023-10-28 | Discharge: 2023-10-28 | Disposition: A | Discharge status: Home / Self Care | Source: Ambulatory Visit | Attending: Radiation Oncology | Admitting: Radiation Oncology

## 2023-10-28 DIAGNOSIS — C3431 Malignant neoplasm of lower lobe, right bronchus or lung: Secondary | ICD-10-CM | POA: Diagnosis not present

## 2023-10-28 LAB — RAD ONC ARIA SESSION SUMMARY
Course Elapsed Days: 21
Plan Fractions Treated to Date: 16
Plan Prescribed Dose Per Fraction: 2 Gy
Plan Total Fractions Prescribed: 30
Plan Total Prescribed Dose: 60 Gy
Reference Point Dosage Given to Date: 32 Gy
Reference Point Session Dosage Given: 2 Gy
Session Number: 16

## 2023-10-29 ENCOUNTER — Ambulatory Visit
Admission: RE | Admit: 2023-10-29 | Discharge: 2023-10-29 | Disposition: A | Discharge status: Home / Self Care | Source: Ambulatory Visit | Attending: Radiation Oncology | Admitting: Radiation Oncology

## 2023-10-29 ENCOUNTER — Other Ambulatory Visit: Payer: Self-pay

## 2023-10-29 DIAGNOSIS — C3431 Malignant neoplasm of lower lobe, right bronchus or lung: Secondary | ICD-10-CM | POA: Diagnosis not present

## 2023-10-29 DIAGNOSIS — Z483 Aftercare following surgery for neoplasm: Secondary | ICD-10-CM | POA: Diagnosis not present

## 2023-10-29 DIAGNOSIS — Z87891 Personal history of nicotine dependence: Secondary | ICD-10-CM | POA: Diagnosis not present

## 2023-10-29 DIAGNOSIS — E785 Hyperlipidemia, unspecified: Secondary | ICD-10-CM | POA: Diagnosis not present

## 2023-10-29 DIAGNOSIS — R911 Solitary pulmonary nodule: Secondary | ICD-10-CM | POA: Diagnosis not present

## 2023-10-29 DIAGNOSIS — R32 Unspecified urinary incontinence: Secondary | ICD-10-CM | POA: Diagnosis not present

## 2023-10-29 DIAGNOSIS — E039 Hypothyroidism, unspecified: Secondary | ICD-10-CM | POA: Diagnosis not present

## 2023-10-29 DIAGNOSIS — I10 Essential (primary) hypertension: Secondary | ICD-10-CM | POA: Diagnosis not present

## 2023-10-29 DIAGNOSIS — D45 Polycythemia vera: Secondary | ICD-10-CM | POA: Diagnosis not present

## 2023-10-29 DIAGNOSIS — K219 Gastro-esophageal reflux disease without esophagitis: Secondary | ICD-10-CM | POA: Diagnosis not present

## 2023-10-29 DIAGNOSIS — F1010 Alcohol abuse, uncomplicated: Secondary | ICD-10-CM | POA: Diagnosis not present

## 2023-10-29 LAB — RAD ONC ARIA SESSION SUMMARY
Course Elapsed Days: 22
Plan Fractions Treated to Date: 17
Plan Prescribed Dose Per Fraction: 2 Gy
Plan Total Fractions Prescribed: 30
Plan Total Prescribed Dose: 60 Gy
Reference Point Dosage Given to Date: 34 Gy
Reference Point Session Dosage Given: 2 Gy
Session Number: 17

## 2023-10-30 ENCOUNTER — Ambulatory Visit
Admission: RE | Admit: 2023-10-30 | Discharge: 2023-10-30 | Disposition: A | Discharge status: Home / Self Care | Source: Ambulatory Visit | Attending: Radiation Oncology | Admitting: Radiation Oncology

## 2023-10-30 ENCOUNTER — Other Ambulatory Visit: Payer: Self-pay

## 2023-10-30 DIAGNOSIS — C3431 Malignant neoplasm of lower lobe, right bronchus or lung: Secondary | ICD-10-CM | POA: Diagnosis not present

## 2023-10-30 LAB — RAD ONC ARIA SESSION SUMMARY
Course Elapsed Days: 23
Plan Fractions Treated to Date: 18
Plan Prescribed Dose Per Fraction: 2 Gy
Plan Total Fractions Prescribed: 30
Plan Total Prescribed Dose: 60 Gy
Reference Point Dosage Given to Date: 36 Gy
Reference Point Session Dosage Given: 2 Gy
Session Number: 18

## 2023-10-31 ENCOUNTER — Other Ambulatory Visit: Payer: Self-pay

## 2023-10-31 ENCOUNTER — Ambulatory Visit
Admission: RE | Admit: 2023-10-31 | Discharge: 2023-10-31 | Disposition: A | Discharge status: Home / Self Care | Source: Ambulatory Visit | Attending: Radiation Oncology | Admitting: Radiation Oncology

## 2023-10-31 DIAGNOSIS — C3431 Malignant neoplasm of lower lobe, right bronchus or lung: Secondary | ICD-10-CM | POA: Diagnosis not present

## 2023-10-31 LAB — RAD ONC ARIA SESSION SUMMARY
Course Elapsed Days: 24
Plan Fractions Treated to Date: 19
Plan Prescribed Dose Per Fraction: 2 Gy
Plan Total Fractions Prescribed: 30
Plan Total Prescribed Dose: 60 Gy
Reference Point Dosage Given to Date: 38 Gy
Reference Point Session Dosage Given: 2 Gy
Session Number: 19

## 2023-11-03 ENCOUNTER — Inpatient Hospital Stay

## 2023-11-03 ENCOUNTER — Ambulatory Visit
Admission: RE | Admit: 2023-11-03 | Discharge: 2023-11-03 | Disposition: A | Discharge status: Home / Self Care | Source: Ambulatory Visit | Attending: Radiation Oncology | Admitting: Radiation Oncology

## 2023-11-03 ENCOUNTER — Other Ambulatory Visit: Payer: Self-pay

## 2023-11-03 VITALS — BP 111/76 | HR 100 | Temp 98.3°F | Resp 16 | Wt 146.4 lb

## 2023-11-03 DIAGNOSIS — Z5111 Encounter for antineoplastic chemotherapy: Secondary | ICD-10-CM | POA: Diagnosis not present

## 2023-11-03 DIAGNOSIS — C3431 Malignant neoplasm of lower lobe, right bronchus or lung: Secondary | ICD-10-CM

## 2023-11-03 LAB — CBC WITH DIFFERENTIAL (CANCER CENTER ONLY)
Abs Immature Granulocytes: 0.03 K/uL (ref 0.00–0.07)
Basophils Absolute: 0 K/uL (ref 0.0–0.1)
Basophils Relative: 1 %
Eosinophils Absolute: 0 K/uL (ref 0.0–0.5)
Eosinophils Relative: 0 %
HCT: 33.8 % — ABNORMAL LOW (ref 39.0–52.0)
Hemoglobin: 11.8 g/dL — ABNORMAL LOW (ref 13.0–17.0)
Immature Granulocytes: 1 %
Lymphocytes Relative: 11 %
Lymphs Abs: 0.3 K/uL — ABNORMAL LOW (ref 0.7–4.0)
MCH: 29.1 pg (ref 26.0–34.0)
MCHC: 34.9 g/dL (ref 30.0–36.0)
MCV: 83.5 fL (ref 80.0–100.0)
Monocytes Absolute: 0.4 K/uL (ref 0.1–1.0)
Monocytes Relative: 15 %
Neutro Abs: 1.8 K/uL (ref 1.7–7.7)
Neutrophils Relative %: 72 %
Platelet Count: 169 K/uL (ref 150–400)
RBC: 4.05 MIL/uL — ABNORMAL LOW (ref 4.22–5.81)
RDW: 17.7 % — ABNORMAL HIGH (ref 11.5–15.5)
WBC Count: 2.6 K/uL — ABNORMAL LOW (ref 4.0–10.5)
nRBC: 0 % (ref 0.0–0.2)

## 2023-11-03 LAB — CMP (CANCER CENTER ONLY)
ALT: 13 U/L (ref 0–44)
AST: 14 U/L — ABNORMAL LOW (ref 15–41)
Albumin: 3.9 g/dL (ref 3.5–5.0)
Alkaline Phosphatase: 66 U/L (ref 38–126)
Anion gap: 7 (ref 5–15)
BUN: 8 mg/dL (ref 8–23)
CO2: 27 mmol/L (ref 22–32)
Calcium: 9.7 mg/dL (ref 8.9–10.3)
Chloride: 103 mmol/L (ref 98–111)
Creatinine: 0.74 mg/dL (ref 0.61–1.24)
GFR, Estimated: >60 mL/min (ref >60)
Glucose, Bld: 105 mg/dL — ABNORMAL HIGH (ref 70–99)
Potassium: 4 mmol/L (ref 3.5–5.1)
Sodium: 137 mmol/L (ref 135–145)
Total Bilirubin: 1.2 mg/dL (ref 0.0–1.2)
Total Protein: 6.5 g/dL (ref 6.5–8.1)

## 2023-11-03 LAB — RAD ONC ARIA SESSION SUMMARY
Course Elapsed Days: 27
Plan Fractions Treated to Date: 20
Plan Prescribed Dose Per Fraction: 2 Gy
Plan Total Fractions Prescribed: 30
Plan Total Prescribed Dose: 60 Gy
Reference Point Dosage Given to Date: 40 Gy
Reference Point Session Dosage Given: 2 Gy
Session Number: 20

## 2023-11-03 MED ORDER — DEXAMETHASONE SOD PHOSPHATE PF 10 MG/ML IJ SOLN
10.0000 mg | Freq: Once | INTRAMUSCULAR | Status: AC
  Administered 2023-11-03: 10 mg via INTRAVENOUS

## 2023-11-03 MED ORDER — FAMOTIDINE IN NACL 20-0.9 MG/50ML-% IV SOLN
20.0000 mg | Freq: Once | INTRAVENOUS | Status: AC
  Administered 2023-11-03: 20 mg via INTRAVENOUS
  Filled 2023-11-03: qty 50

## 2023-11-03 MED ORDER — SODIUM CHLORIDE 0.9 % IV SOLN
45.0000 mg/m2 | Freq: Once | INTRAVENOUS | Status: AC
  Administered 2023-11-03: 84 mg via INTRAVENOUS
  Filled 2023-11-03: qty 14

## 2023-11-03 MED ORDER — SODIUM CHLORIDE 0.9 % IV SOLN: INTRAVENOUS | Status: DC

## 2023-11-03 MED ORDER — SODIUM CHLORIDE 0.9 % IV SOLN
172.4000 mg | Freq: Once | INTRAVENOUS | Status: AC
  Administered 2023-11-03: 170 mg via INTRAVENOUS
  Filled 2023-11-03: qty 17

## 2023-11-03 MED ORDER — PALONOSETRON HCL INJECTION 0.25 MG/5ML
0.2500 mg | Freq: Once | INTRAVENOUS | Status: AC
  Administered 2023-11-03: 0.25 mg via INTRAVENOUS
  Filled 2023-11-03: qty 5

## 2023-11-03 MED ORDER — DIPHENHYDRAMINE HCL 50 MG/ML IJ SOLN
50.0000 mg | Freq: Once | INTRAMUSCULAR | Status: AC
  Administered 2023-11-03: 50 mg via INTRAVENOUS
  Filled 2023-11-03: qty 1

## 2023-11-03 NOTE — Patient Instructions (Signed)
 [facility] - A DEPT OF [place]  Discharge Instructions: Thank you for choosing [facility] to provide your oncology and hematology care.   If you have a lab appointment with the Cancer Center, please go directly to the Cancer Center and check in at the registration area.   Wear comfortable clothing and clothing appropriate for easy access to any Portacath or PICC line.   We strive to give you quality time with your provider. You may need to reschedule your appointment if you arrive late (15 or more minutes).  Arriving late affects you and other patients whose appointments are after yours.  Also, if you miss three or more appointments without notifying the office, you may be dismissed from the clinic at the provider's discretion.      For prescription refill requests, have your pharmacy contact our office and allow 72 hours for refills to be completed.    Today you received the following chemotherapy and/or immunotherapy agents: CARBOplatin  (PARAPLATIN ) , PACLitaxel  (TAXOL )     To help prevent nausea and vomiting after your treatment, we encourage you to take your nausea medication as directed.  BELOW ARE SYMPTOMS THAT SHOULD BE REPORTED IMMEDIATELY: *FEVER GREATER THAN 100.4 F (38 C) OR HIGHER *CHILLS OR SWEATING *NAUSEA AND VOMITING THAT IS NOT CONTROLLED WITH YOUR NAUSEA MEDICATION *UNUSUAL SHORTNESS OF BREATH *UNUSUAL BRUISING OR BLEEDING *URINARY PROBLEMS (pain or burning when urinating, or frequent urination) *BOWEL PROBLEMS (unusual diarrhea, constipation, pain near the anus) TENDERNESS IN MOUTH AND THROAT WITH OR WITHOUT PRESENCE OF ULCERS (sore throat, sores in mouth, or a toothache) UNUSUAL RASH, SWELLING OR PAIN  UNUSUAL VAGINAL DISCHARGE OR ITCHING   Items with * indicate a potential emergency and should be followed up as soon as possible or go to the Emergency Department if any problems should occur.  Please show the  CHEMOTHERAPY ALERT CARD or IMMUNOTHERAPY ALERT CARD at check-in to the Emergency Department and triage nurse.  Should you have questions after your visit or need to cancel or reschedule your appointment, please contact [facility]  Dept: [phone]  and follow the prompts.  Office hours are 8:00 a.m. to 4:30 p.m. Monday - Friday. Please note that voicemails left after 4:00 p.m. may not be returned until the following business day.  We are closed weekends and major holidays. You have access to a nurse at all times for urgent questions. Please call the main number to the clinic Dept: [phone] and follow the prompts.   For any non-urgent questions, you may also contact your provider using MyChart. We now offer e-Visits for anyone 77 and older to request care online for non-urgent symptoms. For details visit [email or web address].   Also download the MyChart app! Go to the app store, search MyChart, open the app, select [place], and log in with your MyChart username and password.

## 2023-11-04 ENCOUNTER — Other Ambulatory Visit: Payer: Self-pay

## 2023-11-04 ENCOUNTER — Ambulatory Visit
Admission: RE | Admit: 2023-11-04 | Discharge: 2023-11-04 | Disposition: A | Discharge status: Home / Self Care | Source: Ambulatory Visit | Attending: Radiation Oncology | Admitting: Radiation Oncology

## 2023-11-04 DIAGNOSIS — C3431 Malignant neoplasm of lower lobe, right bronchus or lung: Secondary | ICD-10-CM | POA: Diagnosis not present

## 2023-11-04 LAB — RAD ONC ARIA SESSION SUMMARY
Course Elapsed Days: 28
Plan Fractions Treated to Date: 21
Plan Prescribed Dose Per Fraction: 2 Gy
Plan Total Fractions Prescribed: 30
Plan Total Prescribed Dose: 60 Gy
Reference Point Dosage Given to Date: 42 Gy
Reference Point Session Dosage Given: 2 Gy
Session Number: 21

## 2023-11-05 ENCOUNTER — Ambulatory Visit
Admission: RE | Admit: 2023-11-05 | Discharge: 2023-11-05 | Disposition: A | Discharge status: Home / Self Care | Source: Ambulatory Visit | Attending: Radiation Oncology | Admitting: Radiation Oncology

## 2023-11-05 ENCOUNTER — Other Ambulatory Visit: Payer: Self-pay

## 2023-11-05 DIAGNOSIS — C3431 Malignant neoplasm of lower lobe, right bronchus or lung: Secondary | ICD-10-CM | POA: Diagnosis not present

## 2023-11-05 DIAGNOSIS — E785 Hyperlipidemia, unspecified: Secondary | ICD-10-CM | POA: Diagnosis not present

## 2023-11-05 LAB — RAD ONC ARIA SESSION SUMMARY
Course Elapsed Days: 29
Plan Fractions Treated to Date: 22
Plan Prescribed Dose Per Fraction: 2 Gy
Plan Total Fractions Prescribed: 30
Plan Total Prescribed Dose: 60 Gy
Reference Point Dosage Given to Date: 44 Gy
Reference Point Session Dosage Given: 2 Gy
Session Number: 22

## 2023-11-06 ENCOUNTER — Other Ambulatory Visit: Payer: Self-pay

## 2023-11-06 ENCOUNTER — Ambulatory Visit
Admission: RE | Admit: 2023-11-06 | Discharge: 2023-11-06 | Disposition: A | Discharge status: Home / Self Care | Source: Ambulatory Visit | Attending: Radiation Oncology | Admitting: Radiation Oncology

## 2023-11-06 DIAGNOSIS — C3431 Malignant neoplasm of lower lobe, right bronchus or lung: Secondary | ICD-10-CM | POA: Diagnosis not present

## 2023-11-06 LAB — RAD ONC ARIA SESSION SUMMARY
Course Elapsed Days: 30
Plan Fractions Treated to Date: 23
Plan Prescribed Dose Per Fraction: 2 Gy
Plan Total Fractions Prescribed: 30
Plan Total Prescribed Dose: 60 Gy
Reference Point Dosage Given to Date: 46 Gy
Reference Point Session Dosage Given: 2 Gy
Session Number: 23

## 2023-11-07 ENCOUNTER — Other Ambulatory Visit: Payer: Self-pay

## 2023-11-07 ENCOUNTER — Ambulatory Visit
Admission: RE | Admit: 2023-11-07 | Discharge: 2023-11-07 | Disposition: A | Discharge status: Home / Self Care | Source: Ambulatory Visit | Attending: Radiation Oncology | Admitting: Radiation Oncology

## 2023-11-07 ENCOUNTER — Other Ambulatory Visit: Payer: Self-pay | Admitting: *Deleted

## 2023-11-07 DIAGNOSIS — N4289 Other specified disorders of prostate: Secondary | ICD-10-CM

## 2023-11-07 DIAGNOSIS — C3431 Malignant neoplasm of lower lobe, right bronchus or lung: Secondary | ICD-10-CM | POA: Diagnosis not present

## 2023-11-07 LAB — RAD ONC ARIA SESSION SUMMARY
Course Elapsed Days: 31
Plan Fractions Treated to Date: 24
Plan Prescribed Dose Per Fraction: 2 Gy
Plan Total Fractions Prescribed: 30
Plan Total Prescribed Dose: 60 Gy
Reference Point Dosage Given to Date: 48 Gy
Reference Point Session Dosage Given: 2 Gy
Session Number: 24

## 2023-11-10 ENCOUNTER — Other Ambulatory Visit: Payer: Self-pay

## 2023-11-10 ENCOUNTER — Inpatient Hospital Stay

## 2023-11-10 ENCOUNTER — Other Ambulatory Visit: Payer: Self-pay | Admitting: *Deleted

## 2023-11-10 ENCOUNTER — Inpatient Hospital Stay (HOSPITAL_BASED_OUTPATIENT_CLINIC_OR_DEPARTMENT_OTHER): Admitting: Internal Medicine

## 2023-11-10 ENCOUNTER — Ambulatory Visit
Admission: RE | Admit: 2023-11-10 | Discharge: 2023-11-10 | Disposition: A | Discharge status: Home / Self Care | Source: Ambulatory Visit | Attending: Radiation Oncology | Admitting: Radiation Oncology

## 2023-11-10 VITALS — BP 105/74 | HR 92 | Temp 97.6°F | Resp 17 | Ht 70.0 in | Wt 154.0 lb

## 2023-11-10 DIAGNOSIS — Z79899 Other long term (current) drug therapy: Secondary | ICD-10-CM | POA: Diagnosis not present

## 2023-11-10 DIAGNOSIS — C3431 Malignant neoplasm of lower lobe, right bronchus or lung: Secondary | ICD-10-CM

## 2023-11-10 DIAGNOSIS — N4289 Other specified disorders of prostate: Secondary | ICD-10-CM

## 2023-11-10 DIAGNOSIS — Z5111 Encounter for antineoplastic chemotherapy: Secondary | ICD-10-CM | POA: Diagnosis not present

## 2023-11-10 DIAGNOSIS — Z510 Encounter for antineoplastic radiation therapy: Secondary | ICD-10-CM | POA: Diagnosis not present

## 2023-11-10 DIAGNOSIS — C3490 Malignant neoplasm of unspecified part of unspecified bronchus or lung: Secondary | ICD-10-CM | POA: Diagnosis not present

## 2023-11-10 DIAGNOSIS — Z9889 Other specified postprocedural states: Secondary | ICD-10-CM | POA: Diagnosis not present

## 2023-11-10 LAB — RAD ONC ARIA SESSION SUMMARY
Course Elapsed Days: 34
Plan Fractions Treated to Date: 25
Plan Prescribed Dose Per Fraction: 2 Gy
Plan Total Fractions Prescribed: 30
Plan Total Prescribed Dose: 60 Gy
Reference Point Dosage Given to Date: 50 Gy
Reference Point Session Dosage Given: 2 Gy
Session Number: 25

## 2023-11-10 LAB — CBC WITH DIFFERENTIAL (CANCER CENTER ONLY)
Abs Immature Granulocytes: 0.01 K/uL (ref 0.00–0.07)
Basophils Absolute: 0 K/uL (ref 0.0–0.1)
Basophils Relative: 1 %
Eosinophils Absolute: 0 K/uL (ref 0.0–0.5)
Eosinophils Relative: 1 %
HCT: 31.9 % — ABNORMAL LOW (ref 39.0–52.0)
Hemoglobin: 11.3 g/dL — ABNORMAL LOW (ref 13.0–17.0)
Immature Granulocytes: 1 %
Lymphocytes Relative: 12 %
Lymphs Abs: 0.3 K/uL — ABNORMAL LOW (ref 0.7–4.0)
MCH: 29.8 pg (ref 26.0–34.0)
MCHC: 35.4 g/dL (ref 30.0–36.0)
MCV: 84.2 fL (ref 80.0–100.0)
Monocytes Absolute: 0.2 K/uL (ref 0.1–1.0)
Monocytes Relative: 11 %
Neutro Abs: 1.6 K/uL — ABNORMAL LOW (ref 1.7–7.7)
Neutrophils Relative %: 74 %
Platelet Count: 109 K/uL — ABNORMAL LOW (ref 150–400)
RBC: 3.79 MIL/uL — ABNORMAL LOW (ref 4.22–5.81)
RDW: 18.5 % — ABNORMAL HIGH (ref 11.5–15.5)
WBC Count: 2.1 K/uL — ABNORMAL LOW (ref 4.0–10.5)
nRBC: 0 % (ref 0.0–0.2)

## 2023-11-10 LAB — CMP (CANCER CENTER ONLY)
ALT: 13 U/L (ref 0–44)
AST: 15 U/L (ref 15–41)
Albumin: 3.9 g/dL (ref 3.5–5.0)
Alkaline Phosphatase: 63 U/L (ref 38–126)
Anion gap: 7 (ref 5–15)
BUN: 9 mg/dL (ref 8–23)
CO2: 26 mmol/L (ref 22–32)
Calcium: 9.1 mg/dL (ref 8.9–10.3)
Chloride: 104 mmol/L (ref 98–111)
Creatinine: 0.78 mg/dL (ref 0.61–1.24)
GFR, Estimated: >60 mL/min (ref >60)
Glucose, Bld: 101 mg/dL — ABNORMAL HIGH (ref 70–99)
Potassium: 4 mmol/L (ref 3.5–5.1)
Sodium: 137 mmol/L (ref 135–145)
Total Bilirubin: 1.6 mg/dL — ABNORMAL HIGH (ref 0.0–1.2)
Total Protein: 6.4 g/dL — ABNORMAL LOW (ref 6.5–8.1)

## 2023-11-10 MED ORDER — DEXAMETHASONE SOD PHOSPHATE PF 10 MG/ML IJ SOLN
10.0000 mg | Freq: Once | INTRAMUSCULAR | Status: AC
  Administered 2023-11-10: 10 mg via INTRAVENOUS

## 2023-11-10 MED ORDER — PALONOSETRON HCL INJECTION 0.25 MG/5ML
0.2500 mg | Freq: Once | INTRAVENOUS | Status: AC
  Administered 2023-11-10: 0.25 mg via INTRAVENOUS
  Filled 2023-11-10: qty 5

## 2023-11-10 MED ORDER — SODIUM CHLORIDE 0.9 % IV SOLN
172.4000 mg | Freq: Once | INTRAVENOUS | Status: AC
  Administered 2023-11-10: 170 mg via INTRAVENOUS
  Filled 2023-11-10: qty 17

## 2023-11-10 MED ORDER — SODIUM CHLORIDE 0.9 % IV SOLN: INTRAVENOUS | Status: DC

## 2023-11-10 MED ORDER — FAMOTIDINE IN NACL 20-0.9 MG/50ML-% IV SOLN
20.0000 mg | Freq: Once | INTRAVENOUS | Status: AC
  Administered 2023-11-10: 20 mg via INTRAVENOUS
  Filled 2023-11-10: qty 50

## 2023-11-10 MED ORDER — DIPHENHYDRAMINE HCL 50 MG/ML IJ SOLN
50.0000 mg | Freq: Once | INTRAMUSCULAR | Status: AC
  Administered 2023-11-10: 50 mg via INTRAVENOUS
  Filled 2023-11-10: qty 1

## 2023-11-10 MED ORDER — SODIUM CHLORIDE 0.9 % IV SOLN
45.0000 mg/m2 | Freq: Once | INTRAVENOUS | Status: AC
  Administered 2023-11-10: 84 mg via INTRAVENOUS
  Filled 2023-11-10: qty 14

## 2023-11-10 NOTE — Progress Notes (Signed)
 [facility] Telephone:([phone]) [phone]   Fax:([phone]  OFFICE PROGRESS NOTE  Merna Huxley, NP [address] [place] [address]  DIAGNOSIS:  Stage IV (T1a, N2b, M1b) non-small cell lung cancer, adenocarcinoma presented with right lower lobe lung nodule in addition to right hilar and mediastinal lymphadenopathy and metastatic brain lesions diagnosed in August 2025.  The patient also has smaller pulmonary nodules in the right and left lung.  Molecular studies by foundation 1 showed no actionable mutations and negative PD-L1 expression.  PRIOR THERAPY: Status post left craniotomy with resection of the left frontoparietal brain tumor followed by [facility].   CURRENT THERAPY: A course of concurrent chemoradiation with weekly carboplatin  for AUC of 2 and paclitaxel  45 MGs/M2.  Status post 5 cycles.  INTERVAL HISTORY: Noah King 78 y.o. male returns to the clinic today for follow-up visit accompanied by his daughter.   Discussed the use of AI scribe software for clinical note transcription with the patient, who gave verbal consent to proceed.  History of Present Illness Noah King is a 78 year old male with stage four nonsmall cell lung cancer who presents for evaluation to start cycle number six of chemoradiation. He is accompanied by his daughter.  He was diagnosed with stage four nonsmall cell lung cancer, adenocarcinoma, in August 2025. The cancer has no actionable mutation and is negative for PD-L1 expression. He underwent a left craniotomy for a left frontoparietal brain tumor followed by stereotactic radiosurgery [facility]).  He is currently undergoing a course of concurrent chemoradiation with weekly carboplatin  and paclitaxel . He has completed five cycles and is here for evaluation to start cycle number six. No complaints have been noted since his last visit two weeks ago.  No nausea, vomiting, diarrhea, or headaches.     MEDICAL  HISTORY: Past Medical History:  Diagnosis Date   Allergy    Cataract    left  extraction- right eye forming   Dupuytren's contracture    GERD (gastroesophageal reflux disease)    Hiatal hernia    past hx-  suspect at one time   Hypertension    Polycythemia     ALLERGIES:  is allergic to lotensin  [benazepril  hcl].  MEDICATIONS:  Current Outpatient Medications  Medication Sig Dispense Refill   atorvastatin  (LIPITOR) 20 MG tablet Take 1 tablet (20 mg total) by mouth daily. 90 tablet 3   dexamethasone  (DECADRON ) 2 MG tablet Take by mouth.     docusate sodium  (COLACE) 100 MG capsule Take 100 mg by mouth daily as needed (constipation).     folic acid  (FOLVITE ) 1 MG tablet Take 1 tablet (1 mg total) by mouth daily. 30 tablet 0   HYDROcodone -acetaminophen  (NORCO/VICODIN) 5-325 MG tablet Take by mouth.     levETIRAcetam  (KEPPRA ) 500 MG tablet Take 500 mg by mouth 2 (two) times daily.     lidocaine -prilocaine  (EMLA ) cream Apply to affected area once 30 g 3   losartan  (COZAAR ) 25 MG tablet Take 1 tablet (25 mg total) by mouth daily. 90 tablet 3   Multiple Vitamins-Minerals (MENS 50+ MULTIVITAMIN) TABS Take 1 tablet by mouth daily.     omeprazole  (PRILOSEC) 20 MG capsule Take 20 mg by mouth daily.     ondansetron  (ZOFRAN ) 8 MG tablet Take 1 tablet (8 mg total) by mouth every 8 (eight) hours as needed for nausea or vomiting. Start on the third day after chemotherapy. 30 tablet 1   pantoprazole  (PROTONIX ) 40 MG tablet Take  1 tablet (40 mg total) by mouth daily. 90 tablet 1   polyethylene glycol powder (GLYCOLAX /MIRALAX ) 17 GM/SCOOP powder Take 17 g by mouth daily as needed for mild constipation. 238 g 0   prochlorperazine  (COMPAZINE ) 10 MG tablet Take 1 tablet (10 mg total) by mouth every 6 (six) hours as needed for nausea or vomiting. 30 tablet 1   thiamine  (VITAMIN B1) 100 MG tablet Take 1 tablet (100 mg total) by mouth daily. 30 tablet 0   verapamil  (CALAN -SR) 180 MG CR tablet Take 180 mg by  mouth daily.     No current facility-administered medications for this visit.    SURGICAL HISTORY:  Past Surgical History:  Procedure Laterality Date   APPLICATION OF CRANIAL NAVIGATION Left 08/21/2023   Procedure: COMPUTER-ASSISTED NAVIGATION, FOR CRANIAL PROCEDURE;  Surgeon: Debby Dorn MATSU, MD;  Location[facility] OR;  Service: Neurosurgery;  Laterality: Left;   cataract extraction left eye     COLONOSCOPY     CRANIOTOMY Left 08/21/2023   Procedure: CRANIOTOMY TUMOR EXCISION;  Surgeon: Debby Dorn MATSU, MD;  Location: [facility] OR;  Service: Neurosurgery;  Laterality: Left;  LT CRANI FOR TUMOR RESECTION   DUPUYTREN CONTRACTURE RELEASE Left    HERNIA REPAIR  2024   POLYPECTOMY     rt knee arthroscopic     VIDEO BRONCHOSCOPY WITH ENDOBRONCHIAL ULTRASOUND Right 08/13/2023   Procedure: BRONCHOSCOPY, WITH EBUS;  Surgeon: Jude Harden GAILS, MD;  Location[facility] ENDOSCOPY;  Service: Cardiopulmonary;  Laterality: Right;    REVIEW OF SYSTEMS:  A comprehensive review of systems was negative except for: Constitutional: positive for fatigue   PHYSICAL EXAMINATION: General appearance: alert, cooperative, fatigued, and no distress Head: Normocephalic, without obvious abnormality, atraumatic Neck: no adenopathy, no JVD, supple, symmetrical, trachea midline, and thyroid  not enlarged, symmetric, no tenderness/mass/nodules Lymph nodes: Cervical, supraclavicular, and axillary nodes normal. Resp: clear to auscultation bilaterally Back: symmetric, no curvature. ROM normal. No CVA tenderness. Cardio: regular rate and rhythm, S1, S2 normal, no murmur, click, rub or gallop GI: soft, non-tender; bowel sounds normal; no masses,  no organomegaly Extremities: extremities normal, atraumatic, no cyanosis or edema  ECOG PERFORMANCE STATUS: 1 - Symptomatic but completely ambulatory  Blood pressure 105/74, pulse 92, temperature 97.6 F (36.4 C), temperature source Temporal, resp. rate 17, height 5' 10 (1.778 m), weight  154 lb (69.9 kg), SpO2 100%.  LABORATORY DATA: Lab Results  Component Value Date   WBC 2.6 (L) 11/03/2023   HGB 11.8 (L) 11/03/2023   HCT 33.8 (L) 11/03/2023   MCV 83.5 11/03/2023   PLT 169 11/03/2023      Chemistry      Component Value Date/Time   NA 137 11/03/2023 1228   K 4.0 11/03/2023 1228   CL 103 11/03/2023 1228   CO2 27 11/03/2023 1228   BUN 8 11/03/2023 1228   CREATININE 0.74 11/03/2023 1228   CREATININE 0.78 12/08/2019 0819      Component Value Date/Time   CALCIUM  9.7 11/03/2023 1228   ALKPHOS 66 11/03/2023 1228   AST 14 (L) 11/03/2023 1228   ALT 13 11/03/2023 1228   BILITOT 1.2 11/03/2023 1228       RADIOGRAPHIC STUDIES: No results found.   ASSESSMENT AND PLAN: Stage IV (T1a, N2b, M1b) non-small cell lung cancer, adenocarcinoma presented with right lower lobe lung nodule in addition to right hilar and mediastinal lymphadenopathy and metastatic brain lesions diagnosed in August 2025.  Status post left craniotomy with resection of the left frontoparietal brain tumor followed by  [facility].  The patient also has smaller pulmonary nodules in the right and left lung. Molecular studies by foundation 1 showed no actionable mutations and negative PD-L1 expression. The patient is currently undergoing a course of concurrent chemoradiation with weekly carboplatin  for AUC of 2 and paclitaxel  45 MGs/M2.  Status post 5 cycles. He has been tolerating this treatment well with no concerning adverse effects. Assessment and Plan Assessment & Plan Stage 4 non-small cell lung cancer, adenocarcinoma of the right lung with brain metastasis Status post left craniotomy and SRS. Currently undergoing concurrent chemoradiation with weekly carboplatin  and paclitaxel . Completing last cycle of chemotherapy today and will finish radiation next Monday. No actionable mutation and negative PD-L1 expression. No complaints of nausea, vomiting, diarrhea, or headaches. - Administer last cycle of  chemotherapy today - Cancel chemotherapy next week - Complete radiation therapy next Monday - Order chest and abdominal scan in 3-4 weeks to evaluate treatment response - Discuss potential next steps, including immunotherapy, after scan results  Leukopenia secondary to chemotherapy White blood count is slightly low but acceptable to proceed with treatment. The patient was advised to call immediately if he has any concerning symptoms in the interval.  The patient voices understanding of current disease status and treatment options and is in agreement with the current care plan.  All questions were answered. The patient knows to call the clinic with any problems, questions or concerns. We can certainly see the patient much sooner if necessary.   Disclaimer: This note was dictated with voice recognition software. Similar sounding words can inadvertently be transcribed and may not be corrected upon review.

## 2023-11-10 NOTE — Progress Notes (Signed)
 Nutrition Follow-up:  Patient with stage IV non small cell lung cancer, adenocarcinoma.  Currently undergoing concurrent chemotherapy and radiation.  Followed by Dr Gatha and Dr Dewey.  Met with patient during infusion.  Reports that appetite is a little bit better.  Has been eating chicken, fish, country style steak, fruits, less vegetables than usual, drinking protein shakes and daughter mixing protein powder in smoothies. Denies nausea or trouble swallowing.      Medications: reviewed  Labs: reviewed  Anthropometrics:   Weight 154 lb  155 lb on 9/29 153 lb on 9/2   NUTRITION DIAGNOSIS: Food and nutrition related knowledge deficit improved    INTERVENTION:  Continue high calorie, high protein foods to prevent further weight loss Continue oral nutrition supplement    MONITORING, EVALUATION, GOAL: weight trends, intake   NEXT VISIT: as needed  Odella Appelhans B. Dasie SOLON, CSO, LDN Registered Dietitian [phone]

## 2023-11-10 NOTE — Patient Instructions (Signed)
 [facility] - A DEPT OF [place]  Discharge Instructions: Thank you for choosing [facility] to provide your oncology and hematology care.   If you have a lab appointment with the Cancer Center, please go directly to the Cancer Center and check in at the registration area.   Wear comfortable clothing and clothing appropriate for easy access to any Portacath or PICC line.   We strive to give you quality time with your provider. You may need to reschedule your appointment if you arrive late (15 or more minutes).  Arriving late affects you and other patients whose appointments are after yours.  Also, if you miss three or more appointments without notifying the office, you may be dismissed from the clinic at the provider's discretion.      For prescription refill requests, have your pharmacy contact our office and allow 72 hours for refills to be completed.    Today you received the following chemotherapy and/or immunotherapy agents: CARBOplatin  (PARAPLATIN ) , PACLitaxel  (TAXOL )     To help prevent nausea and vomiting after your treatment, we encourage you to take your nausea medication as directed.  BELOW ARE SYMPTOMS THAT SHOULD BE REPORTED IMMEDIATELY: *FEVER GREATER THAN 100.4 F (38 C) OR HIGHER *CHILLS OR SWEATING *NAUSEA AND VOMITING THAT IS NOT CONTROLLED WITH YOUR NAUSEA MEDICATION *UNUSUAL SHORTNESS OF BREATH *UNUSUAL BRUISING OR BLEEDING *URINARY PROBLEMS (pain or burning when urinating, or frequent urination) *BOWEL PROBLEMS (unusual diarrhea, constipation, pain near the anus) TENDERNESS IN MOUTH AND THROAT WITH OR WITHOUT PRESENCE OF ULCERS (sore throat, sores in mouth, or a toothache) UNUSUAL RASH, SWELLING OR PAIN  UNUSUAL VAGINAL DISCHARGE OR ITCHING   Items with * indicate a potential emergency and should be followed up as soon as possible or go to the Emergency Department if any problems should occur.  Please show the  CHEMOTHERAPY ALERT CARD or IMMUNOTHERAPY ALERT CARD at check-in to the Emergency Department and triage nurse.  Should you have questions after your visit or need to cancel or reschedule your appointment, please contact [facility]  Dept: [phone]  and follow the prompts.  Office hours are 8:00 a.m. to 4:30 p.m. Monday - Friday. Please note that voicemails left after 4:00 p.m. may not be returned until the following business day.  We are closed weekends and major holidays. You have access to a nurse at all times for urgent questions. Please call the main number to the clinic Dept: [phone] and follow the prompts.   For any non-urgent questions, you may also contact your provider using MyChart. We now offer e-Visits for anyone 77 and older to request care online for non-urgent symptoms. For details visit [email or web address].   Also download the MyChart app! Go to the app store, search MyChart, open the app, select [place], and log in with your MyChart username and password.

## 2023-11-11 ENCOUNTER — Other Ambulatory Visit: Payer: Self-pay

## 2023-11-11 ENCOUNTER — Ambulatory Visit
Admission: RE | Admit: 2023-11-11 | Discharge: 2023-11-11 | Disposition: A | Discharge status: Home / Self Care | Source: Ambulatory Visit | Attending: Radiation Oncology | Admitting: Radiation Oncology

## 2023-11-11 ENCOUNTER — Telehealth: Payer: Self-pay | Admitting: Internal Medicine

## 2023-11-11 DIAGNOSIS — C3431 Malignant neoplasm of lower lobe, right bronchus or lung: Secondary | ICD-10-CM | POA: Diagnosis not present

## 2023-11-11 LAB — RAD ONC ARIA SESSION SUMMARY
Course Elapsed Days: 35
Plan Fractions Treated to Date: 26
Plan Prescribed Dose Per Fraction: 2 Gy
Plan Total Fractions Prescribed: 30
Plan Total Prescribed Dose: 60 Gy
Reference Point Dosage Given to Date: 52 Gy
Reference Point Session Dosage Given: 2 Gy
Session Number: 26

## 2023-11-11 LAB — PSA, TOTAL AND FREE
PSA, Free Pct: 9.1 %
PSA, Free: 0.31 ng/mL
Prostate Specific Ag, Serum: 3.4 ng/mL (ref 0.0–4.0)

## 2023-11-11 NOTE — Telephone Encounter (Signed)
 Scheduled patient for next appointments. Called and spoke with the patients daughter, she is aware.

## 2023-11-12 ENCOUNTER — Ambulatory Visit
Admission: RE | Admit: 2023-11-12 | Discharge: 2023-11-12 | Disposition: A | Discharge status: Home / Self Care | Source: Ambulatory Visit | Attending: Radiation Oncology | Admitting: Radiation Oncology

## 2023-11-12 ENCOUNTER — Other Ambulatory Visit: Payer: Self-pay

## 2023-11-12 DIAGNOSIS — C3431 Malignant neoplasm of lower lobe, right bronchus or lung: Secondary | ICD-10-CM | POA: Diagnosis not present

## 2023-11-12 DIAGNOSIS — Z87891 Personal history of nicotine dependence: Secondary | ICD-10-CM | POA: Diagnosis not present

## 2023-11-12 LAB — RAD ONC ARIA SESSION SUMMARY
Course Elapsed Days: 36
Plan Fractions Treated to Date: 27
Plan Prescribed Dose Per Fraction: 2 Gy
Plan Total Fractions Prescribed: 30
Plan Total Prescribed Dose: 60 Gy
Reference Point Dosage Given to Date: 54 Gy
Reference Point Session Dosage Given: 2 Gy
Session Number: 27

## 2023-11-13 ENCOUNTER — Ambulatory Visit
Admission: RE | Admit: 2023-11-13 | Discharge: 2023-11-13 | Disposition: A | Discharge status: Home / Self Care | Source: Ambulatory Visit | Attending: Radiation Oncology | Admitting: Radiation Oncology

## 2023-11-13 ENCOUNTER — Other Ambulatory Visit: Payer: Self-pay

## 2023-11-13 DIAGNOSIS — C3431 Malignant neoplasm of lower lobe, right bronchus or lung: Secondary | ICD-10-CM | POA: Diagnosis not present

## 2023-11-13 LAB — RAD ONC ARIA SESSION SUMMARY
Course Elapsed Days: 37
Plan Fractions Treated to Date: 28
Plan Prescribed Dose Per Fraction: 2 Gy
Plan Total Fractions Prescribed: 30
Plan Total Prescribed Dose: 60 Gy
Reference Point Dosage Given to Date: 56 Gy
Reference Point Session Dosage Given: 2 Gy
Session Number: 28

## 2023-11-14 ENCOUNTER — Ambulatory Visit
Admission: RE | Admit: 2023-11-14 | Discharge: 2023-11-14 | Disposition: A | Discharge status: Home / Self Care | Source: Ambulatory Visit | Attending: Radiation Oncology | Admitting: Radiation Oncology

## 2023-11-14 ENCOUNTER — Other Ambulatory Visit: Payer: Self-pay

## 2023-11-14 DIAGNOSIS — C3431 Malignant neoplasm of lower lobe, right bronchus or lung: Secondary | ICD-10-CM | POA: Diagnosis not present

## 2023-11-14 LAB — RAD ONC ARIA SESSION SUMMARY
Course Elapsed Days: 38
Plan Fractions Treated to Date: 29
Plan Prescribed Dose Per Fraction: 2 Gy
Plan Total Fractions Prescribed: 30
Plan Total Prescribed Dose: 60 Gy
Reference Point Dosage Given to Date: 58 Gy
Reference Point Session Dosage Given: 2 Gy
Session Number: 29

## 2023-11-16 DIAGNOSIS — R531 Weakness: Secondary | ICD-10-CM | POA: Diagnosis not present

## 2023-11-17 ENCOUNTER — Inpatient Hospital Stay

## 2023-11-17 ENCOUNTER — Ambulatory Visit
Admission: RE | Admit: 2023-11-17 | Discharge: 2023-11-17 | Disposition: A | Discharge status: Home / Self Care | Source: Ambulatory Visit | Attending: Radiation Oncology | Admitting: Radiation Oncology

## 2023-11-17 ENCOUNTER — Other Ambulatory Visit: Payer: Self-pay

## 2023-11-17 DIAGNOSIS — C3431 Malignant neoplasm of lower lobe, right bronchus or lung: Secondary | ICD-10-CM | POA: Insufficient documentation

## 2023-11-17 DIAGNOSIS — Z510 Encounter for antineoplastic radiation therapy: Secondary | ICD-10-CM | POA: Diagnosis not present

## 2023-11-17 DIAGNOSIS — C718 Malignant neoplasm of overlapping sites of brain: Secondary | ICD-10-CM | POA: Diagnosis not present

## 2023-11-17 DIAGNOSIS — C781 Secondary malignant neoplasm of mediastinum: Secondary | ICD-10-CM | POA: Diagnosis not present

## 2023-11-17 LAB — RAD ONC ARIA SESSION SUMMARY
Course Elapsed Days: 41
Plan Fractions Treated to Date: 30
Plan Prescribed Dose Per Fraction: 2 Gy
Plan Total Fractions Prescribed: 30
Plan Total Prescribed Dose: 60 Gy
Reference Point Dosage Given to Date: 60 Gy
Reference Point Session Dosage Given: 2 Gy
Session Number: 30

## 2023-11-18 ENCOUNTER — Ambulatory Visit

## 2023-11-18 DIAGNOSIS — F1010 Alcohol abuse, uncomplicated: Secondary | ICD-10-CM | POA: Diagnosis not present

## 2023-11-18 NOTE — Radiation Completion Notes (Addendum)
  Radiation Oncology         [phone] ________________________________  Name: Noah King MRN: [id number]  Date of Service: 11/17/2023  DOB: [birth year]  End of Treatment Note    Diagnosis: Stage IV, [id number], NSCLC, NOS of the RLL with solitary brain metastasis.   Intent: Palliative     ==========DELIVERED PLANS==========  First Treatment Date: 2023-10-07 Last Treatment Date: 2023-11-17   Plan Name: Lung_R Site: Lung, Right Technique: 3D Mode: Photon Dose Per Fraction: 2 Gy Prescribed Dose (Delivered / Prescribed): 60 Gy / 60 Gy Prescribed Fxs (Delivered / Prescribed): 30 / 30     ==========ON TREATMENT VISIT DATES========== 2023-10-09, 2023-10-17, 2023-10-24, 2023-10-31, 2023-11-07, 2023-11-14    See weekly On Treatment Notes in Epic for details in the Media tab (listed as Progress notes on the On Treatment Visit Dates listed above). The patient tolerated radiation. He developed fatigue but did not have complaints of esophagitis or skin changes.   The patient will receive a call in about one month from the radiation oncology department. He will continue follow up with Dr. Buckley in the brain oncology program with Dr. Sherrod as well.      Donald KYM Husband, PAC

## 2023-11-19 ENCOUNTER — Ambulatory Visit: Admission: EM | Admit: 2023-11-19 | Discharge: 2023-11-19 | Disposition: A | Discharge status: Home / Self Care

## 2023-11-19 ENCOUNTER — Ambulatory Visit

## 2023-11-19 ENCOUNTER — Other Ambulatory Visit: Payer: Self-pay

## 2023-11-19 DIAGNOSIS — K219 Gastro-esophageal reflux disease without esophagitis: Secondary | ICD-10-CM | POA: Diagnosis not present

## 2023-11-19 DIAGNOSIS — E785 Hyperlipidemia, unspecified: Secondary | ICD-10-CM | POA: Diagnosis not present

## 2023-11-19 DIAGNOSIS — R319 Hematuria, unspecified: Secondary | ICD-10-CM

## 2023-11-19 DIAGNOSIS — D45 Polycythemia vera: Secondary | ICD-10-CM | POA: Diagnosis not present

## 2023-11-19 DIAGNOSIS — R32 Unspecified urinary incontinence: Secondary | ICD-10-CM | POA: Diagnosis not present

## 2023-11-19 DIAGNOSIS — R911 Solitary pulmonary nodule: Secondary | ICD-10-CM | POA: Diagnosis not present

## 2023-11-19 DIAGNOSIS — Z87891 Personal history of nicotine dependence: Secondary | ICD-10-CM | POA: Diagnosis not present

## 2023-11-19 DIAGNOSIS — Z483 Aftercare following surgery for neoplasm: Secondary | ICD-10-CM | POA: Diagnosis not present

## 2023-11-19 DIAGNOSIS — I10 Essential (primary) hypertension: Secondary | ICD-10-CM | POA: Diagnosis not present

## 2023-11-19 DIAGNOSIS — F1010 Alcohol abuse, uncomplicated: Secondary | ICD-10-CM | POA: Diagnosis not present

## 2023-11-19 LAB — POCT URINE DIPSTICK
Glucose, UA: NEGATIVE mg/dL
Leukocytes, UA: NEGATIVE
Nitrite, UA: NEGATIVE
POC PROTEIN,UA: >=300 — AB
Spec Grav, UA: >=1.03 — AB (ref 1.010–1.025)
Urobilinogen, UA: 1 U/dL
pH, UA: 5.5 (ref 5.0–8.0)

## 2023-11-19 MED ORDER — PHENAZOPYRIDINE HCL 200 MG PO TABS: 200.0000 mg | ORAL_TABLET | Freq: Three times a day (TID) | ORAL | 0 refills | Status: DC

## 2023-11-19 MED ORDER — CEFUROXIME AXETIL 250 MG PO TABS: 250.0000 mg | ORAL_TABLET | Freq: Two times a day (BID) | ORAL | 0 refills | Status: AC

## 2023-11-19 NOTE — Discharge Instructions (Signed)
  1. Hematuria, unspecified type (Primary) - POCT URINE DIPSTICK shows moderate blood, no leukocytes, no nitrite, these findings are not strongly indicative of urinary infection but further investigation will be completed - Urine Culture collected and sent to lab for further testing results will be available in 2 to 3 days if there are any abnormal findings patient will be contacted appropriate treatment provided - phenazopyridine (PYRIDIUM) 200 MG tablet; Take 1 tablet (200 mg total) by mouth 3 (three) times daily.  Dispense: 6 tablet; Refill: 0 - cefUROXime (CEFTIN) 250 MG tablet; Take 1 tablet (250 mg total) by mouth 2 (two) times daily with a meal for 7 days for empiric treatment of findings of hematuria suggestive of possible UTI.  Dispense: 14 tablet; Refill: 0 - Ambulatory referral to Urology for follow-up evaluation if symptoms do not improve with current therapy -Continue to monitor symptoms for any change in severity if there is any escalation of current symptoms or development of new symptoms follow-up in ER for further evaluation and management.

## 2023-11-19 NOTE — ED Provider Notes (Signed)
 [facility]  Note:  This document was prepared using Dragon voice recognition software and may include unintentional dictation errors.  MRN: [id number] DOB: March 07, 1945  Subjective:   Noah King is a 78 y.o. male presenting for increased urinary frequency, urinary retention, abnormal discoloration to the urine x 4 days.  Patient denies any lower abdominal pain, fever, dysuria, flank pain at this time.  Patient reports that he is currently undergoing chemo and radiation therapy for lung cancer.  Patient denies taking any OTC medication prior to arrival in urgent care today.  Patient denies any past history of urinary tract infections or prostate hypertrophy.  No current facility-administered medications for this encounter.  Current Outpatient Medications:    cefUROXime (CEFTIN) 250 MG tablet, Take 1 tablet (250 mg total) by mouth 2 (two) times daily with a meal for 7 days., Disp: 14 tablet, Rfl: 0   phenazopyridine (PYRIDIUM) 200 MG tablet, Take 1 tablet (200 mg total) by mouth 3 (three) times daily., Disp: 6 tablet, Rfl: 0   atorvastatin  (LIPITOR) 20 MG tablet, Take 1 tablet (20 mg total) by mouth daily., Disp: 90 tablet, Rfl: 3   dexamethasone  (DECADRON ) 2 MG tablet, Take by mouth., Disp: , Rfl:    docusate sodium  (COLACE) 100 MG capsule, Take 100 mg by mouth daily as needed (constipation)., Disp: , Rfl:    folic acid  (FOLVITE ) 1 MG tablet, Take 1 tablet (1 mg total) by mouth daily., Disp: 30 tablet, Rfl: 0   HYDROcodone -acetaminophen  (NORCO/VICODIN) 5-325 MG tablet, Take by mouth., Disp: , Rfl:    levETIRAcetam  (KEPPRA ) 500 MG tablet, Take 500 mg by mouth 2 (two) times daily., Disp: , Rfl:    lidocaine -prilocaine  (EMLA ) cream, Apply to affected area once, Disp: 30 g, Rfl: 3   losartan  (COZAAR ) 25 MG tablet, Take 1 tablet (25 mg total) by mouth daily., Disp: 90 tablet, Rfl: 3   Multiple Vitamins-Minerals (MENS 50+ MULTIVITAMIN) TABS, Take 1 tablet by mouth  daily., Disp: , Rfl:    omeprazole  (PRILOSEC) 20 MG capsule, Take 20 mg by mouth daily., Disp: , Rfl:    ondansetron  (ZOFRAN ) 8 MG tablet, Take 1 tablet (8 mg total) by mouth every 8 (eight) hours as needed for nausea or vomiting. Start on the third day after chemotherapy., Disp: 30 tablet, Rfl: 1   pantoprazole  (PROTONIX ) 40 MG tablet, Take 1 tablet (40 mg total) by mouth daily., Disp: 90 tablet, Rfl: 1   polyethylene glycol powder (GLYCOLAX /MIRALAX ) 17 GM/SCOOP powder, Take 17 g by mouth daily as needed for mild constipation., Disp: 238 g, Rfl: 0   prochlorperazine  (COMPAZINE ) 10 MG tablet, Take 1 tablet (10 mg total) by mouth every 6 (six) hours as needed for nausea or vomiting., Disp: 30 tablet, Rfl: 1   thiamine  (VITAMIN B1) 100 MG tablet, Take 1 tablet (100 mg total) by mouth daily., Disp: 30 tablet, Rfl: 0   verapamil  (CALAN -SR) 180 MG CR tablet, Take 180 mg by mouth daily., Disp: , Rfl:    Allergies  Allergen Reactions   Lotensin  [Benazepril  Hcl] Cough    Past Medical History:  Diagnosis Date   Allergy    Cataract    left  extraction- right eye forming   Dupuytren's contracture    GERD (gastroesophageal reflux disease)    Hiatal hernia    past hx-  suspect at one time   Hypertension    Polycythemia      Past Surgical History:  Procedure Laterality Date   APPLICATION OF  CRANIAL NAVIGATION Left 08/21/2023   Procedure: COMPUTER-ASSISTED NAVIGATION, FOR CRANIAL PROCEDURE;  Surgeon: Debby Dorn MATSU, MD;  Location: [facility] OR;  Service: Neurosurgery;  Laterality: Left;   cataract extraction left eye     COLONOSCOPY     CRANIOTOMY Left 08/21/2023   Procedure: CRANIOTOMY TUMOR EXCISION;  Surgeon: Debby Dorn MATSU, MD;  Location: [facility] OR;  Service: Neurosurgery;  Laterality: Left;  LT CRANI FOR TUMOR RESECTION   DUPUYTREN CONTRACTURE RELEASE Left    HERNIA REPAIR  2024   POLYPECTOMY     rt knee arthroscopic     VIDEO BRONCHOSCOPY WITH ENDOBRONCHIAL ULTRASOUND Right 08/13/2023    Procedure: BRONCHOSCOPY, WITH EBUS;  Surgeon: Jude Harden GAILS, MD;  Location: [facility] ENDOSCOPY;  Service: Cardiopulmonary;  Laterality: Right;    Family History  Problem Relation Age of Onset   Stroke Mother    Colon cancer Father 8       d. 2   Breast cancer Sister    Colon polyps Neg Hx    Esophageal cancer Neg Hx     Social History   Tobacco Use   Smoking status: Former    Current packs/day: 0.00    Types: Cigarettes    Quit date: 01/14/1985    Years since quitting: 38.8   Smokeless tobacco: Never  Vaping Use   Vaping status: Never Used  Substance Use Topics   Alcohol use: Yes    Alcohol/week: 20.0 standard drinks of alcohol    Types: 20 Cans of beer per week   Drug use: No    ROS Refer to HPI for ROS details.  Objective:   Vitals: BP 111/74 (BP Location: Right Arm)   Pulse (!) 110   Temp 98.4 F (36.9 C) (Oral)   Resp 16   Ht 5' 10 (1.778 m)   Wt 154 lb 1.6 oz (69.9 kg)   SpO2 98%   BMI 22.11 kg/m   Physical Exam Vitals and nursing note reviewed.  Constitutional:      General: He is not in acute distress.    Appearance: Normal appearance. He is well-developed. He is not ill-appearing or toxic-appearing.  HENT:     Head: Normocephalic.  Cardiovascular:     Rate and Rhythm: Normal rate.  Pulmonary:     Effort: Pulmonary effort is normal. No respiratory distress.  Abdominal:     Palpations: Abdomen is soft.     Tenderness: There is no abdominal tenderness. There is no right CVA tenderness or left CVA tenderness.  Skin:    General: Skin is warm and dry.  Neurological:     General: No focal deficit present.     Mental Status: He is alert and oriented to person, place, and time.  Psychiatric:        Mood and Affect: Mood normal.        Behavior: Behavior normal.     Procedures  Results for orders placed or performed during the hospital encounter of 11/19/23 (from the past 24 hours)  POCT URINE DIPSTICK     Status: Abnormal   Collection Time:  11/19/23 10:04 AM  Result Value Ref Range   Color, UA orange (A) yellow   Clarity, UA cloudy (A) clear   Glucose, UA negative negative mg/dL   Bilirubin, UA moderate (A) negative   Ketones, POC UA trace (5) (A) negative mg/dL   Spec Grav, UA >=8.969 (A) 1.010 - 1.025   Blood, UA trace-intact (A) negative   pH, UA 5.5 5.0 -  8.0   POC PROTEIN,UA >=300 (A) negative, trace   Urobilinogen, UA 1.0 0.2 or 1.0 E.U./dL   Nitrite, UA Negative Negative   Leukocytes, UA Negative Negative    No results found.   Assessment and Plan :     Discharge Instructions       1. Hematuria, unspecified type (Primary) - POCT URINE DIPSTICK shows moderate blood, no leukocytes, no nitrite, these findings are not strongly indicative of urinary infection but further investigation will be completed - Urine Culture collected and sent to lab for further testing results will be available in 2 to 3 days if there are any abnormal findings patient will be contacted appropriate treatment provided - phenazopyridine (PYRIDIUM) 200 MG tablet; Take 1 tablet (200 mg total) by mouth 3 (three) times daily.  Dispense: 6 tablet; Refill: 0 - cefUROXime (CEFTIN) 250 MG tablet; Take 1 tablet (250 mg total) by mouth 2 (two) times daily with a meal for 7 days for empiric treatment of findings of hematuria suggestive of possible UTI.  Dispense: 14 tablet; Refill: 0 - Ambulatory referral to Urology for follow-up evaluation if symptoms do not improve with current therapy -Continue to monitor symptoms for any change in severity if there is any escalation of current symptoms or development of new symptoms follow-up in ER for further evaluation and management.      Leilanni Halvorson B Destynie Toomey   Gilmer Kaminsky, [place] B, [place] 11/19/23 1038

## 2023-11-19 NOTE — ED Triage Notes (Signed)
 Pt presents with complaints of urinary retention + frequency & an orange discoloration to urine x 4 days. Currently denies pain in triage. Denies fevers at home. Pt states he has been undergoing chemotherapy + radiation. No medications taken PTA for symptoms reported.

## 2023-11-20 ENCOUNTER — Ambulatory Visit

## 2023-11-21 ENCOUNTER — Ambulatory Visit (HOSPITAL_COMMUNITY): Payer: Self-pay

## 2023-11-21 LAB — URINE CULTURE
Culture: <10000 — AB
Special Requests: NORMAL

## 2023-11-24 DIAGNOSIS — E039 Hypothyroidism, unspecified: Secondary | ICD-10-CM | POA: Diagnosis not present

## 2023-11-24 DIAGNOSIS — K219 Gastro-esophageal reflux disease without esophagitis: Secondary | ICD-10-CM | POA: Diagnosis not present

## 2023-11-24 DIAGNOSIS — R911 Solitary pulmonary nodule: Secondary | ICD-10-CM | POA: Diagnosis not present

## 2023-11-24 DIAGNOSIS — Z87891 Personal history of nicotine dependence: Secondary | ICD-10-CM | POA: Diagnosis not present

## 2023-11-24 DIAGNOSIS — D45 Polycythemia vera: Secondary | ICD-10-CM | POA: Diagnosis not present

## 2023-11-24 DIAGNOSIS — F1010 Alcohol abuse, uncomplicated: Secondary | ICD-10-CM | POA: Diagnosis not present

## 2023-11-24 DIAGNOSIS — Z483 Aftercare following surgery for neoplasm: Secondary | ICD-10-CM | POA: Diagnosis not present

## 2023-11-24 DIAGNOSIS — E785 Hyperlipidemia, unspecified: Secondary | ICD-10-CM | POA: Diagnosis not present

## 2023-11-24 DIAGNOSIS — I10 Essential (primary) hypertension: Secondary | ICD-10-CM | POA: Diagnosis not present

## 2023-11-24 DIAGNOSIS — R32 Unspecified urinary incontinence: Secondary | ICD-10-CM | POA: Diagnosis not present

## 2023-11-26 DIAGNOSIS — Z87891 Personal history of nicotine dependence: Secondary | ICD-10-CM | POA: Diagnosis not present

## 2023-11-26 DIAGNOSIS — I10 Essential (primary) hypertension: Secondary | ICD-10-CM | POA: Diagnosis not present

## 2023-11-26 DIAGNOSIS — R911 Solitary pulmonary nodule: Secondary | ICD-10-CM | POA: Diagnosis not present

## 2023-11-26 DIAGNOSIS — E785 Hyperlipidemia, unspecified: Secondary | ICD-10-CM | POA: Diagnosis not present

## 2023-11-26 DIAGNOSIS — K219 Gastro-esophageal reflux disease without esophagitis: Secondary | ICD-10-CM | POA: Diagnosis not present

## 2023-11-26 DIAGNOSIS — R32 Unspecified urinary incontinence: Secondary | ICD-10-CM | POA: Diagnosis not present

## 2023-11-26 DIAGNOSIS — E039 Hypothyroidism, unspecified: Secondary | ICD-10-CM | POA: Diagnosis not present

## 2023-11-26 DIAGNOSIS — F1010 Alcohol abuse, uncomplicated: Secondary | ICD-10-CM | POA: Diagnosis not present

## 2023-11-26 DIAGNOSIS — D45 Polycythemia vera: Secondary | ICD-10-CM | POA: Diagnosis not present

## 2023-11-26 DIAGNOSIS — Z483 Aftercare following surgery for neoplasm: Secondary | ICD-10-CM | POA: Diagnosis not present

## 2023-11-27 ENCOUNTER — Ambulatory Visit (INDEPENDENT_AMBULATORY_CARE_PROVIDER_SITE_OTHER): Admitting: Family Medicine

## 2023-11-27 DIAGNOSIS — Z0000 Encounter for general adult medical examination without abnormal findings: Secondary | ICD-10-CM | POA: Diagnosis not present

## 2023-11-27 NOTE — Progress Notes (Signed)
 ----------------------------------------------------------------------------------------------------------------------------------------------------------------------------------------------------------------------  Because this visit was a virtual/telehealth visit, some criteria may be missing or patient reported. Any vitals not documented were not able to be obtained and vitals that have been documented are patient reported.    MEDICARE ANNUAL PREVENTIVE CARE VISIT WITH PROVIDER (Welcome to Medicare, initial annual wellness or annual wellness exam)  Virtual Visit via Video Note  I connected with Noah King on 11/27/23  by a video enabled telemedicine application and verified that I am speaking with the correct person using two identifiers.  Location patient: home Location provider:work or home office Persons participating in the virtual visit: patient, provider  Concerns and/or follow up today: detailed intake and health/risks assessment completed on flow sheets and below- please see for details. Reports feels good since finishing up chemo and radiation. Has PT once a week and does the exercises at home, does balance exercise.   How often do you have a drink containing alcohol?n How many drinks containing alcohol do you have on a typical day when you are drinking?na How often do you have six or more drinks on one occasion?na Have you ever smoked?y Quit date if applicable? 1987  How many packs a day do/did you smoke? 2 Do you use smokeless tobacco?n Do you use an illicit drugs?n Do you feel safe at home?n Last dentist visit? Sees dentist every 6 months Last eye Exam and location? Dr. Abigail, went in May   See HM section in Epic for other details of completed HM.    ROS: negative for report of fevers, unintentional weight loss, vision changes, vision loss, hearing loss or change, chest pain, sob, hemoptysis, melena, hematochezia, hematuria, falls, bleeding or bruising,  thoughts of suicide or self harm, memory loss  Patient-completed extensive health risk assessment - reviewed and discussed with the patient: See Health Risk Assessment completed with patient prior to the visit either above or in recent phone note. This was reviewed in detailed with the patient today and appropriate recommendations, orders and referrals were placed as needed per Summary below and patient instructions.   Review of Medical History: -PMH, PSH, Family History and current specialty and care providers reviewed and updated and listed below   Patient Care Team: Merna Huxley, NP as PCP - General (Family Medicine) Liane Sharyne MATSU, [facility] (Inactive) as Pharmacist (Pharmacist) Prentis Duwaine BROCKS, RN as Oncology Nurse Navigator   Past Medical History:  Diagnosis Date   Allergy    Cataract    left  extraction- right eye forming   Dupuytren's contracture    GERD (gastroesophageal reflux disease)    Hiatal hernia    past hx-  suspect at one time   Hypertension    Polycythemia     Past Surgical History:  Procedure Laterality Date   APPLICATION OF CRANIAL NAVIGATION Left 08/21/2023   Procedure: COMPUTER-ASSISTED NAVIGATION, FOR CRANIAL PROCEDURE;  Surgeon: Debby Dorn MATSU, MD;  Location: [facility] OR;  Service: Neurosurgery;  Laterality: Left;   cataract extraction left eye     COLONOSCOPY     CRANIOTOMY Left 08/21/2023   Procedure: CRANIOTOMY TUMOR EXCISION;  Surgeon: Debby Dorn MATSU, MD;  Location: [facility] OR;  Service: Neurosurgery;  Laterality: Left;  LT CRANI FOR TUMOR RESECTION   DUPUYTREN CONTRACTURE RELEASE Left    HERNIA REPAIR  2024   POLYPECTOMY     rt knee arthroscopic     VIDEO BRONCHOSCOPY WITH ENDOBRONCHIAL ULTRASOUND Right 08/13/2023   Procedure: BRONCHOSCOPY, WITH EBUS;  Surgeon: Jude Harden GAILS, MD;  Location: [facility] ENDOSCOPY;  Service: Cardiopulmonary;  Laterality: Right;    Social History   Socioeconomic History   Marital status: Single    Spouse name: Not on  file   Number of children: Not on file   Years of education: Not on file   Highest education level: Not on file  Occupational History   Not on file  Tobacco Use   Smoking status: Former    Current packs/day: 0.00    Types: Cigarettes    Quit date: 01/14/1985    Years since quitting: 38.8   Smokeless tobacco: Never  Vaping Use   Vaping status: Never Used  Substance and Sexual Activity   Alcohol use: Yes    Alcohol/week: 20.0 standard drinks of alcohol    Types: 20 Cans of beer per week   Drug use: No   Sexual activity: Yes    Partners: Female  Other Topics Concern   Not on file  Social History Narrative   Retired- He worked at [employer]   Divorced   One daughter who lives in [place]   Social Drivers of Health   Financial Resource Strain: Low Risk  (11/01/2021)   Overall Financial Resource Strain (CARDIA)    Difficulty of Paying Living Expenses: Not hard at all  Food Insecurity: No Food Insecurity (09/04/2023)   Hunger Vital Sign    Worried About Running Out of Food in the Last Year: Never true    Ran Out of Food in the Last Year: Never true  Transportation Needs: No Transportation Needs (09/04/2023)   PRAPARE - Administrator, Civil Service (Medical): No    Lack of Transportation (Non-Medical): No  Physical Activity: Insufficiently Active (11/27/2023)   Exercise Vital Sign    Days of Exercise per Week: 7 days    Minutes of Exercise per Session: 20 min  Stress: No Stress Concern Present (11/27/2023)   [employer] of Occupational Health - Occupational Stress Questionnaire    Feeling of Stress: Not at all  Social Connections: Socially Isolated (11/27/2023)   Social Connection and Isolation Panel    Frequency of Communication with Friends and Family: More than three times a week    Frequency of Social Gatherings with Friends and Family: More than three times a week    Attends Religious Services: Never    Database Administrator or Organizations: No    Attends  Banker Meetings: Never    Marital Status: Divorced  Catering Manager Violence: Not At Risk (09/04/2023)   Humiliation, Afraid, Rape, and Kick questionnaire    Fear of Current or Ex-Partner: No    Emotionally Abused: No    Physically Abused: No    Sexually Abused: No    Family History  Problem Relation Age of Onset   Stroke Mother    Colon cancer Father 93       d. 27   Breast cancer Sister    Colon polyps Neg Hx    Esophageal cancer Neg Hx     Current Outpatient Medications on File Prior to Visit  Medication Sig Dispense Refill   atorvastatin  (LIPITOR) 20 MG tablet Take 1 tablet (20 mg total) by mouth daily. 90 tablet 3   losartan  (COZAAR ) 25 MG tablet Take 1 tablet (25 mg total) by mouth daily. 90 tablet 3   Multiple Vitamins-Minerals (MENS 50+ MULTIVITAMIN) TABS Take 1 tablet by mouth daily.     pantoprazole  (PROTONIX ) 40 MG tablet Take 1 tablet (40 mg total)  by mouth daily. 90 tablet 1   docusate sodium  (COLACE) 100 MG capsule Take 100 mg by mouth daily as needed (constipation).     ondansetron  (ZOFRAN ) 8 MG tablet Take 1 tablet (8 mg total) by mouth every 8 (eight) hours as needed for nausea or vomiting. Start on the third day after chemotherapy. (Patient not taking: Reported on 11/27/2023) 30 tablet 1   phenazopyridine (PYRIDIUM) 200 MG tablet Take 1 tablet (200 mg total) by mouth 3 (three) times daily. 6 tablet 0   prochlorperazine  (COMPAZINE ) 10 MG tablet Take 1 tablet (10 mg total) by mouth every 6 (six) hours as needed for nausea or vomiting. (Patient not taking: Reported on 11/27/2023) 30 tablet 1   thiamine  (VITAMIN B1) 100 MG tablet Take 1 tablet (100 mg total) by mouth daily. (Patient not taking: Reported on 11/27/2023) 30 tablet 0   verapamil  (CALAN -SR) 180 MG CR tablet Take 180 mg by mouth daily. (Patient not taking: Reported on 11/27/2023)     No current facility-administered medications on file prior to visit.    Allergies  Allergen Reactions    Lotensin  [Benazepril  Hcl] Cough       Physical Exam Vitals requested from patient and listed below if patient had equipment and was able to obtain at home for this virtual visit: There were no vitals filed for this visit. Estimated body mass index is 22.11 kg/m as calculated from the following:   Height as of 11/19/23: 5' 10 (1.778 m).   Weight as of 11/19/23: 154 lb 1.6 oz (69.9 kg).  EKG (optional): deferred due to virtual visit  GENERAL: alert, oriented, no acute distress detected; full vision exam deferred due to pandemic and/or virtual encounter   HEENT: atraumatic, conjunttiva clear, no obvious abnormalities on inspection of external nose and ears  NECK: normal movements of the head and neck  LUNGS: on inspection no signs of respiratory distress, breathing rate appears normal, no obvious gross SOB, gasping or wheezing  CV: no obvious cyanosis  MS: moves all visible extremities without noticeable abnormality  PSYCH/NEURO: pleasant and cooperative, no obvious depression or anxiety, speech and thought processing grossly intact, Cognitive function grossly intact  Flowsheet Row Office Visit from 11/21/2022 in [facility]  PHQ-9 Total Score 3        11/27/2023    4:15 PM 11/10/2023   10:10 AM 10/27/2023    2:04 PM 10/20/2023    2:00 PM 10/13/2023   12:14 PM  Depression screen PHQ 2/9  Decreased Interest 0 0 0 0 0  Down, Depressed, Hopeless 0 0 0 0 0  PHQ - 2 Score 0 0 0 0 0       11/21/2022   12:37 PM 09/04/2023    1:24 PM 11/27/2023    3:44 PM 11/27/2023    3:53 PM 11/27/2023    4:14 PM  Fall Risk  Falls in the past year? 1 0 1    Was there an injury with Fall? 0 0 0    Fall Risk Category Calculator 2 0 2     Patient at Risk for Falls Due to History of fall(s) Impaired balance/gait;Impaired mobility  History of fall(s)   Fall risk Follow up Falls evaluation completed Falls evaluation completed  Falls evaluation completed;Education  provided Falls evaluation completed;Education provided     Patient-reported     SUMMARY AND PLAN:  Encounter for Medicare annual wellness exam   Discussed applicable health maintenance/preventive health measures and advised and  referred or ordered per patient preferences: -discussed vaccines due recs/ risks, he is considering doing at the pharmacy, advised to let us  know if he does so that we can update record Health Maintenance  Topic Date Due   DTaP/Tdap/Td (3 - Td or Tdap) 08/25/2021   COVID-19 Vaccine (8 - 2025-26 season) 09/15/2023   Influenza Vaccine  05/25/2024 (Originally 08/15/2023)   Medicare Annual Wellness (AWV)  11/26/2024   Pneumococcal Vaccine: 50+ Years  Completed   Hepatitis C Screening  Completed   Zoster Vaccines- Shingrix  Completed   Meningococcal B Vaccine  Aged Out   Colonoscopy  Discontinued     Education and counseling on the following was provided based on the above review of health and a plan/checklist for the patient, along with additional information discussed, was provided for the patient in the patient instructions :   -Provided counseling and plan for increased risk of falling if applicable per above screening. Reviewed and demonstrated safe balance exercises that can be done at home to improve balance and discussed exercise guidelines for adults with include balance exercises at least 3 days per week.  -Advised and counseled on a healthy lifestyle - including the importance of a healthy diet, regular physical activity, social connections and stress management.He is doing remarkably well considering the year he has had. He reports decent social connections despite being unable to drive himself at the moment. He exercises daily and mood is good.  -Reviewed patient's current diet. Advised and counseled on a whole foods based healthy diet. A summary of a healthy diet was provided in the Patient Instructions.  -reviewed patient's current physical activity  level and discussed exercise guidelines for adults. Discussed community resources and ideas for safe exercise at home to assist in meeting exercise guideline recommendations in a safe and healthy way.  -Advise yearly dental visits at minimum and regular eye exams   Follow up: see patient instructions   Patient Instructions  I really enjoyed getting to talk with you today! I am available on Tuesdays and Thursdays for virtual visits if you have any questions or concerns, or if I can be of any further assistance.   CHECKLIST FROM ANNUAL WELLNESS VISIT:  -Follow up (please call to schedule if not scheduled after visit):   -yearly for annual wellness visit with primary care office  Here is a list of your preventive care/health maintenance measures and the plan for each if any are due:  PLAN For any measures below that may be due:    1. Can get vaccines at the pharmacy, please let us  know if you do so that we can update record.   Health Maintenance  Topic Date Due   DTaP/Tdap/Td (3 - Td or Tdap) 08/25/2021   COVID-19 Vaccine (8 - 2025-26 season) 09/15/2023   Influenza Vaccine  05/25/2024 (Originally 08/15/2023)   Medicare Annual Wellness (AWV)  11/26/2024   Pneumococcal Vaccine: 50+ Years  Completed   Hepatitis C Screening  Completed   Zoster Vaccines- Shingrix  Completed   Meningococcal B Vaccine  Aged Out   Colonoscopy  Discontinued    -See a dentist at least yearly  -Get your eyes checked and then per your eye specialist's recommendations  -Other issues addressed today:   -I have included below further information regarding a healthy whole foods based diet, physical activity guidelines for adults, stress management and opportunities for social connections. I hope you find this information useful.    -----------------------------------------------------------------------------------------------------------------------------------------------------------------------------------------------------------------------------------------------------------    NUTRITION: -eat  real food: lots of colorful vegetables (half the plate) and fruits -5-7 servings of vegetables and fruits per day (fresh or steamed is best), exp. 2 servings of vegetables with lunch and dinner and 2 servings of fruit per day. Berries and greens such as kale and collards are great choices.  -consume on a regular basis:  fresh fruits, fresh veggies, fish, nuts, seeds, healthy oils (such as olive oil, avocado oil), whole grains (make sure for bread/pasta/crackers/etc., that the first ingredient on label contains the word whole), legumes. -can eat small amounts of dairy and lean meat (no larger than the palm of your hand), but avoid processed meats such as ham, bacon, lunch meat, etc. -drink water -try to avoid fast food and pre-packaged foods, processed meat, ultra processed foods/beverages (donuts, candy, etc.) -most experts advise limiting sodium to < 2300mg  per day, should limit further is any chronic conditions such as high blood pressure, heart disease, diabetes, etc. The American Heart Association advised that < 1500mg  is is ideal -try to avoid foods/beverages that contain any ingredients with names you do not recognize  -try to avoid foods/beverages  with added sugar or sweeteners/sweets  -try to avoid sweet drinks (including diet drinks): soda, juice, Gatorade, sweet tea, power drinks, diet drinks -try to avoid white rice, white bread, pasta (unless whole grain)  EXERCISE GUIDELINES FOR ADULTS: -if you wish to increase your physical activity, do so gradually and with the approval of your doctor -STOP and seek medical care immediately if you have any chest pain, chest discomfort or trouble breathing when starting or  increasing exercise  -move and stretch your body, legs, feet and arms when sitting for long periods -Physical activity guidelines for optimal health in adults: -get at least 150 minutes per week of moderate exercise (can talk, but not sing); this is about 20-30 minutes of sustained activity 5-7 days per week or two 10-15 minute episodes of sustained activity 5-7 days per week -do some muscle building/resistance training/strength training at least 2 days per week  -balance exercises 3+ days per week:   Stand somewhere where you have something sturdy to hold onto if you lose balance    1) lift up on toes, then back down, start with 5x per day and work up to 20x   2) stand and lift one leg straight out to the side so that foot is a few inches of the floor, start with 5x each side and work up to 20x each side   3) stand on one foot, start with 5 seconds each side and work up to 20 seconds on each side  If you need ideas or help with getting more active:  -Silver sneakers https://tools.silversneakers.com  -Walk with a Doc: Http://www.duncan-williams.com/  -try to include resistance (weight lifting/strength building) and balance exercises twice per week: or the following link for ideas: http://castillo-powell.com/  buyducts.dk  STRESS MANAGEMENT: -can try meditating, or just sitting quietly with deep breathing while intentionally relaxing all parts of your body for 5 minutes daily -if you need further help with stress, anxiety or depression please follow up with your primary doctor or contact the wonderful folks at [facility]: [phone]  SOCIAL CONNECTIONS: -options in [place] if you wish to engage in more social and exercise related activities:  -Silver sneakers https://tools.silversneakers.com  -Walk with a Doc: Http://www.duncan-williams.com/  -Check out the [facility] Active Adults 50+  section on the [place] of [employer] (hiking clubs, book clubs, cards and games, chess, exercise classes, aquatic classes  and much more) - see the website for details: https://www.[place].gov/departments/parks-recreation/active-adults50  -YouTube has lots of exercise videos for different ages and abilities as well  -[facility] (a variety of indoor and outdoor inperson activities for adults). [phone]. [address]  -Virtual Online Classes (a variety of topics): see seniorplanet.org or call [phone]  -consider volunteering at a school, hospice center, church, senior center or elsewhere            Chiquita JONELLE Cramp, DO

## 2023-11-27 NOTE — Patient Instructions (Signed)
 I really enjoyed getting to talk with you today! I am available on Tuesdays and Thursdays for virtual visits if you have any questions or concerns, or if I can be of any further assistance.   CHECKLIST FROM ANNUAL WELLNESS VISIT:  -Follow up (please call to schedule if not scheduled after visit):   -yearly for annual wellness visit with primary care office  Here is a list of your preventive care/health maintenance measures and the plan for each if any are due:  PLAN For any measures below that may be due:    1. Can get vaccines at the pharmacy, please let us  know if you do so that we can update record.   Health Maintenance  Topic Date Due   DTaP/Tdap/Td (3 - Td or Tdap) 08/25/2021   COVID-19 Vaccine (8 - 2025-26 season) 09/15/2023   Influenza Vaccine  05/25/2024 (Originally 08/15/2023)   Medicare Annual Wellness (AWV)  11/26/2024   Pneumococcal Vaccine: 50+ Years  Completed   Hepatitis C Screening  Completed   Zoster Vaccines- Shingrix  Completed   Meningococcal B Vaccine  Aged Out   Colonoscopy  Discontinued    -See a dentist at least yearly  -Get your eyes checked and then per your eye specialist's recommendations  -Other issues addressed today:   -I have included below further information regarding a healthy whole foods based diet, physical activity guidelines for adults, stress management and opportunities for social connections. I hope you find this information useful.   -----------------------------------------------------------------------------------------------------------------------------------------------------------------------------------------------------------------------------------------------------------    NUTRITION: -eat real food: lots of colorful vegetables (half the plate) and fruits -5-7 servings of vegetables and fruits per day (fresh or steamed is best), exp. 2 servings of vegetables with lunch and dinner and 2 servings of fruit per day. Berries and  greens such as kale and collards are great choices.  -consume on a regular basis:  fresh fruits, fresh veggies, fish, nuts, seeds, healthy oils (such as olive oil, avocado oil), whole grains (make sure for bread/pasta/crackers/etc., that the first ingredient on label contains the word whole), legumes. -can eat small amounts of dairy and lean meat (no larger than the palm of your hand), but avoid processed meats such as ham, bacon, lunch meat, etc. -drink water -try to avoid fast food and pre-packaged foods, processed meat, ultra processed foods/beverages (donuts, candy, etc.) -most experts advise limiting sodium to < 2300mg  per day, should limit further is any chronic conditions such as high blood pressure, heart disease, diabetes, etc. The American Heart Association advised that < 1500mg  is is ideal -try to avoid foods/beverages that contain any ingredients with names you do not recognize  -try to avoid foods/beverages  with added sugar or sweeteners/sweets  -try to avoid sweet drinks (including diet drinks): soda, juice, Gatorade, sweet tea, power drinks, diet drinks -try to avoid white rice, white bread, pasta (unless whole grain)  EXERCISE GUIDELINES FOR ADULTS: -if you wish to increase your physical activity, do so gradually and with the approval of your doctor -STOP and seek medical care immediately if you have any chest pain, chest discomfort or trouble breathing when starting or increasing exercise  -move and stretch your body, legs, feet and arms when sitting for long periods -Physical activity guidelines for optimal health in adults: -get at least 150 minutes per week of moderate exercise (can talk, but not sing); this is about 20-30 minutes of sustained activity 5-7 days per week or two 10-15 minute episodes of sustained activity 5-7 days per week -do some  muscle building/resistance training/strength training at least 2 days per week  -balance exercises 3+ days per week:   Stand  somewhere where you have something sturdy to hold onto if you lose balance    1) lift up on toes, then back down, start with 5x per day and work up to 20x   2) stand and lift one leg straight out to the side so that foot is a few inches of the floor, start with 5x each side and work up to 20x each side   3) stand on one foot, start with 5 seconds each side and work up to 20 seconds on each side  If you need ideas or help with getting more active:  -Silver sneakers https://tools.silversneakers.com  -Walk with a Doc: Http://www.duncan-williams.com/  -try to include resistance (weight lifting/strength building) and balance exercises twice per week: or the following link for ideas: http://castillo-powell.com/  buyducts.dk  STRESS MANAGEMENT: -can try meditating, or just sitting quietly with deep breathing while intentionally relaxing all parts of your body for 5 minutes daily -if you need further help with stress, anxiety or depression please follow up with your primary doctor or contact the wonderful folks at [facility]: [phone]  SOCIAL CONNECTIONS: -options in [place] if you wish to engage in more social and exercise related activities:  -Silver sneakers https://tools.silversneakers.com  -Walk with a Doc: Http://www.duncan-williams.com/  -Check out the [facility] Active Adults 50+ section on the [place] [employer] (hiking clubs, book clubs, cards and games, chess, exercise classes, aquatic classes and much more) - see the website for details: https://www.[place].gov/departments/parks-recreation/active-adults50  -YouTube has lots of exercise videos for different ages and abilities as well  -[facility] (a variety of indoor and outdoor inperson activities for adults). [phone]. [address]  -Virtual Online Classes (a variety of topics): see  seniorplanet.org or call [phone]  -consider volunteering at a school, hospice center, church, senior center or elsewhere

## 2023-12-02 DIAGNOSIS — I10 Essential (primary) hypertension: Secondary | ICD-10-CM | POA: Diagnosis not present

## 2023-12-02 DIAGNOSIS — Z483 Aftercare following surgery for neoplasm: Secondary | ICD-10-CM | POA: Diagnosis not present

## 2023-12-02 DIAGNOSIS — F1010 Alcohol abuse, uncomplicated: Secondary | ICD-10-CM | POA: Diagnosis not present

## 2023-12-02 DIAGNOSIS — K219 Gastro-esophageal reflux disease without esophagitis: Secondary | ICD-10-CM | POA: Diagnosis not present

## 2023-12-02 DIAGNOSIS — Z87891 Personal history of nicotine dependence: Secondary | ICD-10-CM | POA: Diagnosis not present

## 2023-12-02 DIAGNOSIS — E785 Hyperlipidemia, unspecified: Secondary | ICD-10-CM | POA: Diagnosis not present

## 2023-12-02 DIAGNOSIS — R911 Solitary pulmonary nodule: Secondary | ICD-10-CM | POA: Diagnosis not present

## 2023-12-02 DIAGNOSIS — D45 Polycythemia vera: Secondary | ICD-10-CM | POA: Diagnosis not present

## 2023-12-02 DIAGNOSIS — R32 Unspecified urinary incontinence: Secondary | ICD-10-CM | POA: Diagnosis not present

## 2023-12-02 DIAGNOSIS — E039 Hypothyroidism, unspecified: Secondary | ICD-10-CM | POA: Diagnosis not present

## 2023-12-03 DIAGNOSIS — F1010 Alcohol abuse, uncomplicated: Secondary | ICD-10-CM | POA: Diagnosis not present

## 2023-12-03 DIAGNOSIS — I10 Essential (primary) hypertension: Secondary | ICD-10-CM | POA: Diagnosis not present

## 2023-12-03 DIAGNOSIS — E785 Hyperlipidemia, unspecified: Secondary | ICD-10-CM | POA: Diagnosis not present

## 2023-12-03 DIAGNOSIS — R911 Solitary pulmonary nodule: Secondary | ICD-10-CM | POA: Diagnosis not present

## 2023-12-03 DIAGNOSIS — Z483 Aftercare following surgery for neoplasm: Secondary | ICD-10-CM | POA: Diagnosis not present

## 2023-12-03 DIAGNOSIS — Z87891 Personal history of nicotine dependence: Secondary | ICD-10-CM | POA: Diagnosis not present

## 2023-12-03 DIAGNOSIS — D45 Polycythemia vera: Secondary | ICD-10-CM | POA: Diagnosis not present

## 2023-12-03 DIAGNOSIS — R32 Unspecified urinary incontinence: Secondary | ICD-10-CM | POA: Diagnosis not present

## 2023-12-03 DIAGNOSIS — K219 Gastro-esophageal reflux disease without esophagitis: Secondary | ICD-10-CM | POA: Diagnosis not present

## 2023-12-03 DIAGNOSIS — E039 Hypothyroidism, unspecified: Secondary | ICD-10-CM | POA: Diagnosis not present

## 2023-12-05 NOTE — Progress Notes (Signed)
  Radiation Oncology         [phone] ________________________________  Name: Noah King MRN: [id number]  Date of Service: 12/15/2023  DOB: [birth year]  Post Treatment Telephone Note  Diagnosis:   Stage IV, [id number], NSCLC, NOS of the RLL with solitary brain metastasis.   First Treatment Date: 2023-10-07 Last Treatment Date: 2023-11-17   Plan Name: Lung_R Site: Lung, Right Technique: 3D Mode: Photon Dose Per Fraction: 2 Gy Prescribed Dose (Delivered / Prescribed): 60 Gy / 60 Gy Prescribed Fxs (Delivered / Prescribed): 30 / 30  The patient was available for call today.   Symptoms of fatigue have improved since completing therapy.  Symptoms of skin changes have improved since completing therapy.  Symptoms of esophagitis have improved since completing therapy.   The patient has scheduled follow up with his medical oncologist Dr. Buckley for ongoing care, and was encouraged to call if he develops concerns or questions regarding radiation.

## 2023-12-08 ENCOUNTER — Encounter: Payer: Self-pay | Admitting: Radiation Oncology

## 2023-12-09 ENCOUNTER — Ambulatory Visit (HOSPITAL_COMMUNITY)
Admission: RE | Admit: 2023-12-09 | Discharge: 2023-12-09 | Disposition: A | Discharge status: Home / Self Care | Source: Ambulatory Visit | Attending: Internal Medicine | Admitting: Internal Medicine

## 2023-12-09 ENCOUNTER — Inpatient Hospital Stay: Attending: Internal Medicine

## 2023-12-09 DIAGNOSIS — I700 Atherosclerosis of aorta: Secondary | ICD-10-CM | POA: Diagnosis not present

## 2023-12-09 DIAGNOSIS — C3490 Malignant neoplasm of unspecified part of unspecified bronchus or lung: Secondary | ICD-10-CM

## 2023-12-09 DIAGNOSIS — N281 Cyst of kidney, acquired: Secondary | ICD-10-CM | POA: Diagnosis not present

## 2023-12-09 DIAGNOSIS — C3431 Malignant neoplasm of lower lobe, right bronchus or lung: Secondary | ICD-10-CM | POA: Diagnosis not present

## 2023-12-09 DIAGNOSIS — C7931 Secondary malignant neoplasm of brain: Secondary | ICD-10-CM | POA: Diagnosis not present

## 2023-12-09 LAB — CBC WITH DIFFERENTIAL (CANCER CENTER ONLY)
Abs Immature Granulocytes: 0.02 K/uL (ref 0.00–0.07)
Basophils Absolute: 0 K/uL (ref 0.0–0.1)
Basophils Relative: 1 %
Eosinophils Absolute: 0.1 K/uL (ref 0.0–0.5)
Eosinophils Relative: 3 %
HCT: 35.6 % — ABNORMAL LOW (ref 39.0–52.0)
Hemoglobin: 12 g/dL — ABNORMAL LOW (ref 13.0–17.0)
Immature Granulocytes: 1 %
Lymphocytes Relative: 25 %
Lymphs Abs: 0.8 K/uL (ref 0.7–4.0)
MCH: 31.8 pg (ref 26.0–34.0)
MCHC: 33.7 g/dL (ref 30.0–36.0)
MCV: 94.4 fL (ref 80.0–100.0)
Monocytes Absolute: 0.4 K/uL (ref 0.1–1.0)
Monocytes Relative: 14 %
Neutro Abs: 1.8 K/uL (ref 1.7–7.7)
Neutrophils Relative %: 56 %
Platelet Count: 142 K/uL — ABNORMAL LOW (ref 150–400)
RBC: 3.77 MIL/uL — ABNORMAL LOW (ref 4.22–5.81)
RDW: 18.8 % — ABNORMAL HIGH (ref 11.5–15.5)
WBC Count: 3.1 K/uL — ABNORMAL LOW (ref 4.0–10.5)
nRBC: 0 % (ref 0.0–0.2)

## 2023-12-09 LAB — CMP (CANCER CENTER ONLY)
ALT: 23 U/L (ref 0–44)
AST: 23 U/L (ref 15–41)
Albumin: 4.3 g/dL (ref 3.5–5.0)
Alkaline Phosphatase: 85 U/L (ref 38–126)
Anion gap: 10 (ref 5–15)
BUN: 10 mg/dL (ref 8–23)
CO2: 27 mmol/L (ref 22–32)
Calcium: 10 mg/dL (ref 8.9–10.3)
Chloride: 104 mmol/L (ref 98–111)
Creatinine: 0.85 mg/dL (ref 0.61–1.24)
GFR, Estimated: >60 mL/min (ref >60)
Glucose, Bld: 117 mg/dL — ABNORMAL HIGH (ref 70–99)
Potassium: 4.6 mmol/L (ref 3.5–5.1)
Sodium: 140 mmol/L (ref 135–145)
Total Bilirubin: 1.2 mg/dL (ref 0.0–1.2)
Total Protein: 6.9 g/dL (ref 6.5–8.1)

## 2023-12-09 MED ORDER — SODIUM CHLORIDE (PF) 0.9 % IJ SOLN
INTRAMUSCULAR | Status: AC
  Filled 2023-12-09: qty 50

## 2023-12-09 MED ORDER — IOHEXOL 300 MG/ML  SOLN
100.0000 mL | Freq: Once | INTRAMUSCULAR | Status: AC | PRN
  Administered 2023-12-09: 100 mL via INTRAVENOUS

## 2023-12-10 DIAGNOSIS — Z87891 Personal history of nicotine dependence: Secondary | ICD-10-CM | POA: Diagnosis not present

## 2023-12-10 DIAGNOSIS — D45 Polycythemia vera: Secondary | ICD-10-CM | POA: Diagnosis not present

## 2023-12-10 DIAGNOSIS — E039 Hypothyroidism, unspecified: Secondary | ICD-10-CM | POA: Diagnosis not present

## 2023-12-10 DIAGNOSIS — K219 Gastro-esophageal reflux disease without esophagitis: Secondary | ICD-10-CM | POA: Diagnosis not present

## 2023-12-10 DIAGNOSIS — R32 Unspecified urinary incontinence: Secondary | ICD-10-CM | POA: Diagnosis not present

## 2023-12-10 DIAGNOSIS — Z483 Aftercare following surgery for neoplasm: Secondary | ICD-10-CM | POA: Diagnosis not present

## 2023-12-10 DIAGNOSIS — R911 Solitary pulmonary nodule: Secondary | ICD-10-CM | POA: Diagnosis not present

## 2023-12-10 DIAGNOSIS — E785 Hyperlipidemia, unspecified: Secondary | ICD-10-CM | POA: Diagnosis not present

## 2023-12-10 DIAGNOSIS — F1010 Alcohol abuse, uncomplicated: Secondary | ICD-10-CM | POA: Diagnosis not present

## 2023-12-10 DIAGNOSIS — I10 Essential (primary) hypertension: Secondary | ICD-10-CM | POA: Diagnosis not present

## 2023-12-12 DIAGNOSIS — Z87891 Personal history of nicotine dependence: Secondary | ICD-10-CM | POA: Diagnosis not present

## 2023-12-12 DIAGNOSIS — R911 Solitary pulmonary nodule: Secondary | ICD-10-CM | POA: Diagnosis not present

## 2023-12-12 DIAGNOSIS — E039 Hypothyroidism, unspecified: Secondary | ICD-10-CM | POA: Diagnosis not present

## 2023-12-12 DIAGNOSIS — D45 Polycythemia vera: Secondary | ICD-10-CM | POA: Diagnosis not present

## 2023-12-12 DIAGNOSIS — I10 Essential (primary) hypertension: Secondary | ICD-10-CM | POA: Diagnosis not present

## 2023-12-12 DIAGNOSIS — R32 Unspecified urinary incontinence: Secondary | ICD-10-CM | POA: Diagnosis not present

## 2023-12-12 DIAGNOSIS — Z483 Aftercare following surgery for neoplasm: Secondary | ICD-10-CM | POA: Diagnosis not present

## 2023-12-12 DIAGNOSIS — F1010 Alcohol abuse, uncomplicated: Secondary | ICD-10-CM | POA: Diagnosis not present

## 2023-12-12 DIAGNOSIS — E785 Hyperlipidemia, unspecified: Secondary | ICD-10-CM | POA: Diagnosis not present

## 2023-12-12 DIAGNOSIS — K219 Gastro-esophageal reflux disease without esophagitis: Secondary | ICD-10-CM | POA: Diagnosis not present

## 2023-12-13 ENCOUNTER — Other Ambulatory Visit: Payer: Self-pay | Admitting: Adult Health

## 2023-12-15 ENCOUNTER — Encounter: Payer: Self-pay | Admitting: Radiation Oncology

## 2023-12-15 ENCOUNTER — Ambulatory Visit
Admission: RE | Admit: 2023-12-15 | Discharge: 2023-12-15 | Disposition: A | Source: Ambulatory Visit | Attending: Radiation Oncology

## 2023-12-15 ENCOUNTER — Ambulatory Visit: Admitting: Urology

## 2023-12-15 DIAGNOSIS — C3490 Malignant neoplasm of unspecified part of unspecified bronchus or lung: Secondary | ICD-10-CM

## 2023-12-15 DIAGNOSIS — C3431 Malignant neoplasm of lower lobe, right bronchus or lung: Secondary | ICD-10-CM

## 2023-12-16 DIAGNOSIS — R531 Weakness: Secondary | ICD-10-CM | POA: Diagnosis not present

## 2023-12-17 ENCOUNTER — Inpatient Hospital Stay: Admitting: Physician Assistant

## 2023-12-17 ENCOUNTER — Encounter: Payer: Self-pay | Admitting: Radiation Oncology

## 2023-12-17 DIAGNOSIS — Z87891 Personal history of nicotine dependence: Secondary | ICD-10-CM | POA: Diagnosis not present

## 2023-12-17 DIAGNOSIS — I10 Essential (primary) hypertension: Secondary | ICD-10-CM | POA: Diagnosis not present

## 2023-12-17 DIAGNOSIS — R911 Solitary pulmonary nodule: Secondary | ICD-10-CM | POA: Diagnosis not present

## 2023-12-17 DIAGNOSIS — R32 Unspecified urinary incontinence: Secondary | ICD-10-CM | POA: Diagnosis not present

## 2023-12-17 DIAGNOSIS — D45 Polycythemia vera: Secondary | ICD-10-CM | POA: Diagnosis not present

## 2023-12-17 DIAGNOSIS — F1010 Alcohol abuse, uncomplicated: Secondary | ICD-10-CM | POA: Diagnosis not present

## 2023-12-17 DIAGNOSIS — K219 Gastro-esophageal reflux disease without esophagitis: Secondary | ICD-10-CM | POA: Diagnosis not present

## 2023-12-17 DIAGNOSIS — Z483 Aftercare following surgery for neoplasm: Secondary | ICD-10-CM | POA: Diagnosis not present

## 2023-12-17 DIAGNOSIS — E039 Hypothyroidism, unspecified: Secondary | ICD-10-CM | POA: Diagnosis not present

## 2023-12-17 DIAGNOSIS — E785 Hyperlipidemia, unspecified: Secondary | ICD-10-CM | POA: Diagnosis not present

## 2023-12-18 ENCOUNTER — Ambulatory Visit: Payer: Self-pay | Admitting: Adult Health

## 2023-12-18 ENCOUNTER — Other Ambulatory Visit: Payer: Self-pay | Admitting: Radiation Oncology

## 2023-12-18 ENCOUNTER — Ambulatory Visit: Payer: Medicare HMO | Admitting: Adult Health

## 2023-12-18 ENCOUNTER — Encounter: Payer: Self-pay | Admitting: Adult Health

## 2023-12-18 VITALS — BP 100/80 | HR 92 | Temp 98.4°F | Ht 70.0 in | Wt 153.0 lb

## 2023-12-18 DIAGNOSIS — C7931 Secondary malignant neoplasm of brain: Secondary | ICD-10-CM | POA: Diagnosis not present

## 2023-12-18 DIAGNOSIS — I739 Peripheral vascular disease, unspecified: Secondary | ICD-10-CM | POA: Diagnosis not present

## 2023-12-18 DIAGNOSIS — K219 Gastro-esophageal reflux disease without esophagitis: Secondary | ICD-10-CM

## 2023-12-18 DIAGNOSIS — E785 Hyperlipidemia, unspecified: Secondary | ICD-10-CM | POA: Diagnosis not present

## 2023-12-18 DIAGNOSIS — I10 Essential (primary) hypertension: Secondary | ICD-10-CM

## 2023-12-18 DIAGNOSIS — C3490 Malignant neoplasm of unspecified part of unspecified bronchus or lung: Secondary | ICD-10-CM | POA: Diagnosis not present

## 2023-12-18 DIAGNOSIS — Z0000 Encounter for general adult medical examination without abnormal findings: Secondary | ICD-10-CM

## 2023-12-18 LAB — COMPREHENSIVE METABOLIC PANEL WITH GFR
ALT: 21 U/L (ref 0–53)
AST: 20 U/L (ref 0–37)
Albumin: 4.4 g/dL (ref 3.5–5.2)
Alkaline Phosphatase: 69 U/L (ref 39–117)
BUN: 10 mg/dL (ref 6–23)
CO2: 29 meq/L (ref 19–32)
Calcium: 9.8 mg/dL (ref 8.4–10.5)
Chloride: 103 meq/L (ref 96–112)
Creatinine, Ser: 0.79 mg/dL (ref 0.40–1.50)
GFR: 84.7 mL/min (ref >60.00)
Glucose, Bld: 103 mg/dL — ABNORMAL HIGH (ref 70–99)
Potassium: 4.4 meq/L (ref 3.5–5.1)
Sodium: 140 meq/L (ref 135–145)
Total Bilirubin: 1.4 mg/dL — ABNORMAL HIGH (ref 0.2–1.2)
Total Protein: 6.9 g/dL (ref 6.0–8.3)

## 2023-12-18 LAB — CBC
HCT: 37.5 % — ABNORMAL LOW (ref 39.0–52.0)
Hemoglobin: 12.8 g/dL — ABNORMAL LOW (ref 13.0–17.0)
MCHC: 34.2 g/dL (ref 30.0–36.0)
MCV: 94.3 fl (ref 78.0–100.0)
Platelets: 175 K/uL (ref 150.0–400.0)
RBC: 3.98 Mil/uL — ABNORMAL LOW (ref 4.22–5.81)
RDW: 18.4 % — ABNORMAL HIGH (ref 11.5–15.5)
WBC: 3.9 K/uL — ABNORMAL LOW (ref 4.0–10.5)

## 2023-12-18 LAB — LIPID PANEL
Cholesterol: 111 mg/dL (ref 0–200)
HDL: 48.2 mg/dL (ref >39.00)
LDL Cholesterol: 46 mg/dL (ref 0–99)
NonHDL: 63.29
Total CHOL/HDL Ratio: 2
Triglycerides: 86 mg/dL (ref 0.0–149.0)
VLDL: 17.2 mg/dL (ref 0.0–40.0)

## 2023-12-18 MED ORDER — LOSARTAN POTASSIUM 25 MG PO TABS: 25.0000 mg | ORAL_TABLET | Freq: Every day | ORAL | 3 refills | Status: DC

## 2023-12-18 MED ORDER — ATORVASTATIN CALCIUM 20 MG PO TABS: 20.0000 mg | ORAL_TABLET | Freq: Every day | ORAL | 3 refills | Status: DC

## 2023-12-18 MED ORDER — PANTOPRAZOLE SODIUM 40 MG PO TBEC: 40.0000 mg | DELAYED_RELEASE_TABLET | Freq: Every day | ORAL | 3 refills | Status: DC

## 2023-12-18 NOTE — Patient Instructions (Signed)
 It was great seeing you today   We will follow up with you regarding your lab work   Please let me know if you need anything

## 2023-12-18 NOTE — Progress Notes (Signed)
 Subjective:    Patient ID: Noah King, male    DOB: September 07, 1945, 78 y.o.   MRN: [id number]  HPI Patient presents for yearly preventative medicine examination. He is a pleasant 78 year old male who  has a past medical history of Allergy, Cataract, Dupuytren's contracture, GERD (gastroesophageal reflux disease), Hiatal hernia, Hypertension, and Polycythemia.  Non-small cell lung cancer with mets to brain - Stage IV - managed by oncology - he is s/p left craniotomy with resection of the left frontoparietal brain tumor followed by [facility] in August 2025. He recently finished his course of concurrent chemoradiation with weekly carboplatin  and paclitaxel . He has a follow up appointment with Oncology later this month. He denies nausea, vomiting, diarrhea or loss of appetite   Hypertension-managed with losartan  25 mg daily.  He denies dizziness, lightheadedness, chest pain, or shortness of breath. BP Readings from Last 3 Encounters:  12/18/23 100/80  11/19/23 111/74  11/10/23 105/74   Hyperipidemia- managed with Lipitor 20 mg daily.  He denies myalgia or fatigue Lab Results  Component Value Date   CHOL 119 08/12/2023   HDL 71 08/12/2023   LDLCALC 44 08/12/2023   TRIG 18 08/12/2023   CHOLHDL 1.7 08/12/2023    Peripheral vascular disease-managed with aspirin 81 mg daily and Lipitor 20 mg daily.  He has been seen by vascular surgery on a yearly basis.  He denies claudication symptoms, pain in his legs at rest or nonhealing wounds in his lower extremities.  Occasionally he does get cold feet and will feel unsteady at times- denies any recent falls.   GERD-uses Protonix  40 mg.  He does feel well controlled on this  All immunizations and health maintenance protocols were reviewed with the patient and needed orders were placed.  Appropriate screening laboratory values were ordered for the patient including screening of hyperlipidemia, renal function and hepatic function. If indicated by BPH, a  PSA was ordered.  Medication reconciliation,  past medical history, social history, problem list and allergies were reviewed in detail with the patient  Goals were established with regard to weight loss, exercise, and  diet in compliance with medications Wt Readings from Last 3 Encounters:  12/18/23 153 lb (69.4 kg)  11/19/23 154 lb 1.6 oz (69.9 kg)  11/10/23 154 lb (69.9 kg)   Overall he feels well but has chronic fatigue. He has no acute complaints today   Review of Systems  Constitutional:  Positive for fatigue.  HENT: Negative.    Eyes: Negative.   Respiratory: Negative.    Cardiovascular: Negative.   Gastrointestinal: Negative.   Endocrine: Negative.   Genitourinary: Negative.   Musculoskeletal:  Positive for gait problem.  Skin: Negative.   Allergic/Immunologic: Negative.   Hematological: Negative.   Psychiatric/Behavioral: Negative.    All other systems reviewed and are negative.  Past Medical History:  Diagnosis Date   Allergy    Cataract    left  extraction- right eye forming   Dupuytren's contracture    GERD (gastroesophageal reflux disease)    Hiatal hernia    past hx-  suspect at one time   Hypertension    Polycythemia     Social History   Socioeconomic History   Marital status: Single    Spouse name: Not on file   Number of children: Not on file   Years of education: Not on file   Highest education level: Not on file  Occupational History   Not on file  Tobacco Use   Smoking  status: Former    Current packs/day: 0.00    Types: Cigarettes    Quit date: 01/14/1985    Years since quitting: 38.9   Smokeless tobacco: Never  Vaping Use   Vaping status: Never Used  Substance and Sexual Activity   Alcohol use: Yes    Alcohol/week: 20.0 standard drinks of alcohol    Types: 20 Cans of beer per week   Drug use: No   Sexual activity: Yes    Partners: Female  Other Topics Concern   Not on file  Social History Narrative   Retired- He worked at [employer]    Divorced   One daughter who lives in [place]   Social Drivers of Health   Financial Resource Strain: Low Risk  (11/01/2021)   Overall Financial Resource Strain (CARDIA)    Difficulty of Paying Living Expenses: Not hard at all  Food Insecurity: No Food Insecurity (09/04/2023)   Hunger Vital Sign    Worried About Running Out of Food in the Last Year: Never true    Ran Out of Food in the Last Year: Never true  Transportation Needs: No Transportation Needs (09/04/2023)   PRAPARE - Administrator, Civil Service (Medical): No    Lack of Transportation (Non-Medical): No  Physical Activity: Insufficiently Active (11/27/2023)   Exercise Vital Sign    Days of Exercise per Week: 7 days    Minutes of Exercise per Session: 20 min  Stress: No Stress Concern Present (11/27/2023)   [employer] of Occupational Health - Occupational Stress Questionnaire    Feeling of Stress: Not at all  Social Connections: Socially Isolated (11/27/2023)   Social Connection and Isolation Panel    Frequency of Communication with Friends and Family: More than three times a week    Frequency of Social Gatherings with Friends and Family: More than three times a week    Attends Religious Services: Never    Database Administrator or Organizations: No    Attends Banker Meetings: Never    Marital Status: Divorced  Catering Manager Violence: Not At Risk (09/04/2023)   Humiliation, Afraid, Rape, and Kick questionnaire    Fear of Current or Ex-Partner: No    Emotionally Abused: No    Physically Abused: No    Sexually Abused: No    Past Surgical History:  Procedure Laterality Date   APPLICATION OF CRANIAL NAVIGATION Left 08/21/2023   Procedure: COMPUTER-ASSISTED NAVIGATION, FOR CRANIAL PROCEDURE;  Surgeon: Debby Dorn MATSU, MD;  Location: [facility] OR;  Service: Neurosurgery;  Laterality: Left;   cataract extraction left eye     COLONOSCOPY     CRANIOTOMY Left 08/21/2023   Procedure: CRANIOTOMY  TUMOR EXCISION;  Surgeon: Debby Dorn MATSU, MD;  Location[facility];  Service: Neurosurgery;  Laterality: Left;  LT CRANI FOR TUMOR RESECTION   DUPUYTREN CONTRACTURE RELEASE Left    HERNIA REPAIR  2024   POLYPECTOMY     rt knee arthroscopic     VIDEO BRONCHOSCOPY WITH ENDOBRONCHIAL ULTRASOUND Right 08/13/2023   Procedure: BRONCHOSCOPY, WITH EBUS;  Surgeon: Jude Harden GAILS, MD;  Location: [facility] ENDOSCOPY;  Service: Cardiopulmonary;  Laterality: Right;    Family History  Problem Relation Age of Onset   Stroke Mother    Colon cancer Father 74       d. 41   Breast cancer Sister    Colon polyps Neg Hx    Esophageal cancer Neg Hx     Allergies  Allergen Reactions  Lotensin  [Benazepril  Hcl] Cough    Current Outpatient Medications on File Prior to Visit  Medication Sig Dispense Refill   docusate sodium  (COLACE) 100 MG capsule Take 100 mg by mouth daily as needed (constipation).     Multiple Vitamins-Minerals (MENS 50+ MULTIVITAMIN) TABS Take 1 tablet by mouth daily.     No current facility-administered medications on file prior to visit.    BP 100/80   Pulse 92   Temp 98.4 F (36.9 C) (Oral)   Ht 5' 10 (1.778 m)   Wt 153 lb (69.4 kg)   SpO2 98%   BMI 21.95 kg/m       Objective:   Physical Exam Vitals and nursing note reviewed.  Constitutional:      General: He is not in acute distress.    Appearance: Normal appearance. He is not ill-appearing.  HENT:     Head: Normocephalic and atraumatic.     Right Ear: Tympanic membrane, ear canal and external ear normal. There is no impacted cerumen.     Left Ear: Tympanic membrane, ear canal and external ear normal. There is no impacted cerumen.     Nose: Nose normal. No congestion or rhinorrhea.     Mouth/Throat:     Mouth: Mucous membranes are moist.     Pharynx: Oropharynx is clear.  Eyes:     Extraocular Movements: Extraocular movements intact.     Conjunctiva/sclera: Conjunctivae normal.     Pupils: Pupils are equal, round,  and reactive to light.  Neck:     Vascular: No carotid bruit.  Cardiovascular:     Rate and Rhythm: Normal rate and regular rhythm.     Pulses: Normal pulses.     Heart sounds: No murmur heard.    No friction rub. No gallop.  Pulmonary:     Effort: Pulmonary effort is normal.     Breath sounds: Normal breath sounds.  Abdominal:     General: Abdomen is flat. Bowel sounds are normal. There is no distension.     Palpations: Abdomen is soft. There is no mass.     Tenderness: There is no abdominal tenderness. There is no guarding or rebound.     Hernia: No hernia is present.  Musculoskeletal:        General: Normal range of motion.     Cervical back: Normal range of motion and neck supple.  Lymphadenopathy:     Cervical: No cervical adenopathy.  Skin:    General: Skin is warm and dry.     Capillary Refill: Capillary refill takes less than 2 seconds.  Neurological:     General: No focal deficit present.     Mental Status: He is alert and oriented to person, place, and time.     Gait: Gait abnormal (uses a rolling walker with seat).  Psychiatric:        Mood and Affect: Mood normal.        Behavior: Behavior normal.        Thought Content: Thought content normal.        Judgment: Judgment normal.        Assessment & Plan:  1. Routine general medical examination at a health care facility (Primary) Today patient counseled on age appropriate routine health concerns for screening and prevention, each reviewed and up to date or declined. Immunizations reviewed and up to date or declined. Labs ordered and reviewed. Risk factors for depression reviewed and negative. Hearing function and visual acuity are intact. ADLs screened  and addressed as needed. Functional ability and level of safety reviewed and appropriate. Education, counseling and referrals performed based on assessed risks today. Patient provided with a copy of personalized plan for preventive services. - Follow up in one year or  sooner if needed  2. Non-small cell lung cancer metastatic to brain [facility]) - Per Oncology  - Lipid panel; Future - CBC; Future - Comprehensive metabolic panel with GFR; Future  3. Essential hypertension - Controlled. No change in therapy  - Lipid panel; Future - CBC; Future - Comprehensive metabolic panel with GFR; Future - losartan  (COZAAR ) 25 MG tablet; Take 1 tablet (25 mg total) by mouth daily.  Dispense: 90 tablet; Refill: 3  4. Hyperlipidemia, unspecified hyperlipidemia type - Continue with Lipitor  - Lipid panel; Future - CBC; Future - Comprehensive metabolic panel with GFR; Future - atorvastatin  (LIPITOR) 20 MG tablet; Take 1 tablet (20 mg total) by mouth daily.  Dispense: 90 tablet; Refill: 3  5. PAD (peripheral artery disease) - Continue Lipitor  - Lipid panel; Future - CBC; Future - Comprehensive metabolic panel with GFR; Future - atorvastatin  (LIPITOR) 20 MG tablet; Take 1 tablet (20 mg total) by mouth daily.  Dispense: 90 tablet; Refill: 3  6. Gastroesophageal reflux disease without esophagitis - Controlled. No change  - Lipid panel; Future - CBC; Future - Comprehensive metabolic panel with GFR; Future  Darleene Shape, NP

## 2023-12-19 ENCOUNTER — Other Ambulatory Visit: Payer: Self-pay

## 2023-12-19 DIAGNOSIS — Z483 Aftercare following surgery for neoplasm: Secondary | ICD-10-CM | POA: Diagnosis not present

## 2023-12-19 DIAGNOSIS — R32 Unspecified urinary incontinence: Secondary | ICD-10-CM | POA: Diagnosis not present

## 2023-12-19 DIAGNOSIS — F1010 Alcohol abuse, uncomplicated: Secondary | ICD-10-CM | POA: Diagnosis not present

## 2023-12-19 DIAGNOSIS — Z87891 Personal history of nicotine dependence: Secondary | ICD-10-CM | POA: Diagnosis not present

## 2023-12-19 DIAGNOSIS — R911 Solitary pulmonary nodule: Secondary | ICD-10-CM | POA: Diagnosis not present

## 2023-12-19 DIAGNOSIS — E785 Hyperlipidemia, unspecified: Secondary | ICD-10-CM | POA: Diagnosis not present

## 2023-12-19 DIAGNOSIS — D45 Polycythemia vera: Secondary | ICD-10-CM | POA: Diagnosis not present

## 2023-12-19 DIAGNOSIS — E039 Hypothyroidism, unspecified: Secondary | ICD-10-CM | POA: Diagnosis not present

## 2023-12-19 DIAGNOSIS — K219 Gastro-esophageal reflux disease without esophagitis: Secondary | ICD-10-CM | POA: Diagnosis not present

## 2023-12-19 DIAGNOSIS — I10 Essential (primary) hypertension: Secondary | ICD-10-CM | POA: Diagnosis not present

## 2023-12-19 NOTE — Progress Notes (Unsigned)
 [facility] OFFICE PROGRESS NOTE  Noah Huxley, NP [address] [place] [address]  DIAGNOSIS: Stage IV (T1a, N2b, M1b) non-small cell lung cancer, adenocarcinoma presented with right lower lobe lung nodule in addition to right hilar and mediastinal lymphadenopathy and metastatic brain lesions diagnosed in August 2025.  The patient also has smaller pulmonary nodules in the right and left lung.   Molecular studies by foundation 1 showed no actionable mutations and negative PD-L1 expression.   PRIOR THERAPY:  1) Status post left craniotomy with resection of the left frontoparietal brain tumor followed by [facility].  2) A course of concurrent chemoradiation with weekly carboplatin  for AUC of 2 and paclitaxel  45 MGs/M2.  Status post 6 cycles.   CURRENT THERAPY: Palliative systemic chemotherapy with carboplatin  for an AUC of 5, Alimta 500 mg/m, Keytruda 200 mg IV every 3 weeks.  First dose expected on 01/12/2024.   INTERVAL HISTORY: Noah King 78 y.o. male returns to the clinic today for a follow-up visit.  The patient was last seen by Dr. Sherrod on 11/10/2023.  The patient has completed a course of concurrent chemoradiation.  He tolerated his treatment well.   He also underwent left craniotomy for a left frontal parietal brain tumor followed by [facility].  He is followed closely by radiation oncology. He had a brain MRI this AM 12/24/23. He has a follow up with Dr. Buckley 12/30/23. It looks like he has a new 3 mm enhancing nodular lesion within the left frontal lobe concerning for metastatic disease.   His scan also mention new compression fracture.  He denies any recent falls or injuries.  He denies any back pain.  He denies fevers, chills, or night sweats. Appetite is improving and he has gained weight. He does not experience significant dyspnea on exertion. He has an intermittent cough, occasionally triggered by swallowing, but denies hemoptysis or chest pain. He  denies nausea, vomiting, diarrhea, constipation, or dysphagia during or after radiation therapy. He denies headaches or visual disturbances.  He recently had a restaging CT scan.  He is here today for evaluation and to review his scan results.   MEDICAL HISTORY: Past Medical History:  Diagnosis Date   Allergy    Cataract    left  extraction- right eye forming   Dupuytren's contracture    GERD (gastroesophageal reflux disease)    Hiatal hernia    past hx-  suspect at one time   Hypertension    Polycythemia     ALLERGIES:  is allergic to lotensin  [benazepril  hcl].  MEDICATIONS:  Current Outpatient Medications  Medication Sig Dispense Refill   atorvastatin  (LIPITOR) 20 MG tablet Take 1 tablet (20 mg total) by mouth daily. 90 tablet 3   docusate sodium  (COLACE) 100 MG capsule Take 100 mg by mouth daily as needed (constipation).     losartan  (COZAAR ) 25 MG tablet Take 1 tablet (25 mg total) by mouth daily. 90 tablet 3   Multiple Vitamins-Minerals (MENS 50+ MULTIVITAMIN) TABS Take 1 tablet by mouth daily.     pantoprazole  (PROTONIX ) 40 MG tablet Take 1 tablet (40 mg total) by mouth daily. 90 tablet 3   No current facility-administered medications for this visit.    SURGICAL HISTORY:  Past Surgical History:  Procedure Laterality Date   APPLICATION OF CRANIAL NAVIGATION Left 08/21/2023   Procedure: COMPUTER-ASSISTED NAVIGATION, FOR CRANIAL PROCEDURE;  Surgeon: Debby Dorn MATSU, MD;  Location: [facility] OR;  Service: Neurosurgery;  Laterality: Left;   cataract extraction  left eye     COLONOSCOPY     CRANIOTOMY Left 08/21/2023   Procedure: CRANIOTOMY TUMOR EXCISION;  Surgeon: Debby Dorn MATSU, MD;  Location: [facility] OR;  Service: Neurosurgery;  Laterality: Left;  LT CRANI FOR TUMOR RESECTION   DUPUYTREN CONTRACTURE RELEASE Left    HERNIA REPAIR  2024   POLYPECTOMY     rt knee arthroscopic     VIDEO BRONCHOSCOPY WITH ENDOBRONCHIAL ULTRASOUND Right 08/13/2023   Procedure: BRONCHOSCOPY, WITH  EBUS;  Surgeon: Jude Harden GAILS, MD;  Location: [facility] ENDOSCOPY;  Service: Cardiopulmonary;  Laterality: Right;    REVIEW OF SYSTEMS:   Review of Systems  Constitutional: Negative for appetite change, chills, fatigue, fever and unexpected weight change.  HENT:   Negative for mouth sores, nosebleeds, sore throat and trouble swallowing.   Eyes: Negative for eye problems and icterus.  Respiratory: Negative for cough, hemoptysis, shortness of breath and wheezing.   Cardiovascular: Negative for chest pain and leg swelling.  Gastrointestinal: Negative for abdominal pain, constipation, diarrhea, nausea and vomiting.  Genitourinary: Negative for bladder incontinence, difficulty urinating, dysuria, frequency and hematuria.   Musculoskeletal: Negative for back pain, gait problem, neck pain and neck stiffness.  Skin: Negative for itching and rash.  Neurological: Negative for dizziness, extremity weakness, gait problem, headaches, light-headedness and seizures.  Hematological: Negative for adenopathy. Does not bruise/bleed easily.  Psychiatric/Behavioral: Negative for confusion, depression and sleep disturbance. The patient is not nervous/anxious.     PHYSICAL EXAMINATION:  There were no vitals taken for this visit.  ECOG PERFORMANCE STATUS: 1  Physical Exam  Constitutional: Oriented to person, place, and time and well-developed, well-nourished, and in no distress.  HENT:  Head: Normocephalic and atraumatic.  Mouth/Throat: Oropharynx is clear and moist. No oropharyngeal exudate.  Eyes: Conjunctivae are normal. Right eye exhibits no discharge. Left eye exhibits no discharge. No scleral icterus.  Neck: Normal range of motion. Neck supple.  Cardiovascular: Normal rate, regular rhythm, normal heart sounds and intact distal pulses.   Pulmonary/Chest: Effort normal and breath sounds normal. No respiratory distress. No wheezes. No rales.  Abdominal: Soft. Bowel sounds are normal. Exhibits no distension and  no mass. There is no tenderness.  Musculoskeletal: Normal range of motion. Exhibits no edema.  Lymphadenopathy:    No cervical adenopathy.  Neurological: Alert and oriented to person, place, and time. Exhibits normal muscle tone. Gait normal. Coordination normal.  Skin: Skin is warm and dry. No rash noted. Not diaphoretic. No erythema. No pallor.  Psychiatric: Mood, memory and judgment normal.  Vitals reviewed.  LABORATORY DATA: Lab Results  Component Value Date   WBC 3.9 (L) 12/18/2023   HGB 12.8 (L) 12/18/2023   HCT 37.5 (L) 12/18/2023   MCV 94.3 12/18/2023   PLT 175.0 12/18/2023      Chemistry      Component Value Date/Time   NA 140 12/18/2023 1032   K 4.4 12/18/2023 1032   CL 103 12/18/2023 1032   CO2 29 12/18/2023 1032   BUN 10 12/18/2023 1032   CREATININE 0.79 12/18/2023 1032   CREATININE 0.85 12/09/2023 0856   CREATININE 0.78 12/08/2019 0819      Component Value Date/Time   CALCIUM  9.8 12/18/2023 1032   ALKPHOS 69 12/18/2023 1032   AST 20 12/18/2023 1032   AST 23 12/09/2023 0856   ALT 21 12/18/2023 1032   ALT 23 12/09/2023 0856   BILITOT 1.4 (H) 12/18/2023 1032   BILITOT 1.2 12/09/2023 0856       RADIOGRAPHIC STUDIES:  CT CHEST ABDOMEN PELVIS W CONTRAST Result Date: 12/17/2023 EXAM: CT CHEST ABDOMEN PELVIS WITH THORACIC AND LUMBAR SPINE RECONSTRUCTIONS 12/09/2023 10:15:13 AM TECHNIQUE: CT of the chest, abdomen, pelvis was performed after the administration of 100 mL of iohexol  (OMNIPAQUE ) 300 MG/ML solution. Multiplanar reformatted images are provided for review, including reconstructed images of the thoracic and lumbar spine. Automated exposure control, iterative reconstruction, and/or weight based adjustment of the mA/kV was utilized to reduce the radiation dose to as low as reasonably achievable. COMPARISON: Multiple exams including 08/11/2023 and PET CT of 09/11/2023. CLINICAL HISTORY: Non-small cell lung cancer restaging. FINDINGS: CT CHEST: THORACIC AORTA:  Thoracic aortic, coronary artery, and branch vessel atheromatous vascular calcifications. No acute traumatic injury of the aorta. MEDIASTINUM: Right index hilar node 1.1 cm in short axis, previously 1.3 cm on 08/11/2023. No mediastinal hematoma or pneumomediastinum. No acute traumatic injury to the heart or pericardium. The central airways are clear. LUNGS: Temporal right apical scarring. Old granulomatous disease. Stable 7 x 7 mm left lower lobe nodule on image 95 series 4. New mild subpleural nodularity medially in the right lower lobe measuring 7 x 4 mm on image 95 series 4. Stable 4 mm left lower lobe nodule on image 108 series 4. Reduced size of a 4 mm right lower lobe nodule on image 133 series 4, previously measuring 7 x 7 mm. Stable 8 x 4 mm right lower lobe nodule on image 123 of series 4. No acute traumatic injury to the lungs. No pulmonary contusion or laceration. No effusion or pneumothorax. CHEST WALL: No acute displaced rib fracture. No chest wall hematoma. CT ABDOMEN AND PELVIS: ABDOMINAL AORTA: Systemic atherosclerosis is present, including the aorta and iliac arteries. Atheromatous plaque along the origin of the celiac artery and proximally in the superior mesenteric artery. No acute traumatic injury of the aorta or iliac arteries. HEPATOBILIARY: No acute traumatic injury. SPLEEN: No acute traumatic injury. PANCREAS: No acute traumatic injury. ADRENAL GLANDS: No acute traumatic injury. KIDNEYS: Bilateral scattered simple renal cysts warrant no further imaging workup. Per consensus, no follow-up is needed for simple Bosniak type 1 and 2 renal cysts, unless the patient has a malignancy history or risk factors. No acute traumatic injury. No hydronephrosis. GI TRACT: Percutaneous hernia mass along the anterior pelvic wall. No acute traumatic injury of the bowel. No bowel obstruction. PERITONEUM: No ascites or free air. RETROPERITONEUM: No retroperitoneal hematoma. BLADDER: Mild urinary bladder wall  thickening could be from nondistention or cystitis. No acute abnormality. REPRODUCTIVE ORGANS: No acute abnormality. BONES: Moderate degenerative arthropathy in both hips. No acute traumatic fracture of the pelvis. THORACIC AND LUMBAR SPINE: BONES AND ALIGNMENT: Interval 15% superior endplate compression fracture at T12. No traumatic malalignment. DEGENERATIVE CHANGES: No severe spinal canal stenosis or bony neural foraminal narrowing. SOFT TISSUES: No paraspinal mass or hematoma. IMPRESSION: 1. Interval approximately 15% superior endplate compression fracture at T12. 2. New mild subpleural nodularity medially in the right lower lobe measuring 7 x 4 mm. 3. Stable 7 x 7 mm left lower lobe nodule and stable 4 mm left lower lobe nodule. 4. Reduced size of a right lower lobe nodule now 4 mm, previously 7 x 7 mm. 5. Stable 8 x 4 mm right lower lobe nodule. 6. Right hilar lymph node 1.1 cm short axis, decreased from 1.3 cm. Electronically signed by: Ryan Salvage MD 12/17/2023 11:27 AM EST RP Workstation: [id number]     ASSESSMENT/PLAN:  This is a very pleasant 78 year old Caucasian male with stage IV (T1a, N2b,  M1 B) non-small cell lung cancer, adenocarcinoma.  He presented with a right lower lobe lung nodule in addition to right hilar mediastinal lymphadenopathy.  He also had metastatic disease to the brain.  He was diagnosed in August 2025.  He is status post left craniotomy with resection of the left frontal parietal brain tumor followed by [facility].  He also has smaller pulmonary nodules in the right lung and left lung.  Molecular studies by foundation 1 showed no actionable mutations and his PD-L1 expression is negative.  The patient completed a course of concurrent chemoradiation with weekly carboplatin  for an AUC of 2 and paclitaxel  45 mg/m.  He status post 6 cycles.  The patient was seen with Dr. Sherrod today.  Dr. Sherrod personally and independently reviewed the scan and discussed results with the  patient today.  The scan showed new mild subpleural nodularity in the medial right lower lobe, stable left lower lobe lung nodules, due size of the right lower lobe nodule and stable right lower lobe nodule.  The right hilar lymph node also decreased in size.  The patient did have a brain MRI today which showed new 3 mm enhancing lesion which could be metastatic disease.  Has an appointment to see Dr. Buckley next week.  Given that the patient is stage IV, Dr. Sherrod recommends palliative systemic chemotherapy with carboplatin  for an AUC of 5, Alimta 500 mg/m, and Keytruda 200 mg IV every 3 weeks.  The patient is interested in proceeding with systemic chemotherapy.  He is expected to start her first dose of this treatment on 01/12/24  We discussed the adverse side effects of treatment including but not limited to alopecia, myelosuppression, nausea and vomiting, peripheral neuropathy, liver or renal dysfunction as well as immunotherapy mediated adverse effects.    We will arrange for the patient to have a B12 injection while in the clinic today.     I sent prescriptions for 1 mg folic acid  p.o. daily as well as Compazine  10 mg every 6 hours as needed for nausea.   The patient will follow-up for a one-week follow-up visit after completing her first cycle of chemotherapy.  I referred the patient to social work to help with transportation.  He lives alone and does not drive.  The patient was advised to call immediately if he has any concerning symptoms in the interval. The patient voices understanding of current disease status and treatment options and is in agreement with the current care plan. All questions were answered. The patient knows to call the clinic with any problems, questions or concerns. We can certainly see the patient much sooner if necessary   No orders of the defined types were placed in this encounter.     Janaye [employer] L Brenda Cowher,  PA-C 12/19/23  ADDENDUM: Hematology/Oncology Attending: I had a face-to-face encounter with the patient today.  I reviewed his record, lab, scan and recommended his care plan.  This is a very pleasant 78 years old white male with a stage IV non-small cell lung cancer, adenocarcinoma diagnosed in August 2025 with solitary brain metastases at that time.  Molecular studies showed no actionable mutation and negative PD-L1 expression.  The patient is status post left craniotomy with resection of the left frontoparietal brain tumor followed by [facility].  He was also treated with a course of concurrent chemoradiation with weekly carboplatin  and paclitaxel  to the locally advanced disease in the lung. He had repeat CT scan of the chest performed recently.  I personally independently  reviewed the scan and discussed the result with the patient today.  His scan showed improvement in the mediastinal lymphadenopathy but he continues to have bilateral small pulmonary nodule suspicious for metastatic disease in the lung.  He also has new 0.3 cm brain lesion  medially within the left frontal lobe concerning for mets static disease.  I had a lengthy discussion with the patient today about his condition.  The patient has clear stage IV non-small cell lung cancer even after the course of concurrent chemoradiation as well as the radiation and surgical resection of the brain tumor. I recommended for him treatment with first-line systemic chemoimmunotherapy with carboplatin  for AUC of 5, Alimta 500 mg/M2 and Keytruda 200 mg IV every 3 weeks.  The patient understands that this treatment is palliative in nature with no cure for his condition and the goal of treatment will be prolongation of his survival and palliation of his symptoms. I discussed with the patient the adverse effect of this treatment including but not limited to alopecia, myelosuppression, nausea and vomiting, peripheral neuropathy, liver or renal dysfunction as well as  immunotherapy adverse effects. He is expected to start the first dose of this treatment after Christmas.  He will come back for follow-up visit 1 week after the start of the treatment for management of any adverse effect of his chemotherapy. The patient will receive vitamin B12 injection today and we will send a prescription of folic acid  and antiemetics to his pharmacy. He was advised to call immediately if he has any other concerning symptoms in the interval. Disclaimer: This note was dictated with voice recognition software. Similar sounding words can inadvertently be transcribed and may be missed upon review. Sherrod MARLA Sherrod, MD

## 2023-12-22 ENCOUNTER — Encounter: Payer: Self-pay | Admitting: Radiation Oncology

## 2023-12-23 ENCOUNTER — Encounter: Payer: Self-pay | Admitting: Radiation Oncology

## 2023-12-24 ENCOUNTER — Inpatient Hospital Stay

## 2023-12-24 ENCOUNTER — Encounter: Payer: Self-pay | Admitting: Internal Medicine

## 2023-12-24 ENCOUNTER — Inpatient Hospital Stay: Attending: Internal Medicine | Admitting: Physician Assistant

## 2023-12-24 ENCOUNTER — Inpatient Hospital Stay
Admission: RE | Admit: 2023-12-24 | Discharge: 2023-12-24 | Attending: Radiation Oncology | Admitting: Radiation Oncology

## 2023-12-24 VITALS — BP 138/76 | HR 101 | Temp 98.0°F | Resp 16 | Wt 157.0 lb

## 2023-12-24 DIAGNOSIS — C3431 Malignant neoplasm of lower lobe, right bronchus or lung: Secondary | ICD-10-CM | POA: Insufficient documentation

## 2023-12-24 DIAGNOSIS — Z5112 Encounter for antineoplastic immunotherapy: Secondary | ICD-10-CM | POA: Diagnosis present

## 2023-12-24 DIAGNOSIS — Z79620 Long term (current) use of immunosuppressive biologic: Secondary | ICD-10-CM | POA: Diagnosis not present

## 2023-12-24 DIAGNOSIS — T50995D Adverse effect of other drugs, medicaments and biological substances, subsequent encounter: Secondary | ICD-10-CM | POA: Insufficient documentation

## 2023-12-24 DIAGNOSIS — C7931 Secondary malignant neoplasm of brain: Secondary | ICD-10-CM

## 2023-12-24 DIAGNOSIS — Z7189 Other specified counseling: Secondary | ICD-10-CM | POA: Insufficient documentation

## 2023-12-24 DIAGNOSIS — G9389 Other specified disorders of brain: Secondary | ICD-10-CM | POA: Diagnosis not present

## 2023-12-24 DIAGNOSIS — Z5111 Encounter for antineoplastic chemotherapy: Secondary | ICD-10-CM | POA: Insufficient documentation

## 2023-12-24 MED ORDER — PROCHLORPERAZINE MALEATE 10 MG PO TABS: 10.0000 mg | ORAL_TABLET | Freq: Four times a day (QID) | ORAL | 2 refills | Status: DC | PRN

## 2023-12-24 MED ORDER — GADOPICLENOL 0.5 MMOL/ML IV SOLN
7.0000 mL | Freq: Once | INTRAVENOUS | Status: AC | PRN
  Administered 2023-12-24: 7 mL via INTRAVENOUS

## 2023-12-24 MED ORDER — CYANOCOBALAMIN 1000 MCG/ML IJ SOLN
1000.0000 ug | Freq: Once | INTRAMUSCULAR | Status: AC
  Administered 2023-12-24: 1000 ug via INTRAMUSCULAR
  Filled 2023-12-24: qty 1

## 2023-12-24 MED ORDER — FOLIC ACID 1 MG PO TABS: 1.0000 mg | ORAL_TABLET | Freq: Every day | ORAL | 2 refills | Status: DC

## 2023-12-24 NOTE — Patient Instructions (Signed)
 Summary:  -There are two main categories of lung cancer, they are named based on the size of the cancer cell. One is called Non-Small cell lung cancer. The other type is Small Cell Lung Cancer -The sample (biopsy) that they took of your tumor was consistent with a subtype of Non-small cell lung cancer called Adenocarcinoma. This is the most common type of lung cancer.  -We covered a lot of important information at your appointment today regarding what the treatment plan is moving forward. Here are the the main points that were discussed at your office visit with us  today:  -The treatment that you will receive consists of two chemotherapy drugs, called Carboplatin  and Alimta (also called Pemetrexed) and one immunotherapy drug called Keytruda (pembrolizumab).  -We are planning on starting your treatment next week on 01/12/24 but before your start your treatment, I would like you to attend a Chemotherapy Education Class. This involves having you sit down with one of our nurse educators. She will discuss with your one-on-one more details about your treatment as well as general information about resources here at the cancer center.  -Your treatment will be given once every 3 weeks. We will check your labs once a week for the first ~5 treatments just to make sure that important components of your blood are in an acceptable range -We will get a CT scan after 3 treatments to check on the progress of treatment  Medications:  -I have sent a few important medication prescriptions to your pharmacy.  -Compazine  was sent to your pharmacy. This medication is for nausea. You may take this every 6 hours as needed if you feel nauseous.  -I have also sent a prescription for 1 mg of folic acid  to your pharmacy. We need you to take 1 tablet every day.  -We will administer vitamin B12 every 9 weeks while you are here in the clinic. You have received your first dose today.   Referrals or Imaging:   Follow up:  -We will  see you back for a follow up visit 1 week after your first treatment to see how it went and help manage any side effects of treatment that you may have   -If you need to reach us  at any time, the main office number to the cancer center is [phone], when you call, ask to speak to either Cassie's or Dr. Jeannett nurse.

## 2023-12-24 NOTE — Progress Notes (Signed)
 DISCONTINUE OFF PATHWAY REGIMEN - Non-Small Cell Lung   [id number]:Carboplatin  AUC=2 IV D1 + Paclitaxel  45 mg/m2 IV D1 q7 Days + RT:   A cycle is every 7 days, concurrent with RT:     Paclitaxel       Carboplatin    **Always confirm dose/schedule in your pharmacy ordering system**  PRIOR TREATMENT: Off Pathway: Carboplatin  AUC=2 IV D1 + Paclitaxel  45 mg/m2 IV D1 q7 Days + RT  START ON PATHWAY REGIMEN - Non-Small Cell Lung     A cycle is every 21 days:     Pembrolizumab      Pemetrexed      Carboplatin    **Always confirm dose/schedule in your pharmacy ordering system**  Patient Characteristics: Stage IV Metastatic, Nonsquamous, Molecular Analysis Completed, Molecular Alteration Present and Targeted Therapy Exhausted, OR EGFR/KRAS/HER2/NRG1/c-Met Present and Standard Chemotherapy/Immunotherapy Planned, OR No Alteration Present, Initial  Chemotherapy/Immunotherapy, PS = 0, 1, No Alteration Present, No Alteration Present, Candidate for Immunotherapy, PD-L1 Expression Positive 1-49% (TPS) / Negative / Not Tested / Awaiting Test Results and Immunotherapy Candidate Therapeutic Status: Stage IV Metastatic Histology: Nonsquamous Cell Broad Molecular Profiling Status: Molecular Analysis Completed Molecular Analysis Results: No Alteration Present Chemotherapy/Immunotherapy Line of Therapy: Initial Chemotherapy/Immunotherapy ECOG Performance Status: 1 EGFR Exons 18-21 Mutation Testing Status: Completed and Negative BRAF V600 Mutation Testing Status: Completed and Negative KRAS G12C Mutation Testing Status: Completed and Negative MET Exon 14 Mutation Testing Status: Completed and Negative HER2 Mutation Testing Status: Completed and Negative c-Met Overexpression (EGFR Wildtype) Testing Status: Completed and Negative ALK Fusion/Rearrangement Testing Status: Completed and Negative RET Fusion/Rearrangement Testing Status: Completed and Negative NTRK Fusion/Rearrangement Testing Status: Completed  and Negative ROS1 Fusion/Rearrangement Testing Status: Completed and Negative NRG1 Fusion/Rearrangement Testing Status: Completed and Negative Immunotherapy Candidate Status: Candidate for Immunotherapy PD-L1 Expression Status: PD-L1 Negative Intent of Therapy: Non-Curative / Palliative Intent, Discussed with Patient

## 2023-12-26 ENCOUNTER — Inpatient Hospital Stay: Admitting: Licensed Clinical Social Worker

## 2023-12-26 DIAGNOSIS — C3431 Malignant neoplasm of lower lobe, right bronchus or lung: Secondary | ICD-10-CM

## 2023-12-26 NOTE — Progress Notes (Signed)
 [facility] Clinical Social Work  Initial Assessment   Noah King is a 78 y.o. year old male contacted by phone. Clinical Social Work was referred by medical provider for assessment of psychosocial needs.   SDOH (Social Determinants of Health) assessments performed: Yes SDOH Interventions    Flowsheet Row Clinical Support from 11/27/2023 in [facility] [place] Patient Outreach Telephone from 09/04/2023 in [place] [facility] Clinical Support from 11/01/2021 in [facility] [place] Clinical Support from 10/31/2020 in [facility] [place] [facility] [place] Chronic Care Management from 02/23/2020 in [facility] [place] Clinical Support from 12/14/2019 in [facility] [place]  SDOH Interventions        Food Insecurity Interventions -- Intervention Not Indicated Intervention Not Indicated Intervention Not Indicated -- Intervention Not Indicated  Housing Interventions -- Intervention Not Indicated Intervention Not Indicated Intervention Not Indicated -- Intervention Not Indicated  Transportation Interventions -- Intervention Not Indicated Intervention Not Indicated Intervention Not Indicated Intervention Not Indicated Intervention Not Indicated  Utilities Interventions -- Intervention Not Indicated -- -- -- --  Alcohol Usage Interventions -- -- Intervention Not Indicated (Score <7) -- -- --  Depression Interventions/Treatment  -- -- -- -- -- PHQ2-9 Score <4 Follow-up Not Indicated  Financial Strain Interventions -- -- Intervention Not Indicated Intervention Not Indicated Intervention Not Indicated Intervention Not Indicated  Physical Activity Interventions Other (Comments), Intervention Not Indicated -- Intervention Not Indicated Intervention Not Indicated -- Intervention Not Indicated  Stress Interventions Intervention Not Indicated -- Intervention Not Indicated Intervention Not  Indicated -- Intervention Not Indicated  Social Connections Interventions Intervention Not Indicated -- Intervention Not Indicated Intervention Not Indicated -- Intervention Not Indicated    SDOH Screenings   Food Insecurity: No Food Insecurity (12/25/2023)  Housing: Low Risk (12/25/2023)  Transportation Needs: No Transportation Needs (12/25/2023)  Utilities: Not At Risk (12/25/2023)  Alcohol Screen: Low Risk (11/01/2021)  Depression (PHQ2-9): Low Risk (12/24/2023)  Financial Resource Strain: Low Risk (11/01/2021)  Physical Activity: Insufficiently Active (11/27/2023)  Social Connections: Socially Isolated (11/27/2023)  Stress: No Stress Concern Present (11/27/2023)  Tobacco Use: Medium Risk (12/18/2023)    PHQ 2/9:    12/24/2023    3:00 PM 11/27/2023    4:15 PM 11/10/2023   10:10 AM  Depression screen PHQ 2/9  Decreased Interest 0 0 0  Down, Depressed, Hopeless 0 0 0  PHQ - 2 Score 0 0 0     Distress Screen completed: Yes    10/06/2023   10:42 AM  ONCBCN DISTRESS SCREENING  Screening Type Initial Screening  How much distress have you been experiencing in the past week? (0-10) 4  Practical concerns type Taking care of myself;Insurance;Transportation  Physical Concerns Type  Loss or change of physical abilities      Family/Social Information:  Housing Arrangement: patient lives alone Family members/support persons in your life? Pt has a daughter who resides 2 hours away.  When pt originally went through treatment his daughter stayed w/ him, but has needed to return to her own home.  Pt also had a friend who assisted w/ transportation, but she also has moved away.  Pt's support at this time will be very limited. Transportation concerns: yes, pt is not able to drive.  Pt to be referred to transportation for infusion appointments.  Employment: Retired .  Income source: Actor concerns: No Type of concern: None Food access concerns:  no Religious or spiritual practice:  Not known Advanced directives: Not known Services Currently in place:  none  Coping/ Adjustment to diagnosis: Patient understands treatment plan and what happens next? yes Concerns about diagnosis and/or treatment: Overwhelmed by information, Afraid of cancer, How will I care for myself, and Quality of life Patient reported stressors: Transportation, Anxiety/ nervousness, and Adjusting to my illness Hopes and/or priorities: pt's priority is to start treatment w/ the hope of positive results Patient enjoys time with family/ friends Current coping skills/ strengths: Capable of independent living , Motivation for treatment/growth , and Physical Health     SUMMARY: Current SDOH Barriers:  Limited social support and Transportation  Clinical Social Work Clinical Goal(s):  Explore community resource options for unmet needs related to:  Transportation  Interventions: Discussed common feeling and emotions when being diagnosed with cancer, and the importance of support during treatment Informed patient of the support team roles and support services at [facility] Provided CSW contact information and encouraged patient to call with any questions or concerns Pt to be referred to transportation.  CSW spoke w/ pt at length regarding the potential need for support in his home should he have significant symptoms due to treatment.  It is also uncertain at this time if pt may need radiation.  CSW to continue to provide support and offer guidance should pt not be able to take care of himself while undergoing treatment.     Follow Up Plan: Patient will contact CSW with any support or resource needs Patient verbalizes understanding of plan: Yes    Noah JONELLE Manna, LCSW Clinical Social Worker [facility]

## 2023-12-29 ENCOUNTER — Ambulatory Visit: Admitting: Radiation Oncology

## 2023-12-29 ENCOUNTER — Inpatient Hospital Stay

## 2023-12-29 ENCOUNTER — Encounter

## 2023-12-29 DIAGNOSIS — C7931 Secondary malignant neoplasm of brain: Secondary | ICD-10-CM | POA: Insufficient documentation

## 2023-12-29 NOTE — Progress Notes (Signed)
 Radiation Oncology         [phone] ________________________________  Outpatient Follow Up - Conducted via telephone at patient request.  I spoke with the patient to conduct this follow up visit via telephone. The patient was notified in advance and was offered an in person or telemedicine meeting to allow for face to face communication but instead preferred to proceed with a telephone visit.  Name: Noah King        MRN: [id number]  Date of Service: 12/31/2023 DOB: 1945/12/16  RR:Wjqsphzm, Darleene, NP  Merna Darleene, NP     REFERRING PHYSICIAN: Merna Darleene, NP   DIAGNOSIS: The primary encounter diagnosis was Malignant neoplasm of lower lobe of right lung (HCC). A diagnosis of Metastasis to brain [facility]) was also pertinent to this visit.   HISTORY OF PRESENT ILLNESS: Noah King is a 78 y.o. male known to our service who presented with stage IV lung cancer in the summer 2025 after experiencing weakness over the course of approximately 1 month.  He was seen in the emergency room and imaging showed a 4.3 x 4.8 cm peripherally enhancing mass with central necrosis at the juncture of the frontal and parietal lobes on the left side with moderate vasogenic edema, local mass effect with partial effacement of the left lateral ventricle, and an additional 5 mm enhancing lesion at the juncture of the posterior hippocampus and crus of the fornix on the left.  Nonspecific chronic microhemorrhages were also noted in a background of parenchymal atrophy and chronic small vessel disease.  CT angio of the head and neck showed a moderately calcified and noncalcified plaque within the proximal left internal carotid artery with 50% luminal stenosis.  Chest abdomen and pelvis CT was performed, there were multiple prominent mediastinal and hilar lymph nodes which were felt to be indeterminant, he had linear and irregular scarring in the right lung apex with patchy areas of platelike atelectasis  throughout bilateral lungs without specific mass.  There are multiple calcified and noncalcified nodules throughout both lungs the largest in the right lower lobe measured 7 mm in greatest dimension.  Multiple simple cysts in both kidneys were noted the largest measuring up to 3.2 cm, and several loops of small bowel in the left abdomen and right lower quadrant showed mild to moderate circumferential wall thickening with perienteric fat stranding and prominence of both fossa recta compatible with enteritis without any other focal findings.  There was a well-defined 1.7 cm lytic lesion in the upper portion of the S1 vertebral body incompletely characterized but favored to be benign and/or degenerative.     He did proceed with bronchoscopy with EBUS on 08/13/23 and cytology did confirm lymphocytes to indicate a representative sampling of the mediastinal nodes, however preliminary and final testing did not show any malignant cells. Given this, he was discharged and proceeded with craniotomy on 08/21/23 and final pathology showed metastatic carcinoma with papillary features consistent with lung cancer. Postoperative imaging on 08/22/23 showed minimal residual enhancement along the deep margin of the operative bed and stable 4 mm focus of enhancement in the posterior left hippocampus. Slight improvement in the mass effect of the left parietal lobe, and susceptibility involving the cerebellum medial left occipital lobe posterior left parietal lobe and anterior left frontal lobe.  He proceeded with postoperative stereotactic radiosurgery which he completed in September 2025, and given his performance status and oligometastatic presentation he was also offered definitive treatment to the RLL which he completed radiotherapy on 11/17/2023.  The patient had new subpleural nodularity in the medial right lower lobe and stable left lower lobe nodules, and his regimen will begin on 01/12/2024 with carboplatin , Alimta, and Keytruda.   His most recent MRI of the brain on 12/24/2023 showed mild irregular reticular enhancement along the left parietal resection cavity, marginally improved and a new 3 mm enhancing nodular lesion in the left frontal lobe concerning for new disease, and unchanged punctate lesion in the left hippocampus has continued to be observed and not treated previously.  His case has been discussed in multidisciplinary brain oncology conference and he also met with Dr. Buckley to review these results.  Recommendations have been to proceed with stereotactic radiosurgery to the 3 mm left frontal lesion.  He is contacted by phone today to discuss this.  PREVIOUS RADIATION THERAPY:    10/07/23-11/17/23 Plan Name: Lung_R Site: Lung, RLL Technique: 3D Mode: Photon Dose Per Fraction: 2 Gy Prescribed Dose (Delivered / Prescribed): 60 Gy / 60 Gy Prescribed Fxs (Delivered / Prescribed): 30 / 30    09/19/23-09/24/23 SRS Treatment Plan Name: Brain_SRT Site: Brain POSTOP SRS [id number]  Technique: SBRT/SRT-IMRT Mode: Photon Dose Per Fraction: 9 Gy Prescribed Dose (Delivered / Prescribed): 27 Gy / 27 Gy Prescribed Fxs (Delivered / Prescribed): 3 / 3     PAST MEDICAL HISTORY:  Past Medical History:  Diagnosis Date   Allergy    Cataract    left  extraction- right eye forming   Dupuytren's contracture    GERD (gastroesophageal reflux disease)    Hiatal hernia    past hx-  suspect at one time   Hypertension    Polycythemia        PAST SURGICAL HISTORY: Past Surgical History:  Procedure Laterality Date   APPLICATION OF CRANIAL NAVIGATION Left 08/21/2023   Procedure: COMPUTER-ASSISTED NAVIGATION, FOR CRANIAL PROCEDURE;  Surgeon: Debby Dorn MATSU, MD;  Location: [facility] OR;  Service: Neurosurgery;  Laterality: Left;   cataract extraction left eye     COLONOSCOPY     CRANIOTOMY Left 08/21/2023   Procedure: CRANIOTOMY TUMOR EXCISION;  Surgeon: Debby Dorn MATSU, MD;  Location: [facility] OR;  Service:  Neurosurgery;  Laterality: Left;  LT CRANI FOR TUMOR RESECTION   DUPUYTREN CONTRACTURE RELEASE Left    HERNIA REPAIR  2024   POLYPECTOMY     rt knee arthroscopic     VIDEO BRONCHOSCOPY WITH ENDOBRONCHIAL ULTRASOUND Right 08/13/2023   Procedure: BRONCHOSCOPY, WITH EBUS;  Surgeon: Jude Harden GAILS, MD;  Location: [facility] ENDOSCOPY;  Service: Cardiopulmonary;  Laterality: Right;     FAMILY HISTORY:  Family History  Problem Relation Age of Onset   Stroke Mother    Colon cancer Father 35       d. 64   Breast cancer Sister    Colon polyps Neg Hx    Esophageal cancer Neg Hx      SOCIAL HISTORY:  reports that he quit smoking about 38 years ago. His smoking use included cigarettes. He has never used smokeless tobacco. He reports current alcohol use of about 20.0 standard drinks of alcohol per week. He reports that he does not use drugs. The patient is single. He's retired from [employer] in multiple roles. He lives in [place] and his daughter Sherri Aguinaldo lives in [place] and often participates in conversation.   ALLERGIES: Lotensin  [benazepril  hcl]   MEDICATIONS:  Current Outpatient Medications  Medication Sig Dispense Refill   atorvastatin  (LIPITOR) 20 MG tablet Take 1 tablet (20 mg total) by mouth daily. 90  tablet 3   docusate sodium  (COLACE) 100 MG capsule Take 100 mg by mouth daily as needed (constipation).     folic acid  (FOLVITE ) 1 MG tablet Take 1 tablet (1 mg total) by mouth daily. 30 tablet 2   losartan  (COZAAR ) 25 MG tablet Take 1 tablet (25 mg total) by mouth daily. 90 tablet 3   Multiple Vitamins-Minerals (MENS 50+ MULTIVITAMIN) TABS Take 1 tablet by mouth daily.     pantoprazole  (PROTONIX ) 40 MG tablet Take 1 tablet (40 mg total) by mouth daily. 90 tablet 3   prochlorperazine  (COMPAZINE ) 10 MG tablet Take 1 tablet (10 mg total) by mouth every 6 (six) hours as needed. 30 tablet 2   No current facility-administered medications for this visit.     REVIEW OF SYSTEMS: On review  of systems, the patient reports that he is doing well. He's no longer taking steroids or antiepileptics. He feels like he's healing well. He denies any weakness or falls. No other complaints are verbalized.       PHYSICAL EXAM:  Unable to assess due to encounter type    ECOG = 0  0 - Asymptomatic (Fully active, able to carry on all predisease activities without restriction)  1 - Symptomatic but completely ambulatory (Restricted in physically strenuous activity but ambulatory and able to carry out work of a light or sedentary nature. For example, light housework, office work)  2 - Symptomatic, <50% in bed during the day (Ambulatory and capable of all self care but unable to carry out any work activities. Up and about more than 50% of waking hours)  3 - Symptomatic, >50% in bed, but not bedbound (Capable of only limited self-care, confined to bed or chair 50% or more of waking hours)  4 - Bedbound (Completely disabled. Cannot carry on any self-care. Totally confined to bed or chair)  5 - Death   Raylene MM, Creech RH, Tormey DC, et al. [phone]). Toxicity and response criteria of the [facility]. Am. DOROTHA Bridges. Oncol. 5 (6): 649-55    LABORATORY DATA:  Lab Results  Component Value Date   WBC 3.9 (L) 12/18/2023   HGB 12.8 (L) 12/18/2023   HCT 37.5 (L) 12/18/2023   MCV 94.3 12/18/2023   PLT 175.0 12/18/2023   Lab Results  Component Value Date   NA 140 12/18/2023   K 4.4 12/18/2023   CL 103 12/18/2023   CO2 29 12/18/2023   Lab Results  Component Value Date   ALT 21 12/18/2023   AST 20 12/18/2023   ALKPHOS 69 12/18/2023   BILITOT 1.4 (H) 12/18/2023      RADIOGRAPHY: MR Brain W Wo Contrast Result Date: 12/24/2023 EXAM: MRI BRAIN WITH AND WITHOUT CONTRAST 12/24/2023 12:20:27 PM TECHNIQUE: Multiplanar multisequence MRI of the head/brain was performed with and without the administration of intravenous contrast. COMPARISON: MRI of the brain dated  09/16/2023. CLINICAL HISTORY: Brain metastases, assess treatment response; 3T SRS Protocol. Following punctate untreated hippocampal lesion. FINDINGS: BRAIN AND VENTRICLES: No acute infarct. No acute intracranial hemorrhage. No mass effect or midline shift. No hydrocephalus. The sella is unremarkable. Normal flow voids. Mild residual irregular reticular enhancement again seen involving the left parietal resection cavity, which appears to be marginally improved. The lesion within the left hippocampus is again demonstrated on image 62 of series 14 and appears to be unchanged. There is a new lesion present medially within the left frontal lobe, which is well demonstrated on image 101 of series 13, measuring approximately  3 mm in diameter. There is age-related atrophy and mild-to-moderate periventricular and subcortical white matter disease. ORBITS: No acute abnormality. SINUSES: No acute abnormality. BONES AND SOFT TISSUES: Normal bone marrow signal and enhancement. No acute soft tissue abnormality. IMPRESSION: 1. New 3 mm enhancing nodular lesion medially within the left frontal lobe concerning for metastatic disease. 2. Mild residual irregular reticular enhancement involving the left parietal resection cavity, marginally improved. 3. Unchanged punctate lesion within the left hippocampus. 4. Age-related atrophy and mild-to-moderate periventricular and subcortical white matter disease. Electronically signed by: Evalene Coho MD 12/24/2023 12:49 PM EST RP Workstation: [id number]   CT CHEST ABDOMEN PELVIS W CONTRAST Result Date: 12/17/2023 EXAM: CT CHEST ABDOMEN PELVIS WITH THORACIC AND LUMBAR SPINE RECONSTRUCTIONS 12/09/2023 10:15:13 AM TECHNIQUE: CT of the chest, abdomen, pelvis was performed after the administration of 100 mL of iohexol  (OMNIPAQUE ) 300 MG/ML solution. Multiplanar reformatted images are provided for review, including reconstructed images of the thoracic and lumbar spine. Automated exposure  control, iterative reconstruction, and/or weight based adjustment of the mA/kV was utilized to reduce the radiation dose to as low as reasonably achievable. COMPARISON: Multiple exams including 08/11/2023 and PET CT of 09/11/2023. CLINICAL HISTORY: Non-small cell lung cancer restaging. FINDINGS: CT CHEST: THORACIC AORTA: Thoracic aortic, coronary artery, and branch vessel atheromatous vascular calcifications. No acute traumatic injury of the aorta. MEDIASTINUM: Right index hilar node 1.1 cm in short axis, previously 1.3 cm on 08/11/2023. No mediastinal hematoma or pneumomediastinum. No acute traumatic injury to the heart or pericardium. The central airways are clear. LUNGS: Temporal right apical scarring. Old granulomatous disease. Stable 7 x 7 mm left lower lobe nodule on image 95 series 4. New mild subpleural nodularity medially in the right lower lobe measuring 7 x 4 mm on image 95 series 4. Stable 4 mm left lower lobe nodule on image 108 series 4. Reduced size of a 4 mm right lower lobe nodule on image 133 series 4, previously measuring 7 x 7 mm. Stable 8 x 4 mm right lower lobe nodule on image 123 of series 4. No acute traumatic injury to the lungs. No pulmonary contusion or laceration. No effusion or pneumothorax. CHEST WALL: No acute displaced rib fracture. No chest wall hematoma. CT ABDOMEN AND PELVIS: ABDOMINAL AORTA: Systemic atherosclerosis is present, including the aorta and iliac arteries. Atheromatous plaque along the origin of the celiac artery and proximally in the superior mesenteric artery. No acute traumatic injury of the aorta or iliac arteries. HEPATOBILIARY: No acute traumatic injury. SPLEEN: No acute traumatic injury. PANCREAS: No acute traumatic injury. ADRENAL GLANDS: No acute traumatic injury. KIDNEYS: Bilateral scattered simple renal cysts warrant no further imaging workup. Per consensus, no follow-up is needed for simple Bosniak type 1 and 2 renal cysts, unless the patient has a  malignancy history or risk factors. No acute traumatic injury. No hydronephrosis. GI TRACT: Percutaneous hernia mass along the anterior pelvic wall. No acute traumatic injury of the bowel. No bowel obstruction. PERITONEUM: No ascites or free air. RETROPERITONEUM: No retroperitoneal hematoma. BLADDER: Mild urinary bladder wall thickening could be from nondistention or cystitis. No acute abnormality. REPRODUCTIVE ORGANS: No acute abnormality. BONES: Moderate degenerative arthropathy in both hips. No acute traumatic fracture of the pelvis. THORACIC AND LUMBAR SPINE: BONES AND ALIGNMENT: Interval 15% superior endplate compression fracture at T12. No traumatic malalignment. DEGENERATIVE CHANGES: No severe spinal canal stenosis or bony neural foraminal narrowing. SOFT TISSUES: No paraspinal mass or hematoma. IMPRESSION: 1. Interval approximately 15% superior endplate compression fracture at T12. 2.  New mild subpleural nodularity medially in the right lower lobe measuring 7 x 4 mm. 3. Stable 7 x 7 mm left lower lobe nodule and stable 4 mm left lower lobe nodule. 4. Reduced size of a right lower lobe nodule now 4 mm, previously 7 x 7 mm. 5. Stable 8 x 4 mm right lower lobe nodule. 6. Right hilar lymph node 1.1 cm short axis, decreased from 1.3 cm. Electronically signed by: Ryan Salvage MD 12/17/2023 11:27 AM EST RP Workstation: [id number]       IMPRESSION/PLAN: 1. Progressive Stage IV, [id number], NSCLC, NOS of the RLL with brain disease. Today we discussed the findings from the patient's most recent MRI scan. His case was discussed in multidisciplinary brain oncology conference and Dr. Dewey has recommended  additional stereotactic radiosurgery in a single fraction.  We discussed the risks, benefits, short, and long term effects of radiotherapy, as well as the definitive local though overall palliative intent, and the patient is interested in proceeding.  He is scheduled to proceed with simulation on Thursday this  week and treatment on12/23/25 with Dr. Lanis covering for Dr. Debby.  He will continue with plans for systemic chemoimmunotherapy. He will sign written consent tomorrow prior to simulation.     This encounter was conducted via telephone.  The patient has provided two factor identification and has given verbal consent for this type of encounter and has been advised to only accept a meeting of this type in a secure network environment. The time spent during this encounter was 35 minutes including preparation, discussion, and coordination of the patient's care. The attendants for this meeting include Madelin Frost, LPN, Donald Estefana Husband  and Jermany C Setzer.  During the encounter, Madelin Frost, LPN,  and Donald Estefana Husband were located at [facility] Radiation Oncology Department.  Jakel C Muska was located at home.       Donald KYM Husband, [facility]   **Disclaimer: This note was dictated with voice recognition software. Similar sounding words can inadvertently be transcribed and this note may contain transcription errors which may not have been corrected upon publication of note.**

## 2023-12-30 ENCOUNTER — Inpatient Hospital Stay: Admitting: Internal Medicine

## 2023-12-30 ENCOUNTER — Other Ambulatory Visit: Payer: Self-pay | Admitting: Radiation Therapy

## 2023-12-30 ENCOUNTER — Telehealth: Payer: Self-pay | Admitting: Internal Medicine

## 2023-12-30 ENCOUNTER — Telehealth: Payer: Self-pay | Admitting: Radiation Therapy

## 2023-12-30 VITALS — BP 138/72 | HR 102 | Temp 98.3°F | Resp 17 | Ht 70.0 in | Wt 154.0 lb

## 2023-12-30 DIAGNOSIS — C7931 Secondary malignant neoplasm of brain: Secondary | ICD-10-CM

## 2023-12-30 DIAGNOSIS — Z5112 Encounter for antineoplastic immunotherapy: Secondary | ICD-10-CM | POA: Diagnosis not present

## 2023-12-30 NOTE — Telephone Encounter (Signed)
 Left messages on home and cell requesting a call back to discuss his upcoming radiation treatment planning appointments.   Devere Perch R.T(R)(T) Radiation Special Procedures Lead

## 2023-12-30 NOTE — Progress Notes (Signed)
 [facility] at [facility] [address]  [place], [place] [address] [phone]   New Patient Evaluation  Date of Service: 12/30/2023 Patient Name: Noah King Patient MRN: [id number] Patient DOB: [birth year] Provider: Laiana Fratus K Torren Maffeo, MD  Identifying Statement:  Noah King is a 78 y.o. male with Metastasis to brain [facility]) who presents for initial consultation and evaluation regarding cancer associated neurologic deficits.    Referring Provider: Merna Huxley, NP [address] [place],  [place] [address]  Primary Cancer:  Oncologic History: Oncology History  Malignant neoplasm of lower lobe of right lung (HCC)  09/10/2023 Initial Diagnosis   Malignant neoplasm of lower lobe of right lung (HCC)   09/25/2023 Cancer Staging   Staging form: Lung, AJCC V9 - Clinical: Stage IVA Noah King, cN2a, cM1b) - Signed by Sherrod Sherrod, MD on 09/25/2023 Method of lymph node assessment: Clinical   10/07/2023 - 11/10/2023 Chemotherapy   Patient is on Treatment Plan : LUNG Carboplatin  + Paclitaxel  + XRT q7d     01/12/2024 -  Chemotherapy   Patient is on Treatment Plan : LUNG Carboplatin  (5) + Pemetrexed (500) + Pembrolizumab (200) D1 q21d Induction x 4 cycles / Maintenance Pemetrexed (500) + Pembrolizumab (200) D1 q21d      CNS Oncologic History 08/21/23: Left parietal craniotomy, metastasis resection with Dr. Debby 09/24/23: Completes fractionated post-op SRS Noah King)  History of Present Illness: The patient's records from the referring physician were obtained and reviewed and the patient interviewed to confirm this HPI.  Noah King presents for follow up after recent MRI brain study.  Noah King denies any new or progressive neurologic deficits.  Noah King continues to use his walker to ambulate.  Noah King is not currently driving.  No seizures or headaches.  Plans to initiate next phase of systemic therapy with Dr. Sherrod later this  month.  Medications: Medications Ordered Prior to Encounter[1]  Allergies: Allergies[2] Past Medical History:  Past Medical History:  Diagnosis Date   Allergy    Cataract    left  extraction- right eye forming   Dupuytren's contracture    GERD (gastroesophageal reflux disease)    Hiatal hernia    past hx-  suspect at one time   Hypertension    Polycythemia    Past Surgical History:  Past Surgical History:  Procedure Laterality Date   APPLICATION OF CRANIAL NAVIGATION Left 08/21/2023   Procedure: COMPUTER-ASSISTED NAVIGATION, FOR CRANIAL PROCEDURE;  Surgeon: Debby Dorn MATSU, MD;  Location: [facility] OR;  Service: Neurosurgery;  Laterality: Left;   cataract extraction left eye     COLONOSCOPY     CRANIOTOMY Left 08/21/2023   Procedure: CRANIOTOMY TUMOR EXCISION;  Surgeon: Debby Dorn MATSU, MD;  Location: [facility] OR;  Service: Neurosurgery;  Laterality: Left;  LT CRANI FOR TUMOR RESECTION   DUPUYTREN CONTRACTURE RELEASE Left    HERNIA REPAIR  2024   POLYPECTOMY     rt knee arthroscopic     VIDEO BRONCHOSCOPY WITH ENDOBRONCHIAL ULTRASOUND Right 08/13/2023   Procedure: BRONCHOSCOPY, WITH EBUS;  Surgeon: Jude Harden GAILS, MD;  Location: [facility] ENDOSCOPY;  Service: Cardiopulmonary;  Laterality: Right;   Social History:  Social History   Socioeconomic History   Marital status: Single    Spouse name: Not on file   Number of children: Not on file   Years of education: Not on file   Highest education level: Not on file  Occupational History   Not on file  Tobacco Use   Smoking  status: Former    Current packs/day: 0.00    Types: Cigarettes    Quit date: 01/14/1985    Years since quitting: 38.9   Smokeless tobacco: Never  Vaping Use   Vaping status: Never Used  Substance and Sexual Activity   Alcohol use: Yes    Alcohol/week: 20.0 standard drinks of alcohol    Types: 20 Cans of beer per week   Drug use: No   Sexual activity: Yes    Partners: Female  Other Topics Concern   Not on  file  Social History Narrative   Retired- Noah King worked at [employer]   Divorced   One daughter who lives in [place]   Social Drivers of Health   Tobacco Use: Medium Risk (12/18/2023)   Patient History    Smoking Tobacco Use: Former    Smokeless Tobacco Use: Never    Passive Exposure: Not on Actuary Strain: Low Risk (11/01/2021)   Overall Financial Resource Strain (CARDIA)    Difficulty of Paying Living Expenses: Not hard at all  Food Insecurity: No Food Insecurity (12/25/2023)   Epic    Worried About Programme Researcher, Broadcasting/film/video in the Last Year: Never true    Ran Out of Food in the Last Year: Never true  Transportation Needs: No Transportation Needs (12/25/2023)   Epic    Lack of Transportation (Medical): No    Lack of Transportation (Non-Medical): No  Physical Activity: Insufficiently Active (11/27/2023)   Exercise Vital Sign    Days of Exercise per Week: 7 days    Minutes of Exercise per Session: 20 min  Stress: No Stress Concern Present (11/27/2023)   [employer] of Occupational Health - Occupational Stress Questionnaire    Feeling of Stress: Not at all  Social Connections: Socially Isolated (11/27/2023)   Social Connection and Isolation Panel    Frequency of Communication with Friends and Family: More than three times a week    Frequency of Social Gatherings with Friends and Family: More than three times a week    Attends Religious Services: Never    Database Administrator or Organizations: No    Attends Banker Meetings: Never    Marital Status: Divorced  Catering Manager Violence: Not At Risk (09/04/2023)   Epic    Fear of Current or Ex-Partner: No    Emotionally Abused: No    Physically Abused: No    Sexually Abused: No  Depression (PHQ2-9): Low Risk (12/24/2023)   Depression (PHQ2-9)    PHQ-2 Score: 0  Alcohol Screen: Low Risk (11/01/2021)   Alcohol Screen    Last Alcohol Screening Score (AUDIT): 5  Housing: Low Risk (12/25/2023)   Epic     Unable to Pay for Housing in the Last Year: No    Number of Times Moved in the Last Year: 0    Homeless in the Last Year: No  Utilities: Not At Risk (12/25/2023)   Epic    Threatened with loss of utilities: No  Health Literacy: Not on file   Family History:  Family History  Problem Relation Age of Onset   Stroke Mother    Colon cancer Father 81       d. 66   Breast cancer Sister    Colon polyps Neg Hx    Esophageal cancer Neg Hx     Review of Systems: Constitutional: Doesn't report fevers, chills or abnormal weight loss Eyes: Doesn't report blurriness of vision Ears, nose, mouth, throat, and face:  Doesn't report sore throat Respiratory: Doesn't report cough, dyspnea or wheezes Cardiovascular: Doesn't report palpitation, chest discomfort  Gastrointestinal:  Doesn't report nausea, constipation, diarrhea GU: Doesn't report incontinence Skin: Doesn't report skin rashes Neurological: Per HPI Musculoskeletal: Doesn't report joint pain Behavioral/Psych: Doesn't report anxiety  Physical Exam: Vitals:   12/30/23 1004  BP: 138/72  Pulse: (!) 102  Resp: 17  Temp: 98.3 F (36.8 C)  SpO2: 100%   KPS: 80. General: Alert, cooperative, pleasant, in no acute distress Head: Normal EENT: No conjunctival injection or scleral icterus.  Lungs: Resp effort normal Cardiac: Regular rate Abdomen: Non-distended abdomen Skin: No rashes cyanosis or petechiae. Extremities: No clubbing or edema  Neurologic Exam: Mental Status: Awake, alert, attentive to examiner. Oriented to self and environment. Language is fluent with intact comprehension.  Cranial Nerves: Visual acuity is grossly normal. Visual fields are full. Extra-ocular movements intact. No ptosis. Face is symmetric Motor: Tone and bulk are normal. Power is full in both arms and legs. Reflexes are symmetric, no pathologic reflexes present.  Sensory: Intact to light touch Gait: Normal.   Labs: I have reviewed the data as listed     Component Value Date/Time   NA 140 12/18/2023 1032   K 4.4 12/18/2023 1032   CL 103 12/18/2023 1032   CO2 29 12/18/2023 1032   GLUCOSE 103 (H) 12/18/2023 1032   BUN 10 12/18/2023 1032   CREATININE 0.79 12/18/2023 1032   CREATININE 0.85 12/09/2023 0856   CREATININE 0.78 12/08/2019 0819   CALCIUM  9.8 12/18/2023 1032   PROT 6.9 12/18/2023 1032   ALBUMIN  4.4 12/18/2023 1032   AST 20 12/18/2023 1032   AST 23 12/09/2023 0856   ALT 21 12/18/2023 1032   ALT 23 12/09/2023 0856   ALKPHOS 69 12/18/2023 1032   BILITOT 1.4 (H) 12/18/2023 1032   BILITOT 1.2 12/09/2023 0856   GFRNONAA >60 12/09/2023 0856   GFRNONAA 88 12/08/2019 0819   GFRAA 102 12/08/2019 0819   Lab Results  Component Value Date   WBC 3.9 (L) 12/18/2023   NEUTROABS 1.8 12/09/2023   HGB 12.8 (L) 12/18/2023   HCT 37.5 (L) 12/18/2023   MCV 94.3 12/18/2023   PLT 175.0 12/18/2023    Imaging:  MR Brain W Wo Contrast Result Date: 12/24/2023 EXAM: MRI BRAIN WITH AND WITHOUT CONTRAST 12/24/2023 12:20:27 PM TECHNIQUE: Multiplanar multisequence MRI of the head/brain was performed with and without the administration of intravenous contrast. COMPARISON: MRI of the brain dated 09/16/2023. CLINICAL HISTORY: Brain metastases, assess treatment response; 3T SRS Protocol. Following punctate untreated hippocampal lesion. FINDINGS: BRAIN AND VENTRICLES: No acute infarct. No acute intracranial hemorrhage. No mass effect or midline shift. No hydrocephalus. The sella is unremarkable. Normal flow voids. Mild residual irregular reticular enhancement again seen involving the left parietal resection cavity, which appears to be marginally improved. The lesion within the left hippocampus is again demonstrated on image 62 of series 14 and appears to be unchanged. There is a new lesion present medially within the left frontal lobe, which is well demonstrated on image 101 of series 13, measuring approximately 3 mm in diameter. There is age-related atrophy  and mild-to-moderate periventricular and subcortical white matter disease. ORBITS: No acute abnormality. SINUSES: No acute abnormality. BONES AND SOFT TISSUES: Normal bone marrow signal and enhancement. No acute soft tissue abnormality. IMPRESSION: 1. New 3 mm enhancing nodular lesion medially within the left frontal lobe concerning for metastatic disease. 2. Mild residual irregular reticular enhancement involving the left parietal resection cavity, marginally  improved. 3. Unchanged punctate lesion within the left hippocampus. 4. Age-related atrophy and mild-to-moderate periventricular and subcortical white matter disease. Electronically signed by: Evalene Coho MD 12/24/2023 12:49 PM EST RP Workstation: [id number]   CT CHEST ABDOMEN PELVIS W CONTRAST Result Date: 12/17/2023 EXAM: CT CHEST ABDOMEN PELVIS WITH THORACIC AND LUMBAR SPINE RECONSTRUCTIONS 12/09/2023 10:15:13 AM TECHNIQUE: CT of the chest, abdomen, pelvis was performed after the administration of 100 mL of iohexol  (OMNIPAQUE ) 300 MG/ML solution. Multiplanar reformatted images are provided for review, including reconstructed images of the thoracic and lumbar spine. Automated exposure control, iterative reconstruction, and/or weight based adjustment of the mA/kV was utilized to reduce the radiation dose to as low as reasonably achievable. COMPARISON: Multiple exams including 08/11/2023 and PET CT of 09/11/2023. CLINICAL HISTORY: Non-small cell lung cancer restaging. FINDINGS: CT CHEST: THORACIC AORTA: Thoracic aortic, coronary artery, and branch vessel atheromatous vascular calcifications. No acute traumatic injury of the aorta. MEDIASTINUM: Right index hilar node 1.1 cm in short axis, previously 1.3 cm on 08/11/2023. No mediastinal hematoma or pneumomediastinum. No acute traumatic injury to the heart or pericardium. The central airways are clear. LUNGS: Temporal right apical scarring. Old granulomatous disease. Stable 7 x 7 mm left lower lobe nodule  on image 95 series 4. New mild subpleural nodularity medially in the right lower lobe measuring 7 x 4 mm on image 95 series 4. Stable 4 mm left lower lobe nodule on image 108 series 4. Reduced size of a 4 mm right lower lobe nodule on image 133 series 4, previously measuring 7 x 7 mm. Stable 8 x 4 mm right lower lobe nodule on image 123 of series 4. No acute traumatic injury to the lungs. No pulmonary contusion or laceration. No effusion or pneumothorax. CHEST WALL: No acute displaced rib fracture. No chest wall hematoma. CT ABDOMEN AND PELVIS: ABDOMINAL AORTA: Systemic atherosclerosis is present, including the aorta and iliac arteries. Atheromatous plaque along the origin of the celiac artery and proximally in the superior mesenteric artery. No acute traumatic injury of the aorta or iliac arteries. HEPATOBILIARY: No acute traumatic injury. SPLEEN: No acute traumatic injury. PANCREAS: No acute traumatic injury. ADRENAL GLANDS: No acute traumatic injury. KIDNEYS: Bilateral scattered simple renal cysts warrant no further imaging workup. Per consensus, no follow-up is needed for simple Bosniak type 1 and 2 renal cysts, unless the patient has a malignancy history or risk factors. No acute traumatic injury. No hydronephrosis. GI TRACT: Percutaneous hernia mass along the anterior pelvic wall. No acute traumatic injury of the bowel. No bowel obstruction. PERITONEUM: No ascites or free air. RETROPERITONEUM: No retroperitoneal hematoma. BLADDER: Mild urinary bladder wall thickening could be from nondistention or cystitis. No acute abnormality. REPRODUCTIVE ORGANS: No acute abnormality. BONES: Moderate degenerative arthropathy in both hips. No acute traumatic fracture of the pelvis. THORACIC AND LUMBAR SPINE: BONES AND ALIGNMENT: Interval 15% superior endplate compression fracture at T12. No traumatic malalignment. DEGENERATIVE CHANGES: No severe spinal canal stenosis or bony neural foraminal narrowing. SOFT TISSUES: No  paraspinal mass or hematoma. IMPRESSION: 1. Interval approximately 15% superior endplate compression fracture at T12. 2. New mild subpleural nodularity medially in the right lower lobe measuring 7 x 4 mm. 3. Stable 7 x 7 mm left lower lobe nodule and stable 4 mm left lower lobe nodule. 4. Reduced size of a right lower lobe nodule now 4 mm, previously 7 x 7 mm. 5. Stable 8 x 4 mm right lower lobe nodule. 6. Right hilar lymph node 1.1 cm short axis, decreased  from 1.3 cm. Electronically signed by: Ryan Salvage MD 12/17/2023 11:27 AM EST RP Workstation: [id number]    [facility] Clinician Interpretation: I have personally reviewed the radiological images as listed.  My interpretation, in the context of the patient's clinical presentation, is progressive disease   Assessment/Plan Metastasis to brain [facility])  Noah King is clinically stable today.  MRI brain demonstrates small new focus of enhancement within the left frontal lobe, consistent with novel metastasis.  Enhancing lesion within hippocampus remains punctate, stable without treatment.  Resection cavity left parietal remains stable as well.  Discussed and recommended proceeding with salvage SRS for the left frontal lesion.  Noah King is agreeable with this.    The radiation oncology team will reach out to him shortly to coordinate CT/sim and treatment date/time.    We spent twenty additional minutes teaching regarding the natural history, biology, and historical experience in the treatment of neurologic complications of cancer.   We appreciate the opportunity to participate in the care of Noah King.   We ask that Noah King return to clinic in 3 months following next brain MRI, or sooner as needed.  All questions were answered. The patient knows to call the clinic with any problems, questions or concerns. No barriers to learning were detected.  The total time spent in the encounter was 40 minutes and more than 50% was on  counseling and review of test results   Arthea MARLA Manns, MD Medical Director of Neuro-Oncology [facility] 12/30/2023 10:14 AM     [1]  Current Outpatient Medications on File Prior to Visit  Medication Sig Dispense Refill   atorvastatin  (LIPITOR) 20 MG tablet Take 1 tablet (20 mg total) by mouth daily. 90 tablet 3   docusate sodium  (COLACE) 100 MG capsule Take 100 mg by mouth daily as needed (constipation).     folic acid  (FOLVITE ) 1 MG tablet Take 1 tablet (1 mg total) by mouth daily. 30 tablet 2   losartan  (COZAAR ) 25 MG tablet Take 1 tablet (25 mg total) by mouth daily. 90 tablet 3   Multiple Vitamins-Minerals (MENS 50+ MULTIVITAMIN) TABS Take 1 tablet by mouth daily.     pantoprazole  (PROTONIX ) 40 MG tablet Take 1 tablet (40 mg total) by mouth daily. 90 tablet 3   prochlorperazine  (COMPAZINE ) 10 MG tablet Take 1 tablet (10 mg total) by mouth every 6 (six) hours as needed. 30 tablet 2   No current facility-administered medications on file prior to visit.  [2]  Allergies Allergen Reactions   Lotensin  [Benazepril  Hcl] Cough

## 2023-12-30 NOTE — Telephone Encounter (Signed)
 Scheduled patient for next appointment. Called and spoke with the patient, he is aware.

## 2023-12-30 NOTE — Progress Notes (Signed)
 Has armband been applied?  {yes no:314532}  Does patient have an allergy to IV contrast dye?: {yes no:314532}   Has patient ever received premedication for IV contrast dye?: {yes no:314532}   Does patient take metformin?: {yes no:314532}  If patient does take metformin when was the last dose: {Time; dates multiple:15870}  Date of lab work: 12/18/2023 BUN: 10 CR: 0.79 eGfr: >60  IV site: {iv locations:314275}  Has IV site been added to flowsheet?  {yes no:314532}  There were no vitals taken for this visit.

## 2023-12-31 ENCOUNTER — Ambulatory Visit
Admission: RE | Admit: 2023-12-31 | Discharge: 2023-12-31 | Attending: Radiation Oncology | Admitting: Radiation Oncology

## 2023-12-31 DIAGNOSIS — C3431 Malignant neoplasm of lower lobe, right bronchus or lung: Secondary | ICD-10-CM

## 2023-12-31 DIAGNOSIS — C7931 Secondary malignant neoplasm of brain: Secondary | ICD-10-CM

## 2023-12-31 NOTE — Progress Notes (Signed)
 Follow up call to discuss results from brain MRI on 12/24/23.  Patient denies any pain or problems today.    IMPRESSION: 1. New 3 mm enhancing nodular lesion medially within the left frontal lobe concerning for metastatic disease. 2. Mild residual irregular reticular enhancement involving the left parietal resection cavity, marginally improved. 3. Unchanged punctate lesion within the left hippocampus. 4. Age-related atrophy and mild-to-moderate periventricular and subcortical white matter disease.

## 2024-01-01 ENCOUNTER — Ambulatory Visit
Admission: RE | Admit: 2024-01-01 | Discharge: 2024-01-01 | Disposition: A | Discharge status: Home / Self Care | Source: Ambulatory Visit | Attending: Radiation Oncology | Admitting: Radiation Oncology

## 2024-01-01 ENCOUNTER — Ambulatory Visit: Admission: RE | Admit: 2024-01-01 | Discharge: 2024-01-01 | Attending: Radiation Oncology

## 2024-01-01 ENCOUNTER — Inpatient Hospital Stay

## 2024-01-01 VITALS — BP 130/74 | HR 105 | Temp 97.7°F | Resp 18 | Ht 70.0 in | Wt 153.5 lb

## 2024-01-01 DIAGNOSIS — C7931 Secondary malignant neoplasm of brain: Secondary | ICD-10-CM

## 2024-01-02 ENCOUNTER — Encounter: Payer: Self-pay | Admitting: Internal Medicine

## 2024-01-02 NOTE — Progress Notes (Signed)
 Pharmacist Chemotherapy Monitoring - Initial Assessment    Anticipated start date: 01/12/24   The following has been reviewed per standard work regarding the patient's treatment regimen: The patient's diagnosis, treatment plan and drug doses, and organ/hematologic function Lab orders and baseline tests specific to treatment regimen  The treatment plan start date, drug sequencing, and pre-medications Prior authorization status  Patient's documented medication list, including drug-drug interaction screen and prescriptions for anti-emetics and supportive care specific to the treatment regimen The drug concentrations, fluid compatibility, administration routes, and timing of the medications to be used The patient's access for treatment and lifetime cumulative dose history, if applicable  The patient's medication allergies and previous infusion related reactions, if applicable   Changes made to treatment plan:  N/A  Follow up needed:  Pending authorization for treatment   Noah King, PharmD, MBA

## 2024-01-04 DIAGNOSIS — C7931 Secondary malignant neoplasm of brain: Secondary | ICD-10-CM | POA: Diagnosis not present

## 2024-01-06 ENCOUNTER — Other Ambulatory Visit: Payer: Self-pay

## 2024-01-06 ENCOUNTER — Inpatient Hospital Stay

## 2024-01-06 ENCOUNTER — Ambulatory Visit
Admission: RE | Admit: 2024-01-06 | Discharge: 2024-01-06 | Disposition: A | Discharge status: Home / Self Care | Source: Ambulatory Visit | Attending: Radiation Oncology | Admitting: Radiation Oncology

## 2024-01-06 DIAGNOSIS — C7931 Secondary malignant neoplasm of brain: Secondary | ICD-10-CM | POA: Diagnosis not present

## 2024-01-06 LAB — RAD ONC ARIA SESSION SUMMARY
Course Elapsed Days: 0
Plan Fractions Treated to Date: 1
Plan Prescribed Dose Per Fraction: 20 Gy
Plan Total Fractions Prescribed: 1
Plan Total Prescribed Dose: 20 Gy
Reference Point Dosage Given to Date: 20 Gy
Reference Point Session Dosage Given: 20 Gy
Session Number: 1

## 2024-01-06 NOTE — Progress Notes (Signed)
 Noah King rested with us  for 15 minutes following his SRS treatment.  Patient denies headache, dizziness, nausea, diplopia or ringing in the ears. Denies fatigue. Patient without complaints. Understands to avoid strenuous activity for the next 24 hours and call [phone] with needs.   BP 127/71   Pulse (!) 105   Temp 98.5 F (36.9 C) (Oral)   Resp 14   SpO2 97%

## 2024-01-07 NOTE — Radiation Completion Notes (Addendum)
" °  Radiation Oncology         [phone] ________________________________  Name: ARISTIDES LUCKEY MRN: [id number]  Date of Service: 01/06/2024  DOB: 08/06/1945  End of Treatment Note   Diagnosis:   Progressive Stage IV, [id number], NSCLC, NOS of the RLL with brain disease   Intent: Palliative     ==========DELIVERED PLANS==========  First Treatment Date: 2024-01-06 Last Treatment Date: 2024-01-06   Plan Name: Brain_SRS_dca Site: Brain [id number] Technique: SBRT/SRT-3D Mode: Photon Dose Per Fraction: 20 Gy Prescribed Dose (Delivered / Prescribed): 20 Gy / 20 Gy Prescribed Fxs (Delivered / Prescribed): 1 / 1     ==========ON TREATMENT VISIT DATES========== 2024-01-06    See weekly On Treatment Notes in Epic for details in the Media tab (listed as Progress notes on the On Treatment Visit Dates listed above). The patient tolerated radiation. He developed fatigue and anticipated skin changes in the treatment field.   The patient will receive a call in about one month from the radiation oncology department. He will continue follow up with Dr. Sherrod as well as be followed in the brain oncology program with Dr. Buckley.     Donald KYM Husband, PAC    "

## 2024-01-12 ENCOUNTER — Inpatient Hospital Stay

## 2024-01-12 VITALS — BP 121/76 | HR 103 | Temp 97.6°F | Resp 16 | Wt 147.8 lb

## 2024-01-12 DIAGNOSIS — C3431 Malignant neoplasm of lower lobe, right bronchus or lung: Secondary | ICD-10-CM

## 2024-01-12 DIAGNOSIS — Z5112 Encounter for antineoplastic immunotherapy: Secondary | ICD-10-CM | POA: Diagnosis not present

## 2024-01-12 LAB — CMP (CANCER CENTER ONLY)
ALT: 12 U/L (ref 0–44)
AST: 18 U/L (ref 15–41)
Albumin: 3.9 g/dL (ref 3.5–5.0)
Alkaline Phosphatase: 92 U/L (ref 38–126)
Anion gap: 11 (ref 5–15)
BUN: 11 mg/dL (ref 8–23)
CO2: 26 mmol/L (ref 22–32)
Calcium: 9.3 mg/dL (ref 8.9–10.3)
Chloride: 100 mmol/L (ref 98–111)
Creatinine: 0.75 mg/dL (ref 0.61–1.24)
GFR, Estimated: >60 mL/min
Glucose, Bld: 106 mg/dL — ABNORMAL HIGH (ref 70–99)
Potassium: 4 mmol/L (ref 3.5–5.1)
Sodium: 137 mmol/L (ref 135–145)
Total Bilirubin: 0.8 mg/dL (ref 0.0–1.2)
Total Protein: 7 g/dL (ref 6.5–8.1)

## 2024-01-12 LAB — CBC WITH DIFFERENTIAL (CANCER CENTER ONLY)
Abs Immature Granulocytes: 0.01 K/uL (ref 0.00–0.07)
Basophils Absolute: 0 K/uL (ref 0.0–0.1)
Basophils Relative: 1 %
Eosinophils Absolute: 0.2 K/uL (ref 0.0–0.5)
Eosinophils Relative: 5 %
HCT: 34.7 % — ABNORMAL LOW (ref 39.0–52.0)
Hemoglobin: 12 g/dL — ABNORMAL LOW (ref 13.0–17.0)
Immature Granulocytes: 0 %
Lymphocytes Relative: 12 %
Lymphs Abs: 0.5 K/uL — ABNORMAL LOW (ref 0.7–4.0)
MCH: 31 pg (ref 26.0–34.0)
MCHC: 34.6 g/dL (ref 30.0–36.0)
MCV: 89.7 fL (ref 80.0–100.0)
Monocytes Absolute: 0.6 K/uL (ref 0.1–1.0)
Monocytes Relative: 14 %
Neutro Abs: 3 K/uL (ref 1.7–7.7)
Neutrophils Relative %: 68 %
Platelet Count: 248 K/uL (ref 150–400)
RBC: 3.87 MIL/uL — ABNORMAL LOW (ref 4.22–5.81)
RDW: 12.6 % (ref 11.5–15.5)
WBC Count: 4.4 K/uL (ref 4.0–10.5)
nRBC: 0 % (ref 0.0–0.2)

## 2024-01-12 LAB — TSH: TSH: 1.05 u[IU]/mL (ref 0.350–4.500)

## 2024-01-12 MED ORDER — APREPITANT 130 MG/18ML IV EMUL
130.0000 mg | Freq: Once | INTRAVENOUS | Status: AC
  Administered 2024-01-12: 130 mg via INTRAVENOUS
  Filled 2024-01-12: qty 18

## 2024-01-12 MED ORDER — PALONOSETRON HCL INJECTION 0.25 MG/5ML
0.2500 mg | Freq: Once | INTRAVENOUS | Status: AC
  Administered 2024-01-12: 0.25 mg via INTRAVENOUS
  Filled 2024-01-12: qty 5

## 2024-01-12 MED ORDER — SODIUM CHLORIDE 0.9 % IV SOLN: INTRAVENOUS | Status: DC

## 2024-01-12 MED ORDER — SODIUM CHLORIDE 0.9 % IV SOLN
426.5000 mg | Freq: Once | INTRAVENOUS | Status: AC
  Administered 2024-01-12: 430 mg via INTRAVENOUS
  Filled 2024-01-12: qty 43

## 2024-01-12 MED ORDER — SODIUM CHLORIDE 0.9 % IV SOLN
200.0000 mg | Freq: Once | INTRAVENOUS | Status: AC
  Administered 2024-01-12: 200 mg via INTRAVENOUS
  Filled 2024-01-12: qty 200

## 2024-01-12 MED ORDER — SODIUM CHLORIDE 0.9 % IV SOLN
500.0000 mg/m2 | Freq: Once | INTRAVENOUS | Status: AC
  Administered 2024-01-12: 900 mg via INTRAVENOUS
  Filled 2024-01-12: qty 20

## 2024-01-12 MED ORDER — DEXAMETHASONE SOD PHOSPHATE PF 10 MG/ML IJ SOLN
10.0000 mg | Freq: Once | INTRAMUSCULAR | Status: AC
  Administered 2024-01-12: 10 mg via INTRAVENOUS

## 2024-01-12 NOTE — Patient Instructions (Signed)
 [facility] - A DEPT OF [place]  Discharge Instructions: Thank you for choosing [facility] to provide your oncology and hematology care.   If you have a lab appointment with the Cancer Center, please go directly to the Cancer Center and check in at the registration area.   Wear comfortable clothing and clothing appropriate for easy access to any Portacath or PICC line.   We strive to give you quality time with your provider. You may need to reschedule your appointment if you arrive late (15 or more minutes).  Arriving late affects you and other patients whose appointments are after yours.  Also, if you miss three or more appointments without notifying the office, you may be dismissed from the clinic at the providers discretion.      For prescription refill requests, have your pharmacy contact our office and allow 72 hours for refills to be completed.    Today you received the following chemotherapy and/or immunotherapy agents: Keytruda , Alimta , Carboplatin       To help prevent nausea and vomiting after your treatment, we encourage you to take your nausea medication as directed.  BELOW ARE SYMPTOMS THAT SHOULD BE REPORTED IMMEDIATELY: *FEVER GREATER THAN 100.4 F (38 C) OR HIGHER *CHILLS OR SWEATING *NAUSEA AND VOMITING THAT IS NOT CONTROLLED WITH YOUR NAUSEA MEDICATION *UNUSUAL SHORTNESS OF BREATH *UNUSUAL BRUISING OR BLEEDING *URINARY PROBLEMS (pain or burning when urinating, or frequent urination) *BOWEL PROBLEMS (unusual diarrhea, constipation, pain near the anus) TENDERNESS IN MOUTH AND THROAT WITH OR WITHOUT PRESENCE OF ULCERS (sore throat, sores in mouth, or a toothache) UNUSUAL RASH, SWELLING OR PAIN  UNUSUAL VAGINAL DISCHARGE OR ITCHING   Items with * indicate a potential emergency and should be followed up as soon as possible or go to the Emergency Department if any problems should occur.  Please show the CHEMOTHERAPY ALERT  CARD or IMMUNOTHERAPY ALERT CARD at check-in to the Emergency Department and triage nurse.  Should you have questions after your visit or need to cancel or reschedule your appointment, please contact [facility] - A DEPT OF [facility]  Dept: [phone]  and follow the prompts.  Office hours are 8:00 a.m. to 4:30 p.m. Monday - Friday. Please note that voicemails left after 4:00 p.m. may not be returned until the following business day.  We are closed weekends and major holidays. You have access to a nurse at all times for urgent questions. Please call the main number to the clinic Dept: [phone] and follow the prompts.   For any non-urgent questions, you may also contact your provider using MyChart. We now offer e-Visits for anyone 41 and older to request care online for non-urgent symptoms. For details visit [email or web address].   Also download the MyChart app! Go to the app store, search MyChart, open the app, select [place], and log in with your MyChart username and password.  Pemetrexed  Injection What is this medication? PEMETREXED  (PEM e TREX ed) treats some types of cancer. It works by slowing down the growth of cancer cells. This medicine may be used for other purposes; ask your health care provider or pharmacist if you have questions. COMMON BRAND NAME(S): Alimta , PEMFEXY, PEMRYDI RTU What should I tell my care team before I take this medication? They need to know if you have any of these conditions: Infection, such as chickenpox, cold sores, or herpes Kidney disease Low blood cell levels (white  cells, red cells, and platelets) Lung or breathing disease, such as asthma Radiation therapy An unusual or allergic reaction to pemetrexed , other medications, foods, dyes, or preservatives If you or your partner are pregnant or trying to get pregnant Breast-feeding How should I use this medication? This medication is injected into a vein. It is  given by your care team in a hospital or clinic setting. Talk to your care team about the use of this medication in children. Special care may be needed. Overdosage: If you think you have taken too much of this medicine contact a poison control center or emergency room at once. NOTE: This medicine is only for you. Do not share this medicine with others. What if I miss a dose? Keep appointments for follow-up doses. It is important not to miss your dose. Call your care team if you are unable to keep an appointment. What may interact with this medication? Do not take this medication with any of the following: Live virus vaccines This medication may also interact with the following: Ibuprofen This list may not describe all possible interactions. Give your health care provider a list of all the medicines, herbs, non-prescription drugs, or dietary supplements you use. Also tell them if you smoke, drink alcohol, or use illegal drugs. Some items may interact with your medicine. What should I watch for while using this medication? Your condition will be monitored carefully while you are receiving this medication. This medication may make you feel generally unwell. This is not uncommon as chemotherapy can affect healthy cells as well as cancer cells. Report any side effects. Continue your course of treatment even though you feel ill unless your care team tells you to stop. This medication can cause serious side effects. To reduce the risk, your care team may give you other medications to take before receiving this one. Be sure to follow the directions from your care team. This medication can cause a rash or redness in areas of the body that have previously had radiation therapy. If you have had radiation therapy, tell your care team if you notice a rash in this area. This medication may increase your risk of getting an infection. Call your care team for advice if you get a fever, chills, sore throat, or other  symptoms of a cold or flu. Do not treat yourself. Try to avoid being around people who are sick. Be careful brushing or flossing your teeth or using a toothpick because you may get an infection or bleed more easily. If you have any dental work done, tell your dentist you are receiving this medication. Avoid taking medications that contain aspirin, acetaminophen , ibuprofen, naproxen, or ketoprofen unless instructed by your care team. These medications may hide a fever. Check with your care team if you have severe diarrhea, nausea, and vomiting, or if you sweat a lot. The loss of too much body fluid may make it dangerous for you to take this medication. Talk to your care team if you or your partner wish to become pregnant or think either of you might be pregnant. This medication can cause serious birth defects if taken during pregnancy and for 6 months after the last dose. A negative pregnancy test is required before starting this medication. A reliable form of contraception is recommended while taking this medication and for 6 months after the last dose. Talk to your care team about reliable forms of contraception. Do not father a child while taking this medication and for 3 months after the  last dose. Use a condom while having sex during this time period. Do not breastfeed while taking this medication and for 1 week after the last dose. This medication may cause infertility. Talk to your care team if you are concerned about your fertility. What side effects may I notice from receiving this medication? Side effects that you should report to your care team as soon as possible: Allergic reactions--skin rash, itching, hives, swelling of the face, lips, tongue, or throat Dry cough, shortness of breath or trouble breathing Infection--fever, chills, cough, sore throat, wounds that don't heal, pain or trouble when passing urine, general feeling of discomfort or being unwell Kidney injury--decrease in the amount  of urine, swelling of the ankles, hands, or feet Low red blood cell level--unusual weakness or fatigue, dizziness, headache, trouble breathing Redness, blistering, peeling, or loosening of the skin, including inside the mouth Unusual bruising or bleeding Side effects that usually do not require medical attention (report to your care team if they continue or are bothersome): Fatigue Loss of appetite Nausea Vomiting This list may not describe all possible side effects. Call your doctor for medical advice about side effects. You may report side effects to FDA at 1-800-FDA-1088. Where should I keep my medication? This medication is given in a hospital or clinic. It will not be stored at home. NOTE: This sheet is a summary. It may not cover all possible information. If you have questions about this medicine, talk to your doctor, pharmacist, or health care provider.  2024 Elsevier/Gold Standard (2021-05-08 00:00:00)

## 2024-01-13 ENCOUNTER — Telehealth: Payer: Self-pay

## 2024-01-13 LAB — T4: T4, Total: 8.1 ug/dL (ref 4.5–12.0)

## 2024-01-13 NOTE — Telephone Encounter (Signed)
 Spoke with patient this morning regarding night sweats. Patient reports receiving treatment yesterday and experiencing night sweats for the first time last night, requiring 1 clothing change throughout the night. Patient denies fever or any other associated symptoms. Per Dr. Sherrod, the night sweats may be related to the Decadron  administered with yesterdays treatment. Patient was advised to monitor symptoms at this time and to contact the office if symptoms worsen. Encouraged patient to maintain adequate hydration. Patient voiced understanding.

## 2024-01-25 ENCOUNTER — Inpatient Hospital Stay (HOSPITAL_COMMUNITY)
Admission: EM | Admit: 2024-01-25 | Discharge: 2024-02-05 | DRG: 177 | Disposition: A | Discharge status: Hospice | Attending: Internal Medicine | Admitting: Internal Medicine

## 2024-01-25 ENCOUNTER — Emergency Department (HOSPITAL_COMMUNITY)

## 2024-01-25 ENCOUNTER — Other Ambulatory Visit: Payer: Self-pay

## 2024-01-25 DIAGNOSIS — E876 Hypokalemia: Secondary | ICD-10-CM | POA: Diagnosis present

## 2024-01-25 DIAGNOSIS — J850 Gangrene and necrosis of lung: Principal | ICD-10-CM | POA: Diagnosis present

## 2024-01-25 DIAGNOSIS — T451X5A Adverse effect of antineoplastic and immunosuppressive drugs, initial encounter: Secondary | ICD-10-CM | POA: Diagnosis present

## 2024-01-25 DIAGNOSIS — I10 Essential (primary) hypertension: Secondary | ICD-10-CM | POA: Diagnosis present

## 2024-01-25 DIAGNOSIS — Z66 Do not resuscitate: Secondary | ICD-10-CM | POA: Diagnosis present

## 2024-01-25 DIAGNOSIS — J189 Pneumonia, unspecified organism: Principal | ICD-10-CM | POA: Diagnosis present

## 2024-01-25 DIAGNOSIS — D709 Neutropenia, unspecified: Secondary | ICD-10-CM

## 2024-01-25 DIAGNOSIS — Z7189 Other specified counseling: Secondary | ICD-10-CM

## 2024-01-25 DIAGNOSIS — Z888 Allergy status to other drugs, medicaments and biological substances status: Secondary | ICD-10-CM

## 2024-01-25 DIAGNOSIS — G9341 Metabolic encephalopathy: Secondary | ICD-10-CM | POA: Diagnosis present

## 2024-01-25 DIAGNOSIS — Z79899 Other long term (current) drug therapy: Secondary | ICD-10-CM

## 2024-01-25 DIAGNOSIS — C3431 Malignant neoplasm of lower lobe, right bronchus or lung: Secondary | ICD-10-CM | POA: Diagnosis not present

## 2024-01-25 DIAGNOSIS — Z85118 Personal history of other malignant neoplasm of bronchus and lung: Secondary | ICD-10-CM

## 2024-01-25 DIAGNOSIS — C7931 Secondary malignant neoplasm of brain: Secondary | ICD-10-CM | POA: Diagnosis present

## 2024-01-25 DIAGNOSIS — Z6823 Body mass index (BMI) 23.0-23.9, adult: Secondary | ICD-10-CM

## 2024-01-25 DIAGNOSIS — Z515 Encounter for palliative care: Secondary | ICD-10-CM

## 2024-01-25 DIAGNOSIS — R627 Adult failure to thrive: Secondary | ICD-10-CM | POA: Diagnosis present

## 2024-01-25 DIAGNOSIS — J918 Pleural effusion in other conditions classified elsewhere: Secondary | ICD-10-CM | POA: Diagnosis present

## 2024-01-25 DIAGNOSIS — D84821 Immunodeficiency due to drugs: Secondary | ICD-10-CM | POA: Diagnosis present

## 2024-01-25 DIAGNOSIS — R630 Anorexia: Secondary | ICD-10-CM

## 2024-01-25 DIAGNOSIS — Z923 Personal history of irradiation: Secondary | ICD-10-CM

## 2024-01-25 DIAGNOSIS — R4701 Aphasia: Secondary | ICD-10-CM | POA: Diagnosis present

## 2024-01-25 DIAGNOSIS — G9340 Encephalopathy, unspecified: Secondary | ICD-10-CM

## 2024-01-25 DIAGNOSIS — F32A Depression, unspecified: Secondary | ICD-10-CM | POA: Diagnosis present

## 2024-01-25 DIAGNOSIS — E785 Hyperlipidemia, unspecified: Secondary | ICD-10-CM | POA: Diagnosis present

## 2024-01-25 DIAGNOSIS — Z87891 Personal history of nicotine dependence: Secondary | ICD-10-CM

## 2024-01-25 DIAGNOSIS — R54 Age-related physical debility: Secondary | ICD-10-CM | POA: Diagnosis present

## 2024-01-25 DIAGNOSIS — J188 Other pneumonia, unspecified organism: Secondary | ICD-10-CM | POA: Diagnosis present

## 2024-01-25 DIAGNOSIS — Z602 Problems related to living alone: Secondary | ICD-10-CM | POA: Diagnosis present

## 2024-01-25 DIAGNOSIS — K219 Gastro-esophageal reflux disease without esophagitis: Secondary | ICD-10-CM | POA: Diagnosis present

## 2024-01-25 DIAGNOSIS — E43 Unspecified severe protein-calorie malnutrition: Secondary | ICD-10-CM | POA: Diagnosis present

## 2024-01-25 DIAGNOSIS — D61810 Antineoplastic chemotherapy induced pancytopenia: Secondary | ICD-10-CM | POA: Diagnosis present

## 2024-01-25 LAB — CBC WITH DIFFERENTIAL/PLATELET
Abs Immature Granulocytes: 0 K/uL (ref 0.00–0.07)
Basophils Absolute: 0 K/uL (ref 0.0–0.1)
Basophils Relative: 0 %
Eosinophils Absolute: 0 K/uL (ref 0.0–0.5)
Eosinophils Relative: 2 %
HCT: 30.7 % — ABNORMAL LOW (ref 39.0–52.0)
Hemoglobin: 10.7 g/dL — ABNORMAL LOW (ref 13.0–17.0)
Immature Granulocytes: 0 %
Lymphocytes Relative: 16 %
Lymphs Abs: 0.3 K/uL — ABNORMAL LOW (ref 0.7–4.0)
MCH: 31.1 pg (ref 26.0–34.0)
MCHC: 34.9 g/dL (ref 30.0–36.0)
MCV: 89.2 fL (ref 80.0–100.0)
Monocytes Absolute: 0.4 K/uL (ref 0.1–1.0)
Monocytes Relative: 21 %
Neutro Abs: 1.2 K/uL — ABNORMAL LOW (ref 1.7–7.7)
Neutrophils Relative %: 61 %
Platelets: 105 K/uL — ABNORMAL LOW (ref 150–400)
RBC: 3.44 MIL/uL — ABNORMAL LOW (ref 4.22–5.81)
RDW: 12 % (ref 11.5–15.5)
WBC: 1.9 K/uL — ABNORMAL LOW (ref 4.0–10.5)
nRBC: 0 % (ref 0.0–0.2)

## 2024-01-25 LAB — BASIC METABOLIC PANEL WITH GFR
Anion gap: 12 (ref 5–15)
BUN: 14 mg/dL (ref 8–23)
CO2: 27 mmol/L (ref 22–32)
Calcium: 9.1 mg/dL (ref 8.9–10.3)
Chloride: 94 mmol/L — ABNORMAL LOW (ref 98–111)
Creatinine, Ser: 0.82 mg/dL (ref 0.61–1.24)
GFR, Estimated: >60 mL/min
Glucose, Bld: 96 mg/dL (ref 70–99)
Potassium: 3.8 mmol/L (ref 3.5–5.1)
Sodium: 132 mmol/L — ABNORMAL LOW (ref 135–145)

## 2024-01-25 LAB — RESP PANEL BY RT-PCR (RSV, FLU A&B, COVID)  RVPGX2
Influenza A by PCR: NEGATIVE
Influenza B by PCR: NEGATIVE
Resp Syncytial Virus by PCR: NEGATIVE
SARS Coronavirus 2 by RT PCR: NEGATIVE

## 2024-01-25 LAB — TROPONIN T, HIGH SENSITIVITY: Troponin T High Sensitivity: <15 ng/L (ref 0–19)

## 2024-01-25 LAB — MRSA NEXT GEN BY PCR, NASAL: MRSA by PCR Next Gen: NOT DETECTED

## 2024-01-25 MED ORDER — ENOXAPARIN SODIUM 40 MG/0.4ML IJ SOSY
40.0000 mg | PREFILLED_SYRINGE | INTRAMUSCULAR | Status: DC
  Administered 2024-01-25 – 2024-02-03 (×10): 40 mg via SUBCUTANEOUS
  Filled 2024-01-25 (×10): qty 0.4

## 2024-01-25 MED ORDER — GUAIFENESIN ER 600 MG PO TB12
600.0000 mg | ORAL_TABLET | Freq: Two times a day (BID) | ORAL | Status: DC
  Administered 2024-01-25 – 2024-02-04 (×18): 600 mg via ORAL
  Filled 2024-01-25 (×20): qty 1

## 2024-01-25 MED ORDER — PROCHLORPERAZINE MALEATE 10 MG PO TABS: 10.0000 mg | ORAL_TABLET | Freq: Four times a day (QID) | ORAL | Status: DC | PRN

## 2024-01-25 MED ORDER — SODIUM CHLORIDE 0.9 % IV SOLN
2.0000 g | Freq: Once | INTRAVENOUS | Status: AC
  Administered 2024-01-25: 2 g via INTRAVENOUS
  Filled 2024-01-25: qty 20

## 2024-01-25 MED ORDER — SENNOSIDES-DOCUSATE SODIUM 8.6-50 MG PO TABS: 1.0000 | ORAL_TABLET | Freq: Every evening | ORAL | Status: DC | PRN

## 2024-01-25 MED ORDER — ACETAMINOPHEN 650 MG RE SUPP: 650.0000 mg | Freq: Four times a day (QID) | RECTAL | Status: DC | PRN

## 2024-01-25 MED ORDER — ACETAMINOPHEN 325 MG PO TABS
650.0000 mg | ORAL_TABLET | Freq: Four times a day (QID) | ORAL | Status: DC | PRN
  Administered 2024-01-31 – 2024-02-01 (×2): 650 mg via ORAL
  Filled 2024-01-25 (×2): qty 2

## 2024-01-25 MED ORDER — DOCUSATE SODIUM 100 MG PO CAPS: 100.0000 mg | ORAL_CAPSULE | Freq: Every day | ORAL | Status: DC | PRN

## 2024-01-25 MED ORDER — SODIUM CHLORIDE 0.9 % IV SOLN
2.0000 g | INTRAVENOUS | Status: DC
  Administered 2024-01-26 – 2024-01-27 (×2): 2 g via INTRAVENOUS
  Filled 2024-01-25 (×2): qty 20

## 2024-01-25 MED ORDER — SODIUM CHLORIDE 0.9 % IV SOLN: INTRAVENOUS | Status: AC

## 2024-01-25 MED ORDER — PANTOPRAZOLE SODIUM 40 MG PO TBEC
40.0000 mg | DELAYED_RELEASE_TABLET | Freq: Every day | ORAL | Status: DC
  Administered 2024-01-25 – 2024-02-04 (×10): 40 mg via ORAL
  Filled 2024-01-25 (×10): qty 1

## 2024-01-25 MED ORDER — LACTATED RINGERS IV BOLUS
1000.0000 mL | Freq: Once | INTRAVENOUS | Status: AC
  Administered 2024-01-25: 1000 mL via INTRAVENOUS

## 2024-01-25 MED ORDER — ATORVASTATIN CALCIUM 20 MG PO TABS
20.0000 mg | ORAL_TABLET | Freq: Every day | ORAL | Status: DC
  Administered 2024-01-26 – 2024-02-04 (×9): 20 mg via ORAL
  Filled 2024-01-25 (×9): qty 1

## 2024-01-25 MED ORDER — SODIUM CHLORIDE 0.9 % IV SOLN
100.0000 mg | Freq: Two times a day (BID) | INTRAVENOUS | Status: DC
  Administered 2024-01-25 – 2024-01-28 (×6): 100 mg via INTRAVENOUS
  Filled 2024-01-25 (×7): qty 100

## 2024-01-25 MED ORDER — ALBUTEROL SULFATE (2.5 MG/3ML) 0.083% IN NEBU: 2.5000 mg | INHALATION_SOLUTION | Freq: Four times a day (QID) | RESPIRATORY_TRACT | Status: DC | PRN

## 2024-01-25 MED ORDER — FOLIC ACID 1 MG PO TABS
1.0000 mg | ORAL_TABLET | Freq: Every day | ORAL | Status: DC
  Administered 2024-01-26 – 2024-02-04 (×9): 1 mg via ORAL
  Filled 2024-01-25 (×9): qty 1

## 2024-01-25 MED ORDER — AZITHROMYCIN 250 MG PO TABS
500.0000 mg | ORAL_TABLET | Freq: Every day | ORAL | Status: DC
  Administered 2024-01-25: 500 mg via ORAL
  Filled 2024-01-25: qty 2

## 2024-01-25 NOTE — Assessment & Plan Note (Signed)
 Resume statin

## 2024-01-25 NOTE — Assessment & Plan Note (Addendum)
 With mets to brain s/p craniotomy and resection of left parietal lesion, with new 3mm left front lobe lesion on MRI 12/23/24, also palliative radiation. Follows with Dr. Sherrod and Buckley.   Last chemo on 01/12/24. --Consider Palliative Care consult

## 2024-01-25 NOTE — H&P (Signed)
 "  Telemedicine History and Physical    Patient: Noah King FMW:[id number] DOB: May 31, 1945 DOA: 01/25/2024 DOS: the patient was seen and examined on 01/25/2024 PCP: Merna Huxley, NP   Referring Provider: Marry Kidney, PA-C Telemedicine Provider: Burnard Cunning, DO Patient Location: [facility] 10 Referring Diagnosis: Right lower lobe pneumonia, Generalized weakness Patient Name and DOB verified: Noah King, Jul 28, 1945 Patient consented to Telemedicine Evaluation: Yes RN virtual assistant: Annabella Hutchinson, RN Video encounter time and date: 01/25/2024 1:38   Patient coming from: Home  Chief Complaint:  Chief Complaint  Patient presents with   Weakness   HPI: Noah King is a 79 y.o. male with medical history significant of lung cancer with brain mets s/p resection and palliative radiation, on chemotherapy, HTN, HLD who presented to [facility] via EMS for evaluation of profound generalized weakness, poor appetite, mild confusion for the past 1-2 weeks.  Patient's close friend Consuelo has been staying with him since his chemo treatment on 29th and assists with history.  Since that time, patient has become progressively more weak and has not been eating or drinking much of anything.  Pt has a cough that is apparently stable and chronic for over 20 years, has not seemed to worsen recently.  Pt denies feeling feverish or having chills.  No sore throat or congestion.  Quit smoking 40 years ago.  Consuelo reports patient has not been off the couch since Thursday.  He's had some confusion as well, intermittently, mild.  He denies abdominal pain, nausea/vomiting, diarrhea or constipation.  No dysuria of difficulty voiding, but hasn't voided much due to not drinking fluids.  ED course -initial vitals 60F, HR 97, RR 16, BP 102/73, SpO2 92% on room air. Labs obtained including BMP and CBC were notable for sodium 132, chloride 94, WBC 1.9 (from 4.4 on 12/29), hemoglobin 10.7  (from 12.0), platelets 105 (from 248).  Troponin was normal less than 15. UA is pending.  Imaging- - CT head without contrast showed no acute findings, showed mild chronic small vessel ischemic disease. - Two-view chest x-ray showed right lower lobe pneumonia with a small right parapneumonic effusion, probable involvement of the right perihilar region.  Patient was treated in the ED with IV Rocephin  and doxycycline , 1 L bolus LR fluids.  Due to the degree of patient's weakness, living alone and pneumonia in the setting of immunosuppression from chemotherapy and underlying lung cancer, patient is being admitted to the hospital for observation and further IV antibiotics and fluids as outlined in detail below.    Review of Systems: As mentioned in the history of present illness. All other systems reviewed and are negative.   Past Medical History:  Diagnosis Date   Allergy    Cataract    left  extraction- right eye forming   Dupuytren's contracture    GERD (gastroesophageal reflux disease)    Hiatal hernia    past hx-  suspect at one time   Hypertension    Polycythemia    Past Surgical History:  Procedure Laterality Date   APPLICATION OF CRANIAL NAVIGATION Left 08/21/2023   Procedure: COMPUTER-ASSISTED NAVIGATION, FOR CRANIAL PROCEDURE;  Surgeon: Debby Dorn MATSU, MD;  Location: [facility] OR;  Service: Neurosurgery;  Laterality: Left;   cataract extraction left eye     COLONOSCOPY     CRANIOTOMY Left 08/21/2023   Procedure: CRANIOTOMY TUMOR EXCISION;  Surgeon: Debby Dorn MATSU, MD;  Location: [facility] OR;  Service: Neurosurgery;  Laterality: Left;  LT  CRANI FOR TUMOR RESECTION   DUPUYTREN CONTRACTURE RELEASE Left    HERNIA REPAIR  2024   POLYPECTOMY     rt knee arthroscopic     VIDEO BRONCHOSCOPY WITH ENDOBRONCHIAL ULTRASOUND Right 08/13/2023   Procedure: BRONCHOSCOPY, WITH EBUS;  Surgeon: Jude Harden GAILS, MD;  Location: [facility] ENDOSCOPY;  Service: Cardiopulmonary;  Laterality: Right;   Social  History:  reports that he quit smoking about 39 years ago. His smoking use included cigarettes. He has never used smokeless tobacco. He reports current alcohol use of about 20.0 standard drinks of alcohol per week. He reports that he does not use drugs.  Allergies[1]  Family History  Problem Relation Age of Onset   Stroke Mother    Colon cancer Father 66       d. 62   Breast cancer Sister    Colon polyps Neg Hx    Esophageal cancer Neg Hx     Prior to Admission medications  Medication Sig Start Date End Date Taking? Authorizing Provider  atorvastatin  (LIPITOR) 20 MG tablet Take 1 tablet (20 mg total) by mouth daily. 12/18/23   Nafziger, Darleene, NP  docusate sodium  (COLACE) 100 MG capsule Take 100 mg by mouth daily as needed (constipation).    [provider]  folic acid  (FOLVITE ) 1 MG tablet Take 1 tablet (1 mg total) by mouth daily. 12/24/23   Heilingoetter, Cassandra L, PA-C  losartan  (COZAAR ) 25 MG tablet Take 1 tablet (25 mg total) by mouth daily. 12/18/23   Nafziger, Darleene, NP  Multiple Vitamins-Minerals (MENS 50+ MULTIVITAMIN) TABS Take 1 tablet by mouth daily.    [provider]  pantoprazole  (PROTONIX ) 40 MG tablet Take 1 tablet (40 mg total) by mouth daily. 12/18/23   Nafziger, Darleene, NP  prochlorperazine  (COMPAZINE ) 10 MG tablet Take 1 tablet (10 mg total) by mouth every 6 (six) hours as needed. 12/24/23   Heilingoetter, Cassandra L, PA-C    Physical Exam: Vitals:   01/25/24 1230 01/25/24 1245 01/25/24 1300 01/25/24 1426  BP: (!) 115/92  134/64 136/62  Pulse: 72 74  84  Resp:  18  17  Temp:    98.7 F (37.1 C)  TempSrc:      SpO2: 99% 100%  96%   Bedside physical exam was performed by RN listed above. Below exam findings are based on their in person physical exam findings and my observations during virtual encounter.  General exam: awake, alert, no acute distress, mildly confused, ill-appearing HEENT: , hearing grossly normal  Respiratory system: CTAB  diminished bases R>L, no wheezes, rales or rhonchi, normal respiratory effort. On room air Cardiovascular system: normal S1/S2, RRR, no edema, 2+ radial pulses   Gastrointestinal system: soft, NT, ND, +bowel sounds. Central nervous system: no gross focal neurologic deficits, normal speech, A&O Skin: dry, intact, cool to touch per RN Psychiatry: normal mood, congruent affect, judgement and insight appear normal   Data Reviewed:  As reviewed in detail above.  Assessment and Plan:  * Right lower lobe pneumonia Generalized weakness Anorexia Chest x-ray showing RLL PNA with small right parapneumonic effusion.  Of note, pt's lung cancer involves the RLL. With leukopenia.  No other SIRS criteria present on admission.  Clinically not septic at this time.  Suspect weakness and poor appetite related to infection in addition to underlying malignancy. Treated with Rocephin  and Doxycycline  in the ED.  --Continue IV Rocephin  and Doxycycline  --IV fluids --Check sputum culture, antigens for S pneumo and legionella --Check MRSA screen --Monitor fever  curve, CBC --Symptomatic care PRN per orders - bronchodilators, mucolytics  --Consider CT chest for better evaluation given RLL malignancy --PT/OT evaluations --Fall precautions  Malignant neoplasm of lower lobe of right lung (HCC) With mets to brain s/p craniotomy and resection of left parietal lesion, with new 3mm left front lobe lesion on MRI 12/23/24, also palliative radiation. Follows with Dr. Sherrod and Buckley.   Last chemo on 01/12/24. --Consider Palliative Care consult   Essential hypertension BP soft 102/73 in the ED. Hold home losartan  for now, resume when BP warrants.  GERD (gastroesophageal reflux disease) Resume PPI  Hyperlipidemia Resume statin      Advance Care Planning: Code status - full code  Consults: None  Family Communication: Alyse Miller at bedside during virtual admission encounter  Severity of  Illness: The appropriate patient status for this patient is OBSERVATION. Observation status is judged to be reasonable and necessary in order to provide the required intensity of service to ensure the patient's safety. The patient's presenting symptoms, physical exam findings, and initial radiographic and laboratory data in the context of their medical condition is felt to place them at decreased risk for further clinical deterioration. Furthermore, it is anticipated that the patient will be medically stable for discharge from the hospital within 2 midnights of admission.   Author: Burnard DELENA Cunning, DO 01/25/2024 3:54 PM  For on call review [email or web address].      [1]  Allergies Allergen Reactions   Lotensin  [Benazepril  Hcl] Cough   "

## 2024-01-25 NOTE — Assessment & Plan Note (Addendum)
 Generalized weakness Anorexia Chest x-ray showing RLL PNA with small right parapneumonic effusion.  Of note, pt's lung cancer involves the RLL. With leukopenia.  No other SIRS criteria present on admission.  Clinically not septic at this time.  Suspect weakness and poor appetite related to infection in addition to underlying malignancy. Treated with Rocephin  and Doxycycline  in the ED.  --Continue IV Rocephin  and Doxycycline  --IV fluids --Check sputum culture, antigens for S pneumo and legionella --Check MRSA screen --Monitor fever curve, CBC --Symptomatic care PRN per orders - bronchodilators, mucolytics  --Consider CT chest for better evaluation given RLL malignancy --PT/OT evaluations --Fall precautions

## 2024-01-25 NOTE — Assessment & Plan Note (Signed)
 BP soft 102/73 in the ED. Hold home losartan  for now, resume when BP warrants.

## 2024-01-25 NOTE — ED Provider Notes (Signed)
 " [place] EMERGENCY DEPARTMENT AT [facility] Provider Note   CSN: [id number] Arrival date & time: 01/25/24  1054     Patient presents with: Weakness   Noah King is a 79 y.o. male with past medical history of lung cancer with brain metastases, hyperlipidemia, hypertension, who presents emergency department for evaluation of weakness.  Patient was brought in by EMS, due to a friend who is concerned that he has had a decreased appetite and generalized weakness over the last week or 2.  Patient appears to be somewhat confused and a poor historian as to what brought him here.  EMS reports that patient has been laying on his couch for the last week with little movement and he did not want to come to the hospital.  Patient is currently denying any headache, chest pain, shortness of breath, abdominal pain, nausea or vomiting.  He does endorse some occasional dysuria.  Patient is currently on his second round of active chemotherapy, most recent session on 01/12/2024.    Fall       Prior to Admission medications  Medication Sig Start Date End Date Taking? Authorizing Provider  atorvastatin  (LIPITOR) 20 MG tablet Take 1 tablet (20 mg total) by mouth daily. 12/18/23   Nafziger, Darleene, NP  docusate sodium  (COLACE) 100 MG capsule Take 100 mg by mouth daily as needed (constipation).    [provider]  folic acid  (FOLVITE ) 1 MG tablet Take 1 tablet (1 mg total) by mouth daily. 12/24/23   Heilingoetter, Cassandra L, PA-C  losartan  (COZAAR ) 25 MG tablet Take 1 tablet (25 mg total) by mouth daily. 12/18/23   Nafziger, Darleene, NP  Multiple Vitamins-Minerals (MENS 50+ MULTIVITAMIN) TABS Take 1 tablet by mouth daily.    [provider]  pantoprazole  (PROTONIX ) 40 MG tablet Take 1 tablet (40 mg total) by mouth daily. 12/18/23   Nafziger, Darleene, NP  prochlorperazine  (COMPAZINE ) 10 MG tablet Take 1 tablet (10 mg total) by mouth every 6 (six) hours as needed. 12/24/23    Heilingoetter, Cassandra L, PA-C    Allergies: Lotensin  [benazepril  hcl]    Review of Systems  Neurological:  Positive for weakness.    Updated Vital Signs BP 102/73 (BP Location: Right Arm)   Pulse 89   Temp 98.8 F (37.1 C) (Oral)   Resp 15   SpO2 92%   Physical Exam Vitals and nursing note reviewed.  Constitutional:      Appearance: Normal appearance.  HENT:     Head: Normocephalic and atraumatic.     Mouth/Throat:     Mouth: Mucous membranes are moist.  Eyes:     General: No scleral icterus.       Right eye: No discharge.        Left eye: No discharge.     Conjunctiva/sclera: Conjunctivae normal.  Cardiovascular:     Rate and Rhythm: Normal rate and regular rhythm.     Pulses: Normal pulses.  Pulmonary:     Effort: Pulmonary effort is normal.     Breath sounds: Normal breath sounds.  Abdominal:     General: There is no distension.     Tenderness: There is no abdominal tenderness.  Musculoskeletal:        General: No deformity.     Cervical back: Normal range of motion.  Skin:    General: Skin is warm and dry.     Capillary Refill: Capillary refill takes less than 2 seconds.  Neurological:  Mental Status: He is alert.     Motor: No weakness.  Psychiatric:        Mood and Affect: Mood normal.     (all labs ordered are listed, but only abnormal results are displayed) Labs Reviewed  CBC WITH DIFFERENTIAL/PLATELET - Abnormal; Notable for the following components:      Result Value   WBC 1.9 (*)    RBC 3.44 (*)    Hemoglobin 10.7 (*)    HCT 30.7 (*)    Platelets 105 (*)    Neutro Abs 1.2 (*)    Lymphs Abs 0.3 (*)    All other components within normal limits  BASIC METABOLIC PANEL WITH GFR - Abnormal; Notable for the following components:   Sodium 132 (*)    Chloride 94 (*)    All other components within normal limits  URINALYSIS, ROUTINE W REFLEX MICROSCOPIC  TROPONIN T, HIGH SENSITIVITY  TROPONIN T, HIGH SENSITIVITY     EKG: None  Radiology: CT Head Wo Contrast Result Date: 01/25/2024 EXAM: CT HEAD WITHOUT CONTRAST 01/25/2024 11:37:33 AM TECHNIQUE: CT of the head was performed without the administration of intravenous contrast. Automated exposure control, iterative reconstruction, and/or weight based adjustment of the mA/kV was utilized to reduce the radiation dose to as low as reasonably achievable. COMPARISON: MRI head 12/24/2023. CLINICAL HISTORY: weakness, confusion FINDINGS: BRAIN AND VENTRICLES: There is no evidence of an acute infarct, acute intracranial hemorrhage, midline shift, hydrocephalus, or extra-axial fluid collection. There is mild cerebral atrophy. A subcentimeter hyperdense focus in the medial left frontal lobe corresponds to susceptibility/chronic blood products on multiple prior MRI examinations. Scattered hypodensities in the cerebral white matter bilaterally are nonspecific but compatible with mild chronic small vessel ischemic disease. Calcified atherosclerosis at the skull base. ORBITS: Left cataract extraction. SINUSES: Persistent near complete opacification of the left frontal sinus. Clear mastoid air cells. SOFT TISSUES AND SKULL: Left parietal craniotomy. No acute soft tissue abnormality. No skull fracture. IMPRESSION: 1. No acute intracranial abnormality. 2. Mild chronic small vessel ischemic disease. Electronically signed by: Dasie Hamburg MD MD 01/25/2024 12:07 PM EST RP Workstation: [id number]   DG Chest 2 View Result Date: 01/25/2024 CLINICAL DATA:  Weakness, decreased appetite, dysuria. EXAM: CHEST - 2 VIEW COMPARISON:  08/11/2023 and CT chest 12/09/2023. FINDINGS: Trachea is midline. Heart size normal. New dense airspace consolidation in the right lower lobe with probable slight involvement of the right perihilar region. Tiny right pleural effusion. Biapical pleural thickening. Left lung is clear. IMPRESSION: 1. Right lower lobe pneumonia with a small right parapneumonic effusion.  Followup PA and lateral chest X-ray is recommended in 3-4 weeks following trial of antibiotic therapy to ensure resolution and exclude underlying malignancy. 2. Probable mild involvement of the right perihilar region. Electronically Signed   By: Newell Eke M.D.   On: 01/25/2024 11:50     Procedures   Medications Ordered in the ED  cefTRIAXone  (ROCEPHIN ) 2 g in sodium chloride  0.9 % 100 mL IVPB (has no administration in time range)  doxycycline  (VIBRAMYCIN ) 100 mg in sodium chloride  0.9 % 250 mL IVPB (has no administration in time range)  lactated ringers  bolus 1,000 mL (1,000 mLs Intravenous New Bag/Given 01/25/24 1202)                                   Medical Decision Making Amount and/or Complexity of Data Reviewed Labs: ordered. Radiology: ordered.  Risk Decision regarding  hospitalization.   This patient presents to the ED for concern of weakness, this involves an extensive number of treatment options, and is a complaint that carries with it a high risk of complications and morbidity.   Differential diagnosis includes: Stroke, TIA, UTI, pyelonephritis, sepsis, medication side effect, active malignancy, dehydration, electrolyte abnormality  Co morbidities:  lung cancer, hypertension  Social Determinants of Health:   lives alone  Additional history:  Patient's most recent chemotherapy session was on 12/29.  Lab Tests:  I Ordered, and personally interpreted labs.  The pertinent results include:    - No acute abnormalities that would explain patient's symptoms, however sodium: 132, leukopenia: 1.9.  Imaging Studies:  I ordered imaging studies including CT head, chest x-ray I independently visualized and interpreted imaging which showed CT head without abnormality, chest x-ray with right lower lobe pneumonia I agree with the radiologist interpretation  Cardiac Monitoring/ECG:  The patient was maintained on a cardiac monitor.  I personally viewed and interpreted  the cardiac monitored which showed an underlying rhythm of: Sinus rhythm  Medicines ordered and prescription drug management:  I ordered medication including  Medications  cefTRIAXone  (ROCEPHIN ) 2 g in sodium chloride  0.9 % 100 mL IVPB (has no administration in time range)  doxycycline  (VIBRAMYCIN ) 100 mg in sodium chloride  0.9 % 250 mL IVPB (has no administration in time range)  lactated ringers  bolus 1,000 mL (1,000 mLs Intravenous New Bag/Given 01/25/24 1202)   for dehydration and pneumonia Reevaluation of the patient after these medicines showed that the patient improved I have reviewed the patients home medicines and have made adjustments as needed  Test Considered:   none  Critical Interventions:   none  Consultations Obtained: Hospitalist  Problem List / ED Course:     ICD-10-CM   1. Pneumonia of right lower lobe due to infectious organism  J18.9       MDM: 79 year old male who presents emergency department for evaluation of weakness.  Basic labs, EKG, x-ray, CT head obtained.  Chest x-ray shows evidence of right lower lobe pneumonia, which is likely contributing to patient's weakness and decreased p.o. intake.  His UA is still pending, so ruling out UTI.  However, given patient's borderline hypotension, decrease in baseline activity over the last 1 week, I believe he would benefit from hospital admission for IV antibiotics.  Consult to medicine has been placed.  Dr. Fausto from [facility] hospitalist will be accepting.    Dispostion:  After consideration of the diagnostic results and the patients response to treatment, I feel that the patient would benefit from hospital admission and IV antibiotics.   Final diagnoses:  Pneumonia of right lower lobe due to infectious organism    ED Discharge Orders     None          Torrence Marry RAMAN, PA-C 01/25/24 1316  "

## 2024-01-25 NOTE — Assessment & Plan Note (Signed)
-   Resume PPI

## 2024-01-25 NOTE — ED Notes (Signed)
 Patient taken to radiology at this time ?

## 2024-01-25 NOTE — Plan of Care (Signed)
  Problem: Education: Goal: Knowledge of General Education information will improve Description: Including pain rating scale, medication(s)/side effects and non-pharmacologic comfort measures Outcome: Progressing   Problem: Nutrition: Goal: Adequate nutrition will be maintained Outcome: Progressing   Problem: Pain Managment: Goal: General experience of comfort will improve and/or be controlled Outcome: Progressing   Problem: Safety: Goal: Ability to remain free from injury will improve Outcome: Progressing   Problem: Skin Integrity: Goal: Risk for impaired skin integrity will decrease Outcome: Progressing

## 2024-01-25 NOTE — ED Triage Notes (Signed)
 Patient BIB EMS for evaluation of generalized weakness.  CA patient with most recent chemo on 12/29.  Friend called EMS with concerns due to increasing weakness, decreased appetite, and dysuria.  Pt with no complaints.

## 2024-01-25 NOTE — ED Triage Notes (Deleted)
 Patient reports mechanical fall yesterday in aprking lot hitting face, has small cut and slight swelling to upper lip no active bleeding, denies LOC/thinners/midline tenderness. PERRLA. Patient is alert and oriented x 4. Airway patent, respirations even and unlabored. Skin normal, warm and dry. Patient c/o left rib and left wrist. No deformities noted.

## 2024-01-26 ENCOUNTER — Inpatient Hospital Stay (HOSPITAL_COMMUNITY)

## 2024-01-26 DIAGNOSIS — R627 Adult failure to thrive: Secondary | ICD-10-CM | POA: Diagnosis present

## 2024-01-26 DIAGNOSIS — G9389 Other specified disorders of brain: Secondary | ICD-10-CM | POA: Diagnosis not present

## 2024-01-26 DIAGNOSIS — C3431 Malignant neoplasm of lower lobe, right bronchus or lung: Secondary | ICD-10-CM | POA: Diagnosis present

## 2024-01-26 DIAGNOSIS — J918 Pleural effusion in other conditions classified elsewhere: Secondary | ICD-10-CM | POA: Diagnosis present

## 2024-01-26 DIAGNOSIS — D84821 Immunodeficiency due to drugs: Secondary | ICD-10-CM | POA: Diagnosis present

## 2024-01-26 DIAGNOSIS — D61810 Antineoplastic chemotherapy induced pancytopenia: Secondary | ICD-10-CM | POA: Diagnosis present

## 2024-01-26 DIAGNOSIS — K219 Gastro-esophageal reflux disease without esophagitis: Secondary | ICD-10-CM | POA: Diagnosis present

## 2024-01-26 DIAGNOSIS — Z888 Allergy status to other drugs, medicaments and biological substances status: Secondary | ICD-10-CM | POA: Diagnosis not present

## 2024-01-26 DIAGNOSIS — J188 Other pneumonia, unspecified organism: Secondary | ICD-10-CM | POA: Diagnosis present

## 2024-01-26 DIAGNOSIS — Z85118 Personal history of other malignant neoplasm of bronchus and lung: Secondary | ICD-10-CM | POA: Diagnosis not present

## 2024-01-26 DIAGNOSIS — R4701 Aphasia: Secondary | ICD-10-CM | POA: Diagnosis present

## 2024-01-26 DIAGNOSIS — J189 Pneumonia, unspecified organism: Secondary | ICD-10-CM | POA: Diagnosis present

## 2024-01-26 DIAGNOSIS — Z515 Encounter for palliative care: Secondary | ICD-10-CM | POA: Diagnosis not present

## 2024-01-26 DIAGNOSIS — E785 Hyperlipidemia, unspecified: Secondary | ICD-10-CM

## 2024-01-26 DIAGNOSIS — Z79899 Other long term (current) drug therapy: Secondary | ICD-10-CM | POA: Diagnosis not present

## 2024-01-26 DIAGNOSIS — Z923 Personal history of irradiation: Secondary | ICD-10-CM | POA: Diagnosis not present

## 2024-01-26 DIAGNOSIS — G9341 Metabolic encephalopathy: Secondary | ICD-10-CM | POA: Diagnosis present

## 2024-01-26 DIAGNOSIS — E876 Hypokalemia: Secondary | ICD-10-CM | POA: Diagnosis present

## 2024-01-26 DIAGNOSIS — R630 Anorexia: Secondary | ICD-10-CM | POA: Diagnosis not present

## 2024-01-26 DIAGNOSIS — I10 Essential (primary) hypertension: Secondary | ICD-10-CM | POA: Diagnosis present

## 2024-01-26 DIAGNOSIS — C3432 Malignant neoplasm of lower lobe, left bronchus or lung: Secondary | ICD-10-CM | POA: Diagnosis not present

## 2024-01-26 DIAGNOSIS — D709 Neutropenia, unspecified: Secondary | ICD-10-CM | POA: Diagnosis not present

## 2024-01-26 DIAGNOSIS — Z87891 Personal history of nicotine dependence: Secondary | ICD-10-CM | POA: Diagnosis not present

## 2024-01-26 DIAGNOSIS — Z66 Do not resuscitate: Secondary | ICD-10-CM | POA: Diagnosis present

## 2024-01-26 DIAGNOSIS — E43 Unspecified severe protein-calorie malnutrition: Secondary | ICD-10-CM | POA: Diagnosis present

## 2024-01-26 DIAGNOSIS — F32A Depression, unspecified: Secondary | ICD-10-CM | POA: Diagnosis present

## 2024-01-26 DIAGNOSIS — G9340 Encephalopathy, unspecified: Secondary | ICD-10-CM | POA: Diagnosis not present

## 2024-01-26 DIAGNOSIS — J850 Gangrene and necrosis of lung: Secondary | ICD-10-CM | POA: Diagnosis present

## 2024-01-26 DIAGNOSIS — R531 Weakness: Secondary | ICD-10-CM | POA: Diagnosis not present

## 2024-01-26 DIAGNOSIS — C7931 Secondary malignant neoplasm of brain: Secondary | ICD-10-CM | POA: Diagnosis present

## 2024-01-26 DIAGNOSIS — Z602 Problems related to living alone: Secondary | ICD-10-CM | POA: Diagnosis present

## 2024-01-26 DIAGNOSIS — J90 Pleural effusion, not elsewhere classified: Secondary | ICD-10-CM | POA: Diagnosis not present

## 2024-01-26 LAB — CBC
HCT: 25.6 % — ABNORMAL LOW (ref 39.0–52.0)
Hemoglobin: 8.9 g/dL — ABNORMAL LOW (ref 13.0–17.0)
MCH: 30.8 pg (ref 26.0–34.0)
MCHC: 34.8 g/dL (ref 30.0–36.0)
MCV: 88.6 fL (ref 80.0–100.0)
Platelets: 96 K/uL — ABNORMAL LOW (ref 150–400)
RBC: 2.89 MIL/uL — ABNORMAL LOW (ref 4.22–5.81)
RDW: 12 % (ref 11.5–15.5)
WBC: 2.2 K/uL — ABNORMAL LOW (ref 4.0–10.5)
nRBC: 0 % (ref 0.0–0.2)

## 2024-01-26 LAB — BASIC METABOLIC PANEL WITH GFR
Anion gap: 10 (ref 5–15)
BUN: 14 mg/dL (ref 8–23)
CO2: 26 mmol/L (ref 22–32)
Calcium: 8.3 mg/dL — ABNORMAL LOW (ref 8.9–10.3)
Chloride: 97 mmol/L — ABNORMAL LOW (ref 98–111)
Creatinine, Ser: 0.76 mg/dL (ref 0.61–1.24)
GFR, Estimated: >60 mL/min
Glucose, Bld: 88 mg/dL (ref 70–99)
Potassium: 4.1 mmol/L (ref 3.5–5.1)
Sodium: 132 mmol/L — ABNORMAL LOW (ref 135–145)

## 2024-01-26 LAB — PHOSPHORUS: Phosphorus: 3.3 mg/dL (ref 2.5–4.6)

## 2024-01-26 LAB — MAGNESIUM: Magnesium: 1.5 mg/dL — ABNORMAL LOW (ref 1.7–2.4)

## 2024-01-26 MED ORDER — MAGNESIUM OXIDE -MG SUPPLEMENT 400 (240 MG) MG PO TABS
400.0000 mg | ORAL_TABLET | Freq: Every day | ORAL | Status: DC
  Administered 2024-01-26 – 2024-02-01 (×7): 400 mg via ORAL
  Filled 2024-01-26 (×7): qty 1

## 2024-01-26 MED ORDER — IOHEXOL 300 MG/ML  SOLN
75.0000 mL | Freq: Once | INTRAMUSCULAR | Status: AC | PRN
  Administered 2024-01-26: 75 mL via INTRAVENOUS

## 2024-01-26 NOTE — Evaluation (Signed)
 Physical Therapy Evaluation Patient Details Name: Noah King MRN: [id number] DOB: May 27, 1945 Today's Date: 01/26/2024  History of Present Illness  79 yo male presents to therapy following hospital admission on 01/25/2024 due to poor PO intake, mild confusion and generalized weakness. Pt found to have PNA. Pt PMH includes but is not limited to lung ca with mets s/p resection and palliative radiation and on chemotherapy, HTN, HLD, HTN, hyperlipidemia and GERD.  Clinical Impression      Pt admitted with above diagnosis.  Pt currently with functional limitations due to the deficits listed below (see PT Problem List). Pt in bed when PT arrived. Pt agreeable to therapy intervention. Pt requires cues increased time for all motor processing and planning, cues for attention to task and noted difficulty word finding and expressing self. Pt is oriented to self only at time of eval and is a poor historian and unable to provide insight to PLOF. Pt required min A for supine to sit, min A x 2 for sit to stand  from EOB, min A x 2 for safety with gait tasks and close chair follow with RW and extensive cues with steppage like gait pattern due to absent B ankle DF for 10 feet in personal room. Pt left seated in recliner, all needs in place and nursing staff aware of recommendation for +2 assist with transfer tasks. Patient will benefit from continued inpatient follow up therapy, <3 hours/day. Pt will benefit from acute skilled PT to increase their independence and safety with mobility to allow discharge.       If plan is discharge home, recommend the following: Two people to help with walking and/or transfers;A lot of help with bathing/dressing/bathroom;Assistance with cooking/housework;Assist for transportation;Help with stairs or ramp for entrance;Supervision due to cognitive status;Direct supervision/assist for financial management;Direct supervision/assist for medications management   Can travel by private  vehicle   Yes    Equipment Recommendations None recommended by PT  Recommendations for Other Services       Functional Status Assessment Patient has had a recent decline in their functional status and demonstrates the ability to make significant improvements in function in a reasonable and predictable amount of time.     Precautions / Restrictions Precautions Precautions: Fall Restrictions Weight Bearing Restrictions Per Provider Order: No      Mobility  Bed Mobility Overal bed mobility: Needs Assistance Bed Mobility: Supine to Sit     Supine to sit: Min assist, HOB elevated     General bed mobility comments: increased time, cues for attention to task, once EOB cues to maintain B LE on floor with slight posterior lean noted    Transfers Overall transfer level: Needs assistance Equipment used: Rolling walker (2 wheels) Transfers: Sit to/from Stand Sit to Stand: Min assist, +2 physical assistance, +2 safety/equipment, From elevated surface           General transfer comment: cues, increased time for all motor processing and planning, slight posterior lean with initial standing with pt supporting on bed with posterior distal B LE, pt required cues for UE placement and sequencing. nursing staff reported difficulty with SPC earlier in the day requiring 3    Ambulation/Gait Ambulation/Gait assistance: Min assist Gait Distance (Feet): 10 Feet Assistive device: Rolling walker (2 wheels) Gait Pattern/deviations: Step-to pattern, Decreased dorsiflexion - right, Decreased dorsiflexion - left, Steppage Gait velocity: decreased     General Gait Details: steppage like gait pattern with absent B DF, pt occationally sliding B LE on floor and  max cues and facilitation for RW management for proper posture, distance from RW and safety as well as attention to task  Stairs            Wheelchair Mobility     Tilt Bed    Modified Rankin (Stroke Patients Only)        Balance Overall balance assessment: History of Falls, Needs assistance Sitting-balance support: Feet supported Sitting balance-Leahy Scale: Poor     Standing balance support: Bilateral upper extremity supported, During functional activity, Reliant on assistive device for balance Standing balance-Leahy Scale: Poor                               Pertinent Vitals/Pain      Home Living Family/patient expects to be discharged to:: Private residence Living Arrangements: Alone (Friend has been staying with pt since end of Dec, 2025) Available Help at Discharge: Family;Friend(s);Available PRN/intermittently Type of Home: House Home Access: Stairs to enter Entrance Stairs-Rails: None Entrance Stairs-Number of Steps: 2   Home Layout: One level Home Equipment: Rollator (4 wheels);Cane - single point;Shower seat;Wheelchair - Nurse, [facility] (2 wheels) Additional Comments: majority of information per PLOF and home living obtained from prior admission    Prior Function Prior Level of Function : Needs assist;Patient poor historian/Family not available             Mobility Comments: reports mod I with household ambulation with rollator ADLs Comments: pt reports incr challenge with ADLs (bathing specifically), manages own meds, friend will be assisting with meals and groceries.  Daughter from [place]  will be staying with him for a few days.     Extremity/Trunk Assessment        Lower Extremity Assessment Lower Extremity Assessment: Generalized weakness;Difficult to assess due to impaired cognition (pt has absent B DF, significant mm atrophy, with gross mm weakness 4/5 L and 4-/5 R  and poor motor processing and planning)    Cervical / Trunk Assessment Cervical / Trunk Assessment: Normal  Communication   Communication Communication: Impaired Factors Affecting Communication: Difficulty expressing self    Cognition Arousal: Alert Behavior During  Therapy: Flat affect   PT - Cognitive impairments: History of cognitive impairments, Awareness, Attention, Sequencing, Safety/Judgement, No family/caregiver present to determine baseline                       PT - Cognition Comments: pt is oriented to self only Following commands: Impaired Following commands impaired: Only follows one step commands consistently, Follows one step commands inconsistently     Cueing Cueing Techniques: Verbal cues, Gestural cues, Tactile cues, Visual cues     General Comments      Exercises     Assessment/Plan    PT Assessment Patient needs continued PT services  PT Problem List Decreased strength;Decreased range of motion;Decreased activity tolerance;Decreased balance;Decreased mobility;Decreased coordination;Decreased cognition;Decreased knowledge of use of DME;Decreased safety awareness;Decreased knowledge of precautions;Decreased skin integrity       PT Treatment Interventions DME instruction;Gait training;Stair training;Functional mobility training;Therapeutic activities;Therapeutic exercise;Balance training;Neuromuscular re-education;Cognitive remediation;Patient/family education    PT Goals (Current goals can be found in the Care Plan section)  Acute Rehab PT Goals Patient Stated Goal: to go home PT Goal Formulation: With patient Time For Goal Achievement: 02/09/24 Potential to Achieve Goals: Fair    Frequency Min 2X/week     Co-evaluation PT/OT/SLP Co-Evaluation/Treatment: Yes Reason for Co-Treatment: Complexity of the patient's impairments (multi-system involvement);Necessary  to address cognition/behavior during functional activity;For patient/therapist safety;To address functional/ADL transfers PT goals addressed during session: Mobility/safety with mobility;Balance;Proper use of DME;Strengthening/ROM OT goals addressed during session: ADL's and self-care;Proper use of Adaptive equipment and DME;Strengthening/ROM        AM-PAC PT 6 Clicks Mobility  Outcome Measure Help needed turning from your back to your side while in a flat bed without using bedrails?: A Little Help needed moving from lying on your back to sitting on the side of a flat bed without using bedrails?: A Little Help needed moving to and from a bed to a chair (including a wheelchair)?: A Little Help needed standing up from a chair using your arms (e.g., wheelchair or bedside chair)?: A Little Help needed to walk in hospital room?: A Little Help needed climbing 3-5 steps with a railing? : Total 6 Click Score: 16    End of Session Equipment Utilized During Treatment: Gait belt Activity Tolerance: Patient limited by fatigue Patient left: in chair;with call bell/phone within reach;with chair alarm set Nurse Communication: Mobility status PT Visit Diagnosis: Unsteadiness on feet (R26.81);Other abnormalities of gait and mobility (R26.89);Muscle weakness (generalized) (M62.81);History of falling (Z91.81);Difficulty in walking, not elsewhere classified (R26.2)    Time: 8945-8883 PT Time Calculation (min) (ACUTE ONLY): 22 min   Charges:   PT Evaluation $PT Eval Low Complexity: 1 Low   PT General Charges $$ ACUTE PT VISIT: 1 Visit        [place], PT Acute Rehab   [place] VEAR Drone 01/26/2024, 12:25 PM

## 2024-01-26 NOTE — Evaluation (Signed)
 Occupational Therapy Evaluation Patient Details Name: Noah King MRN: [id number] DOB: Feb 21, 1945 Today's Date: 01/26/2024   History of Present Illness   79 yo male presents to therapy following hospital admission on 01/25/2024 due to poor PO intake, mild confusion and generalized weakness. Pt found to have PNA. Pt PMH includes but is not limited to lung ca with brain mets s/p resection and palliative radiation and on chemotherapy, HTN, HLD, HTN, hyperlipidemia and GERD.     Clinical Impressions Pt admitted with above. Pt poor historian and unable to provide insight into PLOF and home setup as he is alert only to self. Pt noted with slow processing, poor insight, impaired attention, judgement and safety awareness which impacts safe, efficient ADL + mobility performance. Requires increased time and step-by-step multimodal cues for initiation of all motor patterns. MIN A for bed mobility, MIN A +2 for STS using RW and ~10 ft in room with close chair follow for safety. Anticipate pt will require MAX A for LB ADLs and +2 for safe transfers to/from [facility] (RN in room and aware). Pt would benefit from skilled OT services to address noted impairments and functional limitations (see below for any additional details) in order to maximize safety and independence while minimizing falls risk and caregiver burden. Anticipate the need for follow up OT services upon acute hospital DC. Patient will benefit from continued inpatient follow up therapy, <3 hours/day      If plan is discharge home, recommend the following:   Two people to help with walking and/or transfers;Two people to help with bathing/dressing/bathroom;Direct supervision/assist for medications management;Direct supervision/assist for financial management;Assist for transportation;Supervision due to cognitive status     Functional Status Assessment   Patient has had a recent decline in their functional status and demonstrates the ability to  make significant improvements in function in a reasonable and predictable amount of time.     Equipment Recommendations   None recommended by OT      Precautions/Restrictions   Precautions Precautions: Fall Recall of Precautions/Restrictions: Impaired Restrictions Weight Bearing Restrictions Per Provider Order: No     Mobility Bed Mobility Overal bed mobility: Needs Assistance Bed Mobility: Supine to Sit     Supine to sit: Min assist, HOB elevated     General bed mobility comments: increased time, cues for attention to task, once EOB cues to maintain B LE on floor with slight posterior lean noted    Transfers Overall transfer level: Needs assistance Equipment used: Rolling walker (2 wheels) Transfers: Sit to/from Stand Sit to Stand: Min assist, +2 physical assistance, +2 safety/equipment, From elevated surface           General transfer comment: cues, increased time for all motor processing and planning, slight posterior lean with initial standing with pt supporting on bed with posterior distal B LE, pt required cues for UE placement and sequencing. nursing staff reported difficulty with SPC earlier in the day requiring 3      Balance Overall balance assessment: History of Falls, Needs assistance Sitting-balance support: Feet supported Sitting balance-Leahy Scale: Poor     Standing balance support: Bilateral upper extremity supported, During functional activity, Reliant on assistive device for balance Standing balance-Leahy Scale: Poor                             ADL either performed or assessed with clinical judgement   ADL Overall ADL's : Needs assistance/impaired Eating/Feeding: Sitting;Set up   Grooming:  Sitting;Minimal assistance;Brushing hair;Cueing for sequencing;Cueing for safety Grooming Details (indicate cue type and reason): recliner level Upper Body Bathing: Sitting;Minimal assistance   Lower Body Bathing: Sit to/from  stand;Maximal assistance;+2 for physical assistance   Upper Body Dressing : Sitting;Minimal assistance   Lower Body Dressing: Sit to/from stand;Maximal assistance;+2 for physical assistance   Toilet Transfer: +2 for physical assistance;BSC/3in1;Rolling walker (2 wheels);Minimal assistance Toilet Transfer Details (indicate cue type and reason): pt required +2 for safe transfers, difficulties advancing BLE (R > L) using RW for stepping pattern. slight posterior lean Toileting- Clothing Manipulation and Hygiene: Total assistance;Maximal assistance;+2 for physical assistance;Sit to/from stand       Functional mobility during ADLs: Minimal assistance;Cueing for safety;Cueing for sequencing;Rolling walker (2 wheels);+2 for physical assistance General ADL Comments: RN in room reporting pt +3 assist for transfer earlier to [facility]. Pt with slow processing, difficulties sustaining attn to task, poor insight.     Vision Baseline Vision/History: 1 Wears glasses Ability to See in Adequate Light: 0 Adequate Patient Visual Report: No change from baseline       Perception         Praxis         Pertinent Vitals/Pain Pain Assessment Pain Assessment: No/denies pain     Extremity/Trunk Assessment Upper Extremity Assessment Upper Extremity Assessment: Right hand dominant;Difficult to assess due to impaired cognition   Lower Extremity Assessment Lower Extremity Assessment: Defer to PT evaluation   Cervical / Trunk Assessment Cervical / Trunk Assessment: Normal   Communication Communication Communication: Impaired Factors Affecting Communication: Difficulty expressing self   Cognition Arousal: Alert Behavior During Therapy: Flat affect Cognition: No family/caregiver present to determine baseline, Cognition impaired   Orientation impairments: Place, Time, Situation Awareness: Intellectual awareness impaired, Online awareness impaired Memory impairment (select all impairments): Short-term  memory, Working memory, Non-declarative long-term memory Attention impairment (select first level of impairment): Focused attention Executive functioning impairment (select all impairments): Problem solving, Reasoning, Sequencing, Organization, Initiation OT - Cognition Comments: slow processing, decreased insight into situation/deficits, difficulties expressing self and word finding. quickly internally and externally distracted. poor attn to task throughout.                 Following commands: Impaired Following commands impaired: Only follows one step commands consistently, Follows one step commands inconsistently     Cueing  General Comments   Cueing Techniques: Verbal cues;Gestural cues;Tactile cues;Visual cues              Home Living Family/patient expects to be discharged to:: Private residence Living Arrangements: Alone (Friend has been staying with pt since end of Dec, 2025) Available Help at Discharge: Family;Friend(s);Available PRN/intermittently Type of Home: House Home Access: Stairs to enter [employer] of Steps: 2 Entrance Stairs-Rails: None Home Layout: One level     Bathroom Shower/Tub: Walk-in shower;Sponge bathes at baseline   [employer]: Standard Bathroom Accessibility: No   Home Equipment: Rollator (4 wheels);Cane - single point;Shower seat;Wheelchair - Nurse, [facility] (2 wheels)   Additional Comments: majority of information per PLOF and home living obtained from prior admission      Prior Functioning/Environment Prior Level of Function : Needs assist;Patient poor historian/Family not available             Mobility Comments: reports mod I with household ambulation with rollator ADLs Comments: pt reports incr challenge with ADLs (bathing specifically), manages own meds, friend will be assisting with meals and groceries.  Daughter from [place]  will be staying with him for a few days.  OT Problem List:  Decreased strength;Decreased range of motion;Decreased activity tolerance;Impaired balance (sitting and/or standing);Decreased coordination;Decreased cognition;Decreased safety awareness;Decreased knowledge of use of DME or AE;Decreased knowledge of precautions   OT Treatment/Interventions: Self-care/ADL training;Neuromuscular education;Energy conservation;DME and/or AE instruction;Therapeutic activities;Patient/family education;Balance training      OT Goals(Current goals can be found in the care plan section)   Acute Rehab OT Goals OT Goal Formulation: Patient unable to participate in goal setting Time For Goal Achievement: 02/09/24 Potential to Achieve Goals: Fair   OT Frequency:  Min 2X/week    Co-evaluation   Reason for Co-Treatment: Complexity of the patient's impairments (multi-system involvement);Necessary to address cognition/behavior during functional activity;For patient/therapist safety;To address functional/ADL transfers PT goals addressed during session: Mobility/safety with mobility;Balance;Proper use of DME;Strengthening/ROM OT goals addressed during session: ADL's and self-care;Proper use of Adaptive equipment and DME;Strengthening/ROM      AM-PAC OT 6 Clicks Daily Activity     Outcome Measure Help from another person eating meals?: A Little Help from another person taking care of personal grooming?: A Little Help from another person toileting, which includes using toliet, bedpan, or urinal?: Total Help from another person bathing (including washing, rinsing, drying)?: Total Help from another person to put on and taking off regular upper body clothing?: Total Help from another person to put on and taking off regular lower body clothing?: Total 6 Click Score: 10   End of Session Equipment Utilized During Treatment: Gait belt;Rolling walker (2 wheels) Nurse Communication: Mobility status  Activity Tolerance: Patient tolerated treatment well Patient left: in  chair;with call bell/phone within reach;with chair alarm set;with nursing/sitter in room  OT Visit Diagnosis: Unsteadiness on feet (R26.81);Other abnormalities of gait and mobility (R26.89);Muscle weakness (generalized) (M62.81);History of falling (Z91.81);Other symptoms and signs involving cognitive function;Cognitive communication deficit (R41.841) Symptoms and signs involving cognitive functions: Other cerebrovascular disease                Time: 8945-8883 OT Time Calculation (min): 22 min Charges:  OT General Charges $OT Visit: 1 Visit OT Evaluation $OT Eval Low Complexity: 1 Low  Jimmey Hengel L. Nikolas Casher, OTR/L  01/26/2024, 3:06 PM

## 2024-01-26 NOTE — NC FL2 (Signed)
 " [place]  MEDICAID FL2 LEVEL OF CARE FORM     IDENTIFICATION  Patient Name: Noah King Birthdate: 1945-05-06 Sex: male Admission Date (Current Location): 01/25/2024  [facility] and [place] Number:  Producer, Television/film/video and Address:  [facility],  [address] [place], [place] [address]      Provider Number: [id number]  Attending Physician Name and Address:  Darci Pore, MD  Relative Name and Phone Number:  Jahrell Hamor (daughter) Ph: [phone]    Current Level of Care: Hospital Recommended Level of Care: Skilled Nursing Facility Prior Approval Number:    Date Approved/Denied:   PASRR Number: [id number]  Discharge Plan: SNF    Current Diagnoses: Patient Active Problem List   Diagnosis Date Noted   Right lower lobe pneumonia 01/25/2024   GERD (gastroesophageal reflux disease) 01/25/2024   Metastasis to brain (HCC) 12/29/2023   Goals of care, counseling/discussion 12/24/2023   Malignant neoplasm of lower lobe of right lung (HCC) 09/10/2023   S/P craniotomy 08/21/2023   Pulmonary nodules 08/21/2023   Mediastinal lymphadenopathy 08/13/2023   Herpes zoster 12/13/2009   Polycythemia vera (HCC) 04/16/2007   Contracture of palmar fascia 04/16/2007   Hypothyroidism 08/29/2006   Hyperlipidemia 08/29/2006   Essential hypertension 08/29/2006    Orientation RESPIRATION BLADDER Height & Weight     Self  Normal Incontinent Weight:   Height:     BEHAVIORAL SYMPTOMS/MOOD NEUROLOGICAL BOWEL NUTRITION STATUS      Incontinent Diet (Regular diet)  AMBULATORY STATUS COMMUNICATION OF NEEDS Skin   Limited Assist Verbally Normal                       Personal Care Assistance Level of Assistance  Bathing, Feeding, Dressing Bathing Assistance: Limited assistance Feeding assistance: Limited assistance Dressing Assistance: Limited assistance     Functional Limitations Info  Sight, Hearing, Speech Sight Info: Adequate Hearing Info:  Adequate Speech Info: Adequate    SPECIAL CARE FACTORS FREQUENCY  PT (By licensed PT), OT (By licensed OT)     PT Frequency: 5x's/week OT Frequency: 5x's/week            Contractures Contractures Info: Not present    Additional Factors Info  Code Status, Allergies Code Status Info: Full Allergies Info: Lotensin  (Benazepril  Hcl)           Current Medications (01/26/2024):  This is the current hospital active medication list Current Facility-Administered Medications  Medication Dose Route Frequency Provider Last Rate Last Admin   0.9 %  sodium chloride  infusion   Intravenous Continuous Fausto Burnard LABOR, DO 75 mL/hr at 01/26/24 0600 Infusion Verify at 01/26/24 0600   acetaminophen  (TYLENOL ) tablet 650 mg  650 mg Oral Q6H PRN Fausto Burnard A, DO       Or   acetaminophen  (TYLENOL ) suppository 650 mg  650 mg Rectal Q6H PRN Fausto Burnard A, DO       albuterol  (PROVENTIL ) (2.5 MG/3ML) 0.083% nebulizer solution 2.5 mg  2.5 mg Nebulization Q6H PRN Fausto Burnard A, DO       atorvastatin  (LIPITOR) tablet 20 mg  20 mg Oral Daily Fausto Burnard A, DO   20 mg at 01/26/24 0948   cefTRIAXone  (ROCEPHIN ) 2 g in sodium chloride  0.9 % 100 mL IVPB  2 g Intravenous Q24H Fausto Burnard A, DO 200 mL/hr at 01/26/24 1116 2 g at 01/26/24 1116   docusate sodium  (COLACE) capsule 100 mg  100 mg Oral Daily PRN Fausto Burnard LABOR, DO  doxycycline  (VIBRAMYCIN ) 100 mg in sodium chloride  0.9 % 250 mL IVPB  100 mg Intravenous Q12H Dufour, Gabrielle S, PA-C 125 mL/hr at 01/26/24 0214 100 mg at 01/26/24 0214   enoxaparin  (LOVENOX ) injection 40 mg  40 mg Subcutaneous Q24H Fausto Sor A, DO   40 mg at 01/25/24 2135   folic acid  (FOLVITE ) tablet 1 mg  1 mg Oral Daily Fausto Sor A, DO   1 mg at 01/26/24 9051   guaiFENesin  (MUCINEX ) 12 hr tablet 600 mg  600 mg Oral BID Fausto Sor A, DO   600 mg at 01/26/24 9050   magnesium  oxide (MAG-OX) tablet 400 mg  400 mg Oral Daily Sreeram, Narendranath,  MD   400 mg at 01/26/24 9051   pantoprazole  (PROTONIX ) EC tablet 40 mg  40 mg Oral Daily Fausto Sor A, DO   40 mg at 01/26/24 9051   prochlorperazine  (COMPAZINE ) tablet 10 mg  10 mg Oral Q6H PRN Fausto Sor A, DO       senna-docusate (Senokot-S) tablet 1 tablet  1 tablet Oral QHS PRN Fausto Sor LABOR, DO         Discharge Medications: Please see discharge summary for a list of discharge medications.  Relevant Imaging Results:  Relevant Lab Results:   Additional Information SSN: [id number]  Duwaine GORMAN Aran, LCSW     "

## 2024-01-26 NOTE — Progress Notes (Signed)
 " Progress Note   Patient: Noah King FMW:[id number] DOB: 1945-08-19 DOA: 01/25/2024     0 DOS: the patient was seen and examined on 01/26/2024   Brief hospital course: [place] Mar[place]o is a 79 y.o. male with medical history significant of lung cancer with brain mets s/p resection and palliative radiation, on chemotherapy, hypertension, hyperlipidemia presented to [facility] ED via EMS for evaluation of profound generalized weakness, poor appetite, mild confusion for the past 1-2 weeks.  Recent chemo treatment on 01/12/24.  Since that time, patient has become progressively more weak and has not been eating or drinking much of anything.  He has a cough that is apparently stable and chronic for over 20 years, has not seemed to worsen recently.  No fever, chills, rigors of sore throat.  No nausea vomiting. CT head without contrast no acute findings, mild chronic small vessel ischemic disease. Chest x-ray showed right lower lobe pneumonia with small right parapneumonic effusion. Patient is started on Rocephin , doxycycline  in the ED with IV hydration admitted to TRH service for further management evaluation.  Assessment and Plan: * Right lower lobe pneumonia Generalized weakness Anorexia Chest x-ray showing RLL PNA with small right parapneumonic effusion.  Patient does have non-small cell lung cancer involving the RLL. Continue IV Rocephin  and Doxycycline  in the ED.  Gentle IV fluids. Check sputum culture, antigens for S pneumo and legionella Check MRSA screen I will order CT chest for better evaluation given RLL malignancy He seems unsteady with technician noted.  PT/OT evaluation. Fall and aspiration precautions.  Malignant neoplasm of lower lobe of right lung (HCC) With mets to brain s/p craniotomy and resection of left parietal lesion, with new 3mm left front lobe lesion on MRI 12/23/24, currently on palliative radiation. Follows with Dr. Sherrod and Buckley.   Last chemo on 01/12/24.    He does have expressive aphasia, unsteady gait. Palliative Care consulted for goals of care discussion.  Essential hypertension BP soft 102/73 in the ED. Continue to hold home losartan  for now, resume when BP warrants.  GERD (gastroesophageal reflux disease) Resumed PPI  Hyperlipidemia Continue statin      Out of bed to chair. Incentive spirometry. Nursing supportive care. Fall, aspiration precautions. Diet:  Diet Orders (From admission, onward)     Start     Ordered   01/25/24 1356  Diet regular Room service appropriate? Yes; Fluid consistency: Thin  Diet effective now       Question Answer Comment  Room service appropriate? Yes   Fluid consistency: Thin      01/25/24 1356           DVT prophylaxis: enoxaparin  (LOVENOX ) injection 40 mg Start: 01/25/24 2200  Level of care: Med-Surg   Code Status: Full Code  Subjective: Patient is seen and examined today morning.  He is lying in bed.  Did get out of bed but is unsteady per RN.  Able to answer me slowly.  Denies cough.  Eating poor.  Physical Exam: Vitals:   01/25/24 2133 01/26/24 0555 01/26/24 0937 01/26/24 1310  BP: 120/73 115/61 (!) 119/55 102/63  Pulse: 73 82 60 63  Resp: 17 16 19    Temp: 100.2 F (37.9 C) 98.8 F (37.1 C) 98.2 F (36.8 C)   TempSrc:  Oral Oral   SpO2: 95% 93% 95% 90%    General - Elderly ill Caucasian male, no apparent distress HEENT - PERRLA, EOMI, atraumatic head, non tender sinuses. Lung - Clear, basal rales, rhonchi, no  wheezes. Heart - S1, S2 heard, no murmurs, rubs, trace pedal edema. Abdomen - Soft, non tender, bowel sounds good Neuro - Alert, awake and oriented x 1, slow mentation, non focal exam. Skin - Warm and dry.  Data Reviewed:      Latest Ref Rng & Units 01/26/2024    4:38 AM 01/25/2024   11:55 AM 01/12/2024    1:39 PM  CBC  WBC 4.0 - 10.5 K/uL 2.2  1.9  4.4   Hemoglobin 13.0 - 17.0 g/dL 8.9  89.2  87.9   Hematocrit 39.0 - 52.0 % 25.6  30.7  34.7    Platelets 150 - 400 K/uL 96  105  248       Latest Ref Rng & Units 01/26/2024    4:38 AM 01/25/2024   11:55 AM 01/12/2024    1:39 PM  BMP  Glucose 70 - 99 mg/dL 88  96  893   BUN 8 - 23 mg/dL 14  14  11    Creatinine 0.61 - 1.24 mg/dL 9.23  9.17  9.24   Sodium 135 - 145 mmol/L 132  132  137   Potassium 3.5 - 5.1 mmol/L 4.1  3.8  4.0   Chloride 98 - 111 mmol/L 97  94  100   CO2 22 - 32 mmol/L 26  27  26    Calcium  8.9 - 10.3 mg/dL 8.3  9.1  9.3    CT Head Wo Contrast Result Date: 01/25/2024 EXAM: CT HEAD WITHOUT CONTRAST 01/25/2024 11:37:33 AM TECHNIQUE: CT of the head was performed without the administration of intravenous contrast. Automated exposure control, iterative reconstruction, and/or weight based adjustment of the mA/kV was utilized to reduce the radiation dose to as low as reasonably achievable. COMPARISON: MRI head 12/24/2023. CLINICAL HISTORY: weakness, confusion FINDINGS: BRAIN AND VENTRICLES: There is no evidence of an acute infarct, acute intracranial hemorrhage, midline shift, hydrocephalus, or extra-axial fluid collection. There is mild cerebral atrophy. A subcentimeter hyperdense focus in the medial left frontal lobe corresponds to susceptibility/chronic blood products on multiple prior MRI examinations. Scattered hypodensities in the cerebral white matter bilaterally are nonspecific but compatible with mild chronic small vessel ischemic disease. Calcified atherosclerosis at the skull base. ORBITS: Left cataract extraction. SINUSES: Persistent near complete opacification of the left frontal sinus. Clear mastoid air cells. SOFT TISSUES AND SKULL: Left parietal craniotomy. No acute soft tissue abnormality. No skull fracture. IMPRESSION: 1. No acute intracranial abnormality. 2. Mild chronic small vessel ischemic disease. Electronically signed by: Dasie Hamburg MD MD 01/25/2024 12:07 PM EST RP Workstation: [id number]   DG Chest 2 View Result Date: 01/25/2024 CLINICAL DATA:  Weakness,  decreased appetite, dysuria. EXAM: CHEST - 2 VIEW COMPARISON:  08/11/2023 and CT chest 12/09/2023. FINDINGS: Trachea is midline. Heart size normal. New dense airspace consolidation in the right lower lobe with probable slight involvement of the right perihilar region. Tiny right pleural effusion. Biapical pleural thickening. Left lung is clear. IMPRESSION: 1. Right lower lobe pneumonia with a small right parapneumonic effusion. Followup PA and lateral chest X-ray is recommended in 3-4 weeks following trial of antibiotic therapy to ensure resolution and exclude underlying malignancy. 2. Probable mild involvement of the right perihilar region. Electronically Signed   By: Newell Eke M.D.   On: 01/25/2024 11:50    Family Communication: Discussed with patient, understand and agree. All questions answered.  Disposition: Status is: Inpatient Remains inpatient appropriate because: IV antibiotics, pneumonia versus malignancy.  PT OT evaluation  Planned Discharge Destination: Home with  Home Health     Time spent: 51 minutes  Author: Concepcion Riser, MD 01/26/2024 1:14 PM Secure chat 7am to 7pm For on call review [email or web address]    "

## 2024-01-26 NOTE — Plan of Care (Signed)

## 2024-01-26 NOTE — TOC Initial Note (Signed)
 Transition of Care [facility]) - Initial/Assessment Note   Patient Details  Name: Noah King MRN: [id number] Date of Birth: 30-Jan-1945  Transition of Care [facility] CM/SW Contact:    Duwaine GORMAN Aran, LCSW Phone Number: 01/26/2024, 1:32 PM  Clinical Narrative: PT evaluation recommended SNF. CSW spoke with daughter, Margit, as patient is currently oriented x1. Daughter is agreeable to SNF. FL2 done; PASRR received. Initial referral faxed out in hub. Care management awaiting bed offers.  Expected Discharge Plan: Skilled Nursing Facility Barriers to Discharge: Continued Medical Work up  Patient Goals and CMS Choice CMS Medicare.gov Compare Post Acute Care list provided to:: Patient Represenative (must comment) Choice offered to / list presented to : Adult Children  Expected Discharge Plan and Services In-house Referral: Clinical Social Work Post Acute Care Choice: Skilled Nursing Facility Living arrangements for the past 2 months: Single Family Home           DME Arranged: N/A DME Agency: NA  Prior Living Arrangements/Services Living arrangements for the past 2 months: Single Family Home Lives with:: Self Patient language and need for interpreter reviewed:: Yes Do you feel safe going back to the place where you live?: Yes      Need for Family Participation in Patient Care: Yes (Comment) (Patient oriented to self.) Care giver support system in place?: Yes (comment) Current home services: DME (Cane, rollator) Criminal Activity/Legal Involvement Pertinent to Current Situation/Hospitalization: No - Comment as needed  Activities of Daily Living ADL Screening (condition at time of admission) Independently performs ADLs?: Yes (appropriate for developmental age) Is the patient deaf or have difficulty hearing?: No Does the patient have difficulty seeing, even when wearing glasses/contacts?: No Does the patient have difficulty concentrating, remembering, or making decisions?: No  Permission  Sought/Granted Permission sought to share information with : Facility Industrial/product Designer granted to share information with : Yes, Verbal Permission Granted Permission granted to share info w AGENCY: SNFs  Emotional Assessment Orientation: : Oriented to Self Alcohol / Substance Use: Not Applicable Psych Involvement: No (comment)  Admission diagnosis:  Right lower lobe pneumonia [J18.9] Pneumonia of right lower lobe due to infectious organism [J18.9] Patient Active Problem List   Diagnosis Date Noted   Right lower lobe pneumonia 01/25/2024   GERD (gastroesophageal reflux disease) 01/25/2024   Metastasis to brain (HCC) 12/29/2023   Goals of care, counseling/discussion 12/24/2023   Malignant neoplasm of lower lobe of right lung (HCC) 09/10/2023   S/P craniotomy 08/21/2023   Pulmonary nodules 08/21/2023   Mediastinal lymphadenopathy 08/13/2023   Herpes zoster 12/13/2009   Polycythemia vera (HCC) 04/16/2007   Contracture of palmar fascia 04/16/2007   Hypothyroidism 08/29/2006   Hyperlipidemia 08/29/2006   Essential hypertension 08/29/2006   PCP:  Merna Huxley, NP Pharmacy:   [facility] [id number], [place] - [address] [place] [address] Phone: [phone] Fax: [phone]  Social Drivers of Health (SDOH) Social History: SDOH Screenings   Food Insecurity: No Food Insecurity (01/25/2024)  Housing: Low Risk (01/25/2024)  Transportation Needs: No Transportation Needs (01/25/2024)  Utilities: Not At Risk (01/25/2024)  Alcohol Screen: Low Risk (11/01/2021)  Depression (PHQ2-9): Low Risk (01/12/2024)  Financial Resource Strain: Low Risk (11/01/2021)  Physical Activity: Insufficiently Active (11/27/2023)  Social Connections: Socially Isolated (01/25/2024)  Stress: No Stress Concern Present (11/27/2023)  Tobacco Use: Medium Risk (12/18/2023)   SDOH Interventions:    Readmission Risk Interventions      No data to display

## 2024-01-27 ENCOUNTER — Telehealth: Payer: Self-pay | Admitting: Medical Oncology

## 2024-01-27 DIAGNOSIS — Z7189 Other specified counseling: Secondary | ICD-10-CM

## 2024-01-27 DIAGNOSIS — C7931 Secondary malignant neoplasm of brain: Secondary | ICD-10-CM

## 2024-01-27 DIAGNOSIS — C3432 Malignant neoplasm of lower lobe, left bronchus or lung: Secondary | ICD-10-CM | POA: Diagnosis not present

## 2024-01-27 DIAGNOSIS — Z515 Encounter for palliative care: Secondary | ICD-10-CM

## 2024-01-27 DIAGNOSIS — J90 Pleural effusion, not elsewhere classified: Secondary | ICD-10-CM

## 2024-01-27 DIAGNOSIS — I10 Essential (primary) hypertension: Secondary | ICD-10-CM | POA: Diagnosis not present

## 2024-01-27 DIAGNOSIS — R4189 Other symptoms and signs involving cognitive functions and awareness: Secondary | ICD-10-CM

## 2024-01-27 DIAGNOSIS — D61810 Antineoplastic chemotherapy induced pancytopenia: Secondary | ICD-10-CM | POA: Insufficient documentation

## 2024-01-27 DIAGNOSIS — J189 Pneumonia, unspecified organism: Secondary | ICD-10-CM | POA: Diagnosis not present

## 2024-01-27 DIAGNOSIS — J188 Other pneumonia, unspecified organism: Secondary | ICD-10-CM

## 2024-01-27 DIAGNOSIS — K219 Gastro-esophageal reflux disease without esophagitis: Secondary | ICD-10-CM | POA: Diagnosis not present

## 2024-01-27 DIAGNOSIS — C3431 Malignant neoplasm of lower lobe, right bronchus or lung: Secondary | ICD-10-CM | POA: Diagnosis not present

## 2024-01-27 MED ORDER — BOOST / RESOURCE BREEZE PO LIQD CUSTOM
1.0000 | Freq: Three times a day (TID) | ORAL | Status: DC
  Administered 2024-01-27 – 2024-02-04 (×15): 1 via ORAL

## 2024-01-27 MED ORDER — SODIUM CHLORIDE 0.9 % IV SOLN
3.0000 g | Freq: Four times a day (QID) | INTRAVENOUS | Status: AC
  Administered 2024-01-27 – 2024-01-30 (×14): 3 g via INTRAVENOUS
  Filled 2024-01-27 (×15): qty 8

## 2024-01-27 MED ORDER — SODIUM CHLORIDE 0.9 % IV SOLN: INTRAVENOUS | Status: AC

## 2024-01-27 NOTE — Evaluation (Signed)
 Clinical/Bedside Swallow Evaluation Patient Details  Name: Noah King MRN: [id number] Date of Birth: 03/10/1945  Today's Date: 01/27/2024 Time: SLP Start Time (ACUTE ONLY): 1427 SLP Stop Time (ACUTE ONLY): 1438 SLP Time Calculation (min) (ACUTE ONLY): 11 min  Past Medical History:  Past Medical History:  Diagnosis Date   Allergy    Cataract    left  extraction- right eye forming   Dupuytren's contracture    GERD (gastroesophageal reflux disease)    Hiatal hernia    past hx-  suspect at one time   Hypertension    Polycythemia    Past Surgical History:  Past Surgical History:  Procedure Laterality Date   APPLICATION OF CRANIAL NAVIGATION Left 08/21/2023   Procedure: COMPUTER-ASSISTED NAVIGATION, FOR CRANIAL PROCEDURE;  Surgeon: Debby Dorn MATSU, MD;  Location: [facility] OR;  Service: Neurosurgery;  Laterality: Left;   cataract extraction left eye     COLONOSCOPY     CRANIOTOMY Left 08/21/2023   Procedure: CRANIOTOMY TUMOR EXCISION;  Surgeon: Debby Dorn MATSU, MD;  Location: [facility] OR;  Service: Neurosurgery;  Laterality: Left;  LT CRANI FOR TUMOR RESECTION   DUPUYTREN CONTRACTURE RELEASE Left    HERNIA REPAIR  2024   POLYPECTOMY     rt knee arthroscopic     VIDEO BRONCHOSCOPY WITH ENDOBRONCHIAL ULTRASOUND Right 08/13/2023   Procedure: BRONCHOSCOPY, WITH EBUS;  Surgeon: Jude Harden GAILS, MD;  Location: [facility] ENDOSCOPY;  Service: Cardiopulmonary;  Laterality: Right;   HPI:  Noah King is a 79 yo male who presented to [facility] on 01/25/2024 due to poor PO intake, mild confusion and generalized weakness. Pt found to have RLL pna, onset was rapid. (CXR 2view RLL pna). PMH includes but is not limited to lung ca with brain mets s/p resection and palliative radiation and on chemotherapy, HTN, HLD, HTN, hyperlipidemia and GERD. Pt was followed by SLP for cognitive tx during July/Aug admission.    Assessment / Plan / Recommendation  Clinical Impression  PLAN: Continue regular solids, thin  liquids. Pt would benefit from MBS to determine physiology of swallow, potential for aspiration.    Pt participated in clinical assessment.  Aphasic.  He reports some burning when swallowing, occasional coughing.  He consumed solids and thin liquids with adequate mastication and no overt s/s of aspiration, but given comorbidities and susceptibility to developing pna in the setting of aspiration, it would be helpful to pursue instrumental testing.   SLP Visit Diagnosis: Dysphagia, unspecified (R13.10)    Aspiration Risk   (tba)    Diet Recommendation   Thin;Age appropriate regular  Medication Administration: Whole meds with liquid    Other Recommendations Oral Care Recommendations: Oral care BID       Swallow Study   General HPI: Noah King is a 79 yo male who presented [facility] on [facility]/25/2024 due to poor PO intake, mild confusion and generalized weakness. Pt found to have RLL pna, onset was rapid. (CXR 2view RLL pna). PMH includes but is not limited to lung ca with brain mets s/p resection and palliative radiation and on chemotherapy, HTN, HLD, HTN, hyperlipidemia and GERD. Pt was followed by SLP for cognitive tx during July/Aug admission. Type of Study: Bedside Swallow Evaluation Previous Swallow Assessment: no, only communication/cognitive evals Diet Prior to this Study: Regular;Thin liquids (Level 0) Temperature Spikes Noted: No Respiratory Status: Room air History of Recent Intubation: No Behavior/Cognition: Alert;Cooperative;Pleasant mood Oral Cavity Assessment: Within Functional Limits Oral Care Completed by SLP: Recent completion by staff Oral Cavity - Dentition:  Adequate natural dentition Vision: Functional for self-feeding Self-Feeding Abilities: Able to feed self Patient Positioning: Upright in bed Baseline Vocal Quality: Normal Volitional Cough: Strong Volitional Swallow: Able to elicit    Oral/Motor/Sensory Function Overall Oral Motor/Sensory Function: Within  functional limits   Ice Chips Ice chips: Within functional limits   Thin Liquid Thin Liquid: Within functional limits    Nectar Thick Nectar Thick Liquid: Not tested   Honey Thick Honey Thick Liquid: Not tested   Puree Puree: Within functional limits   Solid     Solid: Within functional limits      Vona Palma Laurice 01/27/2024,2:52 PM  Palma L. Vona, MA CCC/SLP Clinical Specialist - Acute Care SLP Acute Rehabilitation Services Office number [phone]

## 2024-01-27 NOTE — Consult Note (Signed)
" ° °  NAME:  Noah King, MRN:  [id number], DOB:  09-04-1945, LOS: 1 ADMISSION DATE:  01/25/2024, CONSULTATION DATE: 01/27/2024 REFERRING MD: GORMAN Pore MD, CHIEF COMPLAINT: Multilobar cavitary pneumonia  History of Present Illness:   79 year old with diagnosis of Stage IV (T1a, N2b, M1b) non-small cell lung cancer, adenocarcinoma with brain mets which is diagnosed with a brain biopsy after nondiagnostic bronchoscopy in August 2026.  Currently on chemotherapy presenting with generalized weakness, confusion.  Lung imaging shows bilateral lower lobe pneumonia with cavitation and PCCM has been consulted for help with management.  Currently on ceftriaxone  and doxycycline .  Pertinent  Medical History    has a past medical history of Allergy, Cataract, Dupuytren's contracture, GERD (gastroesophageal reflux disease), Hiatal hernia, Hypertension, and Polycythemia.   Significant Hospital Events: Including procedures, antibiotic start and stop dates in addition to other pertinent events   1/11 admit 1/13 PCCM consult  Interim History / Subjective:   Awake, confused. On room air in no distress.  Objective    Blood pressure 119/65, pulse 62, temperature 98.4 F (36.9 C), resp. rate 18, SpO2 95%.        Intake/Output Summary (Last 24 hours) at 01/27/2024 0941 Last data filed at 01/27/2024 0556 Gross per 24 hour  Intake 2249.28 ml  Output 650 ml  Net 1599.28 ml   There were no vitals filed for this visit.  Examination: Awake, confused Focal deficits Heart rate regular rate and rhythm Lungs are clear to auscultation Abdomen soft Extremities with no edema  Lab/imaging reviewed Significant for a 132, BUN/creatinine 14/0.76 WBC 2.2, hemoglobin 8.9, platelets 96  CT chest with new bilateral lower lobe airspace consolidation with ground glass and cavitation.  Small right effusion.  Previously described lung nodules are obscured by consolidation.  Resolved problem list    Assessment and Plan  Cavitary pneumonia Stage IV lung adenocarcinoma with brain mets Bilateral lower lobe pneumonia with cavitation, likely infectious in nature, as it developed over three months and was not present in previous scan on 12/09/2023.  Symptoms include fatigue and weakness. Differential diagnosis includes lung cancer, but the rapid onset suggests pneumonia as the primary cause. - Initiated antibiotic treatment.  Change ceftriaxone  to Unasyn  for anaerobic coverage.  Continue doxycycline  -Swallow eval for possible aspiration -Pleural effusion is too small to drain at present. -Will need follow-up CT to ensure resolution of pneumonia.  Signature:   The patient is critically ill with multiple organ system failure and requires high complexity decision making for assessment and support, frequent evaluation and titration of therapies, advanced monitoring, review of radiographic studies and interpretation of complex data.   Critical Care Time devoted to patient care services, exclusive of separately billable procedures, described in this note is 35 minutes.   Doloris Servantes MD [facility] See Amion for pager  If no response to pager , please call [phone] until 7pm After 7:00 pm call Elink  [phone] 01/27/2024, 9:48 AM         "

## 2024-01-27 NOTE — Consult Note (Signed)
 " Consultation Note Date: 01/27/2024   Patient Name: Noah King  DOB: 1945/05/06  MRN: [id number]  Age / Sex: 79 y.o., male   PCP: Merna Huxley, NP Referring Physician: Darci Pore, MD  Reason for Consultation: Establishing goals of care     Chief Complaint/History of Present Illness:   Patient is a 79 year old male with a past medical history of metastatic lung cancer with mets to brain s/p resection and palliative radiation on chemotherapy, hypertension, and hyperlipidemia who was admitted on 01/25/2024 after noted generalized weakness, poor appetite, and mild confusion.  Patient's most recent chemotherapy was on 01/12/2024.  Since admission, imaging noted cavitary pneumonia.  Pulmonary consulted for recommendations.  Palliative medicine team consulted to assist with goals of care.  Reviewed documentation from today including note from pulmonologist, hospitalist, and speech pathologist.  Pulmonologist recommending change of antibiotics to assist with anaerobic coverage.  Pulmonologist also recommended swallow evaluation for possible aspiration.  SLP evaluated patient and did not note overt signs or symptoms of aspiration though given his comorbidities, recommended MBS to determine physiology of swallow and potential for aspiration.  Hospitalist continuing with appropriate medical management and encouraging oral intake and incentive spirometry. Personally reviewed chest CT from 01/26/2024 noting bilateral lower lobe airspace consolidations with suspected cavitation most consistent with necrotizing pneumonia.  Patient also noted to have small right pleural effusion. Most recent CBC from 01/26/2024 noting WBC low at 2.2 and platelets low at 96.  Patient had received chemotherapy on 01/12/2024. Discussed care with RN for medical updates.  Presented to bedside to see patient.  Patient lying comfortably in bed.  No visitors present at bedside.  Introduced myself as a member of the  palliative medicine team and my role in patient's medical care.  Patient alert and able to participate in conversation though can become tangential and confused at times.  Patient does not remember why he is in the hospital or that he has been seen by speech therapy today.  Patient states he spoke with Dr. Gatha yesterday though there is no note documenting that oncologist came to see patient. Tried to inquire about patient's wife outside of the hospital.  Patient notes that he lives alone.  Patient is able to state that he is not married and only has one daughter, Noah King, who would be his next of kin.  Tried to explore what patient enjoys doing when he is feeling well and not in the hospital.  Patient only able to express that he enjoys being outdoors though not able to elaborate further on this. Did inquire with patient about contacting his daughter and he agreed this would be beneficial. Inquired about any symptom concerns at this time and patient notes that he does not have any.  Noted palliative medicine team to continue to follow with patient's medical journey.  After visit with patient, able to call patient's daughter Noah King.  Introduced myself as a member of the palliative medicine team and my role in patient's medical journey. Noah King is a therapist at a SNF/rehab in [place] .  Spent time providing updates regarding patient's medical care.  Discussed concerns related to cavitary pneumonia.  Provided updates from PCCM documentation regarding antibiotic management.  Also noted patient was evaluated by speech she was planning on MBS for evaluation. Noah King very supportive of MBS to evaluate swallowing send she knows that silent aspiration can occur. Spent time learning about patient prior to hospitalization.  Daughter noted that on the Thursday prior to admission, patient's mentation had suddenly  declined.  Patient was not wanting to go into the hospital for workup though on Sunday she was very  worried about him and called EMS.  She noted that prior to this acute change, patient was very intelligent and interactive.  Patient was discussing planning with his financial planner and trading on the stock market.  With this acute change, daughter is hopeful that with medical management patient can return to his prior state.  Noted would discuss with care team while continuing appropriate medical management at this time. Did confirm daughter status as patient's only child.  She noted that she is HCPOA.  She feels that patient has previously completed paperwork at cancer center regarding wishes for care though noted not available in EMR ACP documents.  She did state that she believes patient is to be full code as he was incredibly functional a week ago.  Acknowledged wishes for care.  Spent time answering questions as able at that time.  Noted palliative medicine team will continue to follow along with patient's medical journey.  Daughter voiced appreciation for the call and discussion.  Updated care team including hospitalist, RN, and SLP regarding discussion to coordinate care.   Primary Diagnoses  Present on Admission:  Right lower lobe pneumonia  Essential hypertension  Hyperlipidemia  Malignant neoplasm of lower lobe of right lung (HCC)  GERD (gastroesophageal reflux disease)   Palliative Review of Systems: Denies any symptoms of concern  Past Medical History:  Diagnosis Date   Allergy    Cataract    left  extraction- right eye forming   Dupuytren's contracture    GERD (gastroesophageal reflux disease)    Hiatal hernia    past hx-  suspect at one time   Hypertension    Polycythemia    Social History   Socioeconomic History   Marital status: Single    Spouse name: Not on file   Number of children: Not on file   Years of education: Not on file   Highest education level: Not on file  Occupational History   Not on file  Tobacco Use   Smoking status: Former    Current  packs/day: 0.00    Types: Cigarettes    Quit date: 01/14/1985    Years since quitting: 39.0   Smokeless tobacco: Never  Vaping Use   Vaping status: Never Used  Substance and Sexual Activity   Alcohol use: Yes    Alcohol/week: 20.0 standard drinks of alcohol    Types: 20 Cans of beer per week   Drug use: No   Sexual activity: Yes    Partners: Female  Other Topics Concern   Not on file  Social History Narrative   Retired- He worked at [employer]   Divorced   One daughter who lives in [place]   Social Drivers of Health   Tobacco Use: Medium Risk (12/18/2023)   Patient History    Smoking Tobacco Use: Former    Smokeless Tobacco Use: Never    Passive Exposure: Not on Actuary Strain: Low Risk (11/01/2021)   Overall Financial Resource Strain (CARDIA)    Difficulty of Paying Living Expenses: Not hard at all  Food Insecurity: No Food Insecurity (01/25/2024)   Epic    Worried About Programme Researcher, Broadcasting/film/video in the Last Year: Never true    Ran Out of Food in the Last Year: Never true  Transportation Needs: No Transportation Needs (01/25/2024)   Epic    Lack of Transportation (Medical): No  Lack of Transportation (Non-Medical): No  Physical Activity: Insufficiently Active (11/27/2023)   Exercise Vital Sign    Days of Exercise per Week: 7 days    Minutes of Exercise per Session: 20 min  Stress: No Stress Concern Present (11/27/2023)   [employer] of Occupational Health - Occupational Stress Questionnaire    Feeling of Stress: Not at all  Social Connections: Socially Isolated (01/25/2024)   Social Connection and Isolation Panel    Frequency of Communication with Friends and Family: Three times a week    Frequency of Social Gatherings with Friends and Family: Three times a week    Attends Religious Services: Never    Active Member of Clubs or Organizations: No    Attends Banker Meetings: Never    Marital Status: Divorced  Depression (PHQ2-9): Low Risk  (01/12/2024)   Depression (PHQ2-9)    PHQ-2 Score: 0  Alcohol Screen: Low Risk (11/01/2021)   Alcohol Screen    Last Alcohol Screening Score (AUDIT): 5  Housing: Low Risk (01/25/2024)   Epic    Unable to Pay for Housing in the Last Year: No    Number of Times Moved in the Last Year: 0    Homeless in the Last Year: No  Utilities: Not At Risk (01/25/2024)   Epic    Threatened with loss of utilities: No  Health Literacy: Not on file   Family History  Problem Relation Age of Onset   Stroke Mother    Colon cancer Father 67       d. 87   Breast cancer Sister    Colon polyps Neg Hx    Esophageal cancer Neg Hx    Scheduled Meds:  atorvastatin   20 mg Oral Daily   enoxaparin  (LOVENOX ) injection  40 mg Subcutaneous Q24H   feeding supplement  1 Container Oral TID WC   folic acid   1 mg Oral Daily   guaiFENesin   600 mg Oral BID   magnesium  oxide  400 mg Oral Daily   pantoprazole   40 mg Oral Daily   Continuous Infusions:  ampicillin -sulbactam (UNASYN ) IV 3 g (01/27/24 1204)   doxycycline  (VIBRAMYCIN ) IV 100 mg (01/27/24 1444)   PRN Meds:.acetaminophen  **OR** acetaminophen , albuterol , docusate sodium , prochlorperazine , senna-docusate Allergies[1] CBC:    Component Value Date/Time   WBC 2.2 (L) 01/26/2024 0438   HGB 8.9 (L) 01/26/2024 0438   HGB 12.0 (L) 01/12/2024 1339   HGB 15.3 02/09/2009 1121   HGB 15.3 07/01/2007 1321   HCT 25.6 (L) 01/26/2024 0438   HCT 45.3 02/09/2009 1121   HCT 43.9 07/01/2007 1321   PLT 96 (L) 01/26/2024 0438   PLT 248 01/12/2024 1339   PLT 184 02/09/2009 1121   PLT 157 07/01/2007 1321   MCV 88.6 01/26/2024 0438   MCV 92 02/09/2009 1121   MCV 92.1 07/01/2007 1321   NEUTROABS 1.2 (L) 01/25/2024 1155   NEUTROABS 3.1 02/09/2009 1121   NEUTROABS 3.3 07/01/2007 1321   LYMPHSABS 0.3 (L) 01/25/2024 1155   LYMPHSABS 1.4 02/09/2009 1121   LYMPHSABS 0.8 (L) 07/01/2007 1321   MONOABS 0.4 01/25/2024 1155   MONOABS 0.5 07/01/2007 1321   EOSABS 0.0  01/25/2024 1155   EOSABS 0.4 02/09/2009 1121   BASOSABS 0.0 01/25/2024 1155   BASOSABS 0.0 02/09/2009 1121   BASOSABS 0.0 07/01/2007 1321   Comprehensive Metabolic Panel:    Component Value Date/Time   NA 132 (L) 01/26/2024 0438   K 4.1 01/26/2024 0438   CL 97 (L) 01/26/2024  0438   CO2 26 01/26/2024 0438   BUN 14 01/26/2024 0438   CREATININE 0.76 01/26/2024 0438   CREATININE 0.75 01/12/2024 1339   CREATININE 0.78 12/08/2019 0819   GLUCOSE 88 01/26/2024 0438   CALCIUM  8.3 (L) 01/26/2024 0438   AST 18 01/12/2024 1339   ALT 12 01/12/2024 1339   ALKPHOS 92 01/12/2024 1339   BILITOT 0.8 01/12/2024 1339   PROT 7.0 01/12/2024 1339   ALBUMIN  3.9 01/12/2024 1339    Physical Exam: Vital Signs: BP (!) 108/55 (BP Location: Right Arm)   Pulse 65   Temp 98.4 F (36.9 C)   Resp 18   SpO2 97%  SpO2: SpO2: 97 % O2 Device: O2 Device: Room Air O2 Flow Rate:   Intake/output summary:  Intake/Output Summary (Last 24 hours) at 01/27/2024 1455 Last data filed at 01/27/2024 1400 Gross per 24 hour  Intake 2249.28 ml  Output 650 ml  Net 1599.28 ml   LBM: Last BM Date : 01/26/24 Baseline Weight:   Most recent weight:    General: NAD, awake, easily confused and tangential in thoughts, chronically ill-appearing Cardiovascular: RRR Respiratory: no increased work of breathing noted, not in respiratory distress Abdomen: not distended Neuro: easily confused and tangential in thoughts          Palliative Performance Scale: 40%              Additional Data Reviewed: Recent Labs    01/25/24 1155 01/26/24 0438  WBC 1.9* 2.2*  HGB 10.7* 8.9*  PLT 105* 96*  NA 132* 132*  BUN 14 14  CREATININE 0.82 0.76    Imaging: CT CHEST W CONTRAST EXAM: CT CHEST WITH CONTRAST 01/26/2024 06:05:00 PM  TECHNIQUE: CT of the chest was performed with the administration of intravenous contrast. Multiplanar reformatted images are provided for review. Automated exposure control, iterative  reconstruction, and/or weight based adjustment of the mA/kV was utilized to reduce the radiation dose to as low as reasonably achievable.  COMPARISON: 12/09/2023  CLINICAL HISTORY: Restaging non-small cell lung cancer.  FINDINGS:  MEDIASTINUM: Aortic atherosclerosis and coronary artery calcifications. No pericardial effusion. High right paratracheal lymph node is increased in size from the previous exam, measuring 9 mm. Previously 5 mm. The previous right hilar lymph node measures 1.4 cm, image 81/2. Previously 1.1 cm.  LYMPH NODES: High-riding paratracheal lymph node is increased in size from the previous exam, measuring 9 mm. Previously 5 mm. The previous right hilar lymph node measures 1.4 cm, image 81/2. Previously 1.1 cm.  LUNGS AND PLEURA: New airspace consolidation involving greater than 50% of the right lower lobe is noted. There are 2 peripheral areas of subpleural lucency in this area which may reflect areas of cavitation. Band-like area of consolidative change within the basilar right upper lobe also noted. Index nodules within the previous left lower lobe are obscured by the new consolidative change. Within the lateral left lower lobe, there is a 4 mm lung nodule, image 114/6. Stable from previous exam. Right lower lobe lung nodules referenced on the previous exam are obscured by new consolidative change. Irregular area of scarring and architectural distortion within the medial right apex is unchanged, image 33/6. There is also new airspace consolidation and ground-glass attenuation involving the posterior and medial left lower lobe. There is a subpleural lucency in this area measuring 1.4 cm, image 103/6. This may reflect internal cavitation. There is a small right pleural effusion which is noted overlying the posterior right upper lobe, new from previous exam.  SOFT TISSUES/BONES: No acute abnormality of the bones or soft tissues.  UPPER ABDOMEN: The adrenal glands  are normal size and morphology bilaterally. No nodule, thickening, or hemorrhage. No periadrenal stranding.  IMPRESSION: 1. New bilateral lower lobe airspace consolidation with ground-glass change and suspected cavitation, with additional band-like consolidation in the basilar right upper lobe, most consistent with necrotizing pneumonia (consider aspiration and septic emboli), with recommended short-interval follow-up chest CT in 6-8 weeks to document improvement and exclude an underlying malignant process. 2. Small right pleural effusion, new from prior exam. 3. Interval enlargement of a high-riding paratracheal lymph node and right hilar lymph node, suspicious for nodal metastatic disease versus reactive adenopathy. 4. Previously described lung nodules are partially obscured by new consolidation, limiting assessment; a 4 mm left lower lobe nodule is unchanged.  Electronically signed by: Waddell Calk MD MD 01/27/2024 08:13 AM EST RP Workstation: [id number]    I personally reviewed recent imaging.   Palliative Care Assessment and Plan Summary of Established Goals of Care and Medical Treatment Preferences   Patient is a 79 year old male with a past medical history of metastatic lung cancer with mets to brain s/p resection and palliative radiation on chemotherapy, hypertension, and hyperlipidemia who was admitted on 01/25/2024 after noted generalized weakness, poor appetite, and mild confusion.  Patient's most recent chemotherapy was on 01/12/2024.  Since admission, imaging noted cavitary pneumonia.  Pulmonary consulted for recommendations.  Palliative medicine team consulted to assist with goals of care.  # Complex medical decision making/goals of care  - Patient unable to participate in complex medical decisions currently due to his underlying medical status.   - Discussed care with daughter/NOK as detailed above in HPI.  As per daughter's report, normally at baseline patient able to  participate in complex discussions.  Daughter describes acute change last Thursday after patient falling ill.  Daughter notes prior to that Thursday, patient was trading stocks online and participating in biomedical engineer.  Daughter is hopeful with continued aggressive medical interventions patient will be able to return to prior baseline.  Hospitalist planning to involve oncologist for further recommendations as well.  Palliative medicine team continuing to follow along with patient's medical journey.  -  Code Status: Full Code   # Psycho-social/Spiritual Support:  - Support System: Daughter: Noah King reported she is NOK as patient's only daughter   # Discharge Planning:  To Be Determined  Thank you for allowing the palliative care team to participate in the care Noah King.  Tinnie Radar, DO Palliative Care Provider PMT # [phone]  If patient remains symptomatic despite maximum doses, please call PMT at [phone] between 0700 and 1900. Outside of these hours, please call attending, as PMT does not have night coverage.  Billing based on MDM: High  Problems Addressed: One or more chronic illnesses with severe exacerbation, progression, or side effects of treatment.  Amount and/or Complexity of Data: Category 1:Review of prior external note(s) from each unique source, Review of the result(s) of each unique test, and Assessment requiring an independent historian(s) and Category 3:Discussion of management or test interpretation with external physician/other qualified health care professional/appropriate source (not separately reported)      [1]  Allergies Allergen Reactions   Lotensin  [Benazepril  Hcl] Cough   "

## 2024-01-27 NOTE — Telephone Encounter (Signed)
 Consuelo called to notify pt is in hospital for pneumonia.

## 2024-01-27 NOTE — Progress Notes (Signed)
 Mobility Specialist Progress Note:   01/27/24 1121  Mobility  Activity  (Bed Exercises)  Range of Motion/Exercises Active  Activity Response Tolerated well  Mobility Referral Yes  Mobility visit 1 Mobility  Mobility Specialist Start Time (ACUTE ONLY) 0908  Mobility Specialist Stop Time (ACUTE ONLY) 0918  Mobility Specialist Time Calculation (min) (ACUTE ONLY) 10 min   Pt was received in bed and agreed to bed exercises: Seated BLE Exercises:  1) Toe Raise/ Heel Raise: 1 x 10  2) Ankle Pumps: 1 x 10  3) Marching: 1 x 10  Pt had no complaints/issues during session. Returned to bed with all needs met. Call bell in reach.  [employer] - Mobility Specialist

## 2024-01-27 NOTE — Plan of Care (Signed)

## 2024-01-27 NOTE — Progress Notes (Addendum)
 " Progress Note   Patient: Noah King FMW:[id number] DOB: 01/02/1946 DOA: 01/25/2024     1 DOS: the patient was seen and examined on 01/27/2024   Brief hospital course: Link C Kruzel is a 79 y.o. male with medical history significant of lung cancer with brain mets s/p resection and palliative radiation, on chemotherapy, hypertension, hyperlipidemia presented to [facility] ED via EMS for evaluation of profound generalized weakness, poor appetite, mild confusion for the past 1-2 weeks.  Recent chemo treatment on 01/12/24.  Since that time, patient has become progressively more weak and has not been eating or drinking much of anything.  He has a cough that is apparently stable and chronic for over 20 years, has not seemed to worsen recently.  No fever, chills, rigors of sore throat.  No nausea vomiting. CT head without contrast no acute findings, mild chronic small vessel ischemic disease. Chest x-ray showed right lower lobe pneumonia with small right parapneumonic effusion. Patient is started on Rocephin , doxycycline  in the ED with IV hydration admitted to [facility] service for further management evaluation.  Assessment and Plan: * Right lower lobe pneumonia Generalized weakness Anorexia Chest x-ray showing RLL PNA with small right parapneumonic effusion.  Patient does have non-small cell lung cancer involving the RLL. CT chest revealed necrotizing lesion. Pulmonary follow up appreciated - Rocephin  changed to Unasyn . Check sputum culture, antigens for S pneumo and legionella MRSA screen negative. Encourage incentive spirometry, out of bed. PT/OT follow up. Fall and aspiration precautions.  Malignant neoplasm of lower lobe of right lung (HCC) With mets to brain s/p craniotomy and resection of left parietal lesion, with new 3mm left front lobe lesion on MRI 12/23/24, currently on palliative radiation. Follows with Dr. Sherrod and Buckley.  Last chemo on 01/12/24.   He does have expressive  aphasia, unsteady gait. Palliative Care consulted for goals of care discussion.  Essential hypertension BP soft. Continue to hold home losartan  for now, resume when BP warrants.  GERD (gastroesophageal reflux disease) Continue PPI  Hyperlipidemia Continue statin  Pancytopenia- Possibly from chemotherapy. Continue to trend CBC.     Out of bed to chair. Incentive spirometry. Nursing supportive care. Fall, aspiration precautions. Diet:  Diet Orders (From admission, onward)     Start     Ordered   01/25/24 1356  Diet regular Room service appropriate? Yes; Fluid consistency: Thin  Diet effective now       Question Answer Comment  Room service appropriate? Yes   Fluid consistency: Thin      01/25/24 1356           DVT prophylaxis: enoxaparin  (LOVENOX ) injection 40 mg Start: 01/25/24 2200  Level of care: Med-Surg   Code Status: Full Code  Subjective: Patient is seen and examined today morning. He is lying in bed. Eating poor.  No cough.  Did not get out of bed.  Physical Exam: Vitals:   01/26/24 1310 01/26/24 2109 01/26/24 2331 01/27/24 0539  BP: 102/63 (!) 102/55  119/65  Pulse: 63 70  62  Resp:  19  18  Temp: 97.9 F (36.6 C) (!) 100.8 F (38.2 C) 98.2 F (36.8 C) 98.4 F (36.9 C)  TempSrc: Oral  Oral   SpO2: 90% 96%  95%    General - Elderly ill appearing Caucasian male, no apparent distress HEENT - PERRLA, EOMI, atraumatic head, non tender sinuses. Lung - Clear, basal rales, rhonchi, no wheezes. Heart - S1, S2 heard, no murmurs, rubs, trace pedal edema. Abdomen -  Soft, non tender, bowel sounds good Neuro - Alert, awake and oriented x 1, slow mentation, non focal exam. Skin - Warm and dry.  Data Reviewed:      Latest Ref Rng & Units 01/26/2024    4:38 AM 01/25/2024   11:55 AM 01/12/2024    1:39 PM  CBC  WBC 4.0 - 10.5 K/uL 2.2  1.9  4.4   Hemoglobin 13.0 - 17.0 g/dL 8.9  89.2  87.9   Hematocrit 39.0 - 52.0 % 25.6  30.7  34.7   Platelets 150 -  400 K/uL 96  105  248       Latest Ref Rng & Units 01/26/2024    4:38 AM 01/25/2024   11:55 AM 01/12/2024    1:39 PM  BMP  Glucose 70 - 99 mg/dL 88  96  893   BUN 8 - 23 mg/dL 14  14  11    Creatinine 0.61 - 1.24 mg/dL 9.23  9.17  9.24   Sodium 135 - 145 mmol/L 132  132  137   Potassium 3.5 - 5.1 mmol/L 4.1  3.8  4.0   Chloride 98 - 111 mmol/L 97  94  100   CO2 22 - 32 mmol/L 26  27  26    Calcium  8.9 - 10.3 mg/dL 8.3  9.1  9.3    CT CHEST W CONTRAST Result Date: 01/27/2024 EXAM: CT CHEST WITH CONTRAST 01/26/2024 06:05:00 PM TECHNIQUE: CT of the chest was performed with the administration of intravenous contrast. Multiplanar reformatted images are provided for review. Automated exposure control, iterative reconstruction, and/or weight based adjustment of the mA/kV was utilized to reduce the radiation dose to as low as reasonably achievable. COMPARISON: 12/09/2023 CLINICAL HISTORY: Restaging non-small cell lung cancer. FINDINGS: MEDIASTINUM: Aortic atherosclerosis and coronary artery calcifications. No pericardial effusion. High right paratracheal lymph node is increased in size from the previous exam, measuring 9 mm. Previously 5 mm. The previous right hilar lymph node measures 1.4 cm, image 81/2. Previously 1.1 cm. LYMPH NODES: High-riding paratracheal lymph node is increased in size from the previous exam, measuring 9 mm. Previously 5 mm. The previous right hilar lymph node measures 1.4 cm, image 81/2. Previously 1.1 cm. LUNGS AND PLEURA: New airspace consolidation involving greater than 50% of the right lower lobe is noted. There are 2 peripheral areas of subpleural lucency in this area which may reflect areas of cavitation. Band-like area of consolidative change within the basilar right upper lobe also noted. Index nodules within the previous left lower lobe are obscured by the new consolidative change. Within the lateral left lower lobe, there is a 4 mm lung nodule, image 114/6. Stable from  previous exam. Right lower lobe lung nodules referenced on the previous exam are obscured by new consolidative change. Irregular area of scarring and architectural distortion within the medial right apex is unchanged, image 33/6. There is also new airspace consolidation and ground-glass attenuation involving the posterior and medial left lower lobe. There is a subpleural lucency in this area measuring 1.4 cm, image 103/6. This may reflect internal cavitation. There is a small right pleural effusion which is noted overlying the posterior right upper lobe, new from previous exam. SOFT TISSUES/BONES: No acute abnormality of the bones or soft tissues. UPPER ABDOMEN: The adrenal glands are normal size and morphology bilaterally. No nodule, thickening, or hemorrhage. No periadrenal stranding. IMPRESSION: 1. New bilateral lower lobe airspace consolidation with ground-glass change and suspected cavitation, with additional band-like consolidation in the basilar right  upper lobe, most consistent with necrotizing pneumonia (consider aspiration and septic emboli), with recommended short-interval follow-up chest CT in 6-8 weeks to document improvement and exclude an underlying malignant process. 2. Small right pleural effusion, new from prior exam. 3. Interval enlargement of a high-riding paratracheal lymph node and right hilar lymph node, suspicious for nodal metastatic disease versus reactive adenopathy. 4. Previously described lung nodules are partially obscured by new consolidation, limiting assessment; a 4 mm left lower lobe nodule is unchanged. Electronically signed by: Waddell Calk MD MD 01/27/2024 08:13 AM EST RP Workstation: [id number]    Family Communication: Discussed with patient, understand and agree. All questions answered.  He does not want me to call his daughter.  Disposition: Status is: Inpatient Remains inpatient appropriate because: IV antibiotics, pneumonia versus malignancy.  Pulmonary follow-up   Planned Discharge Destination: Home with Home Health     Time spent: 51 minutes  Author: Concepcion Riser, MD 01/27/2024 12:30 PM Secure chat 7am to 7pm For on call review [email or web address].    "

## 2024-01-27 NOTE — Progress Notes (Signed)
 Mobility Specialist Progress Note:   01/27/24 1326  Mobility  Activity Dangled on edge of bed  Level of Assistance Minimal assist, patient does 75% or more  Range of Motion/Exercises Active  Activity Response Tolerated well  Mobility Referral Yes  Mobility visit 1 Mobility  Mobility Specialist Start Time (ACUTE ONLY) 1302  Mobility Specialist Stop Time (ACUTE ONLY) 1321  Mobility Specialist Time Calculation (min) (ACUTE ONLY) 19 min   Pt was received in bed and agreed to mobility. Pt had slight lightheadedness at edge of bed, but quickly subsided. Seated BLE Exercises: 10 reps each  1) Ankle Pumps  2) Knee Extension  3) Marching    Pt had no complaints/issues during session. Returned to bed with all needs met. Call bell in reach and bed alarm on.  [employer] - Mobility Specialist

## 2024-01-28 ENCOUNTER — Inpatient Hospital Stay (HOSPITAL_COMMUNITY)

## 2024-01-28 DIAGNOSIS — E43 Unspecified severe protein-calorie malnutrition: Secondary | ICD-10-CM | POA: Diagnosis not present

## 2024-01-28 DIAGNOSIS — D709 Neutropenia, unspecified: Secondary | ICD-10-CM | POA: Diagnosis not present

## 2024-01-28 DIAGNOSIS — C7931 Secondary malignant neoplasm of brain: Secondary | ICD-10-CM

## 2024-01-28 DIAGNOSIS — C3432 Malignant neoplasm of lower lobe, left bronchus or lung: Secondary | ICD-10-CM | POA: Diagnosis not present

## 2024-01-28 DIAGNOSIS — R531 Weakness: Secondary | ICD-10-CM

## 2024-01-28 DIAGNOSIS — C3431 Malignant neoplasm of lower lobe, right bronchus or lung: Secondary | ICD-10-CM | POA: Diagnosis not present

## 2024-01-28 DIAGNOSIS — D61810 Antineoplastic chemotherapy induced pancytopenia: Secondary | ICD-10-CM | POA: Diagnosis not present

## 2024-01-28 DIAGNOSIS — J189 Pneumonia, unspecified organism: Secondary | ICD-10-CM | POA: Diagnosis not present

## 2024-01-28 DIAGNOSIS — J90 Pleural effusion, not elsewhere classified: Secondary | ICD-10-CM | POA: Diagnosis not present

## 2024-01-28 LAB — DIFFERENTIAL
Abs Immature Granulocytes: 0.01 K/uL (ref 0.00–0.07)
Basophils Absolute: 0 K/uL (ref 0.0–0.1)
Basophils Relative: 0 %
Eosinophils Absolute: 0 K/uL (ref 0.0–0.5)
Eosinophils Relative: 1 %
Immature Granulocytes: 1 %
Lymphocytes Relative: 13 %
Lymphs Abs: 0.2 K/uL — ABNORMAL LOW (ref 0.7–4.0)
Monocytes Absolute: 0.3 K/uL (ref 0.1–1.0)
Monocytes Relative: 23 %
Neutro Abs: 0.9 K/uL — ABNORMAL LOW (ref 1.7–7.7)
Neutrophils Relative %: 62 %
Smear Review: NORMAL

## 2024-01-28 LAB — BASIC METABOLIC PANEL WITH GFR
Anion gap: 9 (ref 5–15)
BUN: 8 mg/dL (ref 8–23)
CO2: 25 mmol/L (ref 22–32)
Calcium: 7.9 mg/dL — ABNORMAL LOW (ref 8.9–10.3)
Chloride: 99 mmol/L (ref 98–111)
Creatinine, Ser: 0.64 mg/dL (ref 0.61–1.24)
GFR, Estimated: >60 mL/min
Glucose, Bld: 104 mg/dL — ABNORMAL HIGH (ref 70–99)
Potassium: 3.2 mmol/L — ABNORMAL LOW (ref 3.5–5.1)
Sodium: 133 mmol/L — ABNORMAL LOW (ref 135–145)

## 2024-01-28 LAB — CBC
HCT: 24.6 % — ABNORMAL LOW (ref 39.0–52.0)
Hemoglobin: 8.7 g/dL — ABNORMAL LOW (ref 13.0–17.0)
MCH: 31.2 pg (ref 26.0–34.0)
MCHC: 35.4 g/dL (ref 30.0–36.0)
MCV: 88.2 fL (ref 80.0–100.0)
Platelets: 113 K/uL — ABNORMAL LOW (ref 150–400)
RBC: 2.79 MIL/uL — ABNORMAL LOW (ref 4.22–5.81)
RDW: 12.2 % (ref 11.5–15.5)
WBC: 1.5 K/uL — ABNORMAL LOW (ref 4.0–10.5)
nRBC: 0 % (ref 0.0–0.2)

## 2024-01-28 MED ORDER — DOXYCYCLINE HYCLATE 100 MG PO TABS
100.0000 mg | ORAL_TABLET | Freq: Two times a day (BID) | ORAL | Status: DC
  Administered 2024-01-28 – 2024-01-30 (×4): 100 mg via ORAL
  Filled 2024-01-28 (×4): qty 1

## 2024-01-28 MED ORDER — POTASSIUM CHLORIDE 10 MEQ/100ML IV SOLN
10.0000 meq | INTRAVENOUS | Status: AC
  Administered 2024-01-28: 10 meq via INTRAVENOUS
  Filled 2024-01-28 (×3): qty 100

## 2024-01-28 MED ORDER — FILGRASTIM-AAFI 300 MCG/0.5ML IJ SOSY
300.0000 ug | PREFILLED_SYRINGE | Freq: Every day | INTRAMUSCULAR | Status: DC
  Administered 2024-01-28: 300 ug via SUBCUTANEOUS
  Filled 2024-01-28 (×2): qty 0.5

## 2024-01-28 NOTE — Progress Notes (Signed)
 Physical Therapy Treatment Patient Details Name: Noah King MRN: [id number] DOB: [birth year] Today's Date: 01/28/2024   History of Present Illness 79 yo male presents to therapy following hospital admission on 01/25/2024 due to poor PO intake, mild confusion and generalized weakness. Pt found to have PNA. Pt PMH includes but is not limited to lung ca with brain mets s/p resection and palliative radiation and on chemotherapy, HTN, HLD, HTN, hyperlipidemia and GERD.    PT Comments  Pt asleep upon arrival, awakes easily, states name and location, agreeable to therapy and agreeable to breakfast tray at bedside. After ~3 minutes of talking to therapist, pt becomes more lethargic, denies pain, denies dizziness, without complaints. Assisted pt to bedside with mod A+2 using bedpad, increased multimodal cues and time to mobilize with no improvement or worsening of alertness. Encouraged and guided pt through LAQ but pt appears distracted and with decreased initiation. Pt needing max A+2 for initial sit to stand from elevated bedside, BLE noted to slide and bil knee hyperextension limiting, trunk forward flexed. On 2nd sit to stand from elevated bedside, pt pulling from therapist's hand, powers up with mod A+2, bil feet blocked strongly to prevent sliding, still noted to have bil knee hyperextension and forward flexed trunk. Pt tolerates standing <20 sec each time, no improvement or worsening cognition, pt only complaint tired. Pt needing min A to return to supine safely. RN into room, pt needing mod-min A for rolling R/L for linen change, appears to be sleeping but does open eyes briefly to verbal commands. Pt on RA with SpO2 86-91% and HR 60s, though poor waveform- RN aware. Continue to recommend continued inpatient follow up therapy, <3 hours/day.   If plan is discharge home, recommend the following: Two people to help with walking and/or transfers;A lot of help with bathing/dressing/bathroom;Assistance  with cooking/housework;Assist for transportation;Help with stairs or ramp for entrance;Supervision due to cognitive status;Direct supervision/assist for financial management;Direct supervision/assist for medications management   Can travel by private vehicle     No  Equipment Recommendations  None recommended by PT    Recommendations for Other Services       Precautions / Restrictions Precautions Precautions: Fall Recall of Precautions/Restrictions: Impaired Restrictions Weight Bearing Restrictions Per Provider Order: No     Mobility  Bed Mobility Overal bed mobility: Needs Assistance Bed Mobility: Supine to Sit, Sit to Supine, Rolling Rolling: Mod assist, Min assist   Supine to sit: Mod assist, +2 for physical assistance, HOB elevated Sit to supine: Min assist   General bed mobility comments: decreased initiation, ultimately mod A+2 with bedpad to mobilize to sitting at bedside, cues for sequencing and hand placement, therapist managing lines for safety; min A to safely return to supine as pt pushing back into bed; mod-min A for rolling R/L for linen change    Transfers Overall transfer level: Needs assistance   Transfers: Sit to/from Stand Sit to Stand: Max assist, Mod assist, +2 physical assistance, +2 safety/equipment           General transfer comment: max A+2 for initial power to stand, BLE sliding requiring blocking, bil knee hyperextension wtih weight shifted posterior and kyphotic trunk posture; on 2nd attempt pt pulls from therapist's hand, BLE feet blocked strongly to prevent sliding, knee hyperextension still noted and weight posterior, kyphotic posture, tolerates standing <20 sec each time    Ambulation/Gait                   Stairs  Wheelchair Mobility     Tilt Bed    Modified Rankin (Stroke Patients Only)       Balance Overall balance assessment: History of Falls, Needs assistance Sitting-balance support: Feet  supported, Bilateral upper extremity supported Sitting balance-Leahy Scale: Fair   Postural control: Posterior lean Standing balance support: Bilateral upper extremity supported, During functional activity, Reliant on assistive device for balance Standing balance-Leahy Scale: Poor Standing balance comment: posterior lean                            Communication Communication Communication: Impaired Factors Affecting Communication: Difficulty expressing self  Cognition Arousal: Lethargic, Alert Behavior During Therapy: Flat affect, Restless   PT - Cognitive impairments: History of cognitive impairments, No family/caregiver present to determine baseline, Difficult to assess Difficult to assess due to: Level of arousal                     PT - Cognition Comments: pt able to state name and Noah King, unable to state year. Upon therapist's entry pt opens eyes and conversational, looking around room, after ~3 minutes pt becomes more lethargic while still supined in bed, assisted pt to sitting and standing at bedside with minimal improvement in alertness, returned to supine and pt closes eyes, reports tired and rolls to side appearing to sleep. Pt with decreased initiation throughout, decreased awareness, needing cues for mobility and safety Following commands: Impaired Following commands impaired: Follows one step commands inconsistently, Follows one step commands with increased time    Cueing Cueing Techniques: Verbal cues, Gestural cues, Tactile cues, Visual cues  Exercises      General Comments        Pertinent Vitals/Pain Pain Assessment Pain Assessment: No/denies pain Faces Pain Scale: No hurt    Home Living                          Prior Function            PT Goals (current goals can now be found in the care plan section) Progress towards PT goals: Not progressing toward goals - comment (limited by lethargy)    Frequency     Min 2X/week      PT Plan      Co-evaluation              AM-PAC PT 6 Clicks Mobility   Outcome Measure  Help needed turning from your back to your side while in a flat bed without using bedrails?: A Lot Help needed moving from lying on your back to sitting on the side of a flat bed without using bedrails?: A Lot Help needed moving to and from a bed to a chair (including a wheelchair)?: Total Help needed standing up from a chair using your arms (e.g., wheelchair or bedside chair)?: Total Help needed to walk in hospital room?: Total Help needed climbing 3-5 steps with a railing? : Total 6 Click Score: 8    End of Session Equipment Utilized During Treatment: Gait belt Activity Tolerance: Patient limited by lethargy Patient left: in bed;with call bell/phone within reach;with nursing/sitter in room Nurse Communication: Mobility status PT Visit Diagnosis: Unsteadiness on feet (R26.81);Other abnormalities of gait and mobility (R26.89);Muscle weakness (generalized) (M62.81);History of falling (Z91.81);Difficulty in walking, not elsewhere classified (R26.2)     Time: 9095-9072 PT Time Calculation (min) (ACUTE ONLY): 23 min  Charges:    $Therapeutic  Activity: 23-37 mins PT General Charges $$ ACUTE PT VISIT: 1 Visit                     Tori Kristilyn Coltrane PT, DPT 01/28/2024, 10:05 AM

## 2024-01-28 NOTE — Progress Notes (Signed)
" ° °  NAME:  Noah King, MRN:  [id number], DOB:  12-Jan-1946, LOS: 2 ADMISSION DATE:  01/25/2024, CONSULTATION DATE: 01/27/2024 REFERRING MD: GORMAN Pore MD, CHIEF COMPLAINT: Multilobar cavitary pneumonia  History of Present Illness:   79 year old with diagnosis of Stage IV (T1a, N2b, M1b) non-small cell lung cancer, adenocarcinoma with brain mets which is diagnosed with a brain biopsy after nondiagnostic bronchoscopy in August 2026.  Currently on chemotherapy presenting with generalized weakness, confusion.  Lung imaging shows bilateral lower lobe pneumonia with cavitation and PCCM has been consulted for help with management.  Currently on ceftriaxone  and doxycycline .  Pertinent  Medical History    has a past medical history of Allergy, Cataract, Dupuytren's contracture, GERD (gastroesophageal reflux disease), Hiatal hernia, Hypertension, and Polycythemia.   Significant Hospital Events: Including procedures, antibiotic start and stop dates in addition to other pertinent events   1/11 admit 1/13 PCCM consult  Interim History / Subjective:   Less confused.  No complaints today  Objective    Blood pressure (!) 123/59, pulse (!) 57, temperature 99.3 F (37.4 C), temperature source Oral, resp. rate 17, SpO2 92%.        Intake/Output Summary (Last 24 hours) at 01/28/2024 0949 Last data filed at 01/28/2024 0600 Gross per 24 hour  Intake 721.25 ml  Output 1000 ml  Net -278.75 ml   There were no vitals filed for this visit.  Examination: Awake, responsive Lungs are clear to auscultation Heart regular rate and rhythm Extremities with no edema  Lab/imaging reviewed CT chest with new bilateral lower lobe airspace consolidation with ground glass and cavitation.  Small right effusion.  Previously described lung nodules are obscured by consolidation.  Resolved problem list   Assessment and Plan  Cavitary pneumonia Stage IV lung adenocarcinoma with brain mets Bilateral lower  lobe pneumonia with cavitation, likely infectious in nature, as it developed over three months and was not present in previous scan on 12/09/2023.  Symptoms include fatigue and weakness. Differential diagnosis includes lung cancer, but the rapid onset suggests pneumonia as the primary cause. - Continue ceftriaxone , doxycycline  -Pleural effusion is too small to drain at present. -Will need follow-up CT to ensure resolution of pneumonia.  Signature:   Dodie Parisi MD [facility] See Amion for pager  If no response to pager , please call [phone] until 7pm After 7:00 pm call Elink  [phone] 01/28/2024, 9:49 AM         "

## 2024-01-28 NOTE — Progress Notes (Signed)
 Noah King   DOB:[birth year]   FM#:[id number]      CLINICAL SUMMARY:  Noah King is a 79 year old male patient who presented on 01/25/2024 with complaints of progressive weakness, poor attic tight, cough and confusion.  Oncologic history significant for lung cancer.  Medical oncology following.   ASSESSMENT & PLAN:  Non-small cell lung cancer, adenocarcinoma with brain mets, stage IV - Diagnosed August 2025 - He is status post left craniotomy and SRS. - Underwent concurrent chemoradiation with weekly carbo + paclitaxel  x 5 cycles. - Transitioned to carbo + pemetrexed  + pembrolizumab  which was started 01/12/2024. - Medical oncology/Dr. Sherrod following closely.   Pancytopenia -Leukopenia/neutropenia: WBC 1.5.  Recommend G-CSF 300 mg daily subcu until ANC greater than 1.5. - Anemia: Hemoglobin 8.7.  Recommend PRBC transfusion for hemoglobin <7.0.  No transfusional intervention required at this time - Thrombocytopenia: Mild, platelets 113K.  No transfusional intervention required. - Continue to monitor CBC with differential  Pneumonia Right pleural effusion, small - CT chest 01/26/2024 showed new bilateral lower lobe consolidation consistent with necrotizing pneumonia. - Continue IV antibiotics as ordered  Altered mental status Generalized weakness - Appears to be improving today.  Had conversation with patient and he was oriented x 3 and responded appropriately. - MRI brain done 12/24/2023 showed new 3 mm enhancing nodular lesion within the left frontal lobe. - Continue PT/OT - Follows with neuro oncology Dr. Buckley    Code Status Full   Subjective:  Patient seen awake and alert and oriented x 3 laying in bed.  He confirms he is in [facility] and the year.  Admits to feeling weak.  Denies headaches or acute pain.  No acute distress is noted.  Objective:   Intake/Output Summary (Last 24 hours) at 01/28/2024 0945 Last data filed at 01/28/2024 0600 Gross per 24  hour  Intake 721.25 ml  Output 1000 ml  Net -278.75 ml     PHYSICAL EXAMINATION: ECOG PERFORMANCE STATUS: 3 - Symptomatic, >50% confined to bed  Vitals:   01/27/24 1938 01/28/24 0538  BP: (!) 110/53 (!) 123/59  Pulse: 69 (!) 57  Resp: 17 17  Temp: 99.3 F (37.4 C) 99.3 F (37.4 C)  SpO2: 93% 92%   There were no vitals filed for this visit.  GENERAL: alert, no distress and comfortable + chronically ill-appearing SKIN: + Pale skin color, texture, turgor are normal, no rashes or significant lesions EYES: normal, conjunctiva are pink and non-injected, sclera clear OROPHARYNX: no exudate, no erythema and lips, buccal mucosa, and tongue normal  NECK: supple, thyroid  normal size, non-tender, without nodularity LYMPH: no palpable lymphadenopathy in the cervical, axillary or inguinal LUNGS: clear to auscultation and percussion with normal breathing effort HEART: regular rate & rhythm and no murmurs and no lower extremity edema ABDOMEN: abdomen soft, non-tender and normal bowel sounds MUSCULOSKELETAL: no cyanosis of digits and no clubbing  PSYCH: alert & oriented x 3 with fluent speech NEURO: no focal motor/sensory deficits   All questions were answered. The patient knows to call the clinic with any problems, questions or concerns.   I personally spent a total of 40 minutes minutes in the care of the patient today including preparing to see the patient, getting/reviewing separately obtained history, performing a medically appropriate exam/evaluation, placing orders, referring and communicating with other health care professionals, documenting clinical information in the EHR, communicating results, and coordinating care.    Olam PARAS Laquentin Loudermilk, NP 01/28/2024 9:45 AM    Labs Reviewed:  Lab Results  Component Value Date   WBC 1.5 (L) 01/28/2024   HGB 8.7 (L) 01/28/2024   HCT 24.6 (L) 01/28/2024   MCV 88.2 01/28/2024   PLT 113 (L) 01/28/2024   Recent Labs    12/09/23 0856  12/18/23 1032 01/12/24 1339 01/25/24 1155 01/26/24 0438 01/28/24 0522  NA 140 140 137 132* 132* 133*  K 4.6 4.4 4.0 3.8 4.1 3.2*  CL 104 103 100 94* 97* 99  CO2 27 29 26 27 26 25   GLUCOSE 117* 103* 106* 96 88 104*  BUN 10 10 11 14 14 8   CREATININE 0.85 0.79 0.75 0.82 0.76 0.64  CALCIUM  10.0 9.8 9.3 9.1 8.3* 7.9*  GFRNONAA >60  --  >60 >60 >60 >60  PROT 6.9 6.9 7.0  --   --   --   ALBUMIN  4.3 4.4 3.9  --   --   --   AST 23 20 18   --   --   --   ALT 23 21 12   --   --   --   ALKPHOS 85 69 92  --   --   --   BILITOT 1.2 1.4* 0.8  --   --   --     Studies Reviewed:   CT CHEST W CONTRAST Result Date: 01/27/2024 EXAM: CT CHEST WITH CONTRAST 01/26/2024 06:05:00 PM TECHNIQUE: CT of the chest was performed with the administration of intravenous contrast. Multiplanar reformatted images are provided for review. Automated exposure control, iterative reconstruction, and/or weight based adjustment of the mA/kV was utilized to reduce the radiation dose to as low as reasonably achievable. COMPARISON: 12/09/2023 CLINICAL HISTORY: Restaging non-small cell lung cancer. FINDINGS: MEDIASTINUM: Aortic atherosclerosis and coronary artery calcifications. No pericardial effusion. High right paratracheal lymph node is increased in size from the previous exam, measuring 9 mm. Previously 5 mm. The previous right hilar lymph node measures 1.4 cm, image 81/2. Previously 1.1 cm. LYMPH NODES: High-riding paratracheal lymph node is increased in size from the previous exam, measuring 9 mm. Previously 5 mm. The previous right hilar lymph node measures 1.4 cm, image 81/2. Previously 1.1 cm. LUNGS AND PLEURA: New airspace consolidation involving greater than 50% of the right lower lobe is noted. There are 2 peripheral areas of subpleural lucency in this area which may reflect areas of cavitation. Band-like area of consolidative change within the basilar right upper lobe also noted. Index nodules within the previous left lower  lobe are obscured by the new consolidative change. Within the lateral left lower lobe, there is a 4 mm lung nodule, image 114/6. Stable from previous exam. Right lower lobe lung nodules referenced on the previous exam are obscured by new consolidative change. Irregular area of scarring and architectural distortion within the medial right apex is unchanged, image 33/6. There is also new airspace consolidation and ground-glass attenuation involving the posterior and medial left lower lobe. There is a subpleural lucency in this area measuring 1.4 cm, image 103/6. This may reflect internal cavitation. There is a small right pleural effusion which is noted overlying the posterior right upper lobe, new from previous exam. SOFT TISSUES/BONES: No acute abnormality of the bones or soft tissues. UPPER ABDOMEN: The adrenal glands are normal size and morphology bilaterally. No nodule, thickening, or hemorrhage. No periadrenal stranding. IMPRESSION: 1. New bilateral lower lobe airspace consolidation with ground-glass change and suspected cavitation, with additional band-like consolidation in the basilar right upper lobe, most consistent with necrotizing pneumonia (consider aspiration and septic emboli), with recommended short-interval  follow-up chest CT in 6-8 weeks to document improvement and exclude an underlying malignant process. 2. Small right pleural effusion, new from prior exam. 3. Interval enlargement of a high-riding paratracheal lymph node and right hilar lymph node, suspicious for nodal metastatic disease versus reactive adenopathy. 4. Previously described lung nodules are partially obscured by new consolidation, limiting assessment; a 4 mm left lower lobe nodule is unchanged. Electronically signed by: Waddell Calk MD MD 01/27/2024 08:13 AM EST RP Workstation: [id number]   CT Head Wo Contrast Result Date: 01/25/2024 EXAM: CT HEAD WITHOUT CONTRAST 01/25/2024 11:37:33 AM TECHNIQUE: CT of the head was performed  without the administration of intravenous contrast. Automated exposure control, iterative reconstruction, and/or weight based adjustment of the mA/kV was utilized to reduce the radiation dose to as low as reasonably achievable. COMPARISON: MRI head 12/24/2023. CLINICAL HISTORY: weakness, confusion FINDINGS: BRAIN AND VENTRICLES: There is no evidence of an acute infarct, acute intracranial hemorrhage, midline shift, hydrocephalus, or extra-axial fluid collection. There is mild cerebral atrophy. A subcentimeter hyperdense focus in the medial left frontal lobe corresponds to susceptibility/chronic blood products on multiple prior MRI examinations. Scattered hypodensities in the cerebral white matter bilaterally are nonspecific but compatible with mild chronic small vessel ischemic disease. Calcified atherosclerosis at the skull base. ORBITS: Left cataract extraction. SINUSES: Persistent near complete opacification of the left frontal sinus. Clear mastoid air cells. SOFT TISSUES AND SKULL: Left parietal craniotomy. No acute soft tissue abnormality. No skull fracture. IMPRESSION: 1. No acute intracranial abnormality. 2. Mild chronic small vessel ischemic disease. Electronically signed by: Dasie Hamburg MD MD 01/25/2024 12:07 PM EST RP Workstation: [id number]   DG Chest 2 View Result Date: 01/25/2024 CLINICAL DATA:  Weakness, decreased appetite, dysuria. EXAM: CHEST - 2 VIEW COMPARISON:  08/11/2023 and CT chest 12/09/2023. FINDINGS: Trachea is midline. Heart size normal. New dense airspace consolidation in the right lower lobe with probable slight involvement of the right perihilar region. Tiny right pleural effusion. Biapical pleural thickening. Left lung is clear. IMPRESSION: 1. Right lower lobe pneumonia with a small right parapneumonic effusion. Followup PA and lateral chest X-ray is recommended in 3-4 weeks following trial of antibiotic therapy to ensure resolution and exclude underlying malignancy. 2. Probable mild  involvement of the right perihilar region. Electronically Signed   By: Newell Eke M.D.   On: 01/25/2024 11:50

## 2024-01-28 NOTE — TOC Progression Note (Signed)
 Transition of Care [facility]) - Progression Note    Patient Details  Name: Noah King MRN: [id number] Date of Birth: 1945/05/10  [facility] CM/SW Contact  NORMAN ASPEN, LCSW Phone Number: 01/28/2024, 1:24 PM  Clinical Narrative:     Have provided SNF bed offers to patient's daughter, Margit, who is reviewing and will get choice/ decision to me as soon as possible.  Will need to confirm bed as well as confirm that accepted facility can assist with transportation for patient to chemo infusion appointments.  Continue to follow.  Expected Discharge Plan: Skilled Nursing Facility Barriers to Discharge: Continued Medical Work up               Expected Discharge Plan and Services In-house Referral: Clinical Social Work   Post Acute Care Choice: Skilled Nursing Facility Living arrangements for the past 2 months: Single Family Home                 DME Arranged: N/A DME Agency: NA                   Social Drivers of Health (SDOH) Interventions SDOH Screenings   Food Insecurity: No Food Insecurity (01/25/2024)  Housing: Low Risk (01/25/2024)  Transportation Needs: No Transportation Needs (01/25/2024)  Utilities: Not At Risk (01/25/2024)  Alcohol Screen: Low Risk (11/01/2021)  Depression (PHQ2-9): Low Risk (01/12/2024)  Financial Resource Strain: Low Risk (11/01/2021)  Physical Activity: Insufficiently Active (11/27/2023)  Social Connections: Socially Isolated (01/25/2024)  Stress: No Stress Concern Present (11/27/2023)  Tobacco Use: Medium Risk (12/18/2023)    Readmission Risk Interventions     No data to display

## 2024-01-28 NOTE — Progress Notes (Addendum)
 " Progress Note   Patient: Noah King FMW:[id number] DOB: Jul 26, 1945 DOA: 01/25/2024     2 DOS: the patient was seen and examined on 01/28/2024   Brief hospital course: Noah King is a 79 y.o. male with medical history significant of lung cancer with brain mets s/p resection and palliative radiation, on chemotherapy, hypertension, hyperlipidemia presented to [facility] ED via EMS for evaluation of profound generalized weakness, poor appetite, mild confusion for the past 1-2 weeks.  Recent chemo treatment on 01/12/24.  Since that time, patient has become progressively more weak and has not been eating or drinking much of anything.  He has a cough that is apparently stable and chronic for over 20 years, has not seemed to worsen recently.  No fever, chills, rigors of sore throat.  No nausea vomiting. CT head without contrast no acute findings, mild chronic small vessel ischemic disease. Chest x-ray showed right lower lobe pneumonia with small right parapneumonic effusion. Patient started on Rocephin , doxycycline  in the ED with IV hydration admitted to [facility] service for further management evaluation.  Assessment and Plan: * Right lower lobe pneumonia Generalized weakness Anorexia Chest x-ray showing RLL PNA with small right parapneumonic effusion.  Patient does have non-small cell lung cancer involving the RLL. CT chest revealed necrotizing lesion. Pulmonary follow up appreciated - continue Unasyn , doxy.  SLP cleared him for diet. MRSA screen negative. Encourage incentive spirometry, out of bed. PT/OT follow up. Fall and aspiration precautions.  Malignant neoplasm of lower lobe of right lung (HCC) With mets to brain s/p craniotomy and resection of left parietal lesion, with new 3mm left front lobe lesion on MRI 12/23/24, currently on palliative radiation. Follows with Dr. Sherrod and Buckley.  Last chemo on 01/12/24.   He does have expressive aphasia, unsteady gait. Due to his decline  in mental status, I ordered MRI brain to assess mets. Palliative Care on board for goals of care discussion.  Essential hypertension BP better. Continue to hold home losartan  for now, resume when BP warrants.  GERD (gastroesophageal reflux disease) Continue PPI  Hyperlipidemia Continue statin  Pancytopenia- Possibly from chemotherapy. WBC 1.5, Hb 8.7, platelet 113 today. Filgrastim  per oncology team. Continue to trend CBC with diff.  Generalized weakness- Failure to thrive PT/ OT advised SNF placement RD evaluation.    Out of bed to chair. Incentive spirometry. Nursing supportive care. Fall, aspiration precautions. Diet:  Diet Orders (From admission, onward)     Start     Ordered   01/25/24 1356  Diet regular Room service appropriate? Yes; Fluid consistency: Thin  Diet effective now       Question Answer Comment  Room service appropriate? Yes   Fluid consistency: Thin      01/25/24 1356           DVT prophylaxis: enoxaparin  (LOVENOX ) injection 40 mg Start: 01/25/24 2200  Level of care: Med-Surg   Code Status: Full Code  Subjective: Patient is seen and examined today morning. He is more sleepy per RN today.  Did not eat breakfast. Feels weak. During my exam he is more awake able to answer me.  Physical Exam: Vitals:   01/27/24 1759 01/27/24 1938 01/28/24 0538 01/28/24 1340  BP: (!) 114/58 (!) 110/53 (!) 123/59 (!) 125/55  Pulse: 65 69 (!) 57 65  Resp:  17 17 16   Temp: 99.1 F (37.3 C) 99.3 F (37.4 C) 99.3 F (37.4 C) 99.8 F (37.7 C)  TempSrc: Oral Oral Oral   SpO2:  98% 93% 92% 97%    General - Elderly ill appearing Caucasian male, no apparent distress HEENT - PERRLA, EOMI, atraumatic head, non tender sinuses. Lung - Clear, basal rales, rhonchi, no wheezes. Heart - S1, S2 heard, no murmurs, rubs, trace pedal edema. Abdomen - Soft, non tender, bowel sounds good Neuro - Alert, awake and oriented, slow mentation, non focal exam. Skin - Warm and  dry.  Data Reviewed:      Latest Ref Rng & Units 01/28/2024    5:22 AM 01/26/2024    4:38 AM 01/25/2024   11:55 AM  CBC  WBC 4.0 - 10.5 K/uL 1.5  2.2  1.9   Hemoglobin 13.0 - 17.0 g/dL 8.7  8.9  89.2   Hematocrit 39.0 - 52.0 % 24.6  25.6  30.7   Platelets 150 - 400 K/uL 113  96  105       Latest Ref Rng & Units 01/28/2024    5:22 AM 01/26/2024    4:38 AM 01/25/2024   11:55 AM  BMP  Glucose 70 - 99 mg/dL 895  88  96   BUN 8 - 23 mg/dL 8  14  14    Creatinine 0.61 - 1.24 mg/dL 9.35  9.23  9.17   Sodium 135 - 145 mmol/L 133  132  132   Potassium 3.5 - 5.1 mmol/L 3.2  4.1  3.8   Chloride 98 - 111 mmol/L 99  97  94   CO2 22 - 32 mmol/L 25  26  27    Calcium  8.9 - 10.3 mg/dL 7.9  8.3  9.1    DG Swallowing Func-Speech Pathology Result Date: 01/28/2024 Table formatting from the original result was not included. Modified Barium Swallow Study Patient Details Name: Noah King MRN: [id number] Date of Birth: 1945-09-20 Today's Date: 01/28/2024 HPI/PMH: HPI: [name] is a 79 yo male who presented to [facility] on 01/25/2024 due to poor PO intake, mild confusion and generalized weakness. Pt found to have RLL pna, onset was rapid. (CXR 2view RLL pna). PMH includes but is not limited to lung ca with brain mets s/p resection and palliative radiation and on chemotherapy, HTN, HLD, HTN, hyperlipidemia and GERD. Pt was followed by SLP for cognitive tx during July/Aug admission. Clinical Impression: Clinical Impression: Pt presented with functional oropharyngeal swallowing with no penetration nor aspiration, despite being taxed with large volumes of thin liquids. There was mildly decreased mobility of the larynx and the tip of the epiglottis did not completely invert, however, airway closure was reliable. The decreased epiglottic inversion led to some trapping of thicker solids above the epiglottis.  There was no back flow of material from the esophagus.  The 13 mm pill lodged briefly lateral to the  epiglottis and again at the LES, but passed without incident. Recommend pt continue regular solids/thin liquids.  Called his daughter and we discussed results/recs, including benefit of intensive oral care to help keep infection at bay in case there are occasional incidents of aspiration now or in the future. She agreed with recs.  No SLP f/u is needed. Factors that may increase risk of adverse event in presence of aspiration [name] & Lianne 2021): Factors that may increase risk of adverse event in presence of aspiration [name] & Lianne 2021): Respiratory or GI disease; Poor general health and/or compromised immunity; Frail or deconditioned Recommendations/Plan: Swallowing Evaluation Recommendations Swallowing Evaluation Recommendations Recommendations: PO diet PO Diet Recommendation: Regular; Thin liquids (Level 0) Liquid Administration via: Straw; Cup Medication Administration: Whole  meds with liquid Supervision: Patient able to self-feed Oral care recommendations: Oral care QID (4x/day) Treatment Plan Treatment Plan Follow-up recommendations: No SLP follow up Recommendations Recommendations for follow up therapy are one component of a multi-disciplinary discharge planning process, led by the attending physician.  Recommendations may be updated based on patient status, additional functional criteria and insurance authorization. Assessment: Orofacial Exam: Orofacial Exam Oral Cavity: Oral Hygiene: WFL Oral Cavity - Dentition: Adequate natural dentition Orofacial Anatomy: WFL Oral Motor/Sensory Function: WFL Anatomy: Anatomy: WFL Boluses Administered: Boluses Administered Boluses Administered: Thin liquids (Level 0); Mildly thick liquids (Level 2, nectar thick); Puree; Solid  Oral Impairment Domain: Oral Impairment Domain Lip Closure: No labial escape Tongue control during bolus hold: Cohesive bolus between tongue to palatal seal Bolus preparation/mastication: Slow prolonged chewing/mashing with complete  recollection Bolus transport/lingual motion: Brisk tongue motion Oral residue: Complete oral clearance Location of oral residue : N/A Initiation of pharyngeal swallow : Posterior angle of the ramus  Pharyngeal Impairment Domain: Pharyngeal Impairment Domain Soft palate elevation: No bolus between soft palate (SP)/pharyngeal wall (PW) Laryngeal elevation: Partial superior movement of thyroid  cartilage/partial approximation of arytenoids to epiglottic petiole Anterior hyoid excursion: Partial anterior movement Epiglottic movement: Partial inversion Laryngeal vestibule closure: Complete, no air/contrast in laryngeal vestibule Pharyngeal stripping wave : Present - complete Pharyngeal contraction (A/P view only): N/A Pharyngoesophageal segment opening: Partial distention/partial duration, partial obstruction of flow Tongue base retraction: Trace column of contrast or air between tongue base and PPW Pharyngeal residue: Collection of residue within or on pharyngeal structures Location of pharyngeal residue: Valleculae  Esophageal Impairment Domain: Esophageal Impairment Domain Esophageal clearance upright position: Complete clearance, esophageal coating Pill: Pill Consistency administered: Thin liquids (Level 0) Thin liquids (Level 0): [facility] Penetration/Aspiration Scale Score: Penetration/Aspiration Scale Score 1.  Material does not enter airway: Thin liquids (Level 0); Mildly thick liquids (Level 2, nectar thick); Puree; Solid; Pill Compensatory Strategies: Compensatory Strategies Compensatory strategies: No   General Information: Caregiver present: No  Diet Prior to this Study: Regular; Thin liquids (Level 0)   Temperature : Normal   Respiratory Status: WFL   Supplemental O2: None (Room air)   History of Recent Intubation: No  Behavior/Cognition: Alert; Cooperative; Pleasant mood Self-Feeding Abilities: Able to self-feed Baseline vocal quality/speech: Normal Volitional Cough: Able to elicit Volitional Swallow: Able to  elicit Exam Limitations: No limitations Goal Planning: No data recorded No data recorded No data recorded No data recorded Consulted and agree with results and recommendations: Patient Pain: Pain Assessment Pain Assessment: No/denies pain Faces Pain Scale: 0 End of Session: Start Time:SLP Start Time (ACUTE ONLY): 1223 Stop Time: SLP Stop Time (ACUTE ONLY): 1236 Time Calculation:SLP Time Calculation (min) (ACUTE ONLY): 13 min Charges: SLP Evaluations $ SLP Speech Visit: 1 Visit SLP Evaluations $BSS Swallow: 1 Procedure $MBS Swallow: 1 Procedure SLP visit diagnosis: SLP Visit Diagnosis: Dysphagia, unspecified (R13.10) Past Medical History: Past Medical History: Diagnosis Date  Allergy   Cataract   left  extraction- right eye forming  Dupuytren's contracture   GERD (gastroesophageal reflux disease)   Hiatal hernia   past hx-  suspect at one time  Hypertension   Polycythemia  Past Surgical History: Past Surgical History: Procedure Laterality Date  APPLICATION OF CRANIAL NAVIGATION Left 08/21/2023  Procedure: COMPUTER-ASSISTED NAVIGATION, FOR CRANIAL PROCEDURE;  Surgeon: Debby Dorn MATSU, MD;  Location: [facility] OR;  Service: Neurosurgery;  Laterality: Left;  cataract extraction left eye    COLONOSCOPY    CRANIOTOMY Left 08/21/2023  Procedure: CRANIOTOMY TUMOR EXCISION;  Surgeon: Debby Dorn MATSU, MD;  Location: [facility] OR;  Service: Neurosurgery;  Laterality: Left;  LT CRANI FOR TUMOR RESECTION  DUPUYTREN CONTRACTURE RELEASE Left   HERNIA REPAIR  2024  POLYPECTOMY    rt knee arthroscopic    VIDEO BRONCHOSCOPY WITH ENDOBRONCHIAL ULTRASOUND Right 08/13/2023  Procedure: BRONCHOSCOPY, WITH EBUS;  Surgeon: Jude Harden GAILS, MD;  Location: [facility] ENDOSCOPY;  Service: Cardiopulmonary;  Laterality: Right; Vona Palma Laurice 01/28/2024, 1:02 PM Palma L. Vona, MA CCC/SLP Clinical Specialist - Acute Care SLP Acute Rehabilitation Services Office number [phone]  CT CHEST W CONTRAST Result Date: 01/27/2024 EXAM: CT CHEST WITH  CONTRAST 01/26/2024 06:05:00 PM TECHNIQUE: CT of the chest was performed with the administration of intravenous contrast. Multiplanar reformatted images are provided for review. Automated exposure control, iterative reconstruction, and/or weight based adjustment of the mA/kV was utilized to reduce the radiation dose to as low as reasonably achievable. COMPARISON: 12/09/2023 CLINICAL HISTORY: Restaging non-small cell lung cancer. FINDINGS: MEDIASTINUM: Aortic atherosclerosis and coronary artery calcifications. No pericardial effusion. High right paratracheal lymph node is increased in size from the previous exam, measuring 9 mm. Previously 5 mm. The previous right hilar lymph node measures 1.4 cm, image 81/2. Previously 1.1 cm. LYMPH NODES: High-riding paratracheal lymph node is increased in size from the previous exam, measuring 9 mm. Previously 5 mm. The previous right hilar lymph node measures 1.4 cm, image 81/2. Previously 1.1 cm. LUNGS AND PLEURA: New airspace consolidation involving greater than 50% of the right lower lobe is noted. There are 2 peripheral areas of subpleural lucency in this area which may reflect areas of cavitation. Band-like area of consolidative change within the basilar right upper lobe also noted. Index nodules within the previous left lower lobe are obscured by the new consolidative change. Within the lateral left lower lobe, there is a 4 mm lung nodule, image 114/6. Stable from previous exam. Right lower lobe lung nodules referenced on the previous exam are obscured by new consolidative change. Irregular area of scarring and architectural distortion within the medial right apex is unchanged, image 33/6. There is also new airspace consolidation and ground-glass attenuation involving the posterior and medial left lower lobe. There is a subpleural lucency in this area measuring 1.4 cm, image 103/6. This may reflect internal cavitation. There is a small right pleural effusion which is noted  overlying the posterior right upper lobe, new from previous exam. SOFT TISSUES/BONES: No acute abnormality of the bones or soft tissues. UPPER ABDOMEN: The adrenal glands are normal size and morphology bilaterally. No nodule, thickening, or hemorrhage. No periadrenal stranding. IMPRESSION: 1. New bilateral lower lobe airspace consolidation with ground-glass change and suspected cavitation, with additional band-like consolidation in the basilar right upper lobe, most consistent with necrotizing pneumonia (consider aspiration and septic emboli), with recommended short-interval follow-up chest CT in 6-8 weeks to document improvement and exclude an underlying malignant process. 2. Small right pleural effusion, new from prior exam. 3. Interval enlargement of a high-riding paratracheal lymph node and right hilar lymph node, suspicious for nodal metastatic disease versus reactive adenopathy. 4. Previously described lung nodules are partially obscured by new consolidation, limiting assessment; a 4 mm left lower lobe nodule is unchanged. Electronically signed by: Waddell Calk MD MD 01/27/2024 08:13 AM EST RP Workstation: [id number]    Family Communication: Discussed with patient's daughter over phone. She understand and agree. All questions answered.    Disposition: Status is: Inpatient Remains inpatient appropriate because: IV antibiotics, pneumonia versus malignancy.  Pulmonary/ oncology follow-up  Planned Discharge Destination: Skilled nursing facility     Time spent: 45 minutes  Author: Concepcion Riser, MD 01/28/2024 4:20 PM Secure chat 7am to 7pm For on call review [email or web address].    "

## 2024-01-28 NOTE — Evaluation (Addendum)
 Modified Barium Swallow Study  Patient Details  Name: Noah King MRN: [id number] Date of Birth: 08-30-1945  Today's Date: 01/28/2024  Modified Barium Swallow completed.  Full report located under Chart Review in the Imaging Section.  History of Present Illness Noah King is a 79 yo male who presented to [facility] on 01/25/2024 due to poor PO intake, mild confusion and generalized weakness. Pt found to have RLL pna, onset was rapid. (CXR 2view RLL pna). PMH includes but is not limited to lung ca with brain mets s/p resection and palliative radiation and on chemotherapy, HTN, HLD, HTN, hyperlipidemia and GERD. Pt was followed by SLP for cognitive tx during July/Aug admission.   Clinical Impression Pt presented with functional oropharyngeal swallowing with no penetration nor aspiration, despite being taxed with large volumes of thin liquids. There was mildly decreased mobility of the larynx and the tip of the epiglottis did not completely invert, however, airway closure was reliable. The decreased epiglottic inversion led to some trapping of thicker solids above the epiglottis.  There was no back flow of material from the esophagus.  The 13 mm pill lodged briefly lateral to the epiglottis and again at the LES, but passed without incident. Recommend pt continue regular solids/thin liquids. Called his daughter and we discussed results/recs, including benefit of intensive oral care to help keep infection at bay in case there are occasional incidents of aspiration now or in the future. She agreed with recs. No SLP f/u is needed.   Factors that may increase risk of adverse event in presence of aspiration Noah King & Noah King 2021): Respiratory or GI disease;Poor general health and/or compromised immunity;Frail or deconditioned  Swallow Evaluation Recommendations Recommendations: PO diet PO Diet Recommendation: Regular;Thin liquids (Level 0) Liquid Administration via: Straw;Cup Medication  Administration: Whole meds with liquid Supervision: Patient able to self-feed Oral care recommendations: Oral care QID (4x/day)      Noah King 01/28/2024,1:02 PM  Noah King L. Vona, MA CCC/SLP Clinical Specialist - Acute Care SLP Acute Rehabilitation Services Office number [phone]

## 2024-01-28 NOTE — Plan of Care (Signed)

## 2024-01-29 DIAGNOSIS — D61810 Antineoplastic chemotherapy induced pancytopenia: Secondary | ICD-10-CM | POA: Diagnosis not present

## 2024-01-29 DIAGNOSIS — J90 Pleural effusion, not elsewhere classified: Secondary | ICD-10-CM | POA: Diagnosis not present

## 2024-01-29 DIAGNOSIS — C3431 Malignant neoplasm of lower lobe, right bronchus or lung: Secondary | ICD-10-CM | POA: Diagnosis not present

## 2024-01-29 DIAGNOSIS — D709 Neutropenia, unspecified: Secondary | ICD-10-CM | POA: Diagnosis not present

## 2024-01-29 DIAGNOSIS — J189 Pneumonia, unspecified organism: Secondary | ICD-10-CM | POA: Diagnosis not present

## 2024-01-29 DIAGNOSIS — C7931 Secondary malignant neoplasm of brain: Secondary | ICD-10-CM | POA: Diagnosis not present

## 2024-01-29 DIAGNOSIS — E43 Unspecified severe protein-calorie malnutrition: Secondary | ICD-10-CM

## 2024-01-29 DIAGNOSIS — G9340 Encephalopathy, unspecified: Secondary | ICD-10-CM

## 2024-01-29 DIAGNOSIS — C3432 Malignant neoplasm of lower lobe, left bronchus or lung: Secondary | ICD-10-CM | POA: Diagnosis not present

## 2024-01-29 LAB — CBC
HCT: 24.5 % — ABNORMAL LOW (ref 39.0–52.0)
Hemoglobin: 8.7 g/dL — ABNORMAL LOW (ref 13.0–17.0)
MCH: 30.9 pg (ref 26.0–34.0)
MCHC: 35.5 g/dL (ref 30.0–36.0)
MCV: 86.9 fL (ref 80.0–100.0)
Platelets: 139 K/uL — ABNORMAL LOW (ref 150–400)
RBC: 2.82 MIL/uL — ABNORMAL LOW (ref 4.22–5.81)
RDW: 12.6 % (ref 11.5–15.5)
WBC: 6.1 K/uL (ref 4.0–10.5)
nRBC: 0 % (ref 0.0–0.2)

## 2024-01-29 LAB — BASIC METABOLIC PANEL WITH GFR
Anion gap: 8 (ref 5–15)
BUN: 6 mg/dL — ABNORMAL LOW (ref 8–23)
CO2: 23 mmol/L (ref 22–32)
Calcium: 7.9 mg/dL — ABNORMAL LOW (ref 8.9–10.3)
Chloride: 101 mmol/L (ref 98–111)
Creatinine, Ser: 0.58 mg/dL — ABNORMAL LOW (ref 0.61–1.24)
GFR, Estimated: >60 mL/min
Glucose, Bld: 96 mg/dL (ref 70–99)
Potassium: 3 mmol/L — ABNORMAL LOW (ref 3.5–5.1)
Sodium: 132 mmol/L — ABNORMAL LOW (ref 135–145)

## 2024-01-29 LAB — PATHOLOGIST SMEAR REVIEW: Path Review: NEGATIVE

## 2024-01-29 MED ORDER — POTASSIUM CHLORIDE 20 MEQ PO PACK
40.0000 meq | PACK | Freq: Two times a day (BID) | ORAL | Status: AC
  Administered 2024-01-29 – 2024-01-30 (×4): 40 meq via ORAL
  Filled 2024-01-29 (×4): qty 2

## 2024-01-29 MED ORDER — ENSURE PLUS HIGH PROTEIN PO LIQD
237.0000 mL | Freq: Two times a day (BID) | ORAL | Status: DC
  Administered 2024-01-29 – 2024-02-04 (×3): 237 mL via ORAL

## 2024-01-29 NOTE — Plan of Care (Signed)
" °  Problem: Education: Goal: Knowledge of General Education information will improve Description: Including pain rating scale, medication(s)/side effects and non-pharmacologic comfort measures Outcome: Progressing   Problem: Clinical Measurements: Goal: Ability to maintain clinical measurements within normal limits will improve Outcome: Progressing Goal: Diagnostic test results will improve Outcome: Progressing Goal: Respiratory complications will improve Outcome: Progressing Goal: Cardiovascular complication will be avoided Outcome: Progressing   Problem: Nutrition: Goal: Adequate nutrition will be maintained Outcome: Progressing   Problem: Coping: Goal: Level of anxiety will decrease Outcome: Progressing   Problem: Elimination: Goal: Will not experience complications related to bowel motility Outcome: Progressing Goal: Will not experience complications related to urinary retention Outcome: Progressing   Problem: Pain Managment: Goal: General experience of comfort will improve and/or be controlled Outcome: Progressing   Problem: Safety: Goal: Ability to remain free from injury will improve Outcome: Progressing   Problem: Skin Integrity: Goal: Risk for impaired skin integrity will decrease Outcome: Progressing   Problem: Activity: Goal: Ability to tolerate increased activity will improve Outcome: Progressing   Problem: Clinical Measurements: Goal: Ability to maintain a body temperature in the normal range will improve Outcome: Progressing   Problem: Respiratory: Goal: Ability to maintain adequate ventilation will improve Outcome: Progressing Goal: Ability to maintain a clear airway will improve Outcome: Progressing   "

## 2024-01-29 NOTE — Progress Notes (Signed)
" ° °  NAME:  Noah King, MRN:  [id number], DOB:  [birth year], LOS: 3 ADMISSION DATE:  01/25/2024, CONSULTATION DATE: 01/27/2024 REFERRING MD: GORMAN Pore MD, CHIEF COMPLAINT: Multilobar cavitary pneumonia  History of Present Illness:   79 year old with diagnosis of Stage IV (T1a, N2b, M1b) non-small cell lung cancer, adenocarcinoma with brain mets which is diagnosed with a brain biopsy after nondiagnostic bronchoscopy in August 2026.  Currently on chemotherapy presenting with generalized weakness, confusion.  Lung imaging shows bilateral lower lobe pneumonia with cavitation and PCCM has been consulted for help with management.  Currently on ceftriaxone  and doxycycline .  Pertinent  Medical History    has a past medical history of Allergy, Cataract, Dupuytren's contracture, GERD (gastroesophageal reflux disease), Hiatal hernia, Hypertension, and Polycythemia.   Significant Hospital Events: Including procedures, antibiotic start and stop dates in addition to other pertinent events   1/11 admit 1/13 PCCM consult  Interim History / Subjective:   Breathing is better.  No new complaint  Objective    Blood pressure (!) 114/54, pulse 62, temperature (!) 97.3 F (36.3 C), temperature source Oral, resp. rate 16, SpO2 93%.        Intake/Output Summary (Last 24 hours) at 01/29/2024 1142 Last data filed at 01/29/2024 1000 Gross per 24 hour  Intake 0 ml  Output 800 ml  Net -800 ml   There were no vitals filed for this visit.  Examination: Awake, responsive Lungs are clear to auscultation Heart regular rate and rhythm Extremities with no edema  Lab/imaging reviewed CT chest with new bilateral lower lobe airspace consolidation with ground glass and cavitation.  Small right effusion.  Previously described lung nodules are obscured by consolidation.  Resolved problem list   Assessment and Plan  Cavitary pneumonia Stage IV lung adenocarcinoma with brain mets Bilateral lower lobe  pneumonia with cavitation, likely infectious in nature, as it developed over three months and was not present in previous scan on 12/09/2023.  Symptoms include fatigue and weakness. Differential diagnosis includes lung cancer, but the rapid onset suggests pneumonia as the primary cause. - Continue ceftriaxone , doxycycline .  Can be discharged on Augmentin  for total 14 days of therapy -Pleural effusion is too small to drain at present. -Will need follow-up CT to ensure resolution of pneumonia which can be done at oncology clinic as he is following with them for cancer treatment.  PCCM will be available as needed.  Please call with questions.  Signature:   Abimael Zeiter MD [facility] See Amion for pager  If no response to pager , please call [phone] until 7pm After 7:00 pm call Elink  [phone] 01/29/2024, 11:42 AM         "

## 2024-01-29 NOTE — Progress Notes (Signed)
 Occupational Therapy Treatment Patient Details Name: Noah King MRN: [id number] DOB: 11-28-1945 Today's Date: 01/29/2024   History of present illness 79 yo male presents to therapy following hospital admission on 01/25/2024 due to poor PO intake, mild confusion and generalized weakness. Pt found to have PNA. Pt PMH includes but is not limited to lung ca with brain mets s/p resection and palliative radiation and on chemotherapy, HTN, HLD, HTN, hyperlipidemia and GERD.   OT comments  Patient seen for skilled OT session and patient open to all therapy presented. Progressed to EOB then STS with RW when large loose BM incontinence discovered and returned bed level for hygiene then grooming. See below for current status. Patient will benefit from continued inpatient follow up therapy, <3 hours/day. Patient requires continued Acute care hospital level OT services to progress safety and functional performance and allow for discharge.        If plan is discharge home, recommend the following:  Two people to help with walking and/or transfers;Two people to help with bathing/dressing/bathroom;Direct supervision/assist for medications management;Direct supervision/assist for financial management;Assist for transportation;Supervision due to cognitive status   Equipment Recommendations  None recommended by OT       Precautions / Restrictions Precautions Precautions: Fall Recall of Precautions/Restrictions: Impaired Restrictions Weight Bearing Restrictions Per Provider Order: No       Mobility Bed Mobility Overal bed mobility: Needs Assistance Bed Mobility: Supine to Sit, Sit to Supine, Rolling Rolling: Min assist, Mod assist, Used rails   Supine to sit: Min assist, Mod assist, HOB elevated, Used rails Sit to supine: Min assist, Mod assist, HOB elevated, Used rails   General bed mobility comments: cues for all steps of mobility    Transfers Overall transfer level: Needs  assistance Equipment used: Rolling walker (2 wheels) Transfers: Sit to/from Stand Sit to Stand: Mod assist, From elevated surface           General transfer comment: returend to bed level d/t bowel incontinence     Balance Overall balance assessment: History of Falls, Needs assistance Sitting-balance support: Feet supported, Bilateral upper extremity supported Sitting balance-Leahy Scale: Fair     Standing balance support: Bilateral upper extremity supported, During functional activity, Reliant on assistive device for balance Standing balance-Leahy Scale: Poor Standing balance comment: posterior lean                           ADL either performed or assessed with clinical judgement   ADL Overall ADL's : Needs assistance/impaired     Grooming: Wash/dry hands;Wash/dry face;Oral care;Contact guard assist;Sitting;Cueing for sequencing Grooming Details (indicate cue type and reason): EOB     Lower Body Bathing: Maximal assistance;Sitting/lateral leans;Bed level Lower Body Bathing Details (indicate cue type and reason): EOB when large loose BM discovered             Toileting- Clothing Manipulation and Hygiene: Total assistance;Bed level Toileting - Clothing Manipulation Details (indicate cue type and reason): returned bed level to complete BM clean up and hygeine     Functional mobility during ADLs: Moderate assistance;Rolling walker (2 wheels) General ADL Comments: unaware loose BM    Extremity/Trunk Assessment Upper Extremity Assessment Upper Extremity Assessment: Right hand dominant;Generalized weakness   Lower Extremity Assessment Lower Extremity Assessment: Defer to PT evaluation                 Communication Communication Communication: Impaired Factors Affecting Communication: Difficulty expressing self   Cognition Arousal: Alert Behavior  During Therapy: Flat affect Cognition: Cognition impaired   Orientation impairments: Time, Situation  (now oriented to place) Awareness: Intellectual awareness impaired, Online awareness impaired Memory impairment (select all impairments): Short-term memory, Working memory, Non-declarative long-term memory Attention impairment (select first level of impairment): Sustained attention Executive functioning impairment (select all impairments): Problem solving, Reasoning, Sequencing, Organization, Initiation OT - Cognition Comments: slow processing, decreased insight into situation/deficits, difficulties  and was unaware he had been incontinent of large loose bowels in bed                 Following commands: Impaired Following commands impaired: Follows one step commands inconsistently, Follows one step commands with increased time      Cueing   Cueing Techniques: Verbal cues, Gestural cues, Tactile cues, Visual cues        General Comments no skin issues, fatigues easily    Pertinent Vitals/ Pain       Pain Assessment Pain Assessment: No/denies pain   Frequency  Min 2X/week        Progress Toward Goals  OT Goals(current goals can now be found in the care plan section)  Progress towards OT goals: Progressing toward goals  Acute Rehab OT Goals OT Goal Formulation: With patient Time For Goal Achievement: 02/09/24 Potential to Achieve Goals: Fair ADL Goals Pt Will Perform Upper Body Dressing: sitting;with modified independence Pt Will Perform Lower Body Dressing: with modified independence;sitting/lateral leans;sit to/from stand Pt Will Transfer to Toilet: ambulating;bedside commode;grab bars;with mod assist Pt Will Perform Toileting - Clothing Manipulation and hygiene: with min assist;sitting/lateral leans;sit to/from stand  Plan      AM-PAC OT 6 Clicks Daily Activity     Outcome Measure   Help from another person eating meals?: A Little Help from another person taking care of personal grooming?: A Little Help from another person toileting, which includes using  toliet, bedpan, or urinal?: Total Help from another person bathing (including washing, rinsing, drying)?: A Lot Help from another person to put on and taking off regular upper body clothing?: A Lot Help from another person to put on and taking off regular lower body clothing?: Total 6 Click Score: 12    End of Session Equipment Utilized During Treatment: Gait belt;Rolling walker (2 wheels)  OT Visit Diagnosis: Unsteadiness on feet (R26.81);Other abnormalities of gait and mobility (R26.89);Muscle weakness (generalized) (M62.81);History of falling (Z91.81);Other symptoms and signs involving cognitive function;Cognitive communication deficit (R41.841) Symptoms and signs involving cognitive functions: Other cerebrovascular disease   Activity Tolerance Patient tolerated treatment well   Patient Left in bed;with bed alarm set;with call bell/phone within reach   Nurse Communication Mobility status;Other (comment) (BM data entered in Flowsheets)        Time: 1520-1550 OT Time Calculation (min): 30 min  Charges: OT General Charges $OT Visit: 1 Visit OT Treatments $Self Care/Home Management : 8-22 mins $Therapeutic Activity: 8-22 mins  Ortencia Askari OT/L Acute Rehabilitation Department  [phone]  01/29/2024, 5:07 PM

## 2024-01-29 NOTE — Progress Notes (Signed)
 Initial Nutrition Assessment  DOCUMENTATION CODES:   Severe malnutrition in context of chronic illness  INTERVENTION:  - Regular diet.   - Ensure Plus High Protein po BID between meals, each supplement provides 350 kcal and 20 grams of protein. - Boost Breeze po TID with meals, each supplement provides 250 kcal and 9 grams of protein - Magic cup BID with meals, each supplement provides 290 kcal and 9 grams of protein  - Encourage intake at all meals and of supplements.   - Monitor weight trends.    - No weight taken this admission, please obtain.  NUTRITION DIAGNOSIS:   Severe Malnutrition related to chronic illness (lung cancer with brain mets) as evidenced by severe fat depletion, severe muscle depletion.  GOAL:   Patient will meet greater than or equal to 90% of their needs  MONITOR:   PO intake, Supplement acceptance, Weight trends  REASON FOR ASSESSMENT:   Consult Assessment of nutrition requirement/status  ASSESSMENT:   79 y.o. male with PMH significant of lung cancer with brain mets s/p resection and palliative radiation, currently on chemotherapy, HTN, HLD who presented for evaluation of profound generalized weakness, poor appetite, mild confusion for the past 1-2 weeks.  Patient started chemotherapy 12/29 and since that time has become progressively more weak and not taking in much oral intake. Admitted for right lower lobe PNA.   Patient in bed at time of visit, lunch tray in front of him mostly untouched.   Patient noted to be somewhat confused on presentation but more awake and alert today. RN confirms his mentation is better today. Answered all questions appropriately.   He reports weight loss since around June when he had brain surgery. However, shares his weight loss has been worse over the past ~1 months.  Per EMR, patient had a craniotomy in August. Per EMR, weight dropped significantly during the month of August however suspect this weight loss  occurred over a larger time period. Outpatient cancer center RD notes state patient endorses it was over a larger time period as well.  However, weight mostly stable from August to December. Patient was weighed at 153# on 12/16 and dropped to 147# by 12/29. This is a 6# or 4% weight loss in 2 weeks, which is significant for the time frame. Suspect patient has lost further weight however there was no weight taken on admit so last weight is the 12/29 weight. Ordered a new weight to be taken today.  Patient reports eating fairly well until December when he started treatment and his appetite declined significantly.  He admits his current intake remains poor and so does his appetite. Thankfully, he has been receiving [employer] and enjoys it. Agreeable to try Ensure and Magic Cup as well to support intake while eating poor.   Medications reviewed and include: 1mg  folic acid , Protonix   Labs reviewed:  Na 132 K+ 3.0  NUTRITION - FOCUSED PHYSICAL EXAM:  Flowsheet Row Most Recent Value  Orbital Region Mild depletion  Upper Arm Region Severe depletion  Thoracic and Lumbar Region Moderate depletion  Buccal Region Severe depletion  Temple Region Severe depletion  Clavicle Bone Region Mild depletion  Clavicle and Acromion Bone Region Moderate depletion  Scapular Bone Region Unable to assess  Dorsal Hand Moderate depletion  Patellar Region Moderate depletion  Anterior Thigh Region Moderate depletion  Posterior Calf Region Severe depletion  Edema (RD Assessment) None  Hair Reviewed  Eyes Reviewed  Mouth Reviewed  Skin Reviewed  Nails Reviewed  Diet Order:   Diet Order             Diet regular Room service appropriate? Yes; Fluid consistency: Thin  Diet effective now                   EDUCATION NEEDS:  Education needs have been addressed  Skin:  Skin Assessment: Reviewed RN Assessment  Last BM:  1/15 - type 6  Height:  Ht Readings from Last 1 Encounters:  01/01/24 5'  10 (1.778 m)   Weight:  Wt Readings from Last 1 Encounters:  01/12/24 67 kg   Ideal Body Weight:  75.45 kg  BMI:  There is no height or weight on file to calculate BMI.  Estimated Nutritional Needs:  Kcal:  2000-2350 kcals Protein:  95-110 grams Fluid:  >/= 2L    Trude Ned RD, LDN Contact via Secure Chat.

## 2024-01-29 NOTE — Progress Notes (Signed)
 " Daily Progress Note   Patient Name: Noah King       Date: 01/29/2024 DOB: 1945-11-23  Age: 79 y.o. MRN#: [id number] Attending Physician: Darci Pore, MD Primary Care Physician: Merna Huxley, NP Admit Date: 01/25/2024 Length of Stay: 3 days  Reason for Follow-up: Establishing goals of care  Subjective:   CC: Patient acknowledges he feels  tongue twisted and is hoping he can improve with time.  Following up regarding goals of care.  Reviewed hospitalist documentation from 01/28/2024 noting brain MRI has been ordered due to change in cognition.  Also reviewed SLP note from that day and patient passed swallow study and is now on diet.  Reviewed pulmonology note from today noting that patient's breathing has been improving so plan to continue 14 days of antibiotics and then can follow-up at the oncology clinic as an outpatient for repeat CT imaging in the future to monitor improvement of pneumonia. Upon review of EMR, MRI imaging has not been obtained as of yet.  Presented to bedside to see patient.  No visitors present at bedside.  Patient laying comfortably in bed.  Introduced myself as a member of the palliative medicine team again.  Inquired about patient's symptom burden today.  Patient noted his main concern is that he is feeling tongue twisted when it comes to speaking.  This is what patient has been going through for multiple days along with lethargy and confusion.  Noted plan to get MRI for further workup today.  Patient is hopeful that with time and medical care he can get things lined up again for when he goes home.  Spent time providing emotional support via active listening.  Noted palliative medicine team will continue to follow along with patient's medical journey.  Objective:   Vital Signs:  BP (!) 114/54 (BP Location: Right Arm)   Pulse 62   Temp (!) 97.3 F (36.3 C) (Oral)   Resp 16   SpO2 93%   Physical Exam: General: NAD, awake, chronically  ill-appearing, more interactive though still confused at times Cardiovascular: RRR Respiratory: no increased work of breathing noted, not in respiratory distress Abdomen: not distended   Assessment & Plan:   Assessment: Patient is a 79 year old male with a past medical history of metastatic lung cancer with mets to the brain status postresection and palliative radiation on chemotherapy, hypertension, and hyperlipidemia who was admitted on 11/14/2024 after noted generalized weakness, poor appetite, and mild confusion.  Patient's most recent chemotherapy was on 01/12/2024.  Since admission, patient receiving management of cavitary pneumonia.  Pulmonology has been following for recommendations.  Palliative medicine team consulted to assist with goals of care.  Recommendations/Plan: # Complex medical decision making/goals of care:  - Patient still dealing with confusion which is a change in mentation that has been present since last Thursday.  Daughter has already expressed that she is hopeful for continued aggressive medical interventions so that patient would be able to return to his prior baseline.  At this time awaiting MRI for further workup to make sure there is no further metastatic disease causing issues with mentation.  Palliative medicine team continuing to follow along with patient's medical journey.  -  Code Status: Full Code  # Psychosocial Support:  - Daughter: Noah King who is patient's only child  # Discharge Planning: To Be Determined  Thank you for allowing the palliative care team to participate in the care Noah King.  Tinnie Radar, DO Palliative Care Provider PMT # [phone]  If patient remains symptomatic despite maximum doses, please call PMT at [phone] between 0700 and 1900. Outside of these hours, please call attending, as PMT does not have night coverage.  Personally spent 25 minutes in patient care including chart review (progress/consult  notes, vital signs), medically appropraite exam, education to patient, documenting clinical information, medication review and management, and coordination of care.  "

## 2024-01-29 NOTE — TOC Progression Note (Signed)
 Transition of Care [facility]) - Progression Note    Patient Details  Name: Noah King MRN: [id number] Date of Birth: Oct 24, 1945  Transition of Care [facility]) CM/SW Contact  NORMAN ASPEN, LCSW Phone Number: 01/29/2024, 2:51 PM  Clinical Narrative:     Contacted admission liaison for the two facilities who had offered SNF beds and told that the beds are no longer available.  She will alert me as soon as available beds open.  Have widened SNF bed search to all surrounding areas as well, but unfortunately, pt's insurance has a very limited network of SNF options.   Have updated daughter and MD.  Daughter also trying to work out with family a way to possibly bring pt to her home in [place].  For now, will stick with plan for SNF.  Expected Discharge Plan: Skilled Nursing Facility Barriers to Discharge: Continued Medical Work up               Expected Discharge Plan and Services In-house Referral: Clinical Social Work   Post Acute Care Choice: Skilled Nursing Facility Living arrangements for the past 2 months: Single Family Home                 DME Arranged: N/A DME Agency: NA                   Social Drivers of Health (SDOH) Interventions SDOH Screenings   Food Insecurity: No Food Insecurity (01/25/2024)  Housing: Low Risk (01/25/2024)  Transportation Needs: No Transportation Needs (01/25/2024)  Utilities: Not At Risk (01/25/2024)  Alcohol Screen: Low Risk (11/01/2021)  Depression (PHQ2-9): Low Risk (01/12/2024)  Financial Resource Strain: Low Risk (11/01/2021)  Physical Activity: Insufficiently Active (11/27/2023)  Social Connections: Socially Isolated (01/25/2024)  Stress: No Stress Concern Present (11/27/2023)  Tobacco Use: Medium Risk (12/18/2023)    Readmission Risk Interventions     No data to display

## 2024-01-29 NOTE — Progress Notes (Addendum)
 " Progress Note   Patient: Noah King FMW:[id number] DOB: Oct 12, 1945 DOA: 01/25/2024     3 DOS: the patient was seen and examined on 01/29/2024   Brief hospital course: Noah King is a 79 y.o. male with medical history significant of lung cancer with brain mets s/p resection and palliative radiation, on chemotherapy, hypertension, hyperlipidemia presented to [facility] ED via EMS for evaluation of profound generalized weakness, poor appetite, mild confusion for the past 1-2 weeks.  Recent chemo treatment on 01/12/24.  Since that time, patient has become progressively more weak and has not been eating or drinking much of anything.  He has a cough that is apparently stable and chronic for over 20 years, has not seemed to worsen recently.  No fever, chills, rigors of sore throat.  No nausea vomiting. CT head without contrast no acute findings, mild chronic small vessel ischemic disease. Chest x-ray showed right lower lobe pneumonia with small right parapneumonic effusion. Patient started on Rocephin , doxycycline  in the ED with IV hydration admitted to [facility] service for further management evaluation.  Assessment and Plan: * Right lower lobe pneumonia Generalized weakness Anorexia Chest x-ray showing RLL PNA with small right parapneumonic effusion.  Patient does have non-small cell lung cancer involving the RLL. CT chest revealed necrotizing lesion. Pulmonary on board- continue Unasyn , doxy.  Plan to change to Augmentin  for 14 days on discharge. SLP cleared him for diet. Encourage incentive spirometry, out of bed. PT/OT advised SNF.  Malignant neoplasm of lower lobe of right lung (HCC) With mets to brain s/p craniotomy and resection of left parietal lesion, with new 3mm left front lobe lesion on MRI 12/23/24, currently on palliative radiation. Follows with Dr. Sherrod and Buckley.  Last chemo on 01/12/24.   He does have expressive aphasia, unsteady gait. Due to his decline in mental  status, MRI brain to assess mets pending. Palliative Care on board for goals of care discussion.  Essential hypertension BP lower side. Continue to hold home losartan  for now, resume when BP warrants.  GERD (gastroesophageal reflux disease) Continue PPI  Hyperlipidemia Continue statin  Pancytopenia- Possibly from chemotherapy. Counts improved. S/p Filgrastim  per oncology team. Continue to trend CBC with diff.  Generalized weakness Severe calorie malnutrition- Failure to thrive PT/ OT advised SNF placement RD evaluation.    Out of bed to chair. Incentive spirometry. Nursing supportive care. Fall, aspiration precautions. Diet:  Diet Orders (From admission, onward)     Start     Ordered   01/25/24 1356  Diet regular Room service appropriate? Yes; Fluid consistency: Thin  Diet effective now       Question Answer Comment  Room service appropriate? Yes   Fluid consistency: Thin      01/25/24 1356           DVT prophylaxis: enoxaparin  (LOVENOX ) injection 40 mg Start: 01/25/24 2200  Level of care: Med-Surg   Code Status: Full Code  Subjective: Patient is seen and examined today morning. He is weak. Has word finding difficulty. Eating poor. Did not get out of bed.  Physical Exam: Vitals:   01/28/24 0538 01/28/24 1340 01/28/24 2108 01/29/24 0528  BP: (!) 123/59 (!) 125/55 (!) 110/55 (!) 114/54  Pulse: (!) 57 65 63 62  Resp: 17 16 18 16   Temp: 99.3 F (37.4 C) 99.8 F (37.7 C) 98.3 F (36.8 C) (!) 97.3 F (36.3 C)  TempSrc: Oral  Oral Oral  SpO2: 92% 97% 96% 93%    General -  Elderly weak Caucasian male, no apparent distress HEENT - PERRLA, EOMI, atraumatic head, non tender sinuses. Lung - Clear, basal rales, rhonchi, no wheezes. Heart - S1, S2 heard, no murmurs, rubs, trace pedal edema. Abdomen - Soft, non tender, bowel sounds good Neuro - Alert, awake and oriented, slow mentation, non focal exam. Skin - Warm and dry.  Data Reviewed:      Latest Ref  Rng & Units 01/29/2024    5:17 AM 01/28/2024    5:22 AM 01/26/2024    4:38 AM  CBC  WBC 4.0 - 10.5 K/uL 6.1  1.5  2.2   Hemoglobin 13.0 - 17.0 g/dL 8.7  8.7  8.9   Hematocrit 39.0 - 52.0 % 24.5  24.6  25.6   Platelets 150 - 400 K/uL 139  113  96       Latest Ref Rng & Units 01/29/2024    5:17 AM 01/28/2024    5:22 AM 01/26/2024    4:38 AM  BMP  Glucose 70 - 99 mg/dL 96  895  88   BUN 8 - 23 mg/dL 6  8  14    Creatinine 0.61 - 1.24 mg/dL 9.41  9.35  9.23   Sodium 135 - 145 mmol/L 132  133  132   Potassium 3.5 - 5.1 mmol/L 3.0  3.2  4.1   Chloride 98 - 111 mmol/L 101  99  97   CO2 22 - 32 mmol/L 23  25  26    Calcium  8.9 - 10.3 mg/dL 7.9  7.9  8.3    DG Swallowing Func-Speech Pathology Result Date: 01/28/2024 Table formatting from the original result was not included. Modified Barium Swallow Study Patient Details Name: Noah King MRN: [id number] Date of Birth: August 13, 1945 Today's Date: 01/28/2024 HPI/PMH: HPI: Noah King is a 79 yo male who presented to [facility] on 01/25/2024 due to poor PO intake, mild confusion and generalized weakness. Pt found to have RLL pna, onset was rapid. (CXR 2view RLL pna). PMH includes but is not limited to lung ca with brain mets s/p resection and palliative radiation and on chemotherapy, HTN, HLD, HTN, hyperlipidemia and GERD. Pt was followed by SLP for cognitive tx during July/Aug admission. Clinical Impression: Clinical Impression: Pt presented with functional oropharyngeal swallowing with no penetration nor aspiration, despite being taxed with large volumes of thin liquids. There was mildly decreased mobility of the larynx and the tip of the epiglottis did not completely invert, however, airway closure was reliable. The decreased epiglottic inversion led to some trapping of thicker solids above the epiglottis.  There was no back flow of material from the esophagus.  The 13 mm pill lodged briefly lateral to the epiglottis and again at the LES, but passed without  incident. Recommend pt continue regular solids/thin liquids.  Called his daughter and we discussed results/recs, including benefit of intensive oral care to help keep infection at bay in case there are occasional incidents of aspiration now or in the future. She agreed with recs.  No SLP f/u is needed. Factors that may increase risk of adverse event in presence of aspiration [name] & Lianne 2021): Factors that may increase risk of adverse event in presence of aspiration [name] & Lianne 2021): Respiratory or GI disease; Poor general health and/or compromised immunity; Frail or deconditioned Recommendations/Plan: Swallowing Evaluation Recommendations Swallowing Evaluation Recommendations Recommendations: PO diet PO Diet Recommendation: Regular; Thin liquids (Level 0) Liquid Administration via: Straw; Cup Medication Administration: Whole meds with liquid Supervision: Patient able to self-feed  Oral care recommendations: Oral care QID (4x/day) Treatment Plan Treatment Plan Follow-up recommendations: No SLP follow up Recommendations Recommendations for follow up therapy are one component of a multi-disciplinary discharge planning process, led by the attending physician.  Recommendations may be updated based on patient status, additional functional criteria and insurance authorization. Assessment: Orofacial Exam: Orofacial Exam Oral Cavity: Oral Hygiene: WFL Oral Cavity - Dentition: Adequate natural dentition Orofacial Anatomy: WFL Oral Motor/Sensory Function: WFL Anatomy: Anatomy: WFL Boluses Administered: Boluses Administered Boluses Administered: Thin liquids (Level 0); Mildly thick liquids (Level 2, nectar thick); Puree; Solid  Oral Impairment Domain: Oral Impairment Domain Lip Closure: No labial escape Tongue control during bolus hold: Cohesive bolus between tongue to palatal seal Bolus preparation/mastication: Slow prolonged chewing/mashing with complete recollection Bolus transport/lingual motion: Brisk tongue  motion Oral residue: Complete oral clearance Location of oral residue : N/A Initiation of pharyngeal swallow : Posterior angle of the ramus  Pharyngeal Impairment Domain: Pharyngeal Impairment Domain Soft palate elevation: No bolus between soft palate (SP)/pharyngeal wall (PW) Laryngeal elevation: Partial superior movement of thyroid  cartilage/partial approximation of arytenoids to epiglottic petiole Anterior hyoid excursion: Partial anterior movement Epiglottic movement: Partial inversion Laryngeal vestibule closure: Complete, no air/contrast in laryngeal vestibule Pharyngeal stripping wave : Present - complete Pharyngeal contraction (A/P view only): N/A Pharyngoesophageal segment opening: Partial distention/partial duration, partial obstruction of flow Tongue base retraction: Trace column of contrast or air between tongue base and PPW Pharyngeal residue: Collection of residue within or on pharyngeal structures Location of pharyngeal residue: Valleculae  Esophageal Impairment Domain: Esophageal Impairment Domain Esophageal clearance upright position: Complete clearance, esophageal coating Pill: Pill Consistency administered: Thin liquids (Level 0) Thin liquids (Level 0): [facility] Penetration/Aspiration Scale Score: Penetration/Aspiration Scale Score 1.  Material does not enter airway: Thin liquids (Level 0); Mildly thick liquids (Level 2, nectar thick); Puree; Solid; Pill Compensatory Strategies: Compensatory Strategies Compensatory strategies: No   General Information: Caregiver present: No  Diet Prior to this Study: Regular; Thin liquids (Level 0)   Temperature : Normal   Respiratory Status: WFL   Supplemental O2: None (Room air)   History of Recent Intubation: No  Behavior/Cognition: Alert; Cooperative; Pleasant mood Self-Feeding Abilities: Able to self-feed Baseline vocal quality/speech: Normal Volitional Cough: Able to elicit Volitional Swallow: Able to elicit Exam Limitations: No limitations Goal Planning: No data  recorded No data recorded No data recorded No data recorded Consulted and agree with results and recommendations: Patient Pain: Pain Assessment Pain Assessment: No/denies pain Faces Pain Scale: 0 End of Session: Start Time:SLP Start Time (ACUTE ONLY): 1223 Stop Time: SLP Stop Time (ACUTE ONLY): 1236 Time Calculation:SLP Time Calculation (min) (ACUTE ONLY): 13 min Charges: SLP Evaluations $ SLP Speech Visit: 1 Visit SLP Evaluations $BSS Swallow: 1 Procedure $MBS Swallow: 1 Procedure SLP visit diagnosis: SLP Visit Diagnosis: Dysphagia, unspecified (R13.10) Past Medical History: Past Medical History: Diagnosis Date  Allergy   Cataract   left  extraction- right eye forming  Dupuytren's contracture   GERD (gastroesophageal reflux disease)   Hiatal hernia   past hx-  suspect at one time  Hypertension   Polycythemia  Past Surgical History: Past Surgical History: Procedure Laterality Date  APPLICATION OF CRANIAL NAVIGATION Left 08/21/2023  Procedure: COMPUTER-ASSISTED NAVIGATION, FOR CRANIAL PROCEDURE;  Surgeon: Debby Dorn MATSU, MD;  Location: [facility] OR;  Service: Neurosurgery;  Laterality: Left;  cataract extraction left eye    COLONOSCOPY    CRANIOTOMY Left 08/21/2023  Procedure: CRANIOTOMY TUMOR EXCISION;  Surgeon: Debby Dorn MATSU, MD;  Location: [facility]  OR;  Service: Neurosurgery;  Laterality: Left;  LT CRANI FOR TUMOR RESECTION  DUPUYTREN CONTRACTURE RELEASE Left   HERNIA REPAIR  2024  POLYPECTOMY    rt knee arthroscopic    VIDEO BRONCHOSCOPY WITH ENDOBRONCHIAL ULTRASOUND Right 08/13/2023  Procedure: BRONCHOSCOPY, WITH EBUS;  Surgeon: Jude Harden GAILS, MD;  Location: [facility] ENDOSCOPY;  Service: Cardiopulmonary;  Laterality: Right; Vona Palma Laurice 01/28/2024, 1:02 PM Palma L. Vona, MA CCC/SLP Clinical Specialist - Acute Care SLP Acute Rehabilitation Services Office number [phone]   Family Communication: Discussed with patient's daughter over phone. She understand and agree. All questions answered.     Disposition: Status is: Inpatient Remains inpatient appropriate because: IV antibiotics, pneumonia versus malignancy. MRI pending, Pulmonary/ oncology follow-up  Planned Discharge Destination: Skilled nursing facility     Time spent: 44 minutes  Author: Concepcion Riser, MD 01/29/2024 1:43 PM Secure chat 7am to 7pm For on call review [email or web address]    "

## 2024-01-30 ENCOUNTER — Inpatient Hospital Stay (HOSPITAL_COMMUNITY)

## 2024-01-30 DIAGNOSIS — E43 Unspecified severe protein-calorie malnutrition: Secondary | ICD-10-CM | POA: Diagnosis not present

## 2024-01-30 DIAGNOSIS — J189 Pneumonia, unspecified organism: Secondary | ICD-10-CM | POA: Diagnosis not present

## 2024-01-30 DIAGNOSIS — D709 Neutropenia, unspecified: Secondary | ICD-10-CM | POA: Diagnosis not present

## 2024-01-30 DIAGNOSIS — D61810 Antineoplastic chemotherapy induced pancytopenia: Secondary | ICD-10-CM | POA: Diagnosis not present

## 2024-01-30 DIAGNOSIS — G9389 Other specified disorders of brain: Secondary | ICD-10-CM

## 2024-01-30 LAB — BASIC METABOLIC PANEL WITH GFR
Anion gap: 7 (ref 5–15)
BUN: 6 mg/dL — ABNORMAL LOW (ref 8–23)
CO2: 26 mmol/L (ref 22–32)
Calcium: 8.1 mg/dL — ABNORMAL LOW (ref 8.9–10.3)
Chloride: 99 mmol/L (ref 98–111)
Creatinine, Ser: 0.58 mg/dL — ABNORMAL LOW (ref 0.61–1.24)
GFR, Estimated: >60 mL/min
Glucose, Bld: 95 mg/dL (ref 70–99)
Potassium: 3.7 mmol/L (ref 3.5–5.1)
Sodium: 132 mmol/L — ABNORMAL LOW (ref 135–145)

## 2024-01-30 LAB — CBC
HCT: 25.3 % — ABNORMAL LOW (ref 39.0–52.0)
Hemoglobin: 8.9 g/dL — ABNORMAL LOW (ref 13.0–17.0)
MCH: 31 pg (ref 26.0–34.0)
MCHC: 35.2 g/dL (ref 30.0–36.0)
MCV: 88.2 fL (ref 80.0–100.0)
Platelets: 160 K/uL (ref 150–400)
RBC: 2.87 MIL/uL — ABNORMAL LOW (ref 4.22–5.81)
RDW: 13.2 % (ref 11.5–15.5)
WBC: 6.5 K/uL (ref 4.0–10.5)
nRBC: 0 % (ref 0.0–0.2)

## 2024-01-30 MED ORDER — MEGESTROL ACETATE 400 MG/10ML PO SUSP
400.0000 mg | Freq: Two times a day (BID) | ORAL | Status: DC
  Administered 2024-01-30 – 2024-02-02 (×7): 400 mg via ORAL
  Filled 2024-01-30 (×7): qty 10

## 2024-01-30 MED ORDER — AMOXICILLIN-POT CLAVULANATE 875-125 MG PO TABS
1.0000 | ORAL_TABLET | Freq: Two times a day (BID) | ORAL | Status: DC
  Administered 2024-01-31 – 2024-02-02 (×5): 1 via ORAL
  Filled 2024-01-30 (×6): qty 1

## 2024-01-30 MED ORDER — GADOBUTROL 1 MMOL/ML IV SOLN
7.0000 mL | Freq: Once | INTRAVENOUS | Status: AC | PRN
  Administered 2024-01-30: 7 mL via INTRAVENOUS

## 2024-01-30 NOTE — Care Management Important Message (Signed)
 Important Message  Patient Details IM Letter given. Name: Noah King MRN: [id number] Date of Birth: [birth year]   Important Message Given:  Yes - Medicare IM     Melba Ates 01/30/2024, 2:57 PM

## 2024-01-30 NOTE — Plan of Care (Signed)
" °  Problem: Education: Goal: Knowledge of General Education information will improve Description: Including pain rating scale, medication(s)/side effects and non-pharmacologic comfort measures Outcome: Progressing   Problem: Clinical Measurements: Goal: Ability to maintain clinical measurements within normal limits will improve Outcome: Progressing Goal: Diagnostic test results will improve Outcome: Progressing Goal: Respiratory complications will improve Outcome: Progressing Goal: Cardiovascular complication will be avoided Outcome: Progressing   Problem: Activity: Goal: Risk for activity intolerance will decrease Outcome: Progressing   Problem: Nutrition: Goal: Adequate nutrition will be maintained Outcome: Progressing   Problem: Elimination: Goal: Will not experience complications related to urinary retention Outcome: Progressing   Problem: Pain Managment: Goal: General experience of comfort will improve and/or be controlled Outcome: Progressing   Problem: Safety: Goal: Ability to remain free from injury will improve Outcome: Progressing   Problem: Skin Integrity: Goal: Risk for impaired skin integrity will decrease Outcome: Progressing   Problem: Activity: Goal: Ability to tolerate increased activity will improve Outcome: Progressing   Problem: Clinical Measurements: Goal: Ability to maintain a body temperature in the normal range will improve Outcome: Progressing   Problem: Respiratory: Goal: Ability to maintain adequate ventilation will improve Outcome: Progressing Goal: Ability to maintain a clear airway will improve Outcome: Progressing   "

## 2024-01-30 NOTE — Progress Notes (Signed)
 Physical Therapy Treatment Patient Details Name: Noah King MRN: [id number] DOB: 1945-03-16 Today's Date: 01/30/2024   History of Present Illness 79 yo male presents to therapy following hospital admission on 01/25/2024 due to poor PO intake, mild confusion and generalized weakness. Pt found to have PNA. Pt PMH includes but is not limited to lung ca with brain mets s/p resection and palliative radiation and on chemotherapy, HTN, HLD, HTN, hyperlipidemia and GERD.    PT Comments   Pt admitted with above diagnosis.  Pt currently with functional limitations due to the deficits listed below (see PT Problem List). Pt resting in bed with meal tray in front of pt. Pt easily roused and agreeable to therapy. Pt able to demonstrate improved initiation of movement toward EOB with use of hospital bed features however required min A to complete task, pt required min A x 2 for sit to stand  from EOB and able to progress to gait training 8 feet RW, min A x 2, recliner close, cues for attention, posture, proper distance from RW with pt exhibiting steppage like gait pattern. Pt left seated in recliner, all needs in place and set up for meal.  Patient will benefit from continued inpatient follow up therapy, <3 hours/day.  Pt will benefit from acute skilled PT to increase their independence and safety with mobility to allow discharge.      If plan is discharge home, recommend the following: Two people to help with walking and/or transfers;A lot of help with bathing/dressing/bathroom;Assistance with cooking/housework;Assist for transportation;Help with stairs or ramp for entrance;Supervision due to cognitive status;Direct supervision/assist for financial management;Direct supervision/assist for medications management   Can travel by private vehicle     No  Equipment Recommendations  None recommended by PT    Recommendations for Other Services       Precautions / Restrictions Precautions Precautions:  Fall Recall of Precautions/Restrictions: Impaired Restrictions Weight Bearing Restrictions Per Provider Order: No     Mobility  Bed Mobility Overal bed mobility: Needs Assistance Bed Mobility: Supine to Sit     Supine to sit: Min assist, HOB elevated, Used rails     General bed mobility comments: cues for attention to task, with mutlimodal cues and increased time use of hospital bed features    Transfers Overall transfer level: Needs assistance Equipment used: Rolling walker (2 wheels) Transfers: Sit to/from Stand Sit to Stand: From elevated surface, Min assist, +2 safety/equipment, +2 physical assistance           General transfer comment: cues, pull to stand to RW    Ambulation/Gait Ambulation/Gait assistance: Min assist, +2 safety/equipment, +2 physical assistance Gait Distance (Feet): 8 Feet Assistive device: Rolling walker (2 wheels) Gait Pattern/deviations: Step-to pattern, Decreased dorsiflexion - right, Decreased dorsiflexion - left, Steppage, Wide base of support Gait velocity: decreased     General Gait Details: steppage like gait pattern with absent B DF,  cues and facilitation for RW management for proper posture, distance from RW and safety as well as attention to task   Stairs             Wheelchair Mobility     Tilt Bed    Modified Rankin (Stroke Patients Only)       Balance Overall balance assessment: History of Falls, Needs assistance Sitting-balance support: Feet supported, Bilateral upper extremity supported Sitting balance-Leahy Scale: Fair   Postural control: Posterior lean Standing balance support: Bilateral upper extremity supported, During functional activity, Reliant on assistive device for balance Standing  balance-Leahy Scale: Poor Standing balance comment: posterior lean                            Communication Communication Communication: Impaired Factors Affecting Communication: Difficulty expressing  self  Cognition Arousal: Alert Behavior During Therapy: Flat affect   PT - Cognitive impairments: History of cognitive impairments, No family/caregiver present to determine baseline, Difficult to assess                       PT - Cognition Comments: pt requires increased time for all motor processing and planing, cues for attention to task Following commands: Impaired Following commands impaired: Follows one step commands inconsistently, Follows one step commands with increased time    Cueing Cueing Techniques: Verbal cues, Gestural cues, Tactile cues, Visual cues  Exercises      General Comments General comments (skin integrity, edema, etc.): foley in place      Pertinent Vitals/Pain Pain Assessment Pain Assessment: No/denies pain (no apparent pain response and none reported)    Home Living                          Prior Function            PT Goals (current goals can now be found in the care plan section) Acute Rehab PT Goals Patient Stated Goal: to go home PT Goal Formulation: With patient Time For Goal Achievement: 02/09/24 Potential to Achieve Goals: Fair Progress towards PT goals: Progressing toward goals    Frequency    Min 2X/week      PT Plan      Co-evaluation              AM-PAC PT 6 Clicks Mobility   Outcome Measure  Help needed turning from your back to your side while in a flat bed without using bedrails?: A Lot Help needed moving from lying on your back to sitting on the side of a flat bed without using bedrails?: A Lot Help needed moving to and from a bed to a chair (including a wheelchair)?: A Lot Help needed standing up from a chair using your arms (e.g., wheelchair or bedside chair)?: A Lot Help needed to walk in hospital room?: A Lot Help needed climbing 3-5 steps with a railing? : Total 6 Click Score: 11    End of Session Equipment Utilized During Treatment: Gait belt Activity Tolerance: Patient limited by  fatigue Patient left: with call bell/phone within reach;in chair;with chair alarm set Nurse Communication: Mobility status PT Visit Diagnosis: Unsteadiness on feet (R26.81);Other abnormalities of gait and mobility (R26.89);Muscle weakness (generalized) (M62.81);History of falling (Z91.81);Difficulty in walking, not elsewhere classified (R26.2)     Time: 8552-8541 PT Time Calculation (min) (ACUTE ONLY): 11 min  Charges:    $Therapeutic Activity: 8-22 mins PT General Charges $$ ACUTE PT VISIT: 1 Visit                     [place], PT Acute Rehab    [place] VEAR Drone 01/30/2024, 4:29 PM

## 2024-01-30 NOTE — Progress Notes (Signed)
 " Progress Note   Patient: Noah King FMW:[id number] DOB: [birth year] DOA: 01/25/2024     4 DOS: the patient was seen and examined on 01/30/2024   Brief hospital course: Noah King is a 79 y.o. male with medical history significant of lung cancer with brain mets s/p resection and palliative radiation, on chemotherapy, hypertension, hyperlipidemia presented to [facility] ED via EMS for evaluation of profound generalized weakness, poor appetite, mild confusion for the past 1-2 weeks.  Recent chemo treatment on 01/12/24.  Since that time, patient has become progressively more weak and has not been eating or drinking much of anything.  He has a cough that is apparently stable and chronic for over 20 years, has not seemed to worsen recently.  No fever, chills, rigors of sore throat.  No nausea vomiting. CT head without contrast no acute findings, mild chronic small vessel ischemic disease. Chest x-ray showed right lower lobe pneumonia with small right parapneumonic effusion. Patient started on Rocephin , doxycycline  in the ED with IV hydration admitted to [facility] service for further management evaluation.  Assessment and Plan: * Right lower lobe pneumonia Chest x-ray showing RLL PNA with small right parapneumonic effusion.  Patient does have non-small cell lung cancer involving the RLL. CT chest revealed necrotizing lesion. Pulmonary on board- continue Unasyn , doxy. Plan to change to Augmentin  for 14 days on discharge. SLP cleared him for diet. Encourage incentive spirometry, out of bed. PT/OT advised SNF. TOC working on placement.  Malignant neoplasm of lower lobe of right lung (HCC) With mets to brain s/p craniotomy and resection of left parietal lesion, with new 3mm left front lobe lesion on MRI 12/23/24, currently on palliative radiation. Follows with Dr. Sherrod and Buckley.  Last chemo on 01/12/24.   MRI brain motion abnormalities, no new lesions. Palliative Care on board. Discussed with  daughter.  Essential hypertension BP lower side. Continue to hold home losartan  for now, resume when BP warrants.  GERD (gastroesophageal reflux disease) Continue PPI  Hyperlipidemia Continue statin  Pancytopenia- Possibly from chemotherapy. Counts improved. S/p Filgrastim  per oncology team. Continue to trend CBC with diff.  Generalized weakness Anorexia Severe calorie malnutrition- Failure to thrive He is eating poor. Started megace  for appetite boost. Encourage oral diet, supplements. PT/ OT advised SNF placement     Out of bed to chair. Incentive spirometry. Nursing supportive care. Fall, aspiration precautions. Diet:  Diet Orders (From admission, onward)     Start     Ordered   01/25/24 1356  Diet regular Room service appropriate? Yes; Fluid consistency: Thin  Diet effective now       Question Answer Comment  Room service appropriate? Yes   Fluid consistency: Thin      01/25/24 1356           DVT prophylaxis: enoxaparin  (LOVENOX ) injection 40 mg Start: 01/25/24 2200  Level of care: Med-Surg   Code Status: Full Code  Subjective: Patient is seen and examined today morning. He is weak. Eating poor. Encouraged incentive spirometry, PT.  Physical Exam: Vitals:   01/29/24 2030 01/29/24 2225 01/30/24 0454 01/30/24 1347  BP:  109/64 (!) 118/54 (!) 119/53  Pulse:  65 (!) 56 66  Resp:  16 15 16   Temp:  98.4 F (36.9 C) 98 F (36.7 C) 98.3 F (36.8 C)  TempSrc:  Oral Oral Oral  SpO2:  97% 92% 95%  Weight: 74.9 kg     Height: 5' 10 (1.778 m)  General - Elderly weak Caucasian male, no apparent distress HEENT - PERRLA, EOMI, atraumatic head, non tender sinuses. Lung - Clear, basal rales, rhonchi, no wheezes. Heart - S1, S2 heard, no murmurs, rubs, trace pedal edema. Abdomen - Soft, non tender, bowel sounds good Neuro - Alert, awake and oriented, slow mentation, non focal exam. Skin - Warm and dry.  Data Reviewed:      Latest Ref Rng &  Units 01/30/2024    5:26 AM 01/29/2024    5:17 AM 01/28/2024    5:22 AM  CBC  WBC 4.0 - 10.5 K/uL 6.5  6.1  1.5   Hemoglobin 13.0 - 17.0 g/dL 8.9  8.7  8.7   Hematocrit 39.0 - 52.0 % 25.3  24.5  24.6   Platelets 150 - 400 K/uL 160  139  113       Latest Ref Rng & Units 01/30/2024    5:26 AM 01/29/2024    5:17 AM 01/28/2024    5:22 AM  BMP  Glucose 70 - 99 mg/dL 95  96  895   BUN 8 - 23 mg/dL 6  6  8    Creatinine 0.61 - 1.24 mg/dL 9.41  9.41  9.35   Sodium 135 - 145 mmol/L 132  132  133   Potassium 3.5 - 5.1 mmol/L 3.7  3.0  3.2   Chloride 98 - 111 mmol/L 99  101  99   CO2 22 - 32 mmol/L 26  23  25    Calcium  8.9 - 10.3 mg/dL 8.1  7.9  7.9    MR BRAIN W WO CONTRAST Result Date: 01/30/2024 CLINICAL DATA:  Follow-up brain metastases, lung cancer EXAM: MRI HEAD WITHOUT AND WITH CONTRAST TECHNIQUE: Multiplanar, multiecho pulse sequences of the brain and surrounding structures were obtained without and with intravenous contrast. CONTRAST:  7mL GADAVIST  GADOBUTROL  1 MMOL/ML IV SOLN COMPARISON:  December 24, 2023 FINDINGS: MRI brain: Previous small left parietal craniotomy for lesion resection. Small underlying area of encephalomalacia without enhancement. There are multifocal areas of magnetic susceptibility in the brain parenchyma involving the posterior fossa and both hemispheres No abnormal enhancement. The small medial left frontal lesion is not visible due to motion. No other lesions are identified. There is no acute or chronic infarct. The ventricles are normal. There are normal flow signals in the carotid arteries and basilar artery. No significant bone marrow signal abnormality. No significant abnormality in the paranasal sinuses or soft tissues. IMPRESSION: Previous resection of left parietal lesion. There is significant motion on the postcontrast images. There are no enhancing lesions identified, but small lesions would not be visible on this study given the degree of motion. The medial left  frontal lobe and left hippocampal lesions noted on the prior study would not be visible on this study given the degree of motion. Multiple chronic microhemorrhages are unchanged compared with the prior study. Electronically Signed   By: Nancyann Burns M.D.   On: 01/30/2024 09:22    Family Communication: Discussed with patient's daughter over phone. She understand and agree. All questions answered.    Disposition: Status is: Inpatient Remains inpatient appropriate because: IV antibiotics, weakness, placement.  Planned Discharge Destination: Skilled nursing facility     Time spent: 45 minutes  Author: Concepcion Riser, MD 01/30/2024 3:00 PM Secure chat 7am to 7pm For on call review [email or web address]    "

## 2024-01-31 DIAGNOSIS — D61810 Antineoplastic chemotherapy induced pancytopenia: Secondary | ICD-10-CM | POA: Diagnosis not present

## 2024-01-31 DIAGNOSIS — E43 Unspecified severe protein-calorie malnutrition: Secondary | ICD-10-CM | POA: Diagnosis not present

## 2024-01-31 DIAGNOSIS — J189 Pneumonia, unspecified organism: Secondary | ICD-10-CM | POA: Diagnosis not present

## 2024-01-31 DIAGNOSIS — D709 Neutropenia, unspecified: Secondary | ICD-10-CM | POA: Diagnosis not present

## 2024-01-31 MED ORDER — OLANZAPINE 2.5 MG PO TABS
2.5000 mg | ORAL_TABLET | Freq: Every day | ORAL | Status: DC
  Administered 2024-01-31 – 2024-02-01 (×2): 2.5 mg via ORAL
  Filled 2024-01-31 (×5): qty 1

## 2024-01-31 MED ORDER — HALOPERIDOL LACTATE 5 MG/ML IJ SOLN
2.0000 mg | Freq: Once | INTRAMUSCULAR | Status: AC
  Administered 2024-01-31: 2 mg via INTRAVENOUS
  Filled 2024-01-31: qty 1

## 2024-01-31 NOTE — Progress Notes (Signed)
 Notified by nurse of patient's increasing agitation and pulling at lines and pulling off condom catheter.  Chart reviewed and patient has lung cancer with brain mets postresection and palliative radiation.  Admitted currently for pneumonia.  Has associated generalized weakness anorexia protein calorie malnutrition.  Have opted to initiate low-dose Zyprexa  to help with behavioral issues as well as help with appetite stimulation.  First dose to be given now.

## 2024-01-31 NOTE — Progress Notes (Signed)
 Pt is very restless and agitated at this time. Pt pulling out lines and very combative while staff were cleaning him up. Notified hospitalist on call.

## 2024-01-31 NOTE — Plan of Care (Signed)
  Problem: Clinical Measurements: Goal: Ability to maintain clinical measurements within normal limits will improve Outcome: Progressing   Problem: Nutrition: Goal: Adequate nutrition will be maintained Outcome: Progressing   Problem: Coping: Goal: Level of anxiety will decrease Outcome: Progressing   Problem: Pain Managment: Goal: General experience of comfort will improve and/or be controlled Outcome: Progressing   Problem: Safety: Goal: Ability to remain free from injury will improve Outcome: Progressing

## 2024-01-31 NOTE — Progress Notes (Signed)
 " Progress Note   Patient: Noah King FMW:[id number] DOB: Nov 04, 1945 DOA: 01/25/2024     5 DOS: the patient was seen and examined on 01/31/2024   Brief hospital course: Noah King is a 79 y.o. male with medical history significant of lung cancer with brain mets s/p resection and palliative radiation, on chemotherapy, hypertension, hyperlipidemia presented to [facility] ED via EMS for evaluation of profound generalized weakness, poor appetite, mild confusion for the past 1-2 weeks.  Recent chemo treatment on 01/12/24.  Since that time, patient has become progressively more weak and has not been eating or drinking much of anything.  He has a cough that is apparently stable and chronic for over 20 years, has not seemed to worsen recently.  No fever, chills, rigors of sore throat.  No nausea vomiting. CT head without contrast no acute findings, mild chronic small vessel ischemic disease. Chest x-ray showed right lower lobe pneumonia with small right parapneumonic effusion. Patient started on Rocephin , doxycycline  in the ED with IV hydration admitted to [facility] service for further management evaluation.  Patient is started on Unasyn , doxy therapy per pulmonary consult. SLP cleared him for diet. He is very weak, eating poor, started megace . On and off confused which is new, MRI brain no new lesions. TOC working on SNF placement.  Assessment and Plan: * Right lower lobe pneumonia Chest x-ray showing RLL PNA with small right parapneumonic effusion.  Patient does have non-small cell lung cancer involving the RLL. CT chest revealed necrotizing lesion. Pulmonary signed off- Unasyn  changed to Augmentin  for total 14 days. He will need follow up CT chest in 4-6 weeks to ensure resolution. SLP cleared him for diet. Encourage incentive spirometry, out of bed. PT/OT advised SNF. TOC working on placement.  Malignant neoplasm of lower lobe of right lung (HCC) With mets to brain s/p craniotomy and  resection of left parietal lesion, with new 3mm left front lobe lesion on MRI 12/23/24, currently on palliative radiation. Follows with Dr. Sherrod and Buckley.  Last chemo on 01/12/24.   MRI brain motion abnormalities, no new lesions. Palliative Care on board.   Essential hypertension BP lower side. Continue to hold home losartan  for now, resume when BP warrants.  GERD (gastroesophageal reflux disease) Continue PPI  Hyperlipidemia Continue statin  Pancytopenia- Possibly from chemotherapy. Counts improved. S/p Filgrastim  per oncology team. Continue to trend CBC with diff.  Generalized weakness Anorexia Severe calorie malnutrition- Failure to thrive He is eating poor. Continue megace  for appetite boost. Encourage oral diet, supplements. PT/ OT advised SNF placement, awaiting bed placement.     Out of bed to chair. Incentive spirometry. Nursing supportive care. Fall, aspiration precautions. Diet:  Diet Orders (From admission, onward)     Start     Ordered   01/25/24 1356  Diet regular Room service appropriate? Yes; Fluid consistency: Thin  Diet effective now       Question Answer Comment  Room service appropriate? Yes   Fluid consistency: Thin      01/25/24 1356           DVT prophylaxis: enoxaparin  (LOVENOX ) injection 40 mg Start: 01/25/24 2200  Level of care: Med-Surg   Code Status: Full Code  Subjective: Patient is seen and examined today morning. He is with mittens. On and off confused. Able to follow commands. Eating poor.  Physical Exam: Vitals:   01/30/24 0454 01/30/24 1347 01/30/24 2018 01/31/24 0505  BP: (!) 118/54 (!) 119/53 132/75 (!) 117/57  Pulse: ROLLEN)  56 66 69 (!) 55  Resp: 15 16 18 18   Temp: 98 F (36.7 C) 98.3 F (36.8 C) 98.4 F (36.9 C) 98.3 F (36.8 C)  TempSrc: Oral Oral Oral Oral  SpO2: 92% 95% 92% 99%  Weight:      Height:        General - Elderly weak Caucasian male, no apparent distress HEENT - PERRLA, EOMI, atraumatic  head, non tender sinuses. Lung - Clear, basal rales, rhonchi, no wheezes. Heart - S1, S2 heard, no murmurs, rubs, trace pedal edema. Abdomen - Soft, non tender, bowel sounds good Neuro - Alert, awake and oriented, slow mentation, non focal exam. Skin - Warm and dry.  Data Reviewed:      Latest Ref Rng & Units 01/30/2024    5:26 AM 01/29/2024    5:17 AM 01/28/2024    5:22 AM  CBC  WBC 4.0 - 10.5 K/uL 6.5  6.1  1.5   Hemoglobin 13.0 - 17.0 g/dL 8.9  8.7  8.7   Hematocrit 39.0 - 52.0 % 25.3  24.5  24.6   Platelets 150 - 400 K/uL 160  139  113       Latest Ref Rng & Units 01/30/2024    5:26 AM 01/29/2024    5:17 AM 01/28/2024    5:22 AM  BMP  Glucose 70 - 99 mg/dL 95  96  895   BUN 8 - 23 mg/dL 6  6  8    Creatinine 0.61 - 1.24 mg/dL 9.41  9.41  9.35   Sodium 135 - 145 mmol/L 132  132  133   Potassium 3.5 - 5.1 mmol/L 3.7  3.0  3.2   Chloride 98 - 111 mmol/L 99  101  99   CO2 22 - 32 mmol/L 26  23  25    Calcium  8.9 - 10.3 mg/dL 8.1  7.9  7.9    MR BRAIN W WO CONTRAST Result Date: 01/30/2024 CLINICAL DATA:  Follow-up brain metastases, lung cancer EXAM: MRI HEAD WITHOUT AND WITH CONTRAST TECHNIQUE: Multiplanar, multiecho pulse sequences of the brain and surrounding structures were obtained without and with intravenous contrast. CONTRAST:  7mL GADAVIST  GADOBUTROL  1 MMOL/ML IV SOLN COMPARISON:  December 24, 2023 FINDINGS: MRI brain: Previous small left parietal craniotomy for lesion resection. Small underlying area of encephalomalacia without enhancement. There are multifocal areas of magnetic susceptibility in the brain parenchyma involving the posterior fossa and both hemispheres No abnormal enhancement. The small medial left frontal lesion is not visible due to motion. No other lesions are identified. There is no acute or chronic infarct. The ventricles are normal. There are normal flow signals in the carotid arteries and basilar artery. No significant bone marrow signal abnormality. No  significant abnormality in the paranasal sinuses or soft tissues. IMPRESSION: Previous resection of left parietal lesion. There is significant motion on the postcontrast images. There are no enhancing lesions identified, but small lesions would not be visible on this study given the degree of motion. The medial left frontal lobe and left hippocampal lesions noted on the prior study would not be visible on this study given the degree of motion. Multiple chronic microhemorrhages are unchanged compared with the prior study. Electronically Signed   By: Nancyann Burns M.D.   On: 01/30/2024 09:22    Family Communication: Discussed with patient's daughter over phone. She understand and agree. All questions answered.    Disposition: Status is: Inpatient Remains inpatient appropriate because: IV antibiotics, weakness, placement.  Planned Discharge  Destination: Skilled nursing facility     Time spent: 44 minutes  Author: Concepcion Riser, MD 01/31/2024 1:30 PM Secure chat 7am to 7pm For on call review [email or web address]    "

## 2024-02-01 DIAGNOSIS — D61810 Antineoplastic chemotherapy induced pancytopenia: Secondary | ICD-10-CM | POA: Diagnosis not present

## 2024-02-01 DIAGNOSIS — Z515 Encounter for palliative care: Secondary | ICD-10-CM | POA: Diagnosis not present

## 2024-02-01 DIAGNOSIS — G9340 Encephalopathy, unspecified: Secondary | ICD-10-CM | POA: Diagnosis not present

## 2024-02-01 DIAGNOSIS — I10 Essential (primary) hypertension: Secondary | ICD-10-CM | POA: Diagnosis not present

## 2024-02-01 DIAGNOSIS — E43 Unspecified severe protein-calorie malnutrition: Secondary | ICD-10-CM | POA: Diagnosis not present

## 2024-02-01 DIAGNOSIS — C3431 Malignant neoplasm of lower lobe, right bronchus or lung: Secondary | ICD-10-CM | POA: Diagnosis not present

## 2024-02-01 DIAGNOSIS — E785 Hyperlipidemia, unspecified: Secondary | ICD-10-CM | POA: Diagnosis not present

## 2024-02-01 DIAGNOSIS — D709 Neutropenia, unspecified: Secondary | ICD-10-CM | POA: Diagnosis not present

## 2024-02-01 DIAGNOSIS — J189 Pneumonia, unspecified organism: Secondary | ICD-10-CM | POA: Diagnosis not present

## 2024-02-01 DIAGNOSIS — C7931 Secondary malignant neoplasm of brain: Secondary | ICD-10-CM | POA: Diagnosis not present

## 2024-02-01 DIAGNOSIS — K219 Gastro-esophageal reflux disease without esophagitis: Secondary | ICD-10-CM | POA: Diagnosis not present

## 2024-02-01 LAB — BASIC METABOLIC PANEL WITH GFR
Anion gap: 10 (ref 5–15)
BUN: 5 mg/dL — ABNORMAL LOW (ref 8–23)
CO2: 25 mmol/L (ref 22–32)
Calcium: 8.6 mg/dL — ABNORMAL LOW (ref 8.9–10.3)
Chloride: 96 mmol/L — ABNORMAL LOW (ref 98–111)
Creatinine, Ser: 0.61 mg/dL (ref 0.61–1.24)
GFR, Estimated: >60 mL/min
Glucose, Bld: 99 mg/dL (ref 70–99)
Potassium: 3.1 mmol/L — ABNORMAL LOW (ref 3.5–5.1)
Sodium: 131 mmol/L — ABNORMAL LOW (ref 135–145)

## 2024-02-01 LAB — CBC
HCT: 29.8 % — ABNORMAL LOW (ref 39.0–52.0)
Hemoglobin: 10.6 g/dL — ABNORMAL LOW (ref 13.0–17.0)
MCH: 31.4 pg (ref 26.0–34.0)
MCHC: 35.6 g/dL (ref 30.0–36.0)
MCV: 88.2 fL (ref 80.0–100.0)
Platelets: 242 K/uL (ref 150–400)
RBC: 3.38 MIL/uL — ABNORMAL LOW (ref 4.22–5.81)
RDW: 13.9 % (ref 11.5–15.5)
WBC: 3.9 K/uL — ABNORMAL LOW (ref 4.0–10.5)
nRBC: 0 % (ref 0.0–0.2)

## 2024-02-01 LAB — CREATININE, SERUM
Creatinine, Ser: 0.59 mg/dL — ABNORMAL LOW (ref 0.61–1.24)
GFR, Estimated: >60 mL/min

## 2024-02-01 LAB — MAGNESIUM: Magnesium: 1.3 mg/dL — ABNORMAL LOW (ref 1.7–2.4)

## 2024-02-01 MED ORDER — LORAZEPAM 2 MG/ML IJ SOLN
1.0000 mg | Freq: Once | INTRAMUSCULAR | Status: AC
  Administered 2024-02-01: 1 mg via INTRAVENOUS
  Filled 2024-02-01: qty 1

## 2024-02-01 MED ORDER — LORAZEPAM 2 MG/ML IJ SOLN
1.0000 mg | Freq: Four times a day (QID) | INTRAMUSCULAR | Status: DC | PRN
  Administered 2024-02-02 – 2024-02-04 (×2): 1 mg via INTRAVENOUS
  Filled 2024-02-01 (×2): qty 1

## 2024-02-01 MED ORDER — POTASSIUM CHLORIDE 20 MEQ PO PACK
40.0000 meq | PACK | Freq: Every day | ORAL | Status: DC
  Administered 2024-02-01 – 2024-02-04 (×3): 40 meq via ORAL
  Filled 2024-02-01 (×3): qty 2

## 2024-02-01 MED ORDER — POTASSIUM CHLORIDE 10 MEQ/100ML IV SOLN: 10.0000 meq | INTRAVENOUS | Status: DC

## 2024-02-01 NOTE — Progress Notes (Signed)
 " Progress Note   Patient: Noah King FMW:[id number] DOB: 1945-04-02 DOA: 01/25/2024     6 DOS: the patient was seen and examined on 02/01/2024   Brief hospital course: Aayden C Channell is a 79 y.o. male with medical history significant of lung cancer with brain mets s/p resection and palliative radiation, on chemotherapy, hypertension, hyperlipidemia presented to [facility] ED via EMS for evaluation of profound generalized weakness, poor appetite, mild confusion for the past 1-2 weeks.  Recent chemo treatment on 01/12/24.  Since that time, patient has become progressively more weak and has not been eating or drinking much of anything.  He has a cough that is apparently stable and chronic for over 20 years, has not seemed to worsen recently.  No fever, chills, rigors of sore throat.  No nausea vomiting. CT head without contrast no acute findings, mild chronic small vessel ischemic disease. Chest x-ray showed right lower lobe pneumonia with small right parapneumonic effusion. Patient started on Rocephin , doxycycline  in the ED with IV hydration admitted to [facility] service for further management evaluation.  Patient is started on Unasyn , doxy therapy per pulmonary consult. SLP cleared him for diet. He is very weak, eating poor, started megace . On and off confused which is new, MRI brain no new lesions. TOC working on SNF placement.  Assessment and Plan: * Right lower lobe pneumonia Chest x-ray showing RLL PNA with small right parapneumonic effusion.  Patient does have non-small cell lung cancer involving the RLL. CT chest revealed necrotizing lesion. Pulmonary signed off- Unasyn  changed to Augmentin  for total 14 days. He will need follow up CT chest in 4-6 weeks to ensure resolution. SLP cleared him for diet. Encourage incentive spirometry, out of bed. PT/OT advised SNF. TOC working on placement.  Acute Metabolic encephalopathy- Hospital delirium, more confused, requiring mittens. Will repeat  labs today. Discuss with oncology to get neuro onc Dr. Buckley evaluation and need for repeat MRI. Continue delirium precautions. Ativan  as needed. Zyprexa  at night ordered.  Malignant neoplasm of lower lobe of right lung (HCC) With mets to brain s/p craniotomy and resection of left parietal lesion, with new 3mm left front lobe lesion on MRI 12/23/24, currently on palliative radiation. Follows with Dr. Sherrod and Buckley.  Last chemo on 01/12/24.   MRI brain motion abnormalities, no new lesions. Palliative Care on board.   Hypokalemia- IV kcl supplements ordered. Add mag level. He is eating poor.  Essential hypertension BP lower side. Continue to hold home losartan  for now, resume when BP warrants.  GERD (gastroesophageal reflux disease) Continue PPI  Hyperlipidemia Continue statin  Pancytopenia- Possibly from chemotherapy. Counts improved. S/p Filgrastim  per oncology team. Continue to trend CBC with diff.  Generalized weakness Anorexia Severe calorie malnutrition- Failure to thrive He is eating poor. Continue megace  for appetite boost. Encourage oral diet, supplements. PT/ OT advised SNF placement, awaiting bed placement.     Out of bed to chair. Incentive spirometry. Nursing supportive care. Fall, aspiration precautions. Diet:  Diet Orders (From admission, onward)     Start     Ordered   01/25/24 1356  Diet regular Room service appropriate? Yes; Fluid consistency: Thin  Diet effective now       Question Answer Comment  Room service appropriate? Yes   Fluid consistency: Thin      01/25/24 1356           DVT prophylaxis: enoxaparin  (LOVENOX ) injection 40 mg Start: 01/25/24 2200  Level of care: Med-Surg   Code  Status: Full Code  Subjective: Patient is seen and examined today morning. He is more confused today. Has mittens. Able to follow commands. Eating poor. Got Haldol  last night.  Physical Exam: Vitals:   01/31/24 1405 01/31/24 2011 02/01/24 0521  02/01/24 1358  BP: 115/70 117/68 (!) 153/70 136/63  Pulse: 66 73 72 87  Resp: 14 18 18 18   Temp: 97.7 F (36.5 C) 98.8 F (37.1 C) 98.5 F (36.9 C) 98.5 F (36.9 C)  TempSrc: Oral Oral Oral Oral  SpO2: 94% 98% 98% 90%  Weight:      Height:        General - Elderly weak Caucasian male, no apparent distress HEENT - PERRLA, EOMI, atraumatic head, non tender sinuses. Lung - Clear, basal rales, rhonchi, no wheezes. Heart - S1, S2 heard, no murmurs, rubs, trace pedal edema. Abdomen - Soft, non tender, bowel sounds good Neuro - Alert, able to answer slowly, non focal exam. Skin - Warm and dry.  Data Reviewed:      Latest Ref Rng & Units 02/01/2024    1:38 PM 01/30/2024    5:26 AM 01/29/2024    5:17 AM  CBC  WBC 4.0 - 10.5 K/uL 3.9  6.5  6.1   Hemoglobin 13.0 - 17.0 g/dL 89.3  8.9  8.7   Hematocrit 39.0 - 52.0 % 29.8  25.3  24.5   Platelets 150 - 400 K/uL 242  160  139       Latest Ref Rng & Units 02/01/2024    1:38 PM 02/01/2024    5:13 AM 01/30/2024    5:26 AM  BMP  Glucose 70 - 99 mg/dL 99   95   BUN 8 - 23 mg/dL 5   6   Creatinine 9.38 - 1.24 mg/dL 9.38  9.40  9.41   Sodium 135 - 145 mmol/L 131   132   Potassium 3.5 - 5.1 mmol/L 3.1   3.7   Chloride 98 - 111 mmol/L 96   99   CO2 22 - 32 mmol/L 25   26   Calcium  8.9 - 10.3 mg/dL 8.6   8.1    No results found.   Family Communication: no family at bedside.    Disposition: Status is: Inpatient Remains inpatient appropriate because: antibiotics, weakness, confused, placement.  Planned Discharge Destination: Skilled nursing facility     Time spent: 47 minutes  Author: Concepcion Riser, MD 02/01/2024 3:19 PM Secure chat 7am to 7pm For on call review [email or web address]    "

## 2024-02-01 NOTE — Plan of Care (Signed)

## 2024-02-01 NOTE — Progress Notes (Addendum)
 Pt is so agitated at this time. Pt is hitting the bed rails and and pt can not be redirected. Pt has continuously been aggressive and combative. Notified hospitalist. New orders received.

## 2024-02-01 NOTE — Progress Notes (Signed)
 " Daily Progress Note   Patient Name: Noah King       Date: 02/01/2024 DOB: 1945/07/25  Age: 79 y.o. MRN#: [id number] Attending Physician: Darci Pore, MD Primary Care Physician: Merna Huxley, NP Admit Date: 01/25/2024 Length of Stay: 6 days  Reason for Follow-up: Establishing goals of care  Subjective:   CC: Patient confused noting he is going home to have surgery.  Medicine team following up regarding goals of care.  Reviewed hospitalist documentation from yesterday since patient was not seen by palliative medicine team yesterday.  Patient noted to have worsening agitation at times, requiring mittens.  Reviewed RN documentation from overnight which noted patient became more agitated and was hitting rouse and could not be redirected.  Patient received IV Haldol  2 mg x 1 dose.  Has been started on olanzapine  2.5 mg nightly by hospitalist on 01/31/2024. MRI obtained on 01/30/2024 noted no enhancing lesions identified though this would be difficult to identify small lesions given the amount of significant motion for patient. Reviewed I&Os 1826 which noted total of 2160 mL oral intake. Discussed care with bedside RN for medical updates.  RN noted that patient did not want to eat breakfast this morning though was willing to drink some Ensure.  Patient remains confused at this time.  Presented to bedside to see patient.  Patient laying in bed awake with mittens in place.  Attempted to interact with patient though he is overall confused.  Patient noting that he was going to hold to have surgery at this time.  No visitors were present at bedside during visit.  Noted palliative medicine team will continue to follow with patient's medical journey.  Discussed care with hospitalist and RN to coordinate care.  Inquired about involvement of Dr. Buckley if candesartan was about mental status being affected by metastatic cancer and if further imaging needed to be obtained for management.   Patient's encephalopathy could also be related to patient's acute illness and delirium. Currently plan is for SNF for rehab though needing improvement of patient's mentation to accomplish this.  Objective:   Vital Signs:  BP (!) 153/70 (BP Location: Right Arm)   Pulse 72   Temp 98.5 F (36.9 C) (Oral)   Resp 18   Ht 5' 10 (1.778 m)   Wt 74.9 kg   SpO2 98%   BMI 23.69 kg/m   Physical Exam: General: NAD, awake, confused Cardiovascular: RRR Respiratory: no increased work of breathing noted, not in respiratory distress Extremities: Upper extremities admittance bilaterally Abdomen: not distended  Assessment & Plan:   Assessment: Patient is a 79 year old male with a past medical history of metastatic lung cancer with mets to the brain status postresection and palliative radiation on chemotherapy, hypertension, and hyperlipidemia who was admitted on 11/14/2024 after noted generalized weakness, poor appetite, and mild confusion.  Patient's most recent chemotherapy was on 01/12/2024.  Since admission, patient receiving management of cavitary pneumonia.  Pulmonology has been following for recommendations.  Palliative medicine team consulted to assist with goals of care.  Recommendations/Plan: # Complex medical decision making/goals of care:  - Patient still remains confused with a waxing and waning mental status since last Thursday.  Though had appeared to be improving, patient having worsening agitation particularly at night.  Concerned about delirium in the setting of acute medical illness and hospitalization versus potential association with patient's underlying known metastatic cancer to brain.  Daughter has already expressed that she is hopeful for continued aggressive medical interventions so that patient would  be able to return to his prior baseline.  Inquired with hospitalist about involvement of Dr. Buckley if concerns about patient's mentation being related to patient's known  metastatic cancer to brain.  MRI had lots of motion artifact and radiologist noted would not be able to see small lesions if present.  Unsure if further imaging needs to be obtained with premedications to prevent agitation causing movement during MRI.  Palliative medicine team continuing to follow along with patient's medical journey.  -  Code Status: Full Code  # Psychosocial Support:  - Daughter: Noah King who is patient's only child  # Discharge Planning: To Be Determined  Thank you for allowing the palliative care team to participate in the care Zein C Ramseyer.  Tinnie Radar, DO Palliative Care Provider PMT # [phone]  If patient remains symptomatic despite maximum doses, please call PMT at [phone] between 0700 and 1900. Outside of these hours, please call attending, as PMT does not have night coverage.  Personally spent 25 minutes in patient care including extensive chart review (labs, imaging, progress/consult notes, vital signs), medically appropraite exam, discussed with treatment team, education to patient, family, and staff, documenting clinical information, medication review and management, coordination of care, and available advanced directive documents.   "

## 2024-02-01 NOTE — TOC Progression Note (Signed)
 Transition of Care [facility]) - Progression Note    Patient Details  Name: Noah King MRN: [id number] Date of Birth: 08/14/1945  [facility] CM/SW Contact  Doneta Glenys DASEN, RN Phone Number: 02/01/2024, 4:13 PM  Clinical Narrative:    CM called [facility] not able to leave a message. Left a list of SNF in/near [place], [place] to consider.    Expected Discharge Plan: Skilled Nursing Facility Barriers to Discharge: Continued Medical Work up               Expected Discharge Plan and Services In-house Referral: Clinical Social Work   Post Acute Care Choice: Skilled Nursing Facility Living arrangements for the past 2 months: Single Family Home                 DME Arranged: N/A DME Agency: NA                   Social Drivers of Health (SDOH) Interventions SDOH Screenings   Food Insecurity: No Food Insecurity (01/25/2024)  Housing: Low Risk (01/25/2024)  Transportation Needs: No Transportation Needs (01/25/2024)  Utilities: Not At Risk (01/25/2024)  Alcohol Screen: Low Risk (11/01/2021)  Depression (PHQ2-9): Low Risk (01/12/2024)  Financial Resource Strain: Low Risk (11/01/2021)  Physical Activity: Insufficiently Active (11/27/2023)  Social Connections: Socially Isolated (01/25/2024)  Stress: No Stress Concern Present (11/27/2023)  Tobacco Use: Medium Risk (12/18/2023)    Readmission Risk Interventions     No data to display

## 2024-02-01 NOTE — Plan of Care (Addendum)
" °  Problem: Education: Goal: Knowledge of General Education information will improve Description: Including pain rating scale, medication(s)/side effects and non-pharmacologic comfort measures Outcome: Progressing   Problem: Health Behavior/Discharge Planning: Goal: Ability to manage health-related needs will improve Outcome: Progressing   Problem: Clinical Measurements: Goal: Ability to maintain clinical measurements within normal limits will improve Outcome: Progressing Goal: Will remain free from infection Outcome: Progressing Goal: Diagnostic test results will improve Outcome: Progressing Goal: Respiratory complications will improve Outcome: Progressing Goal: Cardiovascular complication will be avoided Outcome: Progressing   Problem: Activity: Goal: Risk for activity intolerance will decrease Outcome: Progressing   Problem: Nutrition: Goal: Adequate nutrition will be maintained Outcome: Progressing   Problem: Coping: Goal: Level of anxiety will decrease Outcome: Progressing   Problem: Elimination: Goal: Will not experience complications related to bowel motility Outcome: Progressing Goal: Will not experience complications related to urinary retention Outcome: Progressing   Problem: Pain Managment: Goal: General experience of comfort will improve and/or be controlled Outcome: Progressing   Problem: Safety: Goal: Ability to remain free from injury will improve Outcome: Progressing   Problem: Skin Integrity: Goal: Risk for impaired skin integrity will decrease Outcome: Progressing   Problem: Activity: Goal: Ability to tolerate increased activity will improve Outcome: Progressing   Problem: Clinical Measurements: Goal: Ability to maintain a body temperature in the normal range will improve Outcome: Progressing   Problem: Respiratory: Goal: Ability to maintain adequate ventilation will improve Outcome: Progressing Goal: Ability to maintain a clear airway  will improve Outcome: Progressing   Problem: Activity: Goal: Risk for activity intolerance will decrease Outcome: Progressing   Problem: Nutrition: Goal: Adequate nutrition will be maintained Outcome: Progressing   "

## 2024-02-02 ENCOUNTER — Other Ambulatory Visit: Payer: Self-pay | Admitting: Physician Assistant

## 2024-02-02 ENCOUNTER — Inpatient Hospital Stay

## 2024-02-02 ENCOUNTER — Inpatient Hospital Stay (HOSPITAL_COMMUNITY)

## 2024-02-02 ENCOUNTER — Encounter (HOSPITAL_COMMUNITY): Payer: Self-pay | Admitting: Internal Medicine

## 2024-02-02 ENCOUNTER — Inpatient Hospital Stay: Admitting: Physician Assistant

## 2024-02-02 DIAGNOSIS — Z515 Encounter for palliative care: Secondary | ICD-10-CM

## 2024-02-02 DIAGNOSIS — C3431 Malignant neoplasm of lower lobe, right bronchus or lung: Secondary | ICD-10-CM | POA: Diagnosis not present

## 2024-02-02 DIAGNOSIS — R630 Anorexia: Secondary | ICD-10-CM

## 2024-02-02 DIAGNOSIS — C7931 Secondary malignant neoplasm of brain: Secondary | ICD-10-CM | POA: Diagnosis not present

## 2024-02-02 DIAGNOSIS — J189 Pneumonia, unspecified organism: Secondary | ICD-10-CM | POA: Diagnosis not present

## 2024-02-02 DIAGNOSIS — E43 Unspecified severe protein-calorie malnutrition: Secondary | ICD-10-CM | POA: Diagnosis not present

## 2024-02-02 DIAGNOSIS — I10 Essential (primary) hypertension: Secondary | ICD-10-CM | POA: Diagnosis not present

## 2024-02-02 DIAGNOSIS — D61810 Antineoplastic chemotherapy induced pancytopenia: Secondary | ICD-10-CM | POA: Diagnosis not present

## 2024-02-02 DIAGNOSIS — G9340 Encephalopathy, unspecified: Secondary | ICD-10-CM

## 2024-02-02 LAB — BASIC METABOLIC PANEL WITH GFR
Anion gap: 10 (ref 5–15)
BUN: 5 mg/dL — ABNORMAL LOW (ref 8–23)
CO2: 27 mmol/L (ref 22–32)
Calcium: 8.5 mg/dL — ABNORMAL LOW (ref 8.9–10.3)
Chloride: 99 mmol/L (ref 98–111)
Creatinine, Ser: 0.64 mg/dL (ref 0.61–1.24)
GFR, Estimated: >60 mL/min
Glucose, Bld: 91 mg/dL (ref 70–99)
Potassium: 3.8 mmol/L (ref 3.5–5.1)
Sodium: 136 mmol/L (ref 135–145)

## 2024-02-02 LAB — CBC
HCT: 27.9 % — ABNORMAL LOW (ref 39.0–52.0)
Hemoglobin: 9.8 g/dL — ABNORMAL LOW (ref 13.0–17.0)
MCH: 30.9 pg (ref 26.0–34.0)
MCHC: 35.1 g/dL (ref 30.0–36.0)
MCV: 88 fL (ref 80.0–100.0)
Platelets: 242 K/uL (ref 150–400)
RBC: 3.17 MIL/uL — ABNORMAL LOW (ref 4.22–5.81)
RDW: 13.9 % (ref 11.5–15.5)
WBC: 5.4 K/uL (ref 4.0–10.5)
nRBC: 0.6 % — ABNORMAL HIGH (ref 0.0–0.2)

## 2024-02-02 MED ORDER — KCL IN DEXTROSE-NACL 20-5-0.9 MEQ/L-%-% IV SOLN
INTRAVENOUS | Status: AC
  Filled 2024-02-02 (×2): qty 1000

## 2024-02-02 MED ORDER — GLYCOPYRROLATE 0.2 MG/ML IJ SOLN
0.1000 mg | Freq: Once | INTRAMUSCULAR | Status: AC
  Administered 2024-02-03: 0.1 mg via INTRAVENOUS
  Filled 2024-02-02: qty 1

## 2024-02-02 MED ORDER — SCOPOLAMINE 1 MG/3DAYS TD PT72
1.0000 | MEDICATED_PATCH | TRANSDERMAL | Status: DC
  Administered 2024-02-03: 1 mg via TRANSDERMAL
  Filled 2024-02-02: qty 1

## 2024-02-02 MED ORDER — MAGNESIUM OXIDE -MG SUPPLEMENT 400 (240 MG) MG PO TABS
400.0000 mg | ORAL_TABLET | Freq: Two times a day (BID) | ORAL | Status: DC
  Administered 2024-02-02 – 2024-02-04 (×2): 400 mg via ORAL
  Filled 2024-02-02 (×3): qty 1

## 2024-02-02 NOTE — Plan of Care (Signed)

## 2024-02-02 NOTE — Progress Notes (Addendum)
 Noah King   DOB:13-Jun-1945   FM#:[id number]      CLINICAL SUMMARY:  Noah King a 79 year old male patient who presented on 01/25/2024 with complaints of progressive weakness, poor attic tight, cough and confusion. Oncologic history significant for lung cancer. Medical oncology following.    ASSESSMENT & PLAN:  Altered mental status Chronic microhemorrhages of brain - Initially appeared to be improving however noted with significant decline in mentation today.  Discussed with nurse who states that patient is not swallowing, pockets the meds in his mouth and spits it out.  Has been unable to follow simple commands.  He has worsening paranoia. -MRI brain 12/24/2023 showed 3 mm enhancing nodular lesion within left frontal lobe.  Repeat MR attempted 01/30/2024 however significant motion.  No enhancing lesions were identified but study states that small lesions would not be visible given the degree of motion.  There were noted multiple chronic microhemorrhages which were unchanged compared with prior study. - Follows with neuro outpatient oncology Dr. Buckley.  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  Underwent concurrent chemoradiation with weekly carbo + paclitaxel  x 5 cycles.  Subsequently transition to carbo + pemetrexed  + Pembro, started 01/12/2024. - Recommend palliative for goals of care - Medical oncology/Dr. Sherrod following  Anemia - Hemoglobin 9.8 - Likely multifactorial - No transfusional intervention required at this time - Continue to monitor CBC with differential   Code Status Full  Subjective:  Patient seen awake and alert laying in bed.  No family or visitor at bedside.  Attempted to engage patient in conversation, however unable to have any meaningful conversation.  He is mumbling and is not coherent.  He is not oriented which is a big change from when he was seen last week.      Objective:   Intake/Output Summary (Last 24 hours) at 02/02/2024 0933 Last data filed at 02/02/2024 9081 Gross per 24 hour  Intake 720 ml  Output 3425 ml  Net -2705 ml     PHYSICAL EXAMINATION: ECOG PERFORMANCE STATUS: 4 - Bedbound  Vitals:   02/01/24 2020 02/02/24 0404  BP: (!) 154/66 (!) 141/64  Pulse: (!) 110 71  Resp: 20 18  Temp: 99.4 F (37.4 C) 97.9 F (36.6 C)  SpO2: 99% 100%   Filed Weights   01/29/24 2030  Weight: 165 lb 2 oz (74.9 kg)    GENERAL: alert, + altered mental status SKIN: + Pale skin color, texture, turgor are normal, no rashes or significant lesions EYES: normal, conjunctiva are pink and non-injected, sclera clear OROPHARYNX: no exudate, no erythema and lips, buccal mucosa, and tongue normal  NECK: supple, thyroid  normal size, non-tender, without nodularity LYMPH: no palpable lymphadenopathy in the cervical, axillary or inguinal LUNGS: clear to auscultation and percussion with normal breathing effort HEART: regular rate & rhythm and no murmurs and no lower extremity edema ABDOMEN: abdomen soft, non-tender and normal bowel sounds MUSCULOSKELETAL: no cyanosis of digits and no clubbing  PSYCH: + Altered mental status NEURO: no focal motor/sensory deficits   All questions were answered. The patient knows to call the clinic with any problems, questions or concerns.   I personally spent a total of 40 minutes minutes in the care of the patient today including preparing to see the patient, performing a medically appropriate exam/evaluation, referring and communicating with other health care professionals, documenting clinical information in the EHR, communicating results, and coordinating care.    Olam  J Rouson, NP 02/02/2024 9:33 AM    Labs Reviewed:  Lab Results  Component Value Date   WBC 5.4 02/02/2024   HGB 9.8 (L) 02/02/2024   HCT 27.9 (L) 02/02/2024   MCV 88.0 02/02/2024   PLT 242 02/02/2024   Recent Labs    12/09/23 0856  12/18/23 1032 01/12/24 1339 01/25/24 1155 01/30/24 0526 02/01/24 0513 02/01/24 1338 02/02/24 0426  NA 140 140 137   < > 132*  --  131* 136  K 4.6 4.4 4.0   < > 3.7  --  3.1* 3.8  CL 104 103 100   < > 99  --  96* 99  CO2 27 29 26    < > 26  --  25 27  GLUCOSE 117* 103* 106*   < > 95  --  99 91  BUN 10 10 11    < > 6*  --  5* 5*  CREATININE 0.85 0.79 0.75   < > 0.58* 0.59* 0.61 0.64  CALCIUM  10.0 9.8 9.3   < > 8.1*  --  8.6* 8.5*  GFRNONAA >60  --  >60   < > >60 >60 >60 >60  PROT 6.9 6.9 7.0  --   --   --   --   --   ALBUMIN  4.3 4.4 3.9  --   --   --   --   --   AST 23 20 18   --   --   --   --   --   ALT 23 21 12   --   --   --   --   --   ALKPHOS 85 69 92  --   --   --   --   --   BILITOT 1.2 1.4* 0.8  --   --   --   --   --    < > = values in this interval not displayed.    Studies Reviewed:   MR BRAIN W WO CONTRAST Result Date: 01/30/2024 CLINICAL DATA:  Follow-up brain metastases, lung cancer EXAM: MRI HEAD WITHOUT AND WITH CONTRAST TECHNIQUE: Multiplanar, multiecho pulse sequences of the brain and surrounding structures were obtained without and with intravenous contrast. CONTRAST:  7mL GADAVIST  GADOBUTROL  1 MMOL/ML IV SOLN COMPARISON:  December 24, 2023 FINDINGS: MRI brain: Previous small left parietal craniotomy for lesion resection. Small underlying area of encephalomalacia without enhancement. There are multifocal areas of magnetic susceptibility in the brain parenchyma involving the posterior fossa and both hemispheres No abnormal enhancement. The small medial left frontal lesion is not visible due to motion. No other lesions are identified. There is no acute or chronic infarct. The ventricles are normal. There are normal flow signals in the carotid arteries and basilar artery. No significant bone marrow signal abnormality. No significant abnormality in the paranasal sinuses or soft tissues. IMPRESSION: Previous resection of left parietal lesion. There is significant motion on the  postcontrast images. There are no enhancing lesions identified, but small lesions would not be visible on this study given the degree of motion. The medial left frontal lobe and left hippocampal lesions noted on the prior study would not be visible on this study given the degree of motion. Multiple chronic microhemorrhages are unchanged compared with the prior study. Electronically Signed   By: Nancyann Burns M.D.   On: 01/30/2024 09:22   DG Swallowing Func-Speech Pathology Result Date: 01/28/2024 Table formatting from the original result was not included. Modified Barium Swallow Study Patient  Details Name: TALLIE HEVIA MRN: [id number] Date of Birth: [birth year] Today's Date: 01/28/2024 HPI/PMH: HPI: Willis Gebhard is a 79 yo male who presented to [facility] on [facility]/25/2024 due to poor PO intake, mild confusion and generalized weakness. Pt found to have RLL pna, onset was rapid. (CXR 2view RLL pna). PMH includes but is not limited to lung ca with brain mets s/p resection and palliative radiation and on chemotherapy, HTN, HLD, HTN, hyperlipidemia and GERD. Pt was followed by SLP for cognitive tx during July/Aug admission. Clinical Impression: Clinical Impression: Pt presented with functional oropharyngeal swallowing with no penetration nor aspiration, despite being taxed with large volumes of thin liquids. There was mildly decreased mobility of the larynx and the tip of the epiglottis did not completely invert, however, airway closure was reliable. The decreased epiglottic inversion led to some trapping of thicker solids above the epiglottis.  There was no back flow of material from the esophagus.  The 13 mm pill lodged briefly lateral to the epiglottis and again at the LES, but passed without incident. Recommend pt continue regular solids/thin liquids.  Called his daughter and we discussed results/recs, including benefit of intensive oral care to help keep infection at bay in case there are occasional incidents of  aspiration now or in the future. She agreed with recs.  No SLP f/u is needed. Factors that may increase risk of adverse event in presence of aspiration [name] & Lianne 2021): Factors that may increase risk of adverse event in presence of aspiration [name] & Lianne 2021): Respiratory or GI disease; Poor general health and/or compromised immunity; Frail or deconditioned Recommendations/Plan: Swallowing Evaluation Recommendations Swallowing Evaluation Recommendations Recommendations: PO diet PO Diet Recommendation: Regular; Thin liquids (Level 0) Liquid Administration via: Straw; Cup Medication Administration: Whole meds with liquid Supervision: Patient able to self-feed Oral care recommendations: Oral care QID (4x/day) Treatment Plan Treatment Plan Follow-up recommendations: No SLP follow up Recommendations Recommendations for follow up therapy are one component of a multi-disciplinary discharge planning process, led by the attending physician.  Recommendations may be updated based on patient status, additional functional criteria and insurance authorization. Assessment: Orofacial Exam: Orofacial Exam Oral Cavity: Oral Hygiene: WFL Oral Cavity - Dentition: Adequate natural dentition Orofacial Anatomy: WFL Oral Motor/Sensory Function: WFL Anatomy: Anatomy: WFL Boluses Administered: Boluses Administered Boluses Administered: Thin liquids (Level 0); Mildly thick liquids (Level 2, nectar thick); Puree; Solid  Oral Impairment Domain: Oral Impairment Domain Lip Closure: No labial escape Tongue control during bolus hold: Cohesive bolus between tongue to palatal seal Bolus preparation/mastication: Slow prolonged chewing/mashing with complete recollection Bolus transport/lingual motion: Brisk tongue motion Oral residue: Complete oral clearance Location of oral residue : N/A Initiation of pharyngeal swallow : Posterior angle of the ramus  Pharyngeal Impairment Domain: Pharyngeal Impairment Domain Soft palate elevation: No  bolus between soft palate (SP)/pharyngeal wall (PW) Laryngeal elevation: Partial superior movement of thyroid  cartilage/partial approximation of arytenoids to epiglottic petiole Anterior hyoid excursion: Partial anterior movement Epiglottic movement: Partial inversion Laryngeal vestibule closure: Complete, no air/contrast in laryngeal vestibule Pharyngeal stripping wave : Present - complete Pharyngeal contraction (A/P view only): N/A Pharyngoesophageal segment opening: Partial distention/partial duration, partial obstruction of flow Tongue base retraction: Trace column of contrast or air between tongue base and PPW Pharyngeal residue: Collection of residue within or on pharyngeal structures Location of pharyngeal residue: Valleculae  Esophageal Impairment Domain: Esophageal Impairment Domain Esophageal clearance upright position: Complete clearance, esophageal coating Pill: Pill Consistency administered: Thin liquids (Level 0) Thin liquids (Level 0): [facility] Penetration/Aspiration  Scale Score: Penetration/Aspiration Scale Score 1.  Material does not enter airway: Thin liquids (Level 0); Mildly thick liquids (Level 2, nectar thick); Puree; Solid; Pill Compensatory Strategies: Compensatory Strategies Compensatory strategies: No   General Information: Caregiver present: No  Diet Prior to this Study: Regular; Thin liquids (Level 0)   Temperature : Normal   Respiratory Status: WFL   Supplemental O2: None (Room air)   History of Recent Intubation: No  Behavior/Cognition: Alert; Cooperative; Pleasant mood Self-Feeding Abilities: Able to self-feed Baseline vocal quality/speech: Normal Volitional Cough: Able to elicit Volitional Swallow: Able to elicit Exam Limitations: No limitations Goal Planning: No data recorded No data recorded No data recorded No data recorded Consulted and agree with results and recommendations: Patient Pain: Pain Assessment Pain Assessment: No/denies pain Faces Pain Scale: 0 End of Session: Start  Time:SLP Start Time (ACUTE ONLY): 1223 Stop Time: SLP Stop Time (ACUTE ONLY): 1236 Time Calculation:SLP Time Calculation (min) (ACUTE ONLY): 13 min Charges: SLP Evaluations $ SLP Speech Visit: 1 Visit SLP Evaluations $BSS Swallow: 1 Procedure $MBS Swallow: 1 Procedure SLP visit diagnosis: SLP Visit Diagnosis: Dysphagia, unspecified (R13.10) Past Medical History: Past Medical History: Diagnosis Date  Allergy   Cataract   left  extraction- right eye forming  Dupuytren's contracture   GERD (gastroesophageal reflux disease)   Hiatal hernia   past hx-  suspect at one time  Hypertension   Polycythemia  Past Surgical History: Past Surgical History: Procedure Laterality Date  APPLICATION OF CRANIAL NAVIGATION Left 08/21/2023  Procedure: COMPUTER-ASSISTED NAVIGATION, FOR CRANIAL PROCEDURE;  Surgeon: Debby Dorn MATSU, MD;  Location: [facility] OR;  Service: Neurosurgery;  Laterality: Left;  cataract extraction left eye    COLONOSCOPY    CRANIOTOMY Left 08/21/2023  Procedure: CRANIOTOMY TUMOR EXCISION;  Surgeon: Debby Dorn MATSU, MD;  Location: [facility] OR;  Service: Neurosurgery;  Laterality: Left;  LT CRANI FOR TUMOR RESECTION  DUPUYTREN CONTRACTURE RELEASE Left   HERNIA REPAIR  2024  POLYPECTOMY    rt knee arthroscopic    VIDEO BRONCHOSCOPY WITH ENDOBRONCHIAL ULTRASOUND Right 08/13/2023  Procedure: BRONCHOSCOPY, WITH EBUS;  Surgeon: Jude Harden GAILS, MD;  Location: [facility] ENDOSCOPY;  Service: Cardiopulmonary;  Laterality: Right; Vona Palma Laurice 01/28/2024, 1:02 PM Palma L. Vona, MA CCC/SLP Clinical Specialist - Acute Care SLP Acute Rehabilitation Services Office number [phone]  CT CHEST W CONTRAST Result Date: 01/27/2024 EXAM: CT CHEST WITH CONTRAST 01/26/2024 06:05:00 PM TECHNIQUE: CT of the chest was performed with the administration of intravenous contrast. Multiplanar reformatted images are provided for review. Automated exposure control, iterative reconstruction, and/or weight based adjustment of the mA/kV was  utilized to reduce the radiation dose to as low as reasonably achievable. COMPARISON: 12/09/2023 CLINICAL HISTORY: Restaging non-small cell lung cancer. FINDINGS: MEDIASTINUM: Aortic atherosclerosis and coronary artery calcifications. No pericardial effusion. High right paratracheal lymph node is increased in size from the previous exam, measuring 9 mm. Previously 5 mm. The previous right hilar lymph node measures 1.4 cm, image 81/2. Previously 1.1 cm. LYMPH NODES: High-riding paratracheal lymph node is increased in size from the previous exam, measuring 9 mm. Previously 5 mm. The previous right hilar lymph node measures 1.4 cm, image 81/2. Previously 1.1 cm. LUNGS AND PLEURA: New airspace consolidation involving greater than 50% of the right lower lobe is noted. There are 2 peripheral areas of subpleural lucency in this area which may reflect areas of cavitation. Band-like area of consolidative change within the basilar right upper lobe also noted. Index nodules within the previous left lower lobe  are obscured by the new consolidative change. Within the lateral left lower lobe, there is a 4 mm lung nodule, image 114/6. Stable from previous exam. Right lower lobe lung nodules referenced on the previous exam are obscured by new consolidative change. Irregular area of scarring and architectural distortion within the medial right apex is unchanged, image 33/6. There is also new airspace consolidation and ground-glass attenuation involving the posterior and medial left lower lobe. There is a subpleural lucency in this area measuring 1.4 cm, image 103/6. This may reflect internal cavitation. There is a small right pleural effusion which is noted overlying the posterior right upper lobe, new from previous exam. SOFT TISSUES/BONES: No acute abnormality of the bones or soft tissues. UPPER ABDOMEN: The adrenal glands are normal size and morphology bilaterally. No nodule, thickening, or hemorrhage. No periadrenal stranding.  IMPRESSION: 1. New bilateral lower lobe airspace consolidation with ground-glass change and suspected cavitation, with additional band-like consolidation in the basilar right upper lobe, most consistent with necrotizing pneumonia (consider aspiration and septic emboli), with recommended short-interval follow-up chest CT in 6-8 weeks to document improvement and exclude an underlying malignant process. 2. Small right pleural effusion, new from prior exam. 3. Interval enlargement of a high-riding paratracheal lymph node and right hilar lymph node, suspicious for nodal metastatic disease versus reactive adenopathy. 4. Previously described lung nodules are partially obscured by new consolidation, limiting assessment; a 4 mm left lower lobe nodule is unchanged. Electronically signed by: Waddell Calk MD MD 01/27/2024 08:13 AM EST RP Workstation: [id number]   CT Head Wo Contrast Result Date: 01/25/2024 EXAM: CT HEAD WITHOUT CONTRAST 01/25/2024 11:37:33 AM TECHNIQUE: CT of the head was performed without the administration of intravenous contrast. Automated exposure control, iterative reconstruction, and/or weight based adjustment of the mA/kV was utilized to reduce the radiation dose to as low as reasonably achievable. COMPARISON: MRI head 12/24/2023. CLINICAL HISTORY: weakness, confusion FINDINGS: BRAIN AND VENTRICLES: There is no evidence of an acute infarct, acute intracranial hemorrhage, midline shift, hydrocephalus, or extra-axial fluid collection. There is mild cerebral atrophy. A subcentimeter hyperdense focus in the medial left frontal lobe corresponds to susceptibility/chronic blood products on multiple prior MRI examinations. Scattered hypodensities in the cerebral white matter bilaterally are nonspecific but compatible with mild chronic small vessel ischemic disease. Calcified atherosclerosis at the skull base. ORBITS: Left cataract extraction. SINUSES: Persistent near complete opacification of the left  frontal sinus. Clear mastoid air cells. SOFT TISSUES AND SKULL: Left parietal craniotomy. No acute soft tissue abnormality. No skull fracture. IMPRESSION: 1. No acute intracranial abnormality. 2. Mild chronic small vessel ischemic disease. Electronically signed by: Dasie Hamburg MD MD 01/25/2024 12:07 PM EST RP Workstation: [id number]   DG Chest 2 View Result Date: 01/25/2024 CLINICAL DATA:  Weakness, decreased appetite, dysuria. EXAM: CHEST - 2 VIEW COMPARISON:  08/11/2023 and CT chest 12/09/2023. FINDINGS: Trachea is midline. Heart size normal. New dense airspace consolidation in the right lower lobe with probable slight involvement of the right perihilar region. Tiny right pleural effusion. Biapical pleural thickening. Left lung is clear. IMPRESSION: 1. Right lower lobe pneumonia with a small right parapneumonic effusion. Followup PA and lateral chest X-ray is recommended in 3-4 weeks following trial of antibiotic therapy to ensure resolution and exclude underlying malignancy. 2. Probable mild involvement of the right perihilar region. Electronically Signed   By: Newell Eke M.D.   On: 01/25/2024 11:50   ADDENDUM: Hematology/oncology Attending: The patient is seen today.  I agree with the above note he is  currently on agonal breathing and not communicative.  He was very combative earlier today and he received Ativan  0.5 mg earlier.  He had sudden change in his condition with agonal breathing and no response to communication verbally.  The patient has stage IV non-small cell lung cancer with brain metastasis.  He underwent a course of concurrent chemoradiation as well as resection of the brain tumor.  He has good response to this treatment.  He started the first cycle of systemic chemotherapy with carboplatin , Alimta  and Keytruda  on January 12, 2024.  He was admitted to the hospital with significant shortness of breath and was found to have bilateral necrotizing pneumonia more on the right side.  He was  treated for the pneumonia but his condition has been getting worse. I discussed his case with his daughter Margit and updated her about his current status.  She is in agreement with the changing his CODE STATUS to no CODE BLUE.. We will continue with palliative care for now and transition to comfort care if no improvement in the next few hours. Thank you for taking good care of Mr. Monjaraz.  Please call if you have any questions. Disclaimer: This note was dictated with voice recognition software. Similar sounding words can inadvertently be transcribed and may be missed upon review. Sherrod MARLA Sherrod, MD

## 2024-02-02 NOTE — Code Documentation (Signed)
 RN noted patient breathing heavy, he is more confused.  5:45pm I discussed with patient's daughter over phone. She understands his dad's poor prognosis. She agreed with DNR/ DNI. She is leaning toward comfort care if his clinical condition does not improve in next day or two. She wants to continue IV fluids, antibiotics. Patient daughter also noted that he never wanted gastric tube.  Will continue supportive care. Continue NPO, supplemental O2, fall, aspiration and delirium precautions. Dr. Sherrod did speak to her earlier and she did mention the same.   CODE STATUS changed to DNR/ DNI. Total time spent on ACP discussion - .

## 2024-02-02 NOTE — Progress Notes (Signed)
" °  Radiation Oncology         [phone] ________________________________  Name: DEVARIUS NELLES MRN: [id number]  Date of Service: 02/09/2024  DOB: 1945/07/16  Post Treatment Telephone Note  Diagnosis: Progressive Stage IV, [id number], NSCLC, NOS of the RLL with brain disease   First Treatment Date: 2024-01-06 Last Treatment Date: 2024-01-06   Plan Name: Brain_SRS_dca Site: Brain [id number] Technique: SBRT/SRT-3D Mode: Photon Dose Per Fraction: 20 Gy Prescribed Dose (Delivered / Prescribed): 20 Gy / 20 Gy Prescribed Fxs (Delivered / Prescribed): 1 / 1  The patient was not available for call today. Patient is currently in the hospital and will be discharged with hospice care.                           "

## 2024-02-02 NOTE — Progress Notes (Signed)
 " Daily Progress Note   Patient Name: Noah King       Date: 02/02/2024 DOB: [birth year]  Age: 79 y.o. MRN#: [id number] Attending Physician: Darci Pore, MD Primary Care Physician: Merna Huxley, NP Admit Date: 01/25/2024 Length of Stay: 7 days  Reason for Follow-up: Establishing goals of care  Subjective:   CC: Patient more confused and slurring speech when seen.  Following up regarding goals of care.  Discussed care with hospitalist, oncology team, and RN for medical updates.  Patient becoming increasingly confused.  Patient previously believed to take medication but now medications need to be crushed and even with this he is pocketing medications and has to be instructed to times with signs of aspiration.  Patient unable to follow simple commands.  Patient having worsening paranoia.  SLP consulted for reevaluation.  Reviewed patient's I&Os and noted decreasing oral intake since 01/31/2024. Reviewed BMP from today which did not note electrolyte abnormalities or renal dysfunction.  Also reviewed CBC from today noting hemoglobin continues to be low at 9.8.  No leukocytosis noted though patient is immunocompromised with underlying metastatic cancer.  Presented to bedside to see patient.  No visitors present at bedside.  Patient lying awake in bed with left hand in mitten.  Attempted to interact with patient though overall confused.  Patient's speech is slurred as compared to yesterday.  Continue discussions with team to coordinate care.  Had already expressed recommendation to [facility] oncology and potentially neuro-oncology for recommendations.  Also concern about further workup being needed potentially for concerns of stroke versus bleed from known brain mets versus if this is all from underlying delirium though need to rule out causes before determining it is delirium.  Discussed extensively with care team.  Objective:   Vital Signs:  BP (!) 141/64 (BP Location: Right Arm)    Pulse 71   Temp 97.9 F (36.6 C) (Oral)   Resp 18   Ht 5' 10 (1.778 m)   Wt 74.9 kg   SpO2 100%   BMI 23.69 kg/m   Physical Exam: General: Agitated, awake, confused, chronically ill-appearing Cardiovascular: RRR Respiratory: no increased work of breathing noted, not in respiratory distress Extremities: Upper extremities in mittens bilaterally Abdomen: not distended  Assessment & Plan:   Assessment: Patient is a 79 year old male with a past medical history of metastatic lung cancer with mets to the brain status postresection and palliative radiation on chemotherapy, hypertension, and hyperlipidemia who was admitted on 11/14/2024 after noted generalized weakness, poor appetite, and mild confusion.  Patient's most recent chemotherapy was on 01/12/2024.  Since admission, patient receiving management of cavitary pneumonia.  Pulmonology has been following for recommendations.  Palliative medicine team consulted to assist with goals of care.  Recommendations/Plan: # Complex medical decision making/goals of care:  - Patient's mentation continuing to worsen since yesterday.  Now noting slurred speech.  Have expressed concerns to care team/hospitalist about possible workup for further medical considerations such as stroke versus acute bleed from known brain mets versus progression of brain mets, etc.; recommended input from oncology and potentially neuro-oncology.  While this could be related to delirium from patient being hospitalized and his recent acute illness of pneumonia, other causes of acute worsening need to be ruled out before deemed to be delirium.  Once further workup has been done to rule out causes that could have an actionable intervention, could then appropriately discuss goals of care with daughter as having this information would be very important to help determine pathways for  medical care moving forward. Daughter had previously expressed wishes to continue aggressive medical  interventions since prior to hospitalization patient was living alone and performing all ADLs and IADLs. Palliative medicine team continuing to follow with patient's medical journey.  -  Code Status: Full Code  # Symptom management    - Poor appetite in setting of known metastatic lung cancer; now n.p.o. with change in mentation   -Discontinue Megace  which was previously started as it is associated with an increased risk of hypercoagulability and patient with already known metastatic cancer causing risk for hypercoagulability.  Please do not order this medication for this patient in the future.   -Patient also receiving olanzapine  2.5 mg nightly as per hospitalist.  This medication can help with appetite, mood, nausea/vomiting, and sleep and cancer patients.  # Psychosocial Support:  - Daughter: Shari Melhorn who is patient's only child  # Discharge Planning: To Be Determined  Thank you for allowing the palliative care team to participate in the care Oneil C Lamba.  Tinnie Radar, DO Palliative Care Provider PMT # [phone]  If patient remains symptomatic despite maximum doses, please call PMT at [phone] between 0700 and 1900. Outside of these hours, please call attending, as PMT does not have night coverage.  Personally spent 35 minutes in patient care including extensive chart review (labs, imaging, progress/consult notes, vital signs), medically appropraite exam, discussed with treatment team, documenting clinical information, medication review and management, and coordination of care.   "

## 2024-02-02 NOTE — TOC Progression Note (Signed)
 Transition of Care [facility]) - Progression Note    Patient Details  Name: Noah King MRN: [id number] Date of Birth: 1945/01/31  Transition of [facility]) CM/SW Contact  NORMAN ASPEN, LCSW Phone Number: 02/02/2024, 3:06 PM  Clinical Narrative:     Have updated daughter that able to confirm that [facility] can offer a SNF bed IF pt not receiving any active chemo or radiation treatments.  Alerted medical team to this as well.  Unfortunately, pt with possible medical decline now and work up underway.  IP CM will continue to follow along and assist with appropriate dc planning needs.  Expected Discharge Plan: Skilled Nursing Facility Barriers to Discharge: Continued Medical Work up               Expected Discharge Plan and Services In-house Referral: Clinical Social Work   Post Acute Care Choice: Skilled Nursing Facility Living arrangements for the past 2 months: Single Family Home                 DME Arranged: N/A DME Agency: NA                   Social Drivers of Health (SDOH) Interventions SDOH Screenings   Food Insecurity: No Food Insecurity (01/25/2024)  Housing: Low Risk (01/25/2024)  Transportation Needs: No Transportation Needs (01/25/2024)  Utilities: Not At Risk (01/25/2024)  Alcohol Screen: Low Risk (11/01/2021)  Depression (PHQ2-9): Low Risk (01/12/2024)  Financial Resource Strain: Low Risk (11/01/2021)  Physical Activity: Insufficiently Active (11/27/2023)  Social Connections: Socially Isolated (01/25/2024)  Stress: No Stress Concern Present (11/27/2023)  Tobacco Use: Medium Risk (12/18/2023)    Readmission Risk Interventions     No data to display

## 2024-02-02 NOTE — Progress Notes (Signed)
 " Progress Note   Patient: Noah King FMW:[id number] DOB: 29-Dec-1945 DOA: 01/25/2024     7 DOS: the patient was seen and examined on 02/02/2024   Brief hospital course: Noah King is a 79 y.o. male with medical history significant of lung cancer with brain mets s/p resection and palliative radiation, on chemotherapy, hypertension, hyperlipidemia presented to [facility] ED via EMS for evaluation of profound generalized weakness, poor appetite, mild confusion for the past 1-2 weeks.  Recent chemo treatment on 01/12/24.  Since that time, patient has become progressively more weak and has not been eating or drinking much of anything.  He has a cough that is apparently stable and chronic for over 20 years, has not seemed to worsen recently.  No fever, chills, rigors of sore throat.  No nausea vomiting. CT head without contrast no acute findings, mild chronic small vessel ischemic disease. Chest x-ray showed right lower lobe pneumonia with small right parapneumonic effusion. Patient started on Rocephin , doxycycline  in the ED with IV hydration admitted to TRH service for further management evaluation.  Patient is started on Unasyn , doxy therapy per pulmonary consult. SLP cleared him for diet. He is very weak, eating poor, started megace . On and off confused which is new, MRI brain no new lesions. Over weekend more confused and not baseline, required IV Ativan , haldol , zyprexa . Repeat MRI brain with ativan  ordered. TOC working on SNF placement.  Assessment and Plan: * Right lower lobe pneumonia Chest x-ray showing RLL PNA with small right parapneumonic effusion.  Patient does have non-small cell lung cancer involving the RLL. CT chest revealed necrotizing lesion. Pulmonary signed off- Unasyn  changed to Augmentin  for total 14 days. He will need follow up CT chest in 4-6 weeks to ensure resolution. SLP cleared him for diet. But due to his AMS, placed NPO for now. PT/OT advised SNF. TOC working  on placement.  Acute Metabolic encephalopathy- Hospital delirium, more confused, requiring mittens. Discussed with oncology, will get repeat MRI with ativan . Continue delirium precautions. Ativan  as needed. Zyprexa  at night ordered. NPO for swallow evaluation.  Malignant neoplasm of lower lobe of right lung (HCC) With mets to brain s/p craniotomy and resection of left parietal lesion, with new 3mm left front lobe lesion on MRI 12/23/24, currently on palliative radiation. Follows with Dr. Gatha and Buckley.  Last chemo on 01/12/24.   MRI brain motion abnormalities, no new lesions. Repeat MRI brain pending. Palliative Care on board.   Hypokalemia- Improved with supplements ordered.  Essential hypertension BP lower side. Continue to hold home losartan  for now, resume when BP warrants.  GERD (gastroesophageal reflux disease) Continue PPI  Hyperlipidemia Plan to resume statin on discharge.  Pancytopenia- Possibly from chemotherapy. Counts improved s/p filgrastim  Continue to trend CBC.  Generalized weakness Anorexia Severe calorie malnutrition- Failure to thrive He is eating poor.  NPO due to his altered mentation, risk of aspiration. PT/ OT advised SNF placement, awaiting bed placement.     Out of bed to chair. Incentive spirometry. Nursing supportive care. Fall, aspiration precautions. Diet:  Diet Orders (From admission, onward)     Start     Ordered   02/02/24 0904  Diet NPO time specified  Diet effective now        02/02/24 9096           DVT prophylaxis: enoxaparin  (LOVENOX ) injection 40 mg Start: 01/25/24 2200  Level of care: Med-Surg   Code Status: Full Code  Subjective: Patient is seen and examined  today morning. He is more confused today, mumbling, unable to follow commands. Has mittens. High risk for aspiration.  Physical Exam: Vitals:   02/01/24 1358 02/01/24 2020 02/02/24 0404 02/02/24 1419  BP: 136/63 (!) 154/66 (!) 141/64 (!) 149/75   Pulse: 87 (!) 110 71 (!) 106  Resp: 18 20 18    Temp: 98.5 F (36.9 C) 99.4 F (37.4 C) 97.9 F (36.6 C) 98.7 F (37.1 C)  TempSrc: Oral Oral Oral   SpO2: 90% 99% 100% 92%  Weight:      Height:        General - Elderly weak Caucasian male, confused, restless HEENT - PERRLA, EOMI, atraumatic head, non tender sinuses. Lung - Clear, basal rales, diffuse rhonchi, no wheezes. Heart - S1, S2 heard, no murmurs, rubs, trace pedal edema. Abdomen - Soft, non tender, bowel sounds good Neuro - Alert, confused, unable to follow commands, non focal exam. Skin - Warm and dry.  Data Reviewed:      Latest Ref Rng & Units 02/02/2024    4:26 AM 02/01/2024    1:38 PM 01/30/2024    5:26 AM  CBC  WBC 4.0 - 10.5 K/uL 5.4  3.9  6.5   Hemoglobin 13.0 - 17.0 g/dL 9.8  89.3  8.9   Hematocrit 39.0 - 52.0 % 27.9  29.8  25.3   Platelets 150 - 400 K/uL 242  242  160       Latest Ref Rng & Units 02/02/2024    4:26 AM 02/01/2024    1:38 PM 02/01/2024    5:13 AM  BMP  Glucose 70 - 99 mg/dL 91  99    BUN 8 - 23 mg/dL 5  5    Creatinine 9.38 - 1.24 mg/dL 9.35  9.38  9.40   Sodium 135 - 145 mmol/L 136  131    Potassium 3.5 - 5.1 mmol/L 3.8  3.1    Chloride 98 - 111 mmol/L 99  96    CO2 22 - 32 mmol/L 27  25    Calcium  8.9 - 10.3 mg/dL 8.5  8.6     No results found.   Family Communication: Discussed with daughter over phone, she understands and agrees. All questions answered.    Disposition: Status is: Inpatient Remains inpatient appropriate because: antibiotics, weakness, confused, NPO, placement.  Planned Discharge Destination: Skilled nursing facility     Time spent: 51 minutes  Author: Concepcion Riser, MD 02/02/2024 2:56 PM Secure chat 7am to 7pm For on call review [email or web address].    "

## 2024-02-03 DIAGNOSIS — D61810 Antineoplastic chemotherapy induced pancytopenia: Secondary | ICD-10-CM | POA: Diagnosis not present

## 2024-02-03 DIAGNOSIS — E785 Hyperlipidemia, unspecified: Secondary | ICD-10-CM | POA: Diagnosis not present

## 2024-02-03 DIAGNOSIS — K219 Gastro-esophageal reflux disease without esophagitis: Secondary | ICD-10-CM | POA: Diagnosis not present

## 2024-02-03 DIAGNOSIS — G9341 Metabolic encephalopathy: Secondary | ICD-10-CM | POA: Diagnosis not present

## 2024-02-03 DIAGNOSIS — E43 Unspecified severe protein-calorie malnutrition: Secondary | ICD-10-CM | POA: Diagnosis not present

## 2024-02-03 DIAGNOSIS — R531 Weakness: Secondary | ICD-10-CM | POA: Diagnosis not present

## 2024-02-03 DIAGNOSIS — I10 Essential (primary) hypertension: Secondary | ICD-10-CM | POA: Diagnosis not present

## 2024-02-03 DIAGNOSIS — C3431 Malignant neoplasm of lower lobe, right bronchus or lung: Secondary | ICD-10-CM | POA: Diagnosis not present

## 2024-02-03 DIAGNOSIS — G9340 Encephalopathy, unspecified: Secondary | ICD-10-CM | POA: Diagnosis not present

## 2024-02-03 DIAGNOSIS — J189 Pneumonia, unspecified organism: Secondary | ICD-10-CM | POA: Diagnosis not present

## 2024-02-03 DIAGNOSIS — R630 Anorexia: Secondary | ICD-10-CM | POA: Diagnosis not present

## 2024-02-03 DIAGNOSIS — Z515 Encounter for palliative care: Secondary | ICD-10-CM | POA: Diagnosis not present

## 2024-02-03 LAB — BASIC METABOLIC PANEL WITH GFR
Anion gap: 14 (ref 5–15)
BUN: 9 mg/dL (ref 8–23)
CO2: 20 mmol/L — ABNORMAL LOW (ref 22–32)
Calcium: 8.6 mg/dL — ABNORMAL LOW (ref 8.9–10.3)
Chloride: 99 mmol/L (ref 98–111)
Creatinine, Ser: 0.57 mg/dL — ABNORMAL LOW (ref 0.61–1.24)
GFR, Estimated: >60 mL/min
Glucose, Bld: 117 mg/dL — ABNORMAL HIGH (ref 70–99)
Potassium: 4.6 mmol/L (ref 3.5–5.1)
Sodium: 133 mmol/L — ABNORMAL LOW (ref 135–145)

## 2024-02-03 LAB — CBC
HCT: 31.9 % — ABNORMAL LOW (ref 39.0–52.0)
Hemoglobin: 10.9 g/dL — ABNORMAL LOW (ref 13.0–17.0)
MCH: 30.4 pg (ref 26.0–34.0)
MCHC: 34.2 g/dL (ref 30.0–36.0)
MCV: 89.1 fL (ref 80.0–100.0)
Platelets: 273 K/uL (ref 150–400)
RBC: 3.58 MIL/uL — ABNORMAL LOW (ref 4.22–5.81)
RDW: 14.3 % (ref 11.5–15.5)
WBC: 6.5 K/uL (ref 4.0–10.5)
nRBC: 0 % (ref 0.0–0.2)

## 2024-02-03 MED ORDER — SODIUM CHLORIDE 0.9 % IV SOLN
3.0000 g | Freq: Four times a day (QID) | INTRAVENOUS | Status: DC
  Administered 2024-02-03 – 2024-02-05 (×8): 3 g via INTRAVENOUS
  Filled 2024-02-03 (×8): qty 8

## 2024-02-03 NOTE — Progress Notes (Signed)
 "                                                                                                                                                                                                          Daily Progress Note   Patient Name: Noah King       Date: 02/03/2024 DOB: 11-Jun-1945  Age: 79 y.o. MRN#: [id number] Attending Physician: Darci Pore, MD Primary Care Physician: Merna Huxley, NP Admit Date: 01/25/2024  Reason for Consultation/Follow-up: Establishing goals of care  Subjective:  Resting in bed, appears weak.   Length of Stay: 8  Current Medications: Scheduled Meds:   amoxicillin -clavulanate  1 tablet Oral Q12H   atorvastatin   20 mg Oral Daily   enoxaparin  (LOVENOX ) injection  40 mg Subcutaneous Q24H   feeding supplement  1 Container Oral TID WC   feeding supplement  237 mL Oral BID BM   folic acid   1 mg Oral Daily   guaiFENesin   600 mg Oral BID   magnesium  oxide  400 mg Oral BID   OLANZapine   2.5 mg Oral QHS   pantoprazole   40 mg Oral Daily   potassium chloride   40 mEq Oral Daily   scopolamine   1 patch Transdermal Q72H    Continuous Infusions:  dextrose  5 % and 0.9 % NaCl with KCl 20 mEq/L 40 mL/hr at 02/03/24 0459    PRN Meds: acetaminophen  **OR** acetaminophen , albuterol , LORazepam , prochlorperazine , senna-docusate  Physical Exam         Awake but confused No distress Appears chronically ill Abdomen not distended  Vital Signs: BP 102/63 (BP Location: Left Arm)   Pulse (!) 101   Temp (!) 97.5 F (36.4 C) (Oral)   Resp (!) 22   Ht 5' 10 (1.778 m)   Wt 74.9 kg   SpO2 94%   BMI 23.69 kg/m  SpO2: SpO2: 94 % O2 Device: O2 Device: Room Air O2 Flow Rate:    Intake/output summary:  Intake/Output Summary (Last 24 hours) at 02/03/2024 1351 Last data filed at 02/03/2024 1000 Gross per 24 hour  Intake 602.67 ml  Output 1150 ml  Net -547.33 ml   LBM: Last BM Date : 02/02/24 Baseline Weight: Weight: 74.9 kg Most recent weight:  Weight: 74.9 kg       Palliative Assessment/Data:      Patient Active Problem List   Diagnosis Date Noted   Poor appetite 02/02/2024   Protein-calorie malnutrition, severe 01/30/2024   Encephalopathy 01/29/2024   Neutropenia 01/28/2024   Malignant neoplasm metastatic to brain (HCC) 01/27/2024  Counseling and coordination of care 01/27/2024   Palliative care encounter 01/27/2024   Pancytopenia due to chemotherapy 01/27/2024   Right lower lobe pneumonia 01/25/2024   GERD (gastroesophageal reflux disease) 01/25/2024   Metastasis to brain (HCC) 12/29/2023   Goals of care, counseling/discussion 12/24/2023   Malignant neoplasm of lower lobe of right lung (HCC) 09/10/2023   S/P craniotomy 08/21/2023   Pulmonary nodules 08/21/2023   Mediastinal lymphadenopathy 08/13/2023   Herpes zoster 12/13/2009   Polycythemia vera (HCC) 04/16/2007   Contracture of palmar fascia 04/16/2007   Hypothyroidism 08/29/2006   Hyperlipidemia 08/29/2006   Essential hypertension 08/29/2006    Palliative Care Assessment & Plan   Patient Profile: Symptom management   High aspiration risk, poor oral intake, progressive weakness, and delirium place patient at high risk for further decompensation. Plan: Continue comfort-oriented symptom management, delirium precautions, and nursing support. Avoid escalation of care inconsistent with established goals.  Goals of care / advance care planning Patient lacks decision-making capacity. Goals of care reviewed with TRH and oncology and family - CODE STATUS updated to DNR/DNI (no CODE BLUE). Plan: Continue palliative care support. Focus on symptom relief, dignity, and minimizing non-beneficial interventions. Re-engage family in the coming days to discuss transition to comfort measures only should decline persist.  Stage IV non-small cell lung adenocarcinoma with brain metastases Diagnosed 08/2023. Treated with craniotomy, SRS, concurrent chemoradiation, and recently  initiated systemic therapy (carboplatin , pemetrexed , pembrolizumab  on 01/12/2024). Hospital course complicated by bilateral necrotizing pneumonia with progressive functional and cognitive decline despite treatment. Oncology has discussed prognosis and current clinical deterioration with the patients daughter, who is in agreement with limiting aggressive interventions. Plan: Prognosis is poor given advanced metastatic disease, treatment-refractory decline, and current inability to participate in care. Recommend continued palliative involvement with close reassessment for transition to comfort-focused care if no meaningful improvement.  Altered mental status with neurocognitive decline 79 year old male with stage IV non-small cell lung cancer with known brain metastases, status post left craniotomy and stereotactic radiosurgery, now with progressive encephalopathy. Mental status initially appeared to improve but has acutely worsened today with inability to follow commands, medication pocketing/spitting, paranoia, and failure to safely swallow. MRI brain (12/24/2023) demonstrated a 3 mm enhancing left frontal lesion; repeat MRI on 01/30/2024 was significantly limited by motion but showed no interval change in chronic microhemorrhages. Clinical picture concerning for irreversible neurologic decline in the setting of advanced malignancy and systemic illness. Plan: Supportive, comfort-focused neurologic management. Avoid burdensome diagnostics unlikely to change management. SLP recommendations followed; patient remains NPO except ice chips due to high aspiration risk.     Goals of Care and Additional Recommendations: Limitations on Scope of Treatment: Full Comfort Care  Code Status:    Code Status Orders  (From admission, onward)           Start     Ordered   02/02/24 1735  Do not attempt resuscitation (DNR) - Comfort care  (Code Status)  Continuous       Question Answer Comment  If patient has  no pulse and is not breathing Do Not Attempt Resuscitation   In Pre-Arrest Conditions (Patient Is Breathing and Has a Pulse) Provide comfort measures. Relieve any mechanical airway obstruction. Avoid transfer unless required for comfort.   Consent: Discussion documented in EHR or advanced directives reviewed      02/02/24 1736           Code Status History     Date Active Date Inactive Code Status Order ID Comments User  Context   02/02/2024 1731 02/02/2024 1736 Limited: Do not attempt resuscitation (DNR) -DNR-LIMITED -Do Not Intubate/DNI  [id number]  Darci Pore, MD Inpatient   01/25/2024 1356 02/02/2024 1731 Full Code [id number]  Fausto Burnard LABOR, DO ED   08/21/2023 1856 08/23/2023 2147 Full Code [id number]  Johnanna Camie Mates, PA-C Inpatient   08/13/2023 1038 08/15/2023 1650 Full Code [id number]  Verdene Purchase, MD Inpatient      Advance Directive Documentation    Flowsheet Row Most Recent Value  Type of Advance Directive Living will, Healthcare Power of Attorney  Pre-existing out of facility DNR order (yellow form or pink MOST form) --  MOST Form in Place? --    Prognosis:  Unable to determine  Discharge Planning: To Be Determined  Care plan was discussed with  IDT  Thank you for allowing the Palliative Medicine Team to assist in the care of this patient.  I personally spent a total of 35 minutes in the care of the patient today including preparing to see the patient, getting/reviewing separately obtained history, performing a medically appropriate exam/evaluation, independently interpreting results, and coordinating care.      Greater than 50%  of this time was spent counseling and coordinating care related to the above assessment and plan.  Lonia Serve, MD  Please contact Palliative Medicine Team phone at [phone] for questions and concerns.       "

## 2024-02-03 NOTE — Evaluation (Signed)
 Clinical/Bedside Swallow Evaluation Patient Details  Name: Noah King MRN: [id number] Date of Birth: 1945-03-24  Today's Date: 02/03/2024 Time: SLP Start Time (ACUTE ONLY): 1128 SLP Stop Time (ACUTE ONLY): 1140 SLP Time Calculation (min) (ACUTE ONLY): 12 min  Past Medical History:  Past Medical History:  Diagnosis Date   Allergy    Cataract    left  extraction- right eye forming   Dupuytren's contracture    GERD (gastroesophageal reflux disease)    Hiatal hernia    past hx-  suspect at one time   Hypertension    Polycythemia    Past Surgical History:  Past Surgical History:  Procedure Laterality Date   APPLICATION OF CRANIAL NAVIGATION Left 08/21/2023   Procedure: COMPUTER-ASSISTED NAVIGATION, FOR CRANIAL PROCEDURE;  Surgeon: Debby Dorn MATSU, MD;  Location: [facility] OR;  Service: Neurosurgery;  Laterality: Left;   cataract extraction left eye     COLONOSCOPY     CRANIOTOMY Left 08/21/2023   Procedure: CRANIOTOMY TUMOR EXCISION;  Surgeon: Debby Dorn MATSU, MD;  Location: [facility] OR;  Service: Neurosurgery;  Laterality: Left;  LT CRANI FOR TUMOR RESECTION   DUPUYTREN CONTRACTURE RELEASE Left    HERNIA REPAIR  2024   POLYPECTOMY     rt knee arthroscopic     VIDEO BRONCHOSCOPY WITH ENDOBRONCHIAL ULTRASOUND Right 08/13/2023   Procedure: BRONCHOSCOPY, WITH EBUS;  Surgeon: Jude Harden GAILS, MD;  Location: [facility] ENDOSCOPY;  Service: Cardiopulmonary;  Laterality: Right;   HPI:  Noah King is a 79 yo male who presented to [facility] on 01/25/2024 due to poor PO intake, mild confusion and generalized weakness. Pt found to have RLL pna, onset was rapid. (CXR 2view RLL pna). MBS 1/14 WFL but SLP reconsulted 1/19 due to change in mentation and respiraty status with repeat CXR showing increasing or new R perihilar and RLL airspace disease with a new small R pleural effusion. PMH includes but is not limited to lung ca with brain mets s/p resection and palliative radiation and on chemotherapy, HTN,  HLD, HTN, hyperlipidemia and GERD. Pt was followed by SLP for cognitive tx during July/Aug admission.    Assessment / Plan / Recommendation  Clinical Impression  Will repeat an MBS as mentation and respiratory status allow to assess swallowing given clinical change. Continue NPO with the exception of ice chips in moderation and meds crushed in puree.   Pt presents with altered mentation and increased WOB in comparison to initial swallowing evaluation 1/13. He follows commands and uses social language appropriately but needs cueing for pacing. With assistance, he had sips of water and bites of a fruit cup that were followed by multiple swallows and immediate coughing.This appears to be a significant change compared last week.   SLP Visit Diagnosis: Dysphagia, unspecified (R13.10)    Aspiration Risk  Moderate aspiration risk    Diet Recommendation           Other Recommendations Oral Care Recommendations: Oral care QID;Oral care prior to ice chip/H20     Swallow Evaluation Recommendations Recommendations: NPO except meds;Ice chips PRN after oral care Medication Administration: Crushed with puree Oral care recommendations: Oral care QID (4x/day);Oral care before ice chips/water   Assistance Recommended at Discharge    Functional Status Assessment Patient has had a recent decline in their functional status and demonstrates the ability to make significant improvements in function in a reasonable and predictable amount of time.  Frequency and Duration min 2x/week  2 weeks       Prognosis Prognosis  for improved oropharyngeal function: Good Barriers to Reach Goals: Time post onset      Swallow Study   General HPI: Noah King is a 79 yo male who presented to [facility] on 01/25/2024 due to poor PO intake, mild confusion and generalized weakness. Pt found to have RLL pna, onset was rapid. (CXR 2view RLL pna). MBS 1/14 WFL but SLP reconsulted 1/19 due to change in mentation and respiraty  status with repeat CXR showing increasing or new R perihilar and RLL airspace disease with a new small R pleural effusion. PMH includes but is not limited to lung ca with brain mets s/p resection and palliative radiation and on chemotherapy, HTN, HLD, HTN, hyperlipidemia and GERD. Pt was followed by SLP for cognitive tx during July/Aug admission. Type of Study: Bedside Swallow Evaluation Previous Swallow Assessment: see HPI Diet Prior to this Study: NPO Temperature Spikes Noted: No Respiratory Status: Nasal cannula History of Recent Intubation: No Behavior/Cognition: Alert;Cooperative;Requires cueing Oral Cavity Assessment: Within Functional Limits Oral Care Completed by SLP: No Oral Cavity - Dentition: Adequate natural dentition Vision: Functional for self-feeding Self-Feeding Abilities: Needs assist Patient Positioning: Upright in bed Baseline Vocal Quality: Normal Volitional Cough: Strong Volitional Swallow: Able to elicit    Oral/Motor/Sensory Function Overall Oral Motor/Sensory Function: Within functional limits   Ice Chips Ice chips: Not tested   Thin Liquid Thin Liquid: Impaired Presentation: Straw Pharyngeal  Phase Impairments: Multiple swallows;Cough - Immediate    Nectar Thick Nectar Thick Liquid: Not tested   Honey Thick Honey Thick Liquid: Not tested   Puree Puree: Within functional limits Presentation: Spoon   Solid     Solid: Impaired Presentation: Spoon Pharyngeal Phase Impairments: Multiple swallows;Cough - Immediate      Damien Blumenthal, M.A., CCC-SLP Speech Language Pathology, Acute Rehabilitation Services  Secure Chat preferred [phone]  02/03/2024,11:31 AM

## 2024-02-03 NOTE — Progress Notes (Signed)
 " Progress Note   Patient: Noah King FMW:[id number] DOB: Feb 18, 1945 DOA: 01/25/2024     8 DOS: the patient was seen and examined on 02/03/2024   Brief hospital course: Noah King is a 79 y.o. male with medical history significant of lung cancer with brain mets s/p resection and palliative radiation, on chemotherapy, hypertension, hyperlipidemia presented to [facility] ED via EMS for evaluation of profound generalized weakness, poor appetite, mild confusion for the past 1-2 weeks.  Recent chemo treatment on 01/12/24.  Since that time, patient has become progressively more weak and has not been eating or drinking much of anything.  He has a cough that is apparently stable and chronic for over 20 years, has not seemed to worsen recently.  No fever, chills, rigors of sore throat.  No nausea vomiting. CT head without contrast no acute findings, mild chronic small vessel ischemic disease. Chest x-ray showed right lower lobe pneumonia with small right parapneumonic effusion. Patient started on Rocephin , doxycycline  in the ED with IV hydration admitted to TRH service for further management evaluation.  Patient is started on Unasyn , doxy therapy per pulmonary consult. SLP cleared him for diet. He is very weak, eating poor, started megace . On and off confused which is new, MRI brain no new lesions. Over weekend more confused and not baseline, required IV Ativan , haldol , zyprexa . His respirations labored, mental status worrisome, he is made n.p.o. discussed with daughter CODE STATUS changed to DNR.  Assessment and Plan: * Right lower lobe pneumonia Chest x-ray showing RLL PNA with small right parapneumonic effusion.  Patient does have non-small cell lung cancer involving the RLL. CT chest revealed necrotizing lesion. Pulmonary signed off-resume Unasyn  as he is n.p.o. Due to his worsening mentation we will get repeat swallow evaluation. Patient has deteriorated, poor functional ability he will  need skilled nursing facility pending further clinical course.  Acute Metabolic encephalopathy- Hospital delirium, more confused, requiring mittens. Unable to get a repeat MRI brain.  Will order once more stable. Continue delirium precautions. Avoid benzos if possible. NPO for swallow evaluation.  Malignant neoplasm of lower lobe of right lung (HCC) With mets to brain s/p craniotomy and resection of left parietal lesion, with new 3mm left front lobe lesion on MRI 12/23/24, currently on palliative radiation. Follows with Dr. Gatha and Buckley.  Last chemo on 01/12/24.   MRI brain motion abnormalities, no new lesions. Repeat MRI brain pending. Palliative Care on board.  Patient is made DNR/DNI.  Hypokalemia- Improved with supplements ordered.  Essential hypertension BP lower side. Continue to hold home losartan  for now, resume when BP warrants.  GERD (gastroesophageal reflux disease) Continue PPI  Hyperlipidemia Plan to resume statin on discharge.  Pancytopenia- Possibly from chemotherapy. Counts improved s/p filgrastim  Continue to trend CBC.  Generalized weakness Anorexia Severe calorie malnutrition- Failure to thrive NPO due to his altered mentation, risk of aspiration.     Out of bed to chair. Incentive spirometry. Nursing supportive care. Fall, aspiration precautions. Diet:  Diet Orders (From admission, onward)     Start     Ordered   02/02/24 0904  Diet NPO time specified  Diet effective now        02/02/24 9096           DVT prophylaxis: enoxaparin  (LOVENOX ) injection 40 mg Start: 01/25/24 2200  Level of care: Med-Surg   Code Status: Do not attempt resuscitation (DNR) - Comfort care  Subjective: Patient is seen and examined today morning.  Today he  is more alert, able to converse. Has mittens.  Continues to be n.p.o. due to high risk for aspiration.  No family at bedside  Physical Exam: Vitals:   02/02/24 1711 02/02/24 2116 02/03/24 0607 02/03/24  0940  BP: (!) 149/77 (!) 147/84 106/60 102/63  Pulse: (!) 102 (!) 108 (!) 104 (!) 101  Resp:  20 (!) 22   Temp:  97.8 F (36.6 C) 97.8 F (36.6 C) (!) 97.5 F (36.4 C)  TempSrc:  Oral Oral Oral  SpO2: 93% 90% 96% 94%  Weight:      Height:        General - Elderly weak Caucasian male, no apparent distress. HEENT - PERRLA, EOMI, atraumatic head, non tender sinuses. Lung - Clear, basal rales, diffuse rhonchi, no wheezes. Heart - S1, S2 heard, no murmurs, rubs, trace pedal edema. Abdomen - Soft, non tender, bowel sounds good Neuro - Alert, able to follow commands, non focal exam. Skin - Warm and dry.  Data Reviewed:      Latest Ref Rng & Units 02/03/2024    4:56 AM 02/02/2024    4:26 AM 02/01/2024    1:38 PM  CBC  WBC 4.0 - 10.5 K/uL 6.5  5.4  3.9   Hemoglobin 13.0 - 17.0 g/dL 89.0  9.8  89.3   Hematocrit 39.0 - 52.0 % 31.9  27.9  29.8   Platelets 150 - 400 K/uL 273  242  242       Latest Ref Rng & Units 02/03/2024    4:56 AM 02/02/2024    4:26 AM 02/01/2024    1:38 PM  BMP  Glucose 70 - 99 mg/dL 882  91  99   BUN 8 - 23 mg/dL 9  5  5    Creatinine 0.61 - 1.24 mg/dL 9.42  9.35  9.38   Sodium 135 - 145 mmol/L 133  136  131   Potassium 3.5 - 5.1 mmol/L 4.6  3.8  3.1   Chloride 98 - 111 mmol/L 99  99  96   CO2 22 - 32 mmol/L 20  27  25    Calcium  8.9 - 10.3 mg/dL 8.6  8.5  8.6    DG CHEST PORT 1 VIEW Result Date: 02/02/2024 EXAM: 1 VIEW(S) XRAY OF THE CHEST 02/02/2024 03:22:00 PM COMPARISON: Chest x-ray 01/25/2024 and chest CT 01/26/2024. CLINICAL HISTORY: Hypoxia. FINDINGS: LUNGS AND PLEURA: Increasing/new right perihilar airspace disease. Increasing right lower lung airspace disease. Small right pleural effusion appears new from prior. New opacification in the right lung apex, which may represent fluid. No other airspace disease is noted in the right lung. The left lung is clear. No pneumothorax. HEART AND MEDIASTINUM: No acute abnormality of the cardiac and mediastinal  silhouettes. BONES AND SOFT TISSUES: No acute osseous abnormality. IMPRESSION: 1. Increasing or new right perihilar and right lower lung airspace disease. 2. New right apical opacification, which may represent fluid. 3. New small right pleural effusion. Electronically signed by: Greig Pique MD 02/02/2024 07:26 PM EST RP Workstation: [id number]     Family Communication: Discussed with daughter over phone, she understands and agrees. All questions answered.    Disposition: Status is: Inpatient Remains inpatient appropriate because: antibiotics, altered mentation, weakness, NPO, placement.  Planned Discharge Destination: Skilled nursing facility     Time spent: 52 minutes  Author: Concepcion Riser, MD 02/03/2024 6:06 PM Secure chat 7am to 7pm For on call review [email or web address].    "

## 2024-02-03 NOTE — Plan of Care (Signed)

## 2024-02-03 NOTE — Progress Notes (Signed)
 PT Cancellation Note  Patient Details Name: Noah King MRN: [id number] DOB: 12/28/1945   Cancelled Treatment:    Reason Eval/Treat Not Completed: Other (comment) (comfort care). Per RN, pt is now comfort care. Will sign off at this time, please re-consult if needs arise.   Tori Jaquez Farrington PT, DPT 02/03/24, 2:22 PM

## 2024-02-03 NOTE — Progress Notes (Signed)
 OT Cancellation Note  Patient Details Name: Noah King MRN: [id number] DOB: 10-30-1945   Cancelled Treatment:    Reason Eval/Treat Not Completed: Other (comment). Discussed with RN. Pt transitioned to comfort care. No further skilled OT needs at this time, OT to sign off. Thank you for the referral.  Delon L. Haiden Clucas, OTR/L  02/03/24, 2:23 PM

## 2024-02-04 DIAGNOSIS — I10 Essential (primary) hypertension: Secondary | ICD-10-CM | POA: Diagnosis not present

## 2024-02-04 DIAGNOSIS — C3431 Malignant neoplasm of lower lobe, right bronchus or lung: Secondary | ICD-10-CM | POA: Diagnosis not present

## 2024-02-04 DIAGNOSIS — R531 Weakness: Secondary | ICD-10-CM | POA: Diagnosis not present

## 2024-02-04 DIAGNOSIS — R630 Anorexia: Secondary | ICD-10-CM

## 2024-02-04 DIAGNOSIS — J189 Pneumonia, unspecified organism: Secondary | ICD-10-CM | POA: Diagnosis not present

## 2024-02-04 MED ORDER — HALOPERIDOL LACTATE 5 MG/ML IJ SOLN
5.0000 mg | Freq: Four times a day (QID) | INTRAMUSCULAR | Status: DC | PRN
  Administered 2024-02-04 – 2024-02-05 (×3): 5 mg via INTRAVENOUS
  Filled 2024-02-04 (×3): qty 1

## 2024-02-04 MED ORDER — HYDROMORPHONE HCL 1 MG/ML IJ SOLN
1.0000 mg | INTRAMUSCULAR | Status: DC | PRN
  Administered 2024-02-04: 1 mg via INTRAVENOUS
  Filled 2024-02-04 (×2): qty 1

## 2024-02-04 NOTE — Progress Notes (Addendum)
 Speech Language Pathology Treatment: Dysphagia  Patient Details Name: Noah King MRN: [id number] DOB: 07/12/1945 Today's Date: 02/04/2024 Time: 9064-9044 SLP Time Calculation (min) (ACUTE ONLY): 20 min  Assessment / Plan / Recommendation Clinical Impression  PLAN: Resume regular diet/thin liquid, to allow full range of PO choices. Meds as tolerated. High aspiration risk. Palliative Care MD discussed with pt's daughter 02/03/24 re: allowing for comfort feeds and proceeding with home with hospice arrangements. No instrumental assessment desired at this time.  Pt seen at bedside to assess appropriateness for PO intake. Pt awake and alert, pleasant and cooperative. Pt completed oral care after set up. Pt then accepted trials of ice chips, thin liquid via straw, and puree consistencies. He declined trial of graham cracker. Timely oral prep and clearing noted. Intermittent throat clear after the swallow, but no overt s/s aspiration or strong reflexive cough. Pt is at increased risk of dysphagia and aspiration with alteration in mentation. Proceed with PO intake considering alertness/confusion. Given code status and GOC established with MD/family, will resume regular diet with thin liquids. No MBS. Discussed with medical team. ST signing off at this time. Please reconsult if needs arise.    HPI HPI: Noah King is a 79 yo male who presented to [facility] on 01/25/2024 due to poor PO intake, mild confusion and generalized weakness. Pt found to have RLL pna, onset was rapid. (CXR 2view RLL pna). MBS 1/14 WFL but SLP reconsulted 1/19 due to change in mentation and respiraty status with repeat CXR showing increasing or new R perihilar and RLL airspace disease with a new small R pleural effusion. PMH includes but is not limited to lung ca with brain mets s/p resection and palliative radiation and on chemotherapy, HTN, HLD, HTN, hyperlipidemia and GERD. Pt was followed by SLP for cognitive tx during July/Aug  admission.      SLP Plan  All goals met        Swallow Evaluation Recommendations    Comfort feeds with known aspiration risk     Recommendations  Diet recommendations: Regular;Thin liquid Liquids provided via: Cup;Straw Medication Administration: Other (Comment) (as tolerated) Supervision: Full supervision/cueing for compensatory strategies;Staff to assist with self feeding Compensations: Minimize environmental distractions;Slow rate;Small sips/bites Postural Changes and/or Swallow Maneuvers: Seated upright 90 degrees;Upright 30-60 min after meal        Oral care QID   Frequent or constant Supervision/Assistance Dysphagia, unspecified (R13.10)     All goals met    Egidio Lofgren B. Dory, MSP, CCC-SLP Speech Language Pathologist  Dory Caprice Daring 02/04/2024, 10:10 AM

## 2024-02-04 NOTE — Progress Notes (Signed)
 Noah King [address] [facility] Liaison Note  Received request from [facility] [place] for hospice services at home after discharge. Spoke with pts daughter Margit to initiate education related to hospice philosophy, services, and team approach to care. She verbalized understanding of information given. Per discussion, the plan is for discharge home by [facility]  on 1.22.2026.  DME needs discussed. Patient has a wheelchair and bedside commode in the home. Family requests the following equipment for delivery: hospital bed, overbed table and 02.  The address has been verified and is correct in the chart. Margit is the family contact to arrange time of equipment delivery.  Please send signed and completed DNR home with patient/family. Please provide prescriptions at discharge as needed to ensure ongoing symptom management.   [facility] information and contact numbers given to [place]. Above information shared with ICM [place]. Please call with any questions or concerns.   Thank you for the opportunity to participate in this patient's care.   Amy Darien BSN, RN [facility] Liaison [phone]

## 2024-02-04 NOTE — Progress Notes (Signed)
 Nutrition Brief Note  Chart reviewed. Pt now transitioning to comfort care.  No further nutrition interventions planned at this time.    Morna Lee, MS, RD, LDN Inpatient Clinical Dietitian Contact via Secure chat

## 2024-02-04 NOTE — TOC Progression Note (Addendum)
 Transition of Care [facility]) - Progression Note    Patient Details  Name: Noah King MRN: [id number] Date of Birth: 1945/07/12  [facility]) CM/SW Contact  Heather DELENA Saltness, LCSW Phone Number: 02/04/2024, 11:55 AM  Clinical Narrative:     ADDENDUM  CSW met with pt's daughter, Shari Huck, and other family/friends to discuss home hospice and provide family with list of hospice agencies. Pt's daughter chooses [facility]. Referral made to hospital liaison, Amy Guerette. TOC will continue to follow.  Per palliative care team, pt to be discharging home with hospice services. CSW will speak with pt's daughter, Shari Moffatt [phone], to obtain preferred hospice agency.   Expected Discharge Plan: Skilled Nursing Facility Barriers to Discharge: Continued Medical Work up   Expected Discharge Plan and Services In-house Referral: Clinical Social Work   Post Acute Care Choice: Skilled Nursing Facility Living arrangements for the past 2 months: Single Family Home                 DME Arranged: N/A DME Agency: NA                   Social Drivers of Health (SDOH) Interventions SDOH Screenings   Food Insecurity: No Food Insecurity (01/25/2024)  Housing: Low Risk (01/25/2024)  Transportation Needs: No Transportation Needs (01/25/2024)  Utilities: Not At Risk (01/25/2024)  Alcohol Screen: Low Risk (11/01/2021)  Depression (PHQ2-9): Low Risk (01/12/2024)  Financial Resource Strain: Low Risk (11/01/2021)  Physical Activity: Insufficiently Active (11/27/2023)  Social Connections: Socially Isolated (01/25/2024)  Stress: No Stress Concern Present (11/27/2023)  Tobacco Use: Medium Risk (02/02/2024)    Readmission Risk Interventions     No data to display          Signed: Heather Saltness, MSW, LCSW Clinical Social Worker Inpatient Care Management 02/04/2024 11:57 AM

## 2024-02-04 NOTE — Progress Notes (Signed)
 "                                                                                                                                                                                                          Daily Progress Note   Patient Name: Noah King       Date: 02/04/2024 DOB: 05/23/1945  Age: 79 y.o. MRN#: [id number] Attending Physician: Darci Pore, MD Primary Care Physician: Merna Huxley, NP Admit Date: 01/25/2024  Reason for Consultation/Follow-up: Establishing goals of care  Subjective:  Resting in bed, agitated currently, verbalizes some, attempts to get out of bed, daughter, ex-wife and other friends/family at bedside.   Length of Stay: 9  Current Medications: Scheduled Meds:   atorvastatin   20 mg Oral Daily   enoxaparin  (LOVENOX ) injection  40 mg Subcutaneous Q24H   feeding supplement  1 Container Oral TID WC   feeding supplement  237 mL Oral BID BM   folic acid   1 mg Oral Daily   guaiFENesin   600 mg Oral BID   magnesium  oxide  400 mg Oral BID   OLANZapine   2.5 mg Oral QHS   pantoprazole   40 mg Oral Daily   potassium chloride   40 mEq Oral Daily   scopolamine   1 patch Transdermal Q72H    Continuous Infusions:  ampicillin -sulbactam (UNASYN ) IV 3 g (02/04/24 1009)    PRN Meds: acetaminophen  **OR** acetaminophen , albuterol , haloperidol  lactate, HYDROmorphone  (DILAUDID ) injection, LORazepam , prochlorperazine , senna-docusate  Physical Exam         Awake but confused mild distress Appears chronically ill Abdomen not distended Agitated.   Vital Signs: BP 116/69 (BP Location: Left Arm)   Pulse (!) 101   Temp 99.5 F (37.5 C) (Oral)   Resp 19   Ht 5' 10 (1.778 m)   Wt 74.9 kg   SpO2 94%   BMI 23.69 kg/m  SpO2: SpO2: 94 % O2 Device: O2 Device: Room Air O2 Flow Rate:    Intake/output summary:  Intake/Output Summary (Last 24 hours) at 02/04/2024 1347 Last data filed at 02/04/2024 1007 Gross per 24 hour  Intake 621.33 ml  Output 100 ml  Net  521.33 ml   LBM: Last BM Date : 02/04/24 Baseline Weight: Weight: 74.9 kg Most recent weight: Weight: 74.9 kg       Palliative Assessment/Data:      Patient Active Problem List   Diagnosis Date Noted   Poor appetite 02/02/2024   Protein-calorie malnutrition, severe 01/30/2024   Encephalopathy 01/29/2024   Neutropenia 01/28/2024   Malignant neoplasm metastatic to brain (  HCC) 01/27/2024   Counseling and coordination of care 01/27/2024   Palliative care encounter 01/27/2024   Pancytopenia due to chemotherapy 01/27/2024   Right lower lobe pneumonia 01/25/2024   GERD (gastroesophageal reflux disease) 01/25/2024   Metastasis to brain (HCC) 12/29/2023   Goals of care, counseling/discussion 12/24/2023   Malignant neoplasm of lower lobe of right lung (HCC) 09/10/2023   S/P craniotomy 08/21/2023   Pulmonary nodules 08/21/2023   Mediastinal lymphadenopathy 08/13/2023   Herpes zoster 12/13/2009   Polycythemia vera (HCC) 04/16/2007   Contracture of palmar fascia 04/16/2007   Hypothyroidism 08/29/2006   Hyperlipidemia 08/29/2006   Essential hypertension 08/29/2006    Palliative Care Assessment & Plan   Patient Profile: Symptom management   High aspiration risk, poor oral intake, progressive weakness, and delirium place patient at high risk for further decompensation. Plan: Continue comfort-oriented symptom management, delirium precautions, and nursing support. Avoid escalation of care inconsistent with established goals. Haldol  IV PRN. Add IV Dilaudid  PRN pain.   Goals of care / advance care planning Patient lacks decision-making capacity. Goals of care reviewed with daughter and ex wife at bedside, DNR DNI, home with hospice, focus on symptom relief and minimize non beneficial interventions.     Stage IV non-small cell lung adenocarcinoma with brain metastases Diagnosed 08/2023. Treated with craniotomy, SRS, concurrent chemoradiation, and recently initiated systemic therapy  (carboplatin , pemetrexed , pembrolizumab  on 01/12/2024). Hospital course complicated by bilateral necrotizing pneumonia with progressive functional and cognitive decline despite treatment. Oncology has discussed prognosis and current clinical deterioration with the patients daughter, who is in agreement with limiting aggressive interventions. Plan: Prognosis is poor given advanced metastatic disease, treatment-refractory decline, and current inability to participate in care. Recommend home with hospice.   Altered mental status with neurocognitive decline 79 year old male with stage IV non-small cell lung cancer with known brain metastases, status post left craniotomy and stereotactic radiosurgery, now with progressive encephalopathy. Mental status initially appeared to improve but has acutely worsened today with inability to follow commands, medication pocketing/spitting, paranoia, and failure to safely swallow. MRI brain (12/24/2023) demonstrated a 3 mm enhancing left frontal lesion; repeat MRI on 01/30/2024 was significantly limited by motion but showed no interval change in chronic microhemorrhages. Clinical picture concerning for irreversible neurologic decline in the setting of advanced malignancy and systemic illness. Plan: Supportive, comfort-focused neurologic management. Avoid burdensome diagnostics unlikely to change management. SLP recommendations followed; patient to have comfort feeds, family recognizes the patient's high aspiration risk.plan is for home with hospice.    Goals of Care and Additional Recommendations: Limitations on Scope of Treatment: Full Comfort Care  Code Status:    Code Status Orders  (From admission, onward)           Start     Ordered   02/02/24 1735  Do not attempt resuscitation (DNR) - Comfort care  (Code Status)  Continuous       Question Answer Comment  If patient has no pulse and is not breathing Do Not Attempt Resuscitation   In Pre-Arrest Conditions  (Patient Is Breathing and Has a Pulse) Provide comfort measures. Relieve any mechanical airway obstruction. Avoid transfer unless required for comfort.   Consent: Discussion documented in EHR or advanced directives reviewed      02/02/24 1736           Code Status History     Date Active Date Inactive Code Status Order ID Comments User Context   02/02/2024 1731 02/02/2024 1736 Limited: Do not attempt resuscitation (DNR) -DNR-LIMITED -  Do Not Intubate/DNI  [id number]  Darci Pore, MD Inpatient   01/25/2024 1356 02/02/2024 1731 Full Code [id number]  Fausto Burnard LABOR, DO ED   08/21/2023 1856 08/23/2023 2147 Full Code [id number]  Johnanna Camie Dolores DEVONNA Inpatient   08/13/2023 1038 08/15/2023 1650 Full Code [id number]  Verdene Purchase, MD Inpatient      Advance Directive Documentation    Flowsheet Row Most Recent Value  Type of Advance Directive Living will, Healthcare Power of Attorney  Pre-existing out of facility DNR order (yellow form or pink MOST form) --  MOST Form in Place? --    Prognosis:  < 3 months  Discharge Planning: Home with Hospice  Care plan was discussed with  RN TRH MD TOC daughter and ex wife at bedside.   Thank you for allowing the Palliative Medicine Team to assist in the care of this patient.  I personally spent a total of 50 minutes in the care of the patient today including preparing to see the patient, getting/reviewing separately obtained history, performing a medically appropriate exam/evaluation, independently interpreting results, and coordinating care.      Greater than 50%  of this time was spent counseling and coordinating care related to the above assessment and plan.  Lonia Serve, MD  Please contact Palliative Medicine Team phone at [phone] for questions and concerns.       "

## 2024-02-04 NOTE — Progress Notes (Signed)
 " Progress Note   Patient: Noah King FMW:[id number] DOB: May 12, 1945 DOA: 01/25/2024     9 DOS: the patient was seen and examined on 02/04/2024   Brief hospital course: Noah King is a 79 y.o. male with medical history significant of lung cancer with brain mets s/p resection and palliative radiation, on chemotherapy, hypertension, hyperlipidemia presented to [facility] ED via EMS for evaluation of profound generalized weakness, poor appetite, mild confusion for the past 1-2 weeks.  Recent chemo treatment on 01/12/24.  Since that time, patient has become progressively more weak and has not been eating or drinking much of anything.  He has a cough that is apparently stable and chronic for over 20 years, has not seemed to worsen recently.  No fever, chills, rigors of sore throat.  No nausea vomiting. CT head without contrast no acute findings, mild chronic small vessel ischemic disease. Chest x-ray showed right lower lobe pneumonia with small right parapneumonic effusion. Patient started on Rocephin , doxycycline  in the ED with IV hydration admitted to [facility] service for further management evaluation.  Patient is started on Unasyn , doxy therapy per pulmonary consult. SLP cleared him for diet. He is very weak, eating poor, started megace . On and off confused which is new, MRI brain no new lesions. Over weekend more confused and not baseline, required IV Ativan , haldol , zyprexa . His respirations labored, mental status worrisome, he is made n.p.o. discussed with daughter CODE STATUS changed to DNR.  Assessment and Plan: * Right lower lobe pneumonia Chest x-ray showing RLL PNA with small right parapneumonic effusion.  Patient does have non-small cell lung cancer involving the RLL. CT chest revealed necrotizing lesion.  Finish Unasyn  therapy while in hospital SLP advised regular diet as he is comfort care only. Patient has deteriorated, has poor prognosis.  Daughter understands and chose comfort  measures only.  Acute Metabolic encephalopathy- Delirium in the setting of brain mets. Ativan , Haldol  as needed. Continue delirium precautions.  Malignant neoplasm of lower lobe of right lung (HCC) With mets to brain s/p craniotomy and resection of left parietal lesion, with new 3mm left front lobe lesion on MRI 12/23/24, currently on palliative radiation. Follows with Dr. Gatha and Buckley.  Last chemo on 01/12/24.   MRI brain motion abnormalities, no new lesions. Repeat MRI unable to be performed. Palliative Care on board.  Discussed with patient's daughter who agreed with DNR comfort measures only.  Hypokalemia-improved  Essential hypertension BP lower side.  Caution with blood pressure medications.  GERD (gastroesophageal reflux disease) Continue PPI  Hyperlipidemia Plan to resume statin on discharge.  Pancytopenia- Possibly from chemotherapy. Counts improved s/p filgrastim  Continue to trend CBC.  Generalized weakness Anorexia Severe calorie malnutrition- Failure to thrive. Poor prognosis. Discharge plan Home with hospice.  DME to be arranged by TOC.     Out of bed to chair. Incentive spirometry. Nursing supportive care. Fall, aspiration precautions. Diet:  Diet Orders (From admission, onward)     Start     Ordered   02/04/24 1006  Diet regular Room service appropriate? Yes with Assist; Fluid consistency: Thin  Diet effective now       Question Answer Comment  Room service appropriate? Yes with Assist   Fluid consistency: Thin      02/04/24 1006           DVT prophylaxis: enoxaparin  (LOVENOX ) injection 40 mg Start: 01/25/24 2200  Level of care: Med-Surg   Code Status: Do not attempt resuscitation (DNR) - Comfort care  Subjective: Patient is seen and examined today morning.  He is alert, awake though unable to converse properly.  No family at bedside.  RN noted on and off agitation.  Physical Exam: Vitals:   02/03/24 0940 02/03/24 2025 02/04/24  0544 02/04/24 1352  BP: 102/63 115/64 116/69 130/82  Pulse: (!) 101 (!) 105 (!) 101 (!) 101  Resp:  20 19 18   Temp: (!) 97.5 F (36.4 C) 97.8 F (36.6 C) 99.5 F (37.5 C) 97.6 F (36.4 C)  TempSrc: Oral Oral Oral Oral  SpO2: 94% 96% 94% 94%  Weight:      Height:        General - Elderly weak Caucasian male, no apparent distress. HEENT - PERRLA, EOMI, atraumatic head, non tender sinuses. Lung - Clear, basal rales, diffuse rhonchi, no wheezes. Heart - S1, S2 heard, no murmurs, rubs, trace pedal edema. Abdomen - Soft, non tender, bowel sounds good Neuro - Alert, confused, non focal exam. Skin - Warm and dry.  Data Reviewed:      Latest Ref Rng & Units 02/03/2024    4:56 AM 02/02/2024    4:26 AM 02/01/2024    1:38 PM  CBC  WBC 4.0 - 10.5 K/uL 6.5  5.4  3.9   Hemoglobin 13.0 - 17.0 g/dL 89.0  9.8  89.3   Hematocrit 39.0 - 52.0 % 31.9  27.9  29.8   Platelets 150 - 400 K/uL 273  242  242       Latest Ref Rng & Units 02/03/2024    4:56 AM 02/02/2024    4:26 AM 02/01/2024    1:38 PM  BMP  Glucose 70 - 99 mg/dL 882  91  99   BUN 8 - 23 mg/dL 9  5  5    Creatinine 0.61 - 1.24 mg/dL 9.42  9.35  9.38   Sodium 135 - 145 mmol/L 133  136  131   Potassium 3.5 - 5.1 mmol/L 4.6  3.8  3.1   Chloride 98 - 111 mmol/L 99  99  96   CO2 22 - 32 mmol/L 20  27  25    Calcium  8.9 - 10.3 mg/dL 8.6  8.5  8.6    DG CHEST PORT 1 VIEW Result Date: 02/02/2024 EXAM: 1 VIEW(S) XRAY OF THE CHEST 02/02/2024 03:22:00 PM COMPARISON: Chest x-ray 01/25/2024 and chest CT 01/26/2024. CLINICAL HISTORY: Hypoxia. FINDINGS: LUNGS AND PLEURA: Increasing/new right perihilar airspace disease. Increasing right lower lung airspace disease. Small right pleural effusion appears new from prior. New opacification in the right lung apex, which may represent fluid. No other airspace disease is noted in the right lung. The left lung is clear. No pneumothorax. HEART AND MEDIASTINUM: No acute abnormality of the cardiac and  mediastinal silhouettes. BONES AND SOFT TISSUES: No acute osseous abnormality. IMPRESSION: 1. Increasing or new right perihilar and right lower lung airspace disease. 2. New right apical opacification, which may represent fluid. 3. New small right pleural effusion. Electronically signed by: Greig Pique MD 02/02/2024 07:26 PM EST RP Workstation: [id number]     Family Communication: Palliative, TOC discussed with daughter.  Plan to send him to home with hospice.  Awaiting DME arrangements at home.  Disposition: Status is: Inpatient Remains inpatient appropriate because: antibiotics, altered mentation, weakness, DME arrangements at home  Planned Discharge Destination: Home with hospice     Time spent: 47 minutes  Author: Concepcion Riser, MD 02/04/2024 2:52 PM Secure chat 7am to 7pm For on call review [email or web address].    "

## 2024-02-05 ENCOUNTER — Other Ambulatory Visit (HOSPITAL_COMMUNITY): Payer: Self-pay

## 2024-02-05 ENCOUNTER — Encounter: Payer: Self-pay | Admitting: Internal Medicine

## 2024-02-05 ENCOUNTER — Telehealth (HOSPITAL_COMMUNITY): Payer: Self-pay

## 2024-02-05 DIAGNOSIS — J189 Pneumonia, unspecified organism: Secondary | ICD-10-CM | POA: Diagnosis not present

## 2024-02-05 DIAGNOSIS — R630 Anorexia: Secondary | ICD-10-CM | POA: Diagnosis not present

## 2024-02-05 DIAGNOSIS — D61810 Antineoplastic chemotherapy induced pancytopenia: Secondary | ICD-10-CM | POA: Diagnosis not present

## 2024-02-05 DIAGNOSIS — C3431 Malignant neoplasm of lower lobe, right bronchus or lung: Secondary | ICD-10-CM | POA: Diagnosis not present

## 2024-02-05 DIAGNOSIS — G9341 Metabolic encephalopathy: Secondary | ICD-10-CM

## 2024-02-05 MED ORDER — HALOPERIDOL 5 MG PO TABS
5.0000 mg | ORAL_TABLET | ORAL | 0 refills | Status: DC | PRN
  Filled 2024-02-05: qty 10, 2d supply, fill #0

## 2024-02-05 MED ORDER — SENNOSIDES 8.6 MG PO TABS
2.0000 | ORAL_TABLET | Freq: Two times a day (BID) | ORAL | 0 refills | Status: DC
  Filled 2024-02-05: qty 28, 7d supply, fill #0

## 2024-02-05 MED ORDER — PROCHLORPERAZINE MALEATE 10 MG PO TABS
10.0000 mg | ORAL_TABLET | ORAL | 0 refills | Status: DC | PRN
  Filled 2024-02-05: qty 42, 7d supply, fill #0

## 2024-02-05 MED ORDER — SCOPOLAMINE 1 MG/3DAYS TD PT72
1.0000 | MEDICATED_PATCH | TRANSDERMAL | 0 refills | Status: DC
  Filled 2024-02-05: qty 10, 30d supply, fill #0

## 2024-02-05 MED ORDER — MORPHINE SULFATE (CONCENTRATE) 20 MG/ML PO SOLN
10.0000 mg | ORAL | 0 refills | Status: DC | PRN
  Filled 2024-02-05: qty 30, 10d supply, fill #0

## 2024-02-05 MED ORDER — LORAZEPAM 0.5 MG PO TABS
0.5000 mg | ORAL_TABLET | ORAL | 0 refills | Status: DC | PRN
  Filled 2024-02-05: qty 42, 11d supply, fill #0

## 2024-02-05 NOTE — TOC Transition Note (Incomplete)
 Transition of Care [facility]) - Discharge Note   Patient Details  Name: Noah King MRN: [id number] Date of Birth: 17-Sep-1945  [facility] CM/SW Contact:  Heather DELENA Saltness, LCSW Phone Number: 02/05/2024, 1:19 PM   Clinical Narrative:    Pt discharging home today with hospice services through [facility]. D/C packet with signed DNR placed in pt's chart at RN station. CSW confirmed with daughter and hospice liaison, Amy, of DME being delivered. PTAR called [phone]. Pt's daughter, Shari Scotto [phone], made aware and in agreement with discharge planning. No further TOC needs at this time.   Final next level of care: Home w Hospice Care Barriers to Discharge: Barriers Resolved   Patient Goals and CMS Choice Patient states their goals for this hospitalization and ongoing recovery are:: To return home with hospice CMS Medicare.gov Compare Post Acute Care list provided to:: Patient Represenative (must comment) Choice offered to / list presented to : Adult Children [place] ownership interest in [facility].provided to:: Adult Children    Discharge Placement Home  Patient to be transferred to facility by: PTAR Name of family member notified: Shari Maddux Patient and family notified of of transfer: 02/05/24  Discharge Plan and Services Additional resources added to the After Visit Summary for  Hospice In-house Referral: Clinical Social Work   Post Acute Care Choice: Skilled Nursing Facility          DME Arranged: Hospital bed, Oxygen, Overbed table DME Agency: [facility] Date DME Agency Contacted: 02/04/24 Time DME Agency Contacted: 1256 Representative spoke with at DME Agency: Amy HH Arranged: NA HH Agency: NA        Social Drivers of Health (SDOH) Interventions SDOH Screenings   Food Insecurity: No Food Insecurity (01/25/2024)  Housing: Low Risk (01/25/2024)  Transportation Needs: No Transportation Needs  (01/25/2024)  Utilities: Not At Risk (01/25/2024)  Alcohol Screen: Low Risk (11/01/2021)  Depression (PHQ2-9): Low Risk (01/12/2024)  Financial Resource Strain: Low Risk (11/01/2021)  Physical Activity: Insufficiently Active (11/27/2023)  Social Connections: Socially Isolated (01/25/2024)  Stress: No Stress Concern Present (11/27/2023)  Tobacco Use: Medium Risk (02/02/2024)     Readmission Risk Interventions     No data to display

## 2024-02-05 NOTE — Discharge Summary (Signed)
 " Physician Discharge Summary   Patient: Noah King MRN: [id number] DOB: 11-Jun-1945  Admit date:     01/25/2024  Discharge date: 02/05/2024  Discharge Physician: Concepcion Riser   PCP: Merna Huxley, NP   Recommendations at discharge:    Hospice care only.  Discharge Diagnoses: Principal Problem:   Right lower lobe pneumonia Active Problems:   Essential hypertension   Malignant neoplasm of lower lobe of right lung (HCC)   Hyperlipidemia   GERD (gastroesophageal reflux disease)   Malignant neoplasm metastatic to brain [facility])   Counseling and coordination of care   Palliative care encounter   Pancytopenia due to chemotherapy   Neutropenia   Encephalopathy   Protein-calorie malnutrition, severe   Poor appetite  Resolved Problems:   * No resolved hospital problems. *  Hospital Course: Noah King is a 79 y.o. male with medical history significant of lung cancer with brain mets s/p resection and palliative radiation, on chemotherapy, hypertension, hyperlipidemia presented to [facility] ED via EMS for evaluation of profound generalized weakness, poor appetite, mild confusion for the past 1-2 weeks.  Recent chemo treatment on 01/12/24.  Since that time, patient has become progressively more weak and has not been eating or drinking much of anything.  He has a cough that is apparently stable and chronic for over 20 years, has not seemed to worsen recently.  No fever, chills, rigors of sore throat.  No nausea vomiting. CT head without contrast no acute findings, mild chronic small vessel ischemic disease. Chest x-ray showed right lower lobe pneumonia with small right parapneumonic effusion. Patient started on Rocephin , doxycycline  in the ED with IV hydration admitted to [facility] service for further management evaluation.   Patient is started on Unasyn , doxy therapy per pulmonary consult. SLP cleared him for diet. He is very weak, eating poor, started megace . On and off  confused which is new, MRI brain no new lesions. Over weekend more confused and not baseline, required IV Ativan , haldol , zyprexa . His respirations labored, mental status worrisome, he is made n.p.o. discussed with daughter CODE STATUS changed to DNR. Palliative team discussed with daughter and she chose home with hospice care, which is arranged, DME equipment delivered.   Assessment and Plan:  Right lower lobe pneumonia Chest x-ray showing RLL PNA with small right parapneumonic effusion.  Patient does have non-small cell lung cancer involving the RLL. He is at high risk for aspiration.  CT chest revealed necrotizing lesion.  Patient got Unasyn  therapy and augmentin  while in hospital. His condition deteriorated. Daughter agreed with DNR and later comfort care only. SLP advised regular diet as he is comfort care only.   Acute Metabolic encephalopathy- Delirium in the setting of brain mets. Mental status poor. Ativan , Haldol  as needed prescribed.   Malignant neoplasm of lower lobe of right lung (HCC) With mets to brain s/p craniotomy and resection of left parietal lesion, with new 3mm left front lobe lesion on MRI 12/23/24, currently on palliative radiation. Follows with Dr. Gatha and [facility].  Last chemo on 01/12/24.   MRI brain motion abnormalities, no new lesions. Repeat MRI unable to be performed. Palliative Care on board.  Discussed with patient's daughter who agreed with DNR. Discharge plan home with hospice services arranged.   Hypokalemia-improved   Essential hypertension BP lower side.  stopped blood pressure medications.   GERD (gastroesophageal reflux disease) Continue PPI   Hyperlipidemia Stopped statin, as he is hospice only.   Pancytopenia- Possibly from chemotherapy.  Generalized weakness Anorexia  Severe calorie malnutrition- Failure to thrive. Poor prognosis. Discharge plan Home with hospice.  DME arranged by TOC.       Consultants: Oncology, Pulmonary,  palliative. Procedures performed: none  Disposition: Hospice care Diet recommendation:  Regular diet DISCHARGE MEDICATION: Allergies as of 02/05/2024       Reactions   Lotensin  [benazepril  Hcl] Cough        Medication List     STOP taking these medications    atorvastatin  20 MG tablet Commonly known as: LIPITOR   docusate sodium  100 MG capsule Commonly known as: COLACE   folic acid  1 MG tablet Commonly known as: FOLVITE    losartan  25 MG tablet Commonly known as: COZAAR    Mens 50+ Multivitamin Tabs   pantoprazole  40 MG tablet Commonly known as: PROTONIX        TAKE these medications    haloperidol  5 MG tablet Commonly known as: HALDOL  Take 1 tablet (5 mg total) by mouth every 4 (four) hours as needed for agitation. May crush, mix with water and give sublingually if needed.   LORazepam  0.5 MG tablet Commonly known as: ATIVAN  Take 1 tablet (0.5 mg total) by mouth every 4 (four) hours as needed for anxiety. May crush, mix with water and give sublingually if needed.   morphine  20 MG/ML concentrated solution Commonly known as: ROXANOL Take 0.5 mLs (10 mg total) by mouth every 4 (four) hours as needed for severe pain (pain score 7-10). May give sublingually if needed.   prochlorperazine  10 MG tablet Commonly known as: COMPAZINE  Take 1 tablet (10 mg total) by mouth every 4 (four) hours as needed for nausea or vomiting. May crush, mix with water and give sublingually. What changed:  when to take this reasons to take this additional instructions   scopolamine  1 MG/3DAYS Commonly known as: TRANSDERM-SCOP Place 1 patch (1 mg total) onto the skin every 3 (three) days.   senna 8.6 MG tablet Commonly known as: Senokot Take 2 tablets (17.2 mg total) by mouth 2 (two) times daily. May crush, mix with water and give sublingually if needed.        Discharge Exam: Filed Weights   01/29/24 2030  Weight: 74.9 kg      02/05/2024    5:10 AM 02/04/2024    8:41 PM  02/04/2024    1:52 PM  Vitals with BMI  Systolic 141 108 869  Diastolic 87 75 82  Pulse 84 110 101   General - Elderly weak confused Caucasian male, no apparent distress. HEENT - PERRLA, EOMI, atraumatic head, non tender sinuses. Lung - Clear, basal rales, diffuse rhonchi, no wheezes. Heart - S1, S2 heard, no murmurs, rubs, trace pedal edema. Abdomen - Soft, non tender, bowel sounds good Neuro - Alert, confused, non focal exam. Skin - Warm and dry.  Condition at discharge: poor  The results of significant diagnostics from this hospitalization (including imaging, microbiology, ancillary and laboratory) are listed below for reference.   Imaging Studies: DG CHEST PORT 1 VIEW Result Date: 02/02/2024 EXAM: 1 VIEW(S) XRAY OF THE CHEST 02/02/2024 03:22:00 PM COMPARISON: Chest x-ray 01/25/2024 and chest CT 01/26/2024. CLINICAL HISTORY: Hypoxia. FINDINGS: LUNGS AND PLEURA: Increasing/new right perihilar airspace disease. Increasing right lower lung airspace disease. Small right pleural effusion appears new from prior. New opacification in the right lung apex, which may represent fluid. No other airspace disease is noted in the right lung. The left lung is clear. No pneumothorax. HEART AND MEDIASTINUM: No acute abnormality of the cardiac and mediastinal  silhouettes. BONES AND SOFT TISSUES: No acute osseous abnormality. IMPRESSION: 1. Increasing or new right perihilar and right lower lung airspace disease. 2. New right apical opacification, which may represent fluid. 3. New small right pleural effusion. Electronically signed by: Greig Pique MD 02/02/2024 07:26 PM EST RP Workstation: [id number]   MR BRAIN W WO CONTRAST Result Date: 01/30/2024 CLINICAL DATA:  Follow-up brain metastases, lung cancer EXAM: MRI HEAD WITHOUT AND WITH CONTRAST TECHNIQUE: Multiplanar, multiecho pulse sequences of the brain and surrounding structures were obtained without and with intravenous contrast. CONTRAST:  7mL GADAVIST   GADOBUTROL  1 MMOL/ML IV SOLN COMPARISON:  December 24, 2023 FINDINGS: MRI brain: Previous small left parietal craniotomy for lesion resection. Small underlying area of encephalomalacia without enhancement. There are multifocal areas of magnetic susceptibility in the brain parenchyma involving the posterior fossa and both hemispheres No abnormal enhancement. The small medial left frontal lesion is not visible due to motion. No other lesions are identified. There is no acute or chronic infarct. The ventricles are normal. There are normal flow signals in the carotid arteries and basilar artery. No significant bone marrow signal abnormality. No significant abnormality in the paranasal sinuses or soft tissues. IMPRESSION: Previous resection of left parietal lesion. There is significant motion on the postcontrast images. There are no enhancing lesions identified, but small lesions would not be visible on this study given the degree of motion. The medial left frontal lobe and left hippocampal lesions noted on the prior study would not be visible on this study given the degree of motion. Multiple chronic microhemorrhages are unchanged compared with the prior study. Electronically Signed   By: Nancyann Burns M.D.   On: 01/30/2024 09:22   DG Swallowing Func-Speech Pathology Result Date: 01/28/2024 Table formatting from the original result was not included. Modified Barium Swallow Study Patient Details Name: Noah King MRN: [id number] Date of Birth: 16-Feb-1945 Today's Date: 01/28/2024 HPI/PMH: HPI: Noah King is a 79 yo male who presented [facility]2024 due to poor PO intake, mild confusion and generalized weakness. Pt found to have RLL pna, onset was rapid. (CXR 2view RLL pna). PMH includes but is not limited to lung ca with brain mets s/p resection and palliative radiation and on chemotherapy, HTN, HLD, HTN, hyperlipidemia and GERD. Pt was followed by SLP for cognitive tx during July/Aug admission. Clinical  Impression: Clinical Impression: Pt presented with functional oropharyngeal swallowing with no penetration nor aspiration, despite being taxed with large volumes of thin liquids. There was mildly decreased mobility of the larynx and the tip of the epiglottis did not completely invert, however, airway closure was reliable. The decreased epiglottic inversion led to some trapping of thicker solids above the epiglottis.  There was no back flow of material from the esophagus.  The 13 mm pill lodged briefly lateral to the epiglottis and again at the LES, but passed without incident. Recommend pt continue regular solids/thin liquids.  Called his daughter and we discussed results/recs, including benefit of intensive oral care to help keep infection at bay in case there are occasional incidents of aspiration now or in the future. She agreed with recs.  No SLP f/u is needed. Factors that may increase risk of adverse event in presence of aspiration [name] & Lianne 2021): Factors that may increase risk of adverse event in presence of aspiration [name] & Lianne 2021): Respiratory or GI disease; Poor general health and/or compromised immunity; Frail or deconditioned Recommendations/Plan: Swallowing Evaluation Recommendations Swallowing Evaluation Recommendations Recommendations: PO diet PO Diet  Recommendation: Regular; Thin liquids (Level 0) Liquid Administration via: Straw; Cup Medication Administration: Whole meds with liquid Supervision: Patient able to self-feed Oral care recommendations: Oral care QID (4x/day) Treatment Plan Treatment Plan Follow-up recommendations: No SLP follow up Recommendations Recommendations for follow up therapy are one component of a multi-disciplinary discharge planning process, led by the attending physician.  Recommendations may be updated based on patient status, additional functional criteria and insurance authorization. Assessment: Orofacial Exam: Orofacial Exam Oral Cavity: Oral Hygiene:  WFL Oral Cavity - Dentition: Adequate natural dentition Orofacial Anatomy: WFL Oral Motor/Sensory Function: WFL Anatomy: Anatomy: WFL Boluses Administered: Boluses Administered Boluses Administered: Thin liquids (Level 0); Mildly thick liquids (Level 2, nectar thick); Puree; Solid  Oral Impairment Domain: Oral Impairment Domain Lip Closure: No labial escape Tongue control during bolus hold: Cohesive bolus between tongue to palatal seal Bolus preparation/mastication: Slow prolonged chewing/mashing with complete recollection Bolus transport/lingual motion: Brisk tongue motion Oral residue: Complete oral clearance Location of oral residue : N/A Initiation of pharyngeal swallow : Posterior angle of the ramus  Pharyngeal Impairment Domain: Pharyngeal Impairment Domain Soft palate elevation: No bolus between soft palate (SP)/pharyngeal wall (PW) Laryngeal elevation: Partial superior movement of thyroid  cartilage/partial approximation of arytenoids to epiglottic petiole Anterior hyoid excursion: Partial anterior movement Epiglottic movement: Partial inversion Laryngeal vestibule closure: Complete, no air/contrast in laryngeal vestibule Pharyngeal stripping wave : Present - complete Pharyngeal contraction (A/P view only): N/A Pharyngoesophageal segment opening: Partial distention/partial duration, partial obstruction of flow Tongue base retraction: Trace column of contrast or air between tongue base and PPW Pharyngeal residue: Collection of residue within or on pharyngeal structures Location of pharyngeal residue: Valleculae  Esophageal Impairment Domain: Esophageal Impairment Domain Esophageal clearance upright position: Complete clearance, esophageal coating Pill: Pill Consistency administered: Thin liquids (Level 0) Thin liquids (Level 0): [facility] Penetration/Aspiration Scale Score: Penetration/Aspiration Scale Score 1.  Material does not enter airway: Thin liquids (Level 0); Mildly thick liquids (Level 2, nectar thick);  Puree; Solid; Pill Compensatory Strategies: Compensatory Strategies Compensatory strategies: No   General Information: Caregiver present: No  Diet Prior to this Study: Regular; Thin liquids (Level 0)   Temperature : Normal   Respiratory Status: WFL   Supplemental O2: None (Room air)   History of Recent Intubation: No  Behavior/Cognition: Alert; Cooperative; Pleasant mood Self-Feeding Abilities: Able to self-feed Baseline vocal quality/speech: Normal Volitional Cough: Able to elicit Volitional Swallow: Able to elicit Exam Limitations: No limitations Goal Planning: No data recorded No data recorded No data recorded No data recorded Consulted and agree with results and recommendations: Patient Pain: Pain Assessment Pain Assessment: No/denies pain Faces Pain Scale: 0 End of Session: Start Time:SLP Start Time (ACUTE ONLY): 1223 Stop Time: SLP Stop Time (ACUTE ONLY): 1236 Time Calculation:SLP Time Calculation (min) (ACUTE ONLY): 13 min Charges: SLP Evaluations $ SLP Speech Visit: 1 Visit SLP Evaluations $BSS Swallow: 1 Procedure $MBS Swallow: 1 Procedure SLP visit diagnosis: SLP Visit Diagnosis: Dysphagia, unspecified (R13.10) Past Medical History: Past Medical History: Diagnosis Date  Allergy   Cataract   left  extraction- right eye forming  Dupuytren's contracture   GERD (gastroesophageal reflux disease)   Hiatal hernia   past hx-  suspect at one time  Hypertension   Polycythemia  Past Surgical History: Past Surgical History: Procedure Laterality Date  APPLICATION OF CRANIAL NAVIGATION Left 08/21/2023  Procedure: COMPUTER-ASSISTED NAVIGATION, FOR CRANIAL PROCEDURE;  Surgeon: Debby Dorn MATSU, MD;  Location: [facility] OR;  Service: Neurosurgery;  Laterality: Left;  cataract extraction left eye  COLONOSCOPY    CRANIOTOMY Left 08/21/2023  Procedure: CRANIOTOMY TUMOR EXCISION;  Surgeon: Debby Dorn MATSU, MD;  Location: [facility] OR;  Service: Neurosurgery;  Laterality: Left;  LT CRANI FOR TUMOR RESECTION  DUPUYTREN CONTRACTURE  RELEASE Left   HERNIA REPAIR  2024  POLYPECTOMY    rt knee arthroscopic    VIDEO BRONCHOSCOPY WITH ENDOBRONCHIAL ULTRASOUND Right 08/13/2023  Procedure: BRONCHOSCOPY, WITH EBUS;  Surgeon: Jude Harden GAILS, MD;  Location: [facility] ENDOSCOPY;  Service: Cardiopulmonary;  Laterality: Right; Vona Palma Laurice 01/28/2024, 1:02 PM Palma L. Vona, MA CCC/SLP Clinical Specialist - Acute Care SLP Acute Rehabilitation Services Office number [phone]  CT CHEST W CONTRAST Result Date: 01/27/2024 EXAM: CT CHEST WITH CONTRAST 01/26/2024 06:05:00 PM TECHNIQUE: CT of the chest was performed with the administration of intravenous contrast. Multiplanar reformatted images are provided for review. Automated exposure control, iterative reconstruction, and/or weight based adjustment of the mA/kV was utilized to reduce the radiation dose to as low as reasonably achievable. COMPARISON: 12/09/2023 CLINICAL HISTORY: Restaging non-small cell lung cancer. FINDINGS: MEDIASTINUM: Aortic atherosclerosis and coronary artery calcifications. No pericardial effusion. High right paratracheal lymph node is increased in size from the previous exam, measuring 9 mm. Previously 5 mm. The previous right hilar lymph node measures 1.4 cm, image 81/2. Previously 1.1 cm. LYMPH NODES: High-riding paratracheal lymph node is increased in size from the previous exam, measuring 9 mm. Previously 5 mm. The previous right hilar lymph node measures 1.4 cm, image 81/2. Previously 1.1 cm. LUNGS AND PLEURA: New airspace consolidation involving greater than 50% of the right lower lobe is noted. There are 2 peripheral areas of subpleural lucency in this area which may reflect areas of cavitation. Band-like area of consolidative change within the basilar right upper lobe also noted. Index nodules within the previous left lower lobe are obscured by the new consolidative change. Within the lateral left lower lobe, there is a 4 mm lung nodule, image 114/6. Stable from  previous exam. Right lower lobe lung nodules referenced on the previous exam are obscured by new consolidative change. Irregular area of scarring and architectural distortion within the medial right apex is unchanged, image 33/6. There is also new airspace consolidation and ground-glass attenuation involving the posterior and medial left lower lobe. There is a subpleural lucency in this area measuring 1.4 cm, image 103/6. This may reflect internal cavitation. There is a small right pleural effusion which is noted overlying the posterior right upper lobe, new from previous exam. SOFT TISSUES/BONES: No acute abnormality of the bones or soft tissues. UPPER ABDOMEN: The adrenal glands are normal size and morphology bilaterally. No nodule, thickening, or hemorrhage. No periadrenal stranding. IMPRESSION: 1. New bilateral lower lobe airspace consolidation with ground-glass change and suspected cavitation, with additional band-like consolidation in the basilar right upper lobe, most consistent with necrotizing pneumonia (consider aspiration and septic emboli), with recommended short-interval follow-up chest CT in 6-8 weeks to document improvement and exclude an underlying malignant process. 2. Small right pleural effusion, new from prior exam. 3. Interval enlargement of a high-riding paratracheal lymph node and right hilar lymph node, suspicious for nodal metastatic disease versus reactive adenopathy. 4. Previously described lung nodules are partially obscured by new consolidation, limiting assessment; a 4 mm left lower lobe nodule is unchanged. Electronically signed by: Waddell Calk MD MD 01/27/2024 08:13 AM EST RP Workstation: [id number]   CT Head Wo Contrast Result Date: 01/25/2024 EXAM: CT HEAD WITHOUT CONTRAST 01/25/2024 11:37:33 AM TECHNIQUE: CT of the head was performed without the  administration of intravenous contrast. Automated exposure control, iterative reconstruction, and/or weight based adjustment of the  mA/kV was utilized to reduce the radiation dose to as low as reasonably achievable. COMPARISON: MRI head 12/24/2023. CLINICAL HISTORY: weakness, confusion FINDINGS: BRAIN AND VENTRICLES: There is no evidence of an acute infarct, acute intracranial hemorrhage, midline shift, hydrocephalus, or extra-axial fluid collection. There is mild cerebral atrophy. A subcentimeter hyperdense focus in the medial left frontal lobe corresponds to susceptibility/chronic blood products on multiple prior MRI examinations. Scattered hypodensities in the cerebral white matter bilaterally are nonspecific but compatible with mild chronic small vessel ischemic disease. Calcified atherosclerosis at the skull base. ORBITS: Left cataract extraction. SINUSES: Persistent near complete opacification of the left frontal sinus. Clear mastoid air cells. SOFT TISSUES AND SKULL: Left parietal craniotomy. No acute soft tissue abnormality. No skull fracture. IMPRESSION: 1. No acute intracranial abnormality. 2. Mild chronic small vessel ischemic disease. Electronically signed by: Dasie Hamburg MD MD 01/25/2024 12:07 PM EST RP Workstation: [id number]   DG Chest 2 View Result Date: 01/25/2024 CLINICAL DATA:  Weakness, decreased appetite, dysuria. EXAM: CHEST - 2 VIEW COMPARISON:  08/11/2023 and CT chest 12/09/2023. FINDINGS: Trachea is midline. Heart size normal. New dense airspace consolidation in the right lower lobe with probable slight involvement of the right perihilar region. Tiny right pleural effusion. Biapical pleural thickening. Left lung is clear. IMPRESSION: 1. Right lower lobe pneumonia with a small right parapneumonic effusion. Followup PA and lateral chest X-ray is recommended in 3-4 weeks following trial of antibiotic therapy to ensure resolution and exclude underlying malignancy. 2. Probable mild involvement of the right perihilar region. Electronically Signed   By: Newell Eke M.D.   On: 01/25/2024 11:50    Microbiology: Results  for orders placed or performed during the hospital encounter of 01/25/24  Resp panel by RT-PCR (RSV, Flu A&B, Covid) Anterior Nasal Swab     Status: None   Collection Time: 01/25/24  2:06 PM   Specimen: Anterior Nasal Swab  Result Value Ref Range Status   SARS Coronavirus 2 by RT PCR NEGATIVE NEGATIVE Final    Comment: (NOTE) SARS-CoV-2 target nucleic acids are NOT DETECTED.  The SARS-CoV-2 RNA is generally detectable in upper respiratory specimens during the acute phase of infection. The lowest concentration of SARS-CoV-2 viral copies this assay can detect is 138 copies/mL. A negative result does not preclude SARS-Cov-2 infection and should not be used as the sole basis for treatment or other patient management decisions. A negative result may occur with  improper specimen collection/handling, submission of specimen other than nasopharyngeal swab, presence of viral mutation(s) within the areas targeted by this assay, and inadequate number of viral copies(<138 copies/mL). A negative result must be combined with clinical observations, patient history, and epidemiological information. The expected result is Negative.  Fact Sheet for Patients:  bloggercourse.com  Fact Sheet for Healthcare Providers:  seriousbroker.it  This test is no t yet approved or cleared by the [place]  FDA and  has been authorized for detection and/or diagnosis of SARS-CoV-2 by FDA under an Emergency Use Authorization (EUA). This EUA will remain  in effect (meaning this test can be used) for the duration of the COVID-19 declaration under Section 564(b)(1) of the Act, 21 U.S.C.section 360bbb-3(b)(1), unless the authorization is terminated  or revoked sooner.       Influenza A by PCR NEGATIVE NEGATIVE Final   Influenza B by PCR NEGATIVE NEGATIVE Final    Comment: (NOTE) The Xpert Xpress SARS-CoV-2/FLU/RSV plus assay is  intended as an aid in the diagnosis  of influenza from Nasopharyngeal swab specimens and should not be used as a sole basis for treatment. Nasal washings and aspirates are unacceptable for Xpert Xpress SARS-CoV-2/FLU/RSV testing.  Fact Sheet for Patients: bloggercourse.com  Fact Sheet for Healthcare Providers: seriousbroker.it  This test is not yet approved or cleared by the [place]  FDA and has been authorized for detection and/or diagnosis of SARS-CoV-2 by FDA under an Emergency Use Authorization (EUA). This EUA will remain in effect (meaning this test can be used) for the duration of the COVID-19 declaration under Section 564(b)(1) of the Act, 21 U.S.C. section 360bbb-3(b)(1), unless the authorization is terminated or revoked.     Resp Syncytial Virus by PCR NEGATIVE NEGATIVE Final    Comment: (NOTE) Fact Sheet for Patients: bloggercourse.com  Fact Sheet for Healthcare Providers: seriousbroker.it  This test is not yet approved or cleared by the [place]  FDA and has been authorized for detection and/or diagnosis of SARS-CoV-2 by FDA under an Emergency Use Authorization (EUA). This EUA will remain in effect (meaning this test can be used) for the duration of the COVID-19 declaration under Section 564(b)(1) of the Act, 21 U.S.C. section 360bbb-3(b)(1), unless the authorization is terminated or revoked.  Performed at [facility], [address] [place], [place] [address]   MRSA Next Gen by PCR, Nasal     Status: None   Collection Time: 01/25/24  6:24 PM   Specimen: Nasal Mucosa; Nasal Swab  Result Value Ref Range Status   MRSA by PCR Next Gen NOT DETECTED NOT DETECTED Final    Comment: (NOTE) The GeneXpert MRSA Assay (FDA approved for NASAL specimens only), is one component of a comprehensive MRSA colonization surveillance program. It is not intended to diagnose MRSA infection nor  to guide or monitor treatment for MRSA infections. Test performance is not FDA approved in patients less than 81 years old. Performed at [facility], [address] [place], [place] [address]     Labs: CBC: Recent Labs  Lab 01/30/24 0526 02/01/24 1338 02/02/24 0426 02/03/24 0456  WBC 6.5 3.9* 5.4 6.5  HGB 8.9* 10.6* 9.8* 10.9*  HCT 25.3* 29.8* 27.9* 31.9*  MCV 88.2 88.2 88.0 89.1  PLT 160 242 242 273   Basic Metabolic Panel: Recent Labs  Lab 01/30/24 0526 02/01/24 0513 02/01/24 1338 02/02/24 0426 02/03/24 0456  NA 132*  --  131* 136 133*  K 3.7  --  3.1* 3.8 4.6  CL 99  --  96* 99 99  CO2 26  --  25 27 20*  GLUCOSE 95  --  99 91 117*  BUN 6*  --  5* 5* 9  CREATININE 0.58* 0.59* 0.61 0.64 0.57*  CALCIUM  8.1*  --  8.6* 8.5* 8.6*  MG  --   --  1.3*  --   --    Liver Function Tests: No results for input(s): AST, ALT, ALKPHOS, BILITOT, PROT, ALBUMIN  in the last 168 hours. CBG: No results for input(s): GLUCAP in the last 168 hours.  Discharge time spent: 36 minutes.  Signed: Concepcion Riser, MD [facility] 02/05/2024 "

## 2024-02-05 NOTE — Progress Notes (Signed)
 "                                                                                                                                                                                                          Daily Progress Note   Patient Name: Noah King       Date: 02/05/2024 DOB: 11-29-1945  Age: 79 y.o. MRN#: [id number] Attending Physician: Darci Pore, MD Primary Care Physician: Merna Huxley, NP Admit Date: 01/25/2024  Reason for Consultation/Follow-up: Establishing goals of care  Subjective:  Resting in bed, awake alert, not agitated at present, able to mumble some words but I am unable to comprehend them, at present, no family at bedside.     Length of Stay: 10  Current Medications: Scheduled Meds:   feeding supplement  1 Container Oral TID WC   feeding supplement  237 mL Oral BID BM   folic acid   1 mg Oral Daily   guaiFENesin   600 mg Oral BID   magnesium  oxide  400 mg Oral BID   OLANZapine   2.5 mg Oral QHS   pantoprazole   40 mg Oral Daily   scopolamine   1 patch Transdermal Q72H    Continuous Infusions:  ampicillin -sulbactam (UNASYN ) IV 3 g (02/05/24 0740)    PRN Meds: acetaminophen  **OR** acetaminophen , albuterol , haloperidol  lactate, HYDROmorphone  (DILAUDID ) injection, LORazepam , prochlorperazine , senna-docusate  Physical Exam         Awake but confused mild distress Appears chronically ill Abdomen not distended Verbalizes some.  Regular work of breathing.   Vital Signs: BP (!) 141/87 (BP Location: Left Arm)   Pulse 84   Temp (!) 97.3 F (36.3 C) (Oral)   Resp 20   Ht 5' 10 (1.778 m)   Wt 74.9 kg   SpO2 91%   BMI 23.69 kg/m  SpO2: SpO2: 91 % O2 Device: O2 Device: Room Air O2 Flow Rate:    Intake/output summary:  Intake/Output Summary (Last 24 hours) at 02/05/2024 1121 Last data filed at 02/05/2024 1000 Gross per 24 hour  Intake 620 ml  Output 1125 ml  Net -505 ml   LBM: Last BM Date : 02/04/24 Baseline Weight: Weight: 74.9  kg Most recent weight: Weight: 74.9 kg       Palliative Assessment/Data:      Patient Active Problem List   Diagnosis Date Noted   Poor appetite 02/02/2024   Protein-calorie malnutrition, severe 01/30/2024   Encephalopathy 01/29/2024   Neutropenia 01/28/2024   Malignant neoplasm metastatic to brain [facility]) 01/27/2024   Counseling and coordination of care 01/27/2024  Palliative care encounter 01/27/2024   Pancytopenia due to chemotherapy 01/27/2024   Right lower lobe pneumonia 01/25/2024   GERD (gastroesophageal reflux disease) 01/25/2024   Metastasis to brain (HCC) 12/29/2023   Goals of care, counseling/discussion 12/24/2023   Malignant neoplasm of lower lobe of right lung (HCC) 09/10/2023   S/P craniotomy 08/21/2023   Pulmonary nodules 08/21/2023   Mediastinal lymphadenopathy 08/13/2023   Herpes zoster 12/13/2009   Polycythemia vera (HCC) 04/16/2007   Contracture of palmar fascia 04/16/2007   Hypothyroidism 08/29/2006   Hyperlipidemia 08/29/2006   Essential hypertension 08/29/2006    Palliative Care Assessment & Plan   Patient Profile: Symptom Management / Clinical Status The patient has severe aspiration risk due to poor oral intake, progressive weakness, and worsening delirium, placing him at high risk for continued clinical deterioration.  Plan:  Continue comfort-focused symptom management Maintain delirium precautions and close nursing support Avoid escalation of care that would be inconsistent with established goals Haldol  IV PRN for agitation IV Dilaudid  PRN for pain or dyspnea Continue nightly Zyprexa .  Goals of Care / Advance Care Planning  The patient does not have medical decision-making capacity. Goals of care were reviewed in detail at bedside with the patients daughter and ex-wife over the course of the past several days. The family confirms DNR/DNI status and a preference for discharge home with hospice services, with care focused on comfort,  dignity, and relief of symptoms. Non-beneficial or burdensome interventions are to be avoided.  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 Prior treatments have included craniotomy, stereotactic radiosurgery, and concurrent chemoradiation. Most recently, he began systemic therapy with carboplatin , pemetrexed , and pembrolizumab  on 01/12/2024. His hospitalization has been complicated by bilateral necrotizing pneumonia and a steady functional and cognitive decline despite appropriate oncologic and medical therapy. Oncology has discussed prognosis and current decline with the patients daughter, who agrees with limiting aggressive interventions.  Plan: Given the advanced metastatic disease, lack of meaningful recovery, and inability to participate in care, prognosis is poor. Recommend transition to home hospice.TOC and local hospice agency consulted.   Altered Mental Status / Progressive Neurocognitive Decline   79 year old man with advanced lung cancer and brain metastases, status post left craniotomy and stereotactic radiosurgery, now with progressive encephalopathy. While mental status had transiently improved earlier in the hospitalization, there has been an acute decline today characterized by inability to follow commands, medication pocketing and spitting, paranoia, and unsafe swallowing. MRI brain from 12/24/2023 showed a small enhancing left frontal lesion; repeat imaging on 01/30/2024 was limited by motion but without clear interval change in chronic microhemorrhages. Overall presentation is most consistent with irreversible neurologic decline in the context of advanced malignancy and systemic illness.  Plan:  Continue supportive, comfort-oriented neurologic care Avoid further diagnostic testing unlikely to alter management Follow SLP guidance with comfort feeding only Family  understands and accepts the high aspiration risk Proceed with plan for discharge home with hospice  Goals of Care and Additional Recommendations: Limitations on Scope of Treatment: Full Comfort Care  Code Status:    Code Status Orders  (From admission, onward)           Start     Ordered   02/02/24 1735  Do not attempt resuscitation (DNR) - Comfort care  (Code Status)  Continuous       Question Answer Comment  If patient has no pulse and is not breathing Do Not Attempt  Resuscitation   In Pre-Arrest Conditions (Patient Is Breathing and Has a Pulse) Provide comfort measures. Relieve any mechanical airway obstruction. Avoid transfer unless required for comfort.   Consent: Discussion documented in EHR or advanced directives reviewed      02/02/24 1736           Code Status History     Date Active Date Inactive Code Status Order ID Comments User Context   02/02/2024 1731 02/02/2024 1736 Limited: Do not attempt resuscitation (DNR) -DNR-LIMITED -Do Not Intubate/DNI  [id number]  Darci Pore, MD Inpatient   01/25/2024 1356 02/02/2024 1731 Full Code [id number]  Fausto Burnard LABOR, DO ED   08/21/2023 1856 08/23/2023 2147 Full Code [id number]  Johnanna Camie Mates, PA-C Inpatient   08/13/2023 1038 08/15/2023 1650 Full Code [id number]  Verdene Purchase, MD Inpatient      Advance Directive Documentation    Flowsheet Row Most Recent Value  Type of Advance Directive Living will, Healthcare Power of Attorney  Pre-existing out of facility DNR order (yellow form or pink MOST form) --  MOST Form in Place? --    Prognosis:  < 3 months  Discharge Planning: Home with Hospice  Care plan was discussed with  patient.  IDT  Thank you for allowing the Palliative Medicine Team to assist in the care of this patient.  I personally spent a total of 35 minutes in the care of the patient today including preparing to see the patient, getting/reviewing separately obtained history, performing a  medically appropriate exam/evaluation, independently interpreting results, and coordinating care.      Greater than 50%  of this time was spent counseling and coordinating care related to the above assessment and plan.  Lonia Serve, MD  Please contact Palliative Medicine Team phone at [phone] for questions and concerns.       "

## 2024-02-05 NOTE — Telephone Encounter (Signed)
 Pharmacy Patient Advocate Encounter  Received notification from [facility] that Prior Authorization for Scopolamine  1MG /3DAYS 72 hr patches has been APPROVED from 02/05/24 to 05/05/24   PA #/Case ID/Reference #: [id number]

## 2024-02-05 NOTE — Progress Notes (Signed)
 Discharge medications delivered to patient at the bedside, Name and DOB confirmed with Primary RN VEAR Gallus

## 2024-02-05 NOTE — Progress Notes (Signed)
 Discharge instructions placed in patient packet.

## 2024-02-09 ENCOUNTER — Ambulatory Visit
Admission: RE | Admit: 2024-02-09 | Discharge: 2024-02-09 | Disposition: A | Discharge status: Home / Self Care | Source: Ambulatory Visit | Attending: Radiation Oncology | Admitting: Radiation Oncology

## 2024-02-09 ENCOUNTER — Telehealth: Payer: Self-pay | Admitting: Internal Medicine

## 2024-02-09 DIAGNOSIS — C7931 Secondary malignant neoplasm of brain: Secondary | ICD-10-CM

## 2024-02-09 NOTE — Telephone Encounter (Signed)
 Called to touch base on the patient and to reschedule/cancel 1/27 appointments via 1/26 secure chat. Talked with the patients daughter Margit who stated that the patient was discharged on hospice and patient passed away shortly after. Appointments have been canceled and Cassie Heilingoetter PA has been notified via 1/26 secure chat.

## 2024-02-10 ENCOUNTER — Inpatient Hospital Stay: Admitting: Nurse Practitioner

## 2024-02-10 ENCOUNTER — Inpatient Hospital Stay

## 2024-02-10 ENCOUNTER — Inpatient Hospital Stay: Admitting: Dietician

## 2024-02-11 ENCOUNTER — Other Ambulatory Visit: Payer: Self-pay | Admitting: Adult Health

## 2024-02-11 ENCOUNTER — Telehealth: Payer: Self-pay | Admitting: Adult Health

## 2024-02-11 NOTE — Progress Notes (Signed)
 Received notifiation from [facility] that Lemitar passed away on February 17, 2024 at 4:54 pm.  Dx: Malignant neoplasm of lung.   I will touch base with family and offer my condolences.

## 2024-02-11 NOTE — Telephone Encounter (Signed)
 error

## 2024-02-15 DEATH — deceased

## 2024-02-23 ENCOUNTER — Inpatient Hospital Stay

## 2024-02-23 ENCOUNTER — Inpatient Hospital Stay: Admitting: Internal Medicine

## 2024-03-02 ENCOUNTER — Inpatient Hospital Stay

## 2024-03-02 ENCOUNTER — Inpatient Hospital Stay: Admitting: Physician Assistant

## 2024-03-15 ENCOUNTER — Inpatient Hospital Stay

## 2024-03-15 ENCOUNTER — Inpatient Hospital Stay: Admitting: Internal Medicine

## 2024-03-23 ENCOUNTER — Inpatient Hospital Stay

## 2024-03-23 ENCOUNTER — Inpatient Hospital Stay: Admitting: Internal Medicine

## 2024-04-01 ENCOUNTER — Other Ambulatory Visit

## 2024-04-05 ENCOUNTER — Inpatient Hospital Stay: Attending: Internal Medicine

## 2024-04-05 ENCOUNTER — Inpatient Hospital Stay

## 2024-04-08 ENCOUNTER — Inpatient Hospital Stay: Admitting: Internal Medicine

## 2024-04-13 ENCOUNTER — Inpatient Hospital Stay: Admitting: Physician Assistant

## 2024-04-13 ENCOUNTER — Inpatient Hospital Stay

## 2024-12-21 ENCOUNTER — Encounter: Admitting: Adult Health
